# Patient Record
Sex: Female | Born: 1985 | Race: Black or African American | Hispanic: No | Marital: Married | Smoking: Never smoker
Health system: Southern US, Community
[De-identification: ages and names within clinical notes are randomized; demographics above are authoritative.]

## PROBLEM LIST (undated history)

## (undated) DIAGNOSIS — C8190 Hodgkin lymphoma, unspecified, unspecified site: Secondary | ICD-10-CM

## (undated) DIAGNOSIS — R590 Localized enlarged lymph nodes: Secondary | ICD-10-CM

## (undated) DIAGNOSIS — E611 Iron deficiency: Secondary | ICD-10-CM

## (undated) DIAGNOSIS — G4730 Sleep apnea, unspecified: Secondary | ICD-10-CM

## (undated) DIAGNOSIS — R51 Headache: Secondary | ICD-10-CM

## (undated) DIAGNOSIS — C8100 Nodular lymphocyte predominant Hodgkin lymphoma, unspecified site: Secondary | ICD-10-CM

## (undated) DIAGNOSIS — F419 Anxiety disorder, unspecified: Secondary | ICD-10-CM

## (undated) DIAGNOSIS — R0600 Dyspnea, unspecified: Secondary | ICD-10-CM

## (undated) DIAGNOSIS — L732 Hidradenitis suppurativa: Secondary | ICD-10-CM

## (undated) DIAGNOSIS — I10 Essential (primary) hypertension: Secondary | ICD-10-CM

## (undated) HISTORY — PX: OTHER SURGICAL HISTORY: SHX169

## (undated) HISTORY — DX: Iron deficiency: E61.1

## (undated) HISTORY — PX: ADENOIDECTOMY: SHX5191

## (undated) HISTORY — DX: Nodular lymphocyte predominant Hodgkin lymphoma, unspecified site: C81.00

---
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
## 2003-01-03 ENCOUNTER — Ambulatory Visit (HOSPITAL_COMMUNITY): Admission: AD | Admit: 2003-01-03 | Discharge: 2003-01-03 | Payer: Self-pay | Admitting: Obstetrics and Gynecology

## 2003-01-28 ENCOUNTER — Inpatient Hospital Stay (HOSPITAL_COMMUNITY): Admission: AD | Admit: 2003-01-28 | Discharge: 2003-01-31 | Payer: Self-pay | Admitting: Obstetrics and Gynecology

## 2005-03-18 ENCOUNTER — Emergency Department (HOSPITAL_COMMUNITY): Admission: EM | Admit: 2005-03-18 | Discharge: 2005-03-18 | Payer: Self-pay | Admitting: Emergency Medicine

## 2005-05-03 ENCOUNTER — Emergency Department (HOSPITAL_COMMUNITY): Admission: EM | Admit: 2005-05-03 | Discharge: 2005-05-03 | Payer: Self-pay | Admitting: Emergency Medicine

## 2008-04-17 ENCOUNTER — Other Ambulatory Visit: Admission: RE | Admit: 2008-04-17 | Discharge: 2008-04-17 | Payer: Self-pay | Admitting: Obstetrics and Gynecology

## 2009-12-10 ENCOUNTER — Emergency Department (HOSPITAL_COMMUNITY): Admission: EM | Admit: 2009-12-10 | Discharge: 2009-12-10 | Payer: Self-pay | Admitting: Emergency Medicine

## 2010-07-18 DIAGNOSIS — I10 Essential (primary) hypertension: Secondary | ICD-10-CM

## 2010-07-18 HISTORY — DX: Essential (primary) hypertension: I10

## 2010-07-31 ENCOUNTER — Inpatient Hospital Stay (HOSPITAL_COMMUNITY)
Admission: AD | Admit: 2010-07-31 | Discharge: 2010-07-31 | Payer: Self-pay | Source: Home / Self Care | Attending: Obstetrics & Gynecology | Admitting: Obstetrics & Gynecology

## 2010-10-29 LAB — CBC
Hemoglobin: 12.4 g/dL (ref 12.0–15.0)
MCH: 28.4 pg (ref 26.0–34.0)
MCHC: 33.2 g/dL (ref 30.0–36.0)
MCV: 85.5 fL (ref 78.0–100.0)

## 2010-11-20 ENCOUNTER — Inpatient Hospital Stay (INDEPENDENT_AMBULATORY_CARE_PROVIDER_SITE_OTHER)
Admission: RE | Admit: 2010-11-20 | Discharge: 2010-11-20 | Disposition: A | Discharge status: Home / Self Care | Payer: 59 | Source: Ambulatory Visit | Attending: Emergency Medicine | Admitting: Emergency Medicine

## 2010-11-20 DIAGNOSIS — J029 Acute pharyngitis, unspecified: Secondary | ICD-10-CM

## 2010-11-20 DIAGNOSIS — B9789 Other viral agents as the cause of diseases classified elsewhere: Secondary | ICD-10-CM

## 2010-11-20 LAB — POCT RAPID STREP A (OFFICE): Streptococcus, Group A Screen (Direct): NEGATIVE

## 2011-01-03 NOTE — Op Note (Signed)
   NAMELILJA, SOLAND                          ACCOUNT NO.:  [id number]   MEDICAL RECORD NO.:  [id number]                   PATIENT TYPE:  INP   LOCATION:  A427                                 FACILITY:  [facility]   PHYSICIAN:  Tilda Burrow, M.D.              DATE OF BIRTH:  1986/02/14   DATE OF PROCEDURE:  01/29/2003  DATE OF DISCHARGE:                                 OPERATIVE REPORT   DELIVERY TIME:  7:20 p.m. on January 29, 2003.   DELIVERY NOTE:  Ms. Freida Busman progressed slowly through the day after epidural  analgesia achieved excellent relief.  She was able to sleep through some of  the labor.  Pitocin augmentation allowed for good labor.  Difficulty with  assessing the uterine intensity necessitated placement of an intrauterine  pressure catheter.  She progressed nicely, reaching completely dilated  approximately 5:30 p.m., and pushed for over an hour, reaching outlet  position.  She could not quite push the baby out completely, and vacuum  assistance was offered, accepted, and used through one contraction to bring  the baby easily through extension.  There was a small second degree  laceration.  There was a nuchal cord x1.  The left arm was grasped and  rotated anteriorly from its left anterior position and the left arm released  completely, then the baby expelled the rest of the way without difficulty.  The legs were in McRoberts position.  The patient had numerous support  persons in the room.  The cord was clamped and cut by the baby's father,  placenta delivered intact, Tomasa Blase presentation, after a three-vessel cord  confirmed.  Amniotic fluid was without malodor.  Estimated blood loss was  1000 mL.  The uterus continued tendency to ooze, requiring uterine massage,  IV oxytocin, and intramuscular Hemabate.  A medium second-degree laceration  was repaired using figure-of-eight suture of 2-0 Dexon.  The patient  tolerated the procedure well.  The epidural catheter was removed  with the  tip visualized as intact.                                               Tilda Burrow, M.D.    JVF/MEDQ  D:  01/29/2003  T:  01/30/2003  Job:  [id number]   cc:   [facility]

## 2011-01-03 NOTE — Op Note (Signed)
   [name], [name]                          ACCOUNT NO.:  [id number]   MEDICAL RECORD NO.:  [id number]                   PATIENT TYPE:  INP   LOCATION:  LDR1                                 FACILITY:  [facility]   PHYSICIAN:  Tilda Burrow, M.D.              DATE OF BIRTH:  01-16-1986   DATE OF PROCEDURE:  DATE OF DISCHARGE:                                  PROCEDURE NOTE   PROCEDURE:  Epidural catheter placement.   SURGEON:  Tilda Burrow, M.D.   DESCRIPTION OF PROCEDURE:  The patient had epidural catheter placed at L3-4  interspace.  After consent was obtained, the patient was positioned in the  usual sitting position and flexed forward and local anesthesia applied.  We  first attempted at L2-3 interspace without success and could not identify  the access past the bony prominences.  The patient has a marked lumbar  lordosis and so, therefore, the procedure was technically challenging.  The  ankle was quite cephalad for the needle placement, but we were successful on  first attempt at L3-4 interspace, identifying the epidural space at a needle  depth of about 7 cm.   The catheter was threaded 2.5 cm into the epidural space, taped onto the  back and infusion performed as per usual; with the test dose at bolus.  The  patient tolerated the procedure and got excellent analgesic level of C10.                                               Tilda Burrow, M.D.    JVF/MEDQ  D:  01/29/2003  T:  01/29/2003  Job:  [id number]

## 2011-01-03 NOTE — H&P (Signed)
[name], [name]                          ACCOUNT NO.:  [id number]   MEDICAL RECORD NO.:  [id number]                   PATIENT TYPE:  INP   LOCATION:  LDR1                                 FACILITY:  [facility]   PHYSICIAN:  Tilda Burrow, M.D.              DATE OF BIRTH:  [birth year]   DATE OF ADMISSION:  01/28/2003  DATE OF DISCHARGE:                                HISTORY & PHYSICAL   ADMISSION DIAGNOSES:  1. Pregnancy 40-1/[redacted] weeks gestation.  2. Latent phase labor.   HISTORY OF PRESENT ILLNESS:  This 25 year old female, gravida 2, para 0, AB  1, LMP May 11, 2002 placing menstrual [name] February 16, 2003; with  ultrasound corrected [facility] based on 19-week ultrasound as January 26, 2003 with  corresponding later trimester scan.  She is admitted at 40-1/2 weeks by  ultrasound criteria after a pregnancy course followed through our office  since January with 33 pound weight gain and appropriate fundal height  growth.  Prenatal labs documented in the prenatal record and notable for  blood type A positive, UDS negative, hepatitis, HIV, GC, Chlamydia, and RPR  all negative.  MSAFP normal at 1:10,000.  Group B Strep negative and glucose  tolerance test barely abnormal on 1-hour test at 145 mg% with normal 3-hour  test.  She is admitted, in the middle of the night with cervix 3-4 cm, 100%,  minus 2, vertex presentation.  She has progressed slowly, required  analgesics x1 which knocked out her labor.  At this time at 10:30 a.m.  amniotomy is performed revealing clear amniotic fluid; and Pitocin will be  initiated.  Presenting part is at a minus 2 station with cervix well applied  at 2-3 cm, 100% effaced, minus 2 to minus 3 station.   PAST MEDICAL HISTORY:  Positive for asthma.  Not on any medicines at this  time.   SURGICAL HISTORY:  1. Breast reduction 2001.  2. D&C for miscarriage 2002.   ALLERGIES:  None known.   Cigarettes, alcohol, recreational drugs denied.   SOCIAL HISTORY:   Single, supportive father of the baby [name] age  69).  The patient plans to bottle feed.  Is considering IUD for  contraception and will take baby to [facility].   PHYSICAL EXAMINATION:  VITAL SIGNS:  Height 5 feet 1 inch.  Weight 222 which  is a 33 pound weight gain.  ABDOMEN:  Fundal height 41 cm.  PELVIC: Cervix 2-3, 100% minus 2 to minus 3 vertex with amniotomy performed  and scalp electrode in place.  Fetal heart rate tracing showed diminished  beat-to-beat after the analgesics, which has recovered nicely now.  There is  no suspicion of late decelerations and only occasional fetal monitoring  variable, not clinical significant at this time.    IMPRESSION:  1. Pregnancy, term.  2. Latent phase labor, inadequate labor to date.   PLAN:  Pitocin augmentation.  May require epidural for analgesia.                                               Tilda Burrow, M.D.    JVF/MEDQ  D:  01/29/2003  T:  01/29/2003  Job:  [id number]   cc:   [facility]   Francoise Schaumann. Halm, D.O.  [address]  [place]  [place] [address]  Fax: [phone]

## 2011-05-10 ENCOUNTER — Inpatient Hospital Stay (HOSPITAL_COMMUNITY): Admission: AD | Admit: 2011-05-10 | Payer: 59 | Source: Ambulatory Visit | Admitting: Obstetrics & Gynecology

## 2011-06-17 ENCOUNTER — Other Ambulatory Visit (HOSPITAL_COMMUNITY)
Admission: RE | Admit: 2011-06-17 | Discharge: 2011-06-17 | Disposition: A | Discharge status: Home / Self Care | Payer: 59 | Source: Ambulatory Visit | Attending: Obstetrics & Gynecology | Admitting: Obstetrics & Gynecology

## 2011-06-17 ENCOUNTER — Other Ambulatory Visit: Payer: Self-pay | Admitting: Obstetrics & Gynecology

## 2011-06-17 DIAGNOSIS — Z01419 Encounter for gynecological examination (general) (routine) without abnormal findings: Secondary | ICD-10-CM | POA: Insufficient documentation

## 2011-06-17 DIAGNOSIS — Z113 Encounter for screening for infections with a predominantly sexual mode of transmission: Secondary | ICD-10-CM | POA: Insufficient documentation

## 2011-08-19 NOTE — L&D Delivery Note (Signed)
Delivery Note At 6:36 PM a viable female was delivered SVD, with one nuchal cord, tight.  APGAR: 8, 9; weight - to be checked in one hour .   Placenta status: intact , .  Cord:  Three vessel. with the following complications: none.  Cord pH: not obtained.   Anesthesia:  Epidural Episiotomy: not indicated Lacerations: 2: 1st degree anterior lacerations Suture Repair: not needed Est. Blood Loss (mL): 400 cc's  Mom to postpartum.  Baby to nursery-stable.  Edd Arbour MD 12/12/2011, 6:55 PM  I attended the delivery of this patient and precepted Dr Rivka Safer.  I agree with the above note.  Candelaria Celeste JEHIEL 12/13/2011 12:21 AM

## 2011-12-11 ENCOUNTER — Encounter (HOSPITAL_COMMUNITY): Payer: Self-pay | Admitting: *Deleted

## 2011-12-11 ENCOUNTER — Inpatient Hospital Stay (HOSPITAL_COMMUNITY)
Admission: AD | Admit: 2011-12-11 | Discharge: 2011-12-14 | DRG: 774 | Disposition: A | Discharge status: Home / Self Care | Payer: Medicaid Other | Source: Ambulatory Visit | Attending: Obstetrics & Gynecology | Admitting: Obstetrics & Gynecology

## 2011-12-11 DIAGNOSIS — O1410 Severe pre-eclampsia, unspecified trimester: Secondary | ICD-10-CM | POA: Diagnosis present

## 2011-12-11 DIAGNOSIS — I10 Essential (primary) hypertension: Secondary | ICD-10-CM

## 2011-12-11 DIAGNOSIS — IMO0002 Reserved for concepts with insufficient information to code with codable children: Secondary | ICD-10-CM

## 2011-12-11 DIAGNOSIS — O10919 Unspecified pre-existing hypertension complicating pregnancy, unspecified trimester: Secondary | ICD-10-CM | POA: Diagnosis present

## 2011-12-11 HISTORY — DX: Essential (primary) hypertension: I10

## 2011-12-11 HISTORY — DX: Headache: R51

## 2011-12-11 LAB — OB RESULTS CONSOLE ABO/RH: RH Type: POSITIVE

## 2011-12-11 LAB — CBC
HCT: 33.8 % — ABNORMAL LOW (ref 36.0–46.0)
MCH: 25.8 pg — ABNORMAL LOW (ref 26.0–34.0)
MCV: 79.9 fL (ref 78.0–100.0)
RBC: 4.23 MIL/uL (ref 3.87–5.11)
WBC: 13.9 10*3/uL — ABNORMAL HIGH (ref 4.0–10.5)

## 2011-12-11 LAB — OB RESULTS CONSOLE HEPATITIS B SURFACE ANTIGEN: Hepatitis B Surface Ag: NEGATIVE

## 2011-12-11 LAB — OB RESULTS CONSOLE ANTIBODY SCREEN: Antibody Screen: NEGATIVE

## 2011-12-11 MED ORDER — ZOLPIDEM TARTRATE 10 MG PO TABS
10.0000 mg | ORAL_TABLET | Freq: Every evening | ORAL | Status: DC | PRN
  Filled 2011-12-11: qty 1

## 2011-12-11 MED ORDER — CITRIC ACID-SODIUM CITRATE 334-500 MG/5ML PO SOLN: 30.0000 mL | ORAL | Status: DC | PRN

## 2011-12-11 MED ORDER — PHENYLEPHRINE 40 MCG/ML (10ML) SYRINGE FOR IV PUSH (FOR BLOOD PRESSURE SUPPORT)
80.0000 ug | PREFILLED_SYRINGE | INTRAVENOUS | Status: DC | PRN
Start: 2011-12-11 — End: 2011-12-12

## 2011-12-11 MED ORDER — LABETALOL HCL 200 MG PO TABS
200.0000 mg | ORAL_TABLET | Freq: Two times a day (BID) | ORAL | Status: DC
  Administered 2011-12-11 – 2011-12-12 (×2): 200 mg via ORAL
  Filled 2011-12-11 (×4): qty 1

## 2011-12-11 MED ORDER — FENTANYL 2.5 MCG/ML BUPIVACAINE 1/10 % EPIDURAL INFUSION (WH - ANES)
14.0000 mL/h | INTRAMUSCULAR | Status: DC
  Administered 2011-12-12: 14 mL/h via EPIDURAL
  Filled 2011-12-11 (×2): qty 60

## 2011-12-11 MED ORDER — IBUPROFEN 600 MG PO TABS
600.0000 mg | ORAL_TABLET | Freq: Four times a day (QID) | ORAL | Status: DC | PRN
  Administered 2011-12-12: 600 mg via ORAL
  Filled 2011-12-11: qty 1

## 2011-12-11 MED ORDER — MISOPROSTOL 25 MCG QUARTER TABLET
25.0000 ug | ORAL_TABLET | ORAL | Status: DC | PRN
  Administered 2011-12-11 (×2): 25 ug via VAGINAL
  Filled 2011-12-11 (×2): qty 0.25

## 2011-12-11 MED ORDER — PENICILLIN G POTASSIUM 5000000 UNITS IJ SOLR
2.5000 10*6.[IU] | INTRAVENOUS | Status: DC
  Administered 2011-12-12 (×5): 2.5 10*6.[IU] via INTRAVENOUS
  Filled 2011-12-11 (×8): qty 2.5

## 2011-12-11 MED ORDER — TERBUTALINE SULFATE 1 MG/ML IJ SOLN: 0.2500 mg | Freq: Once | INTRAMUSCULAR | Status: AC | PRN

## 2011-12-11 MED ORDER — EPHEDRINE 5 MG/ML INJ: 10.0000 mg | INTRAVENOUS | Status: DC | PRN

## 2011-12-11 MED ORDER — ONDANSETRON HCL 4 MG/2ML IJ SOLN: 4.0000 mg | Freq: Four times a day (QID) | INTRAMUSCULAR | Status: DC | PRN

## 2011-12-11 MED ORDER — OXYTOCIN 20 UNITS IN LACTATED RINGERS INFUSION - SIMPLE
125.0000 mL/h | Freq: Once | INTRAVENOUS | Status: AC
  Administered 2011-12-12: 125 mL/h via INTRAVENOUS

## 2011-12-11 MED ORDER — ACETAMINOPHEN 325 MG PO TABS: 650.0000 mg | ORAL_TABLET | ORAL | Status: DC | PRN

## 2011-12-11 MED ORDER — LIDOCAINE HCL (PF) 1 % IJ SOLN
30.0000 mL | INTRAMUSCULAR | Status: DC | PRN
  Filled 2011-12-11: qty 30

## 2011-12-11 MED ORDER — OXYTOCIN BOLUS FROM INFUSION
500.0000 mL | Freq: Once | INTRAVENOUS | Status: DC
  Filled 2011-12-11: qty 500

## 2011-12-11 MED ORDER — MAGNESIUM SULFATE BOLUS VIA INFUSION
4.0000 g | Freq: Once | INTRAVENOUS | Status: AC
  Administered 2011-12-11: 4 g via INTRAVENOUS
  Filled 2011-12-11: qty 500

## 2011-12-11 MED ORDER — LACTATED RINGERS IV SOLN
INTRAVENOUS | Status: DC
  Administered 2011-12-11 – 2011-12-12 (×2): via INTRAVENOUS

## 2011-12-11 MED ORDER — LACTATED RINGERS IV SOLN
500.0000 mL | Freq: Once | INTRAVENOUS | Status: AC
  Administered 2011-12-12: 1000 mL via INTRAVENOUS

## 2011-12-11 MED ORDER — HYDROXYZINE HCL 50 MG PO TABS: 50.0000 mg | ORAL_TABLET | Freq: Four times a day (QID) | ORAL | Status: DC | PRN

## 2011-12-11 MED ORDER — PENICILLIN G POTASSIUM 5000000 UNITS IJ SOLR
5.0000 10*6.[IU] | Freq: Once | INTRAVENOUS | Status: AC
  Administered 2011-12-11: 5 10*6.[IU] via INTRAVENOUS
  Filled 2011-12-11: qty 5

## 2011-12-11 MED ORDER — FLEET ENEMA 7-19 GM/118ML RE ENEM: 1.0000 | ENEMA | RECTAL | Status: DC | PRN

## 2011-12-11 MED ORDER — DIPHENHYDRAMINE HCL 50 MG/ML IJ SOLN: 12.5000 mg | INTRAMUSCULAR | Status: DC | PRN

## 2011-12-11 MED ORDER — MAGNESIUM SULFATE 40 G IN LACTATED RINGERS - SIMPLE
2.0000 g/h | INTRAVENOUS | Status: DC
  Administered 2011-12-12: 2 g/h via INTRAVENOUS
  Filled 2011-12-11 (×2): qty 500

## 2011-12-11 MED ORDER — EPHEDRINE 5 MG/ML INJ
10.0000 mg | INTRAVENOUS | Status: DC | PRN
  Filled 2011-12-11: qty 4

## 2011-12-11 MED ORDER — OXYCODONE-ACETAMINOPHEN 5-325 MG PO TABS: 1.0000 | ORAL_TABLET | ORAL | Status: DC | PRN

## 2011-12-11 MED ORDER — HYDROXYZINE HCL 50 MG/ML IM SOLN: 50.0000 mg | Freq: Four times a day (QID) | INTRAMUSCULAR | Status: DC | PRN

## 2011-12-11 MED ORDER — LACTATED RINGERS IV SOLN: 500.0000 mL | INTRAVENOUS | Status: DC | PRN

## 2011-12-11 MED ORDER — PHENYLEPHRINE 40 MCG/ML (10ML) SYRINGE FOR IV PUSH (FOR BLOOD PRESSURE SUPPORT)
80.0000 ug | PREFILLED_SYRINGE | INTRAVENOUS | Status: DC | PRN
  Filled 2011-12-11: qty 5

## 2011-12-11 NOTE — H&P (Signed)
Obstetric History and Physical  Arlisa Leclere Buick is a 26 y.o. G3P1011 with IUP at [redacted]w[redacted]d presenting for induction of labor for chronic hypertension with superimposed preeclampsia. Patient states she has been having severe headaches and seeing spots in her vision for a few days, was evaluated in the office today and was noted to have a BP of 140s/90s, and a recent 24  Hour urine protein was 357 mg.  Given the concern for severe preeclampsia, the plan was to admit her for induction of labor.  She denies any contractions, LOF, VB, RUQ pain and endorses good fetal movement.  Prenatal Course Source of Care: [facility] with onset of care at 7 weeks Pregnancy complications or risks: Patient Active Problem List  Diagnoses  . Hypertension in pregnancy, preeclampsia, severe, antepartum  . Chronic hypertension complicating or reason for care during pregnancy   She plans to breastfeed She is undecided for postpartum contraception.   Prenatal labs and studies: ABO, Rh: A/Positive/-- (04/25 1841) Antibody: Negative (04/25 1841) Rubella: Immune (04/25 1841) RPR: Nonreactive (04/25 1841)  HBsAg: Negative (04/25 1841)  HIV: Non-reactive (04/25 1841)  GBS: unknown 2 hr Glucola   71-121-78 Genetic screening normal Anatomy US normal  Past Medical History:  Past Medical History  Diagnosis Date  . Hypertension December 2011    Been on Aldomet since Dec. 2011  . Headache     usually controlled c Tylenol when not pregnant, but more painful headaches  during preg. due to hypertension.  . Asthma     had since childhood; has not had to use inhaler or nebulizer for 8-9 years. Been doing well.   Past Surgical History:  Past Surgical History  Procedure Date  . Breast reconstruction December 2001    breast reduction at age 33  . Adenoidectomy     age 69   Obstetrical History:  OB History    Grav Para Term Preterm Abortions TAB SAB Ect Mult Living   3 1 1  0 1 0 1 0 0 1     Gynecological History:   Patient denies any cervical dysplasia or STIs.  Social History:  History   Social History  . Marital Status: Married    Spouse Name: N/A    Number of Children: N/A  . Years of Education: N/A   Social History Main Topics  . Smoking status: Never Smoker   . Smokeless tobacco: Never Used  . Alcohol Use: No  . Drug Use: No  . Sexually Active: Yes   Other Topics Concern  . None   Social History Narrative  . None   Family History: History reviewed. No pertinent family history.  Medications:  Prenatal vitamins  Allergies: No Known Allergies  Review of Systems: Negative except for what is mentioned in HPI.  Physical Exam: Blood pressure 142/80, pulse 117, temperature 98.1 F (36.7 C), temperature source Oral, resp. rate 20, height 5\' 3"  (1.6 m), weight 122.925 kg (271 lb), last menstrual period 03/27/2011. GENERAL: Well-developed, well-nourished female in no acute distress.  LUNGS: Clear to auscultation bilaterally.  HEART: Regular rate and rhythm. ABDOMEN: Soft, nontender, nondistended, gravid. EXTREMITIES: Nontender, no edema, 2+DTRs Cervical Exam: Dilatation 0cm   Effacement 0%   Station high   Presentation: cephalic FHT:  Baseline rate 150 bpm   Variability moderate  Accelerations present   Decelerations none Contractions: None   Pertinent Labs/Studies:   CBC     Status: Abnormal   12/11/11  7:15 PM      Component  Value Range   WBC 13.9 (*) 4.0 - 10.5 (K/uL)   RBC 4.23  3.87 - 5.11 (MIL/uL)   Hemoglobin 10.9 (*) 12.0 - 15.0 (g/dL)   HCT 01.0 (*) 27.2 - 46.0 (%)   MCV 79.9  78.0 - 100.0 (fL)   MCH 25.8 (*) 26.0 - 34.0 (pg)   MCHC 32.2  30.0 - 36.0 (g/dL)   RDW 53.6  64.4 - 03.4 (%)   Platelets 351  150 - 400 (K/uL)   Assessment : ILARIA MUCH is a 26 y.o. G3P1011 at [redacted]w[redacted]d being admitted for induction of labor for chronic hypertension with superimposed preeclampsia.  Plan: Labor:  Induction as per protocol with misoprostol, foley bulb, pitocin  etc Preeclampsia: Will start magnesium sulfate eclampsia prophylaxis and give further antihypertensives as needed. FWB: Reassuring fetal heart tracing.  GBS unknown; will treat with PCN for prematurity Delivery plan: Hopeful for vaginal delivery   Jaynie Collins, M.D. 12/11/2011 6:30 PM

## 2011-12-12 ENCOUNTER — Encounter (HOSPITAL_COMMUNITY): Payer: Self-pay | Admitting: Anesthesiology

## 2011-12-12 ENCOUNTER — Inpatient Hospital Stay (HOSPITAL_COMMUNITY): Payer: Medicaid Other | Admitting: Anesthesiology

## 2011-12-12 ENCOUNTER — Encounter (HOSPITAL_COMMUNITY): Payer: Self-pay | Admitting: *Deleted

## 2011-12-12 LAB — CBC
Hemoglobin: 11.2 g/dL — ABNORMAL LOW (ref 12.0–15.0)
Hemoglobin: 11.2 g/dL — ABNORMAL LOW (ref 12.0–15.0)
MCH: 25.6 pg — ABNORMAL LOW (ref 26.0–34.0)
MCHC: 33.1 g/dL (ref 30.0–36.0)
MCV: 80.1 fL (ref 78.0–100.0)
Platelets: 359 10*3/uL (ref 150–400)
Platelets: 385 10*3/uL (ref 150–400)
RBC: 4.38 MIL/uL (ref 3.87–5.11)
RDW: 15 % (ref 11.5–15.5)
WBC: 13.9 10*3/uL — ABNORMAL HIGH (ref 4.0–10.5)

## 2011-12-12 LAB — MRSA PCR SCREENING: MRSA by PCR: NEGATIVE

## 2011-12-12 MED ORDER — DIBUCAINE 1 % RE OINT: 1.0000 "application " | TOPICAL_OINTMENT | RECTAL | Status: DC | PRN

## 2011-12-12 MED ORDER — HYDRALAZINE HCL 20 MG/ML IJ SOLN: 10.0000 mg | Freq: Four times a day (QID) | INTRAMUSCULAR | Status: DC | PRN

## 2011-12-12 MED ORDER — PRENATAL MULTIVITAMIN CH
1.0000 | ORAL_TABLET | Freq: Every day | ORAL | Status: DC
  Administered 2011-12-13 – 2011-12-14 (×2): 1 via ORAL
  Filled 2011-12-12 (×2): qty 1

## 2011-12-12 MED ORDER — BENZOCAINE-MENTHOL 20-0.5 % EX AERO: 1.0000 "application " | INHALATION_SPRAY | CUTANEOUS | Status: DC | PRN

## 2011-12-12 MED ORDER — ZOLPIDEM TARTRATE 10 MG PO TABS
10.0000 mg | ORAL_TABLET | Freq: Every evening | ORAL | Status: DC | PRN
  Administered 2011-12-12: 10 mg via ORAL

## 2011-12-12 MED ORDER — ONDANSETRON HCL 4 MG PO TABS: 4.0000 mg | ORAL_TABLET | ORAL | Status: DC | PRN

## 2011-12-12 MED ORDER — OXYTOCIN 20 UNITS IN LACTATED RINGERS INFUSION - SIMPLE
1.0000 m[IU]/min | INTRAVENOUS | Status: DC
  Administered 2011-12-12: 2 m[IU]/min via INTRAVENOUS
  Filled 2011-12-12: qty 1000

## 2011-12-12 MED ORDER — WITCH HAZEL-GLYCERIN EX PADS: 1.0000 "application " | MEDICATED_PAD | CUTANEOUS | Status: DC | PRN

## 2011-12-12 MED ORDER — ONDANSETRON HCL 4 MG/2ML IJ SOLN: 4.0000 mg | INTRAMUSCULAR | Status: DC | PRN

## 2011-12-12 MED ORDER — DIPHENHYDRAMINE HCL 25 MG PO CAPS: 25.0000 mg | ORAL_CAPSULE | Freq: Four times a day (QID) | ORAL | Status: DC | PRN

## 2011-12-12 MED ORDER — IBUPROFEN 600 MG PO TABS
600.0000 mg | ORAL_TABLET | Freq: Four times a day (QID) | ORAL | Status: DC
  Administered 2011-12-12 – 2011-12-14 (×8): 600 mg via ORAL
  Filled 2011-12-12 (×8): qty 1

## 2011-12-12 MED ORDER — SENNOSIDES-DOCUSATE SODIUM 8.6-50 MG PO TABS
2.0000 | ORAL_TABLET | Freq: Every day | ORAL | Status: DC
  Administered 2011-12-13: 2 via ORAL

## 2011-12-12 MED ORDER — LABETALOL HCL 200 MG PO TABS
200.0000 mg | ORAL_TABLET | Freq: Two times a day (BID) | ORAL | Status: DC
  Administered 2011-12-12: 200 mg via ORAL
  Filled 2011-12-12 (×2): qty 1

## 2011-12-12 MED ORDER — FENTANYL 2.5 MCG/ML BUPIVACAINE 1/10 % EPIDURAL INFUSION (WH - ANES)
INTRAMUSCULAR | Status: DC | PRN
  Administered 2011-12-12: 14 mL/h via EPIDURAL

## 2011-12-12 MED ORDER — ZOLPIDEM TARTRATE 5 MG PO TABS: 5.0000 mg | ORAL_TABLET | Freq: Every evening | ORAL | Status: DC | PRN

## 2011-12-12 MED ORDER — OXYCODONE-ACETAMINOPHEN 5-325 MG PO TABS
1.0000 | ORAL_TABLET | ORAL | Status: DC | PRN
  Administered 2011-12-12 – 2011-12-14 (×3): 1 via ORAL
  Filled 2011-12-12 (×3): qty 1

## 2011-12-12 MED ORDER — SIMETHICONE 80 MG PO CHEW: 80.0000 mg | CHEWABLE_TABLET | ORAL | Status: DC | PRN

## 2011-12-12 MED ORDER — TERBUTALINE SULFATE 1 MG/ML IJ SOLN: 0.2500 mg | Freq: Once | INTRAMUSCULAR | Status: DC | PRN

## 2011-12-12 MED ORDER — TETANUS-DIPHTH-ACELL PERTUSSIS 5-2.5-18.5 LF-MCG/0.5 IM SUSP
0.5000 mL | Freq: Once | INTRAMUSCULAR | Status: AC
  Administered 2011-12-13: 0.5 mL via INTRAMUSCULAR
  Filled 2011-12-12: qty 0.5

## 2011-12-12 MED ORDER — PRENATAL MULTIVITAMIN CH: 1.0000 | ORAL_TABLET | Freq: Every day | ORAL | Status: DC

## 2011-12-12 MED ORDER — FENTANYL CITRATE 0.05 MG/ML IJ SOLN
50.0000 ug | INTRAMUSCULAR | Status: DC | PRN
  Administered 2011-12-12 (×3): 50 ug via INTRAVENOUS
  Filled 2011-12-12 (×3): qty 2

## 2011-12-12 MED ORDER — LANOLIN HYDROUS EX OINT: TOPICAL_OINTMENT | CUTANEOUS | Status: DC | PRN

## 2011-12-12 MED ORDER — LIDOCAINE HCL (PF) 1 % IJ SOLN
INTRAMUSCULAR | Status: DC | PRN
  Administered 2011-12-12 (×2): 4 mL

## 2011-12-12 NOTE — Progress Notes (Signed)
[name] is a 26 y.o. G3P1011 at [redacted]w[redacted]d admitted for induction of labor due to Pre-eclamptic toxemia of pregnancy..  Subjective: Doing well. S/p epidural/arom x2 No complaints.   Objective: BP 121/71  Pulse 97  Temp(Src) 97.5 F (36.4 C) (Oral)  Resp 18  Ht 5\' 3"  (1.6 m)  Wt 122.925 kg (271 lb)  BMI 48.01 kg/m2  SpO2 96%  LMP 03/27/2011 I/O last 3 completed shifts: In: 2801.3 [P.O.:970; I.V.:1581.3; IV Piggyback:250] Out: 1475 [Urine:1475] Total I/O In: 1724.4 [P.O.:510; I.V.:1214.4] Out: 1075 [Urine:1075]  FHT:  FHR: 140 bpm, variability: moderate,  accelerations:  Present,  decelerations:  Absent UC:   regular, every 3 minutes SVE:   Dilation: 8 Effacement (%): 90 Station: 0 Exam by:: Dr. Rivka Safer AROM 12:00 pm Clear fluid.  Palpated a second bulging bag, repeat AROM at 4:19 pm - a lot more fluid came out this time, palpated head well.  Clear fluid.  Reflexes: 1+ left radial.  Labs: Lab Results  Component Value Date   WBC 13.9* 12/12/2011   HGB 11.2* 12/12/2011   HCT 35.1* 12/12/2011   MCV 80.1 12/12/2011   PLT 359 12/12/2011    Assessment / Plan: Induction of labor due to preeclampsia,  progressing well on pitocin  Labor: Progressing on Pitocin, s/p arom 4 hours, with repeat arom for second bag.  Preeclampsia:  no signs or symptoms of toxicity Fetal Wellbeing:  Category I Pain Control:  Epidural I/D:  n/a Anticipated MOD:  NSVD  [name] MD 12/12/2011, 4:22 PM

## 2011-12-12 NOTE — Progress Notes (Signed)
[name] is a 26 y.o. G3P1011 at [redacted]w[redacted]d admitted for induction of labor due to Pre-eclamptic toxemia of pregnancy.  Subjective: Pressure  Objective: BP 127/85  Pulse 103  Temp(Src) 97.5 F (36.4 C) (Oral)  Resp 18  Ht 5\' 3"  (1.6 m)  Wt 122.925 kg (271 lb)  BMI 48.01 kg/m2  SpO2 96%  LMP 03/27/2011 I/O last 3 completed shifts: In: 2801.3 [P.O.:970; I.V.:1581.3; IV Piggyback:250] Out: 1475 [Urine:1475] Total I/O In: 2034.4 [P.O.:630; I.V.:1404.4] Out: 1400 [Urine:1400]  FHT:  FHR: 140 bpm, variability: moderate,  accelerations:  Present,  decelerations:  Absent UC:   regular, every 3 minutes SVE:    Dilation: complete Effacement (%): 100 Cervical Position: Anterior Station: +1 Presentation: Vertex Exam by:: Dr. Rivka Safer  AROM 12:00/4:00 pm Clear fluid.  Labs: Lab Results  Component Value Date   WBC 13.9* 12/12/2011   HGB 11.2* 12/12/2011   HCT 35.1* 12/12/2011   MCV 80.1 12/12/2011   PLT 359 12/12/2011    Assessment / Plan: Induction of labor due to preeclampsia,  progressing well on pitocin  Labor: Progressing on Pitocin, s/p arom 4 hours, with repeat arom for second bag.  Now starting to push.  Preeclampsia:  no signs or symptoms of toxicity Fetal Wellbeing:  Category I Pain Control:  Epidural I/D:  n/a Anticipated MOD:  NSVD  Cervix complete - Nurse Erskine Squibb pushing - when closer will head back to room for delivery.   Edd Arbour MD 12/12/2011, 6:17 PM

## 2011-12-12 NOTE — Progress Notes (Signed)
Delivery Note At 6:36 PM a viable female was delivered SVD, with one nuchal cord, tight.  APGAR: 8, 9; weight - to be checked in one hour .   Placenta status: intact , .  Cord:  Three vessel. with the following complications: none.  Cord pH: not obtained.   Anesthesia:  Epidural Episiotomy: not indicated Lacerations: 2: 1st degree anterior lacerations Suture Repair: not needed Est. Blood Loss (mL): 400 cc's  Mom to postpartum.  Baby to nursery-stable.  Edd Arbour MD 12/12/2011, 6:55 PM

## 2011-12-12 NOTE — Anesthesia Procedure Notes (Signed)
Epidural Patient location during procedure: OB Start time: 12/12/2011 11:09 AM  Staffing Anesthesiologist: Elly Haffey A. Performed by: anesthesiologist   Preanesthetic Checklist Completed: patient identified, site marked, surgical consent, pre-op evaluation, timeout performed, IV checked, risks and benefits discussed and monitors and equipment checked  Epidural Patient position: sitting Prep: site prepped and draped and DuraPrep Patient monitoring: continuous pulse ox and blood pressure Approach: midline Injection technique: LOR air  Needle:  Needle type: Tuohy  Needle gauge: 17 G Needle length: 9 cm Needle insertion depth: 8 cm Catheter type: closed end flexible Catheter size: 19 Gauge Catheter at skin depth: 13 cm Test dose: negative and Other  Assessment Events: blood not aspirated, injection not painful, no injection resistance, negative IV test and no paresthesia  Additional Notes Patient identified. Risks and benefits discussed including failed block, incomplete  Pain control, post dural puncture headache, nerve damage, paralysis, blood pressure Changes, nausea, vomiting, reactions to medications-both toxic and allergic and post Partum back pain. All questions were answered. Patient expressed understanding and wished to proceed. Sterile technique was used throughout procedure. Epidural site was Dressed with sterile barrier dressing. No paresthesias, signs of intravascular injection Or signs of intrathecal spread were encountered. Attempt x 2. Difficult due to poor positioning and MO. Patient was more comfortable after the epidural was dosed. Please see RN's note for documentation of vital signs and FHR which are stable.

## 2011-12-12 NOTE — Progress Notes (Signed)
   [name] is a 26 y.o. G3P1011 at [redacted]w[redacted]d  admitted for induction of labor due to Pre-eclamptic toxemia of pregnancy..  Subjective:  No c/o Objective: BP 131/68  Pulse 104  Temp(Src) 98.1 F (36.7 C) (Oral)  Resp 20  Ht 5\' 3"  (1.6 m)  Wt 122.925 kg (271 lb)  BMI 48.01 kg/m2  LMP 03/27/2011 Total I/O In: 1426.3 [P.O.:570; I.V.:606.3; IV Piggyback:250] Out: 475 [Urine:475]  FHT:  FHR: 140 bpm, variability: moderate,  accelerations:  Present,  decelerations:  Absent UC:   none SVE:   Dilation: 1.5 Effacement (%): 50 Station: -3;-2 Exam by:: LCarpenter,RN  Labs: Lab Results  Component Value Date   WBC 13.9* 12/11/2011   HGB 10.9* 12/11/2011   HCT 33.8* 12/11/2011   MCV 79.9 12/11/2011   PLT 351 12/11/2011    Assessment / Plan: ripening phase  Labor: ripening Fetal Wellbeing:  Category I Pain Control:  Labor support without medications Anticipated MOD:  NSVD  [name] 12/12/2011, 12:22 AM

## 2011-12-12 NOTE — Progress Notes (Signed)
[name] is a 26 y.o. G3P1011 at [redacted]w[redacted]d admitted for induction of labor due to Pre-eclamptic toxemia of pregnancy..  Subjective: Doing well. S/p epidural/arom No complaints.   Objective: BP 118/81  Pulse 95  Temp(Src) 97.4 F (36.3 C) (Oral)  Resp 20  Ht 5\' 3"  (1.6 m)  Wt 122.925 kg (271 lb)  BMI 48.01 kg/m2  SpO2 96%  LMP 03/27/2011 I/O last 3 completed shifts: In: 2801.3 [P.O.:970; I.V.:1581.3; IV Piggyback:250] Out: 1475 [Urine:1475] Total I/O In: 1151.7 [P.O.:330; I.V.:821.7] Out: 825 [Urine:825]  FHT:  FHR: 140 bpm, variability: moderate,  accelerations:  Present,  decelerations:  Absent UC:   regular, every 3 minutes SVE:   Dilation: 4 Effacement (%): 100 Station: -2 Exam by:: Enis Slipper, RN AROM 12:00 pm Clear fluid.  Reflexes: 1+ right radial.  Labs: Lab Results  Component Value Date   WBC 13.9* 12/12/2011   HGB 11.2* 12/12/2011   HCT 35.1* 12/12/2011   MCV 80.1 12/12/2011   PLT 359 12/12/2011    Assessment / Plan: Induction of labor due to preeclampsia,  progressing well on pitocin  Labor: Progressing on Pitocin, s/p arom 2 hours.  Preeclampsia:  no signs or symptoms of toxicity Fetal Wellbeing:  Category I Pain Control:  Epidural I/D:  n/a Anticipated MOD:  NSVD  Joeann Steppe MD 12/12/2011, 2:10 PM

## 2011-12-12 NOTE — Progress Notes (Signed)
CIARRAH RAE is a 26 y.o. G3P1011 at [redacted]w[redacted]d admitted for induction of labor due to Pre-eclamptic toxemia of pregnancy..  Subjective: Doing well. S/p epidural. No complaints.   Objective: BP 102/53  Pulse 98  Temp(Src) 97.4 F (36.3 C) (Oral)  Resp 22  Ht 5\' 3"  (1.6 m)  Wt 122.925 kg (271 lb)  BMI 48.01 kg/m2  SpO2 96%  LMP 03/27/2011 I/O last 3 completed shifts: In: 2801.3 [P.O.:970; I.V.:1581.3; IV Piggyback:250] Out: 1475 [Urine:1475] Total I/O In: 777.9 [P.O.:330; I.V.:447.9] Out: 675 [Urine:675]  FHT:  FHR: 140 bpm, variability: moderate,  accelerations:  Present,  decelerations:  Absent UC:   regular, every 3 minutes SVE:   Dilation: 3 Effacement (%): 90 Station: -3 Exam by:: Dr. Rivka Safer AROM 12:00 pm Clear fluid.  Labs: Lab Results  Component Value Date   WBC 13.9* 12/12/2011   HGB 11.2* 12/12/2011   HCT 35.1* 12/12/2011   MCV 80.1 12/12/2011   PLT 359 12/12/2011    Assessment / Plan: Induction of labor due to preeclampsia,  progressing well on pitocin  Labor: Progressing on Pitocin, s/p AROM Preeclampsia:  no signs or symptoms of toxicity Fetal Wellbeing:  Category I Pain Control:  Epidural I/D:  n/a Anticipated MOD:  NSVD  Xaviera Flaten MD 12/12/2011, 12:03 PM  Saw patient with Dr. Adrian Blackwater

## 2011-12-12 NOTE — Anesthesia Preprocedure Evaluation (Signed)
Anesthesia Evaluation  Patient identified by MRN, date of birth, ID band Patient awake    Reviewed: Allergy & Precautions, H&P , Patient's Chart, lab work & pertinent test results  Airway Mallampati: III TM Distance: >3 FB Neck ROM: full    Dental No notable dental hx. (+) Teeth Intact   Pulmonary asthma ,  breath sounds clear to auscultation  Pulmonary exam normal       Cardiovascular hypertension, Pt. on medications and Pt. on home beta blockers Rhythm:regular Rate:Normal     Neuro/Psych  Headaches, negative psych ROS   GI/Hepatic negative GI ROS, Neg liver ROS,   Endo/Other  Morbid obesity  Renal/GU negative Renal ROS  negative genitourinary   Musculoskeletal   Abdominal Normal abdominal exam  (+)   Peds  Hematology negative hematology ROS (+)   Anesthesia Other Findings   Reproductive/Obstetrics (+) Pregnancy                           Anesthesia Physical Anesthesia Plan  ASA: III  Anesthesia Plan: Epidural   Post-op Pain Management:    Induction:   Airway Management Planned:   Additional Equipment:   Intra-op Plan:   Post-operative Plan:   Informed Consent: I have reviewed the patients History and Physical, chart, labs and discussed the procedure including the risks, benefits and alternatives for the proposed anesthesia with the patient or authorized representative who has indicated his/her understanding and acceptance.     Plan Discussed with: Anesthesiologist  Anesthesia Plan Comments:         Anesthesia Quick Evaluation

## 2011-12-12 NOTE — Progress Notes (Signed)
[name] is a 26 y.o. G3P1011 at [redacted]w[redacted]d admitted for induction of labor due to Pre-eclamptic toxemia of pregnancy.  Subjective: Pressure  Objective: BP 127/84  Pulse 103  Temp(Src) 97.5 F (36.4 C) (Oral)  Resp 18  Ht 5\' 3"  (1.6 m)  Wt 122.925 kg (271 lb)  BMI 48.01 kg/m2  SpO2 96%  LMP 03/27/2011 I/O last 3 completed shifts: In: 2801.3 [P.O.:970; I.V.:1581.3; IV Piggyback:250] Out: 1475 [Urine:1475] Total I/O In: 1919.4 [P.O.:570; I.V.:1349.4] Out: 1075 [Urine:1075]  FHT:  FHR: 140 bpm, variability: moderate,  accelerations:  Present,  decelerations:  Absent UC:   regular, every 3 minutes SVE:    Dilation: Lip/rim Effacement (%): 100 Cervical Position: Anterior Station: +1 Presentation: Vertex Exam by:: Dr. Rivka Safer  AROM 12:00/4:00 pm Clear fluid.  Labs: Lab Results  Component Value Date   WBC 13.9* 12/12/2011   HGB 11.2* 12/12/2011   HCT 35.1* 12/12/2011   MCV 80.1 12/12/2011   PLT 359 12/12/2011    Assessment / Plan: Induction of labor due to preeclampsia,  progressing well on pitocin  Labor: Progressing on Pitocin, s/p arom 4 hours, with repeat arom for second bag.  Preeclampsia:  no signs or symptoms of toxicity Fetal Wellbeing:  Category I Pain Control:  Epidural I/D:  n/a Anticipated MOD:  NSVD Will recheck again in 30 minutes, still has anterior lip that would be better to reduce.  Edd Arbour MD 12/12/2011, 5:12 PM

## 2011-12-12 NOTE — Progress Notes (Signed)
   [name] is a 26 y.o. G3P1011 at [redacted]w[redacted]d  admitted for induction of labor due to Pre-eclamptic toxemia of pregnancy..  Subjective:  No complaints.  HA is mild now Objective: BP 131/68  Pulse 104  Temp(Src) 98.1 F (36.7 C) (Oral)  Resp 20  Ht 5\' 3"  (1.6 m)  Wt 122.925 kg (271 lb)  BMI 48.01 kg/m2  LMP 03/27/2011 Total I/O In: 1426.3 [P.O.:570; I.V.:606.3; IV Piggyback:250] Out: 475 [Urine:475]  FHT:  FHR: 130 bpm, variability: moderate,  accelerations:  Present,  decelerations:  Absent UC:   none SVE:  Closed, thick, soft Labs: Lab Results  Component Value Date   WBC 13.9* 12/11/2011   HGB 10.9* 12/11/2011   HCT 33.8* 12/11/2011   MCV 79.9 12/11/2011   PLT 351 12/11/2011    Assessment / Plan: IOL forCHTN, superimposed severe preeclampsia  cytotec ripening  Labor: ripeing Fetal Wellbeing:  Category I Pain Control:  Labor support without medications Anticipated MOD:  NSVD  CRESENZO-DISHMAN,Deval Mroczka 12/12/2011, 12:19 AM

## 2011-12-12 NOTE — Progress Notes (Signed)
   [name] is a 26 y.o. G3P1011 at [redacted]w[redacted]d  admitted for induction of labor due to Pre-eclamptic toxemia of pregnancy..  Subjective: Desires pain meds  Objective: BP 131/83  Pulse 105  Temp(Src) 98.3 F (36.8 C) (Oral)  Resp 20  Ht 5\' 3"  (1.6 m)  Wt 122.925 kg (271 lb)  BMI 48.01 kg/m2  SpO2 92%  LMP 03/27/2011 Total I/O In: 2246.3 [P.O.:790; I.V.:1206.3; IV Piggyback:250] Out: 1275 [Urine:1275]  FHT:  FHR: 140 bpm, variability: moderate,  accelerations:  Present,  decelerations:  Absent UC:   irregular, every 2-4 minutes SVE:   2-3/50/-3  Labs: Lab Results  Component Value Date   WBC 13.9* 12/11/2011   HGB 10.9* 12/11/2011   HCT 33.8* 12/11/2011   MCV 79.9 12/11/2011   PLT 351 12/11/2011    Assessment / Plan: IOL for CHTN with superimposed preeclampsia, ripening stage complete; will allow to shower, then start Pitocin  Labor: ripening phase complete Fetal Wellbeing:  Category I Pain Control:  Fentanyl Anticipated MOD:  NSVD  CRESENZO-DISHMAN,[place] Duerst 12/12/2011, 4:31 AM

## 2011-12-13 LAB — COMPREHENSIVE METABOLIC PANEL
AST: 14 U/L (ref 0–37)
Alkaline Phosphatase: 122 U/L — ABNORMAL HIGH (ref 39–117)
BUN: 9 mg/dL (ref 6–23)
CO2: 23 mEq/L (ref 19–32)
Chloride: 102 mEq/L (ref 96–112)
Creatinine, Ser: 0.55 mg/dL (ref 0.50–1.10)
GFR calc non Af Amer: >90 mL/min (ref >90)
Potassium: 4.2 mEq/L (ref 3.5–5.1)
Total Bilirubin: 0.1 mg/dL — ABNORMAL LOW (ref 0.3–1.2)

## 2011-12-13 LAB — CBC
HCT: 31.3 % — ABNORMAL LOW (ref 36.0–46.0)
MCV: 80.7 fL (ref 78.0–100.0)
RBC: 3.88 MIL/uL (ref 3.87–5.11)
WBC: 17.8 10*3/uL — ABNORMAL HIGH (ref 4.0–10.5)

## 2011-12-13 MED ORDER — SODIUM CHLORIDE 0.9 % IJ SOLN
3.0000 mL | Freq: Two times a day (BID) | INTRAMUSCULAR | Status: DC
  Administered 2011-12-13: 3 mL via INTRAVENOUS

## 2011-12-13 MED ORDER — MAGNESIUM SULFATE 40 G IN LACTATED RINGERS - SIMPLE
2.0000 g/h | INTRAVENOUS | Status: AC
  Administered 2011-12-13: 2 g/h via INTRAVENOUS
  Filled 2011-12-13: qty 500

## 2011-12-13 MED ORDER — LACTATED RINGERS IV SOLN
INTRAVENOUS | Status: DC
  Administered 2011-12-13: via INTRAVENOUS

## 2011-12-13 MED ORDER — LACTATED RINGERS IV SOLN
INTRAVENOUS | Status: AC
  Administered 2011-12-13: 11:00:00 via INTRAVENOUS

## 2011-12-13 MED ORDER — SODIUM CHLORIDE 0.9 % IJ SOLN
3.0000 mL | INTRAMUSCULAR | Status: DC | PRN
  Administered 2011-12-13: 3 mL via INTRAVENOUS

## 2011-12-13 NOTE — Progress Notes (Signed)
Post Partum Day 1 Subjective: no complaints, up ad lib, voiding and tolerating PO  Objective: Blood pressure 99/49, pulse 90, temperature 97.6 F (36.4 C), temperature source Oral, resp. rate 20, height 5\' 3"  (1.6 m), weight 118.888 kg (262 lb 1.6 oz), last menstrual period 03/27/2011, SpO2 98.00%, unknown if currently breastfeeding.  Physical Exam:  General: alert, cooperative and no distress Lochia: appropriate Uterine Fundus: firm DVT Evaluation: No evidence of DVT seen on physical exam. Negative Homan's sign. No cords or calf tenderness.   Basename 12/12/11 1945 12/12/11 1025  HGB 11.2* 11.2*  HCT 33.8* 35.1*    Assessment/Plan: Plan for discharge tomorrow, Breastfeeding and Lactation consult.  Will d/c labetalol as BP on low side.  Good diuresis.  Transfer orders written when Magnesium d/c'd after 7pm.   LOS: 2 days   [name] JEHIEL 12/13/2011, 7:50 AM

## 2011-12-13 NOTE — Anesthesia Postprocedure Evaluation (Signed)
  Anesthesia Post-op Note  Patient: [name]  Procedure(s) Performed: * No procedures listed *  Patient Location: PACU and A-ICU  Anesthesia Type: Epidural  Level of Consciousness: awake, alert  and oriented  Airway and Oxygen Therapy: Patient Spontanous Breathing  Post-op Pain: none  Post-op Assessment: Post-op Vital signs reviewed and Patient's Cardiovascular Status Stable  Post-op Vital Signs: Reviewed and stable  Complications: No apparent anesthesia complications

## 2011-12-13 NOTE — Progress Notes (Signed)
Pt transferred to [facility] Unit rm 309; pt ambulated to new room without difficulty, Infant with mother and FOB.

## 2011-12-14 LAB — CULTURE, BETA STREP (GROUP B ONLY)

## 2011-12-14 MED ORDER — IBUPROFEN 600 MG PO TABS: 600.0000 mg | ORAL_TABLET | Freq: Four times a day (QID) | ORAL | Status: AC

## 2011-12-14 NOTE — Discharge Summary (Signed)
Obstetric Discharge Summary Reason for Admission: induction of labor Prenatal Procedures: ultrasound Intrapartum Procedures: spontaneous vaginal delivery Postpartum Procedures: magnesium sulfate Complications-Operative and Postpartum: none Hemoglobin  Date Value Range Status  12/13/2011 10.0* 12.0-15.0 (g/dL) Final     HCT  Date Value Range Status  12/13/2011 31.3* 36.0-46.0 (%) Final    Physical Exam:  General: alert Lochia: appropriate Uterine Fundus: firm Incision: n/a DVT Evaluation: No evidence of DVT seen on physical exam.  Discharge Diagnoses: Preelampsia  Discharge Information: Date: 12/14/2011 Activity: pelvic rest Diet: routine Medications: Ibuprofen, labetolol, aldomet Condition: improved Instructions: refer to practice specific booklet Discharge to: home Follow-up Information    Call Tilda Burrow, MD. (She already has appt scheduled)    Contact information:   [facility] [address], [address] [place] [address] [phone]          Newborn Data: Live born female  Birth Weight: 5 lb 14 oz (2665 g) APGAR: 8, 9  Home with observation for 72 hours pp.  Espiridion Supinski C. 12/14/2011, 8:53 AM

## 2011-12-14 NOTE — Progress Notes (Signed)
Patient ID: [name], female   DOB: 1985/12/15, 26 y.o.   MRN: [id number]   S. Feels ready to go home. Normal lochia. No complaints. Already has PP visit scheduled at [facility] and plans to get another Mirena (She has had 2 in the past).  O. VSS, AF      Uterus firm at U-1  A/P. PPD #2- doing well. Discharge home later today. Follow up as above.

## 2011-12-14 NOTE — Discharge Instructions (Addendum)
Arterial Hypertension Arterial hypertension (high blood pressure) is a condition of elevated pressure in your blood vessels. Hypertension over a long period of time is a risk factor for strokes, heart attacks, and heart failure. It is also the leading cause of kidney (renal) failure.  CAUSES   In Adults -- Over 90% of all hypertension has no known cause. This is called essential or primary hypertension. In the other 10% of people with hypertension, the increase in blood pressure is caused by another disorder. This is called secondary hypertension. Important causes of secondary hypertension are:   Heavy alcohol use.   Obstructive sleep apnea.   Hyperaldosterosim (Conn's syndrome).   Steroid use.   Chronic kidney failure.   Hyperparathyroidism.   Medications.   Renal artery stenosis.   Pheochromocytoma.   Cushing's disease.   Coarctation of the aorta.   Scleroderma renal crisis.   Licorice (in excessive amounts).   Drugs (cocaine, methamphetamine).  Your caregiver can explain any items above that apply to you.  In Children -- Secondary hypertension is more common and should always be considered.   Pregnancy -- Few women of childbearing age have high blood pressure. However, up to 10% of them develop hypertension of pregnancy. Generally, this will not harm the woman. It Even be a sign of 3 complications of pregnancy: preeclampsia, HELLP syndrome, and eclampsia. Follow up and control with medication is necessary.  SYMPTOMS   This condition normally does not produce any noticeable symptoms. It is usually found during a routine exam.   Malignant hypertension is a late problem of high blood pressure. It Monger have the following symptoms:   Headaches.   Blurred vision.   End-organ damage (this means your kidneys, heart, lungs, and other organs are being damaged).   Stressful situations can increase the blood pressure. If a person with normal blood pressure has their blood  pressure go up while being seen by their caregiver, this is often termed "white coat hypertension." Its importance is not known. It Alkire be related with eventually developing hypertension or complications of hypertension.   Hypertension is often confused with mental tension, stress, and anxiety.  DIAGNOSIS  The diagnosis is made by 3 separate blood pressure measurements. They are taken at least 1 week apart from each other. If there is organ damage from hypertension, the diagnosis Lemire be made without repeat measurements. Hypertension is usually identified by having blood pressure readings:  Above 140/90 mmHg measured in both arms, at 3 separate times, over a couple weeks.   Over 130/80 mmHg should be considered a risk factor and Grisanti require treatment in patients with diabetes.  Blood pressure readings over 120/80 mmHg are called "pre-hypertension" even in non-diabetic patients. To get a true blood pressure measurement, use the following guidelines. Be aware of the factors that can alter blood pressure readings.  Take measurements at least 1 hour after caffeine.   Take measurements 30 minutes after smoking and without any stress. This is another reason to quit smoking - it raises your blood pressure.   Use a proper cuff size. Ask your caregiver if you are not sure about your cuff size.   Most home blood pressure cuffs are automatic. They will measure systolic and diastolic pressures. The systolic pressure is the pressure reading at the start of sounds. Diastolic pressure is the pressure at which the sounds disappear. If you are elderly, measure pressures in multiple postures. Try sitting, lying or standing.   Sit at rest for a minimum of   5 minutes before taking measurements.   You should not be on any medications like decongestants. These are found in many cold medications.   Record your blood pressure readings and review them with your caregiver.  If you have hypertension:  Your caregiver  may do tests to be sure you do not have secondary hypertension (see "causes" above).   Your caregiver may also look for signs of metabolic syndrome. This is also called Syndrome X or Insulin Resistance Syndrome. You may have this syndrome if you have type 2 diabetes, abdominal obesity, and abnormal blood lipids in addition to hypertension.   Your caregiver will take your medical and family history and perform a physical exam.   Diagnostic tests may include blood tests (for glucose, cholesterol, potassium, and kidney function), a urinalysis, or an EKG. Other tests may also be necessary depending on your condition.  PREVENTION  There are important lifestyle issues that you can adopt to reduce your chance of developing hypertension:  Maintain a normal weight.   Limit the amount of salt (sodium) in your diet.   Exercise often.   Limit alcohol intake.   Get enough potassium in your diet. Discuss specific advice with your caregiver.   Follow a DASH diet (dietary approaches to stop hypertension). This diet is rich in fruits, vegetables, and low-fat dairy products, and avoids certain fats.  PROGNOSIS  Essential hypertension cannot be cured. Lifestyle changes and medical treatment can lower blood pressure and reduce complications. The prognosis of secondary hypertension depends on the underlying cause. Many people whose hypertension is controlled with medicine or lifestyle changes can live a normal, healthy life.  RISKS AND COMPLICATIONS  While high blood pressure alone is not an illness, it often requires treatment due to its short- and long-term effects on many organs. Hypertension increases your risk for:  CVAs or strokes (cerebrovascular accident).   Heart failure due to chronically high blood pressure (hypertensive cardiomyopathy).   Heart attack (myocardial infarction).   Damage to the retina (hypertensive retinopathy).   Kidney failure (hypertensive nephropathy).  Your caregiver can  explain list items above that apply to you. Treatment of hypertension can significantly reduce the risk of complications. TREATMENT   For overweight patients, weight loss and regular exercise are recommended. Physical fitness lowers blood pressure.   Mild hypertension is usually treated with diet and exercise. A diet rich in fruits and vegetables, fat-free dairy products, and foods low in fat and salt (sodium) can help lower blood pressure. Decreasing salt intake decreases blood pressure in a 1/3 of people.   Stop smoking if you are a smoker.  The steps above are highly effective in reducing blood pressure. While these actions are easy to suggest, they are difficult to achieve. Most patients with moderate or severe hypertension end up requiring medications to bring their blood pressure down to a normal level. There are several classes of medications for treatment. Blood pressure pills (antihypertensives) will lower blood pressure by their different actions. Lowering the blood pressure by 10 mmHg may decrease the risk of complications by as much as 25%. The goal of treatment is effective blood pressure control. This will reduce your risk for complications. Your caregiver will help you determine the best treatment for you according to your lifestyle. What is excellent treatment for one person, may not be for you. HOME CARE INSTRUCTIONS   Do not smoke.   Follow the lifestyle changes outlined in the "Prevention" section.   If you are on medications, follow the directions   carefully. Blood pressure medications must be taken as prescribed. Skipping doses reduces their benefit. It also puts you at risk for problems.   Follow up with your caregiver, as directed.   If you are asked to monitor your blood pressure at home, follow the guidelines in the "Diagnosis" section above.  SEEK MEDICAL CARE IF:   You think you are having medication side effects.   You have recurrent headaches or lightheadedness.     You have swelling in your ankles.   You have trouble with your vision.  SEEK IMMEDIATE MEDICAL CARE IF:   You have sudden onset of chest pain or pressure, difficulty breathing, or other symptoms of a heart attack.   You have a severe headache.   You have symptoms of a stroke (such as sudden weakness, difficulty speaking, difficulty walking).  MAKE SURE YOU:   Understand these instructions.   Will watch your condition.   Will get help right away if you are not doing well or get worse.  Document Released: 08/04/2005 Document Revised: 07/24/2011 Document Reviewed: 03/04/2007 [facility] Patient Information 2012 [place], [place].Diabetes, Type 2, Am I At Risk? Diabetes is a lasting (chronic) disease. In type 2 diabetes, the pancreas does not make enough insulin, and the body does not respond normally to the insulin that is made. This type of diabetes was also previously called adult onset diabetes. About 90% of all those who have diabetes have type 2. It usually occurs after the age of 2, but can occur at any age.  People develop type 2 diabetes because they do not use insulin properly. Eventually, the pancreas cannot make enough insulin for the body's needs. Over time, the amount of glucose (sugar) in the blood increases. RISK FACTORS  Overweight - the more weight you have, the more resistant your cells become to insulin.   Family history - you are more likely to get diabetes if a parent or sibling has diabetes.   Race - certain races get diabetes more.   African Americans.   American Indians.   Asian Americans.   Hispanics.   Pacific Islander.   Inactive - exercise helps control weight and helps your cells be more sensitive to insulin.   Gestational diabetes - some women develop diabetes while they are pregnant. This goes away when they deliver. However, they are 50-60% more likely to develop type 2 diabetes at a later time.   Having a baby over 9 pounds - a sign that you  may have had gestational diabetes.   Age - the risk of diabetes goes up as you get older, especially after age 36.   High blood pressure (hypertension).  SYMPTOMS Many people have no signs or symptoms. Symptoms can be so mild that you might not even notice them. Some of these signs are:  Increased thirst.   Increased hunger.   Tiredness (fatigue).   Increased urination, especially at night.   Weight loss.   Blurred vision.   Sores that do not heal.  WHO SHOULD BE TESTED?  Anyone 45 years or older, especially if overweight, should consider getting tested.   If you are younger than 45, overweight, and have one or more of the risk factors, you should consider getting tested.  DIAGNOSIS  Fasting blood glucose (FBS). Usually, 2 are done.   FBS 101-125 mg/dl is considered pre-diabetes.   FBS 126 mg/dl or greater is considered diabetes.   2 hour Oral Glucose Tolerance Test (OGTT). This test is preformed by first having  you not eat or drink for several hours. You are then given something sweet to drink and your blood glucose is measured fasting, at one hour and 2 hours. This test tells how well you are able to handle sugars or carbohydrates.   Fasting: 60-100 mg/dl.   1 hour: less than 200 mg/dl.   2 hours: less than 140 mg/dl.   A1c -A1c is a blood glucose test that gives and average of your blood glucose over 3 months. It is the accepted method to use to diagnose diabetes.   A1c 5.7-6.4% is considered pre-diabetes.   A1c 6.5% or greater is considered diabetes.  WHAT DOES IT MEAN TO HAVE PRE-DIABETES? Pre-diabetes means you are at risk for getting type 2 diabetes. Your blood glucose is higher than normal, but not yet high enough to diagnose diabetes. The good news is, if you have pre-diabetes you can reduce the risk of getting diabetes and even return to normal blood glucose levels. With modest weight loss and moderate physical activity, you can delay or prevent type 2  diabetes.  PREVENTION You cannot do anything about race, age or family history, but you can lower your chances of getting diabetes. You can:   Exercise regularly and be active.   Reduce fat and calorie intake.   Make wise food choices as much as you can.   Reduce your intake of salt and alcohol.   Maintain a reasonable weight.   Keep blood pressure in an acceptable range. Take medication if needed.   Not smoke.   Maintain an acceptable cholesterol level (HDL, LDL, Triglycerides). Take medication if needed.  DOING MY PART: GETTING STARTED Making big changes in your life is hard, especially if you are faced with more than one change. You can make it easier by taking these steps:  Make a plan to change behavior.   Decide exactly what you will do and when you will do it.   Plan what you need to get ready.   Think about what might prevent you from reaching your goals.   Find family and friends who will support and encourage you.   Decide how you will reward yourself when you do what you have planned.   Your doctor, dietitian, or counselor can help you make a plan.  HERE ARE SOME OF THE AREAS YOU MAY WISH TO CHANGE TO REDUCE YOUR RISK OF DIABETES. If you are overweight or obese, choose sensible ways to get in shape. Even small amounts of weight loss, like 5-10 pounds, can help reduce the effects of insulin resistance and help blood glucose control. Diet  Avoid crash diets. Instead, eat less of the foods you usually have. Limit the amount of fat you eat.   Increase your physical activity. Aim for at least 30 minutes of exercise most days of the week.   Set a reasonable weight-loss goal, such as losing 1 pound a week. Aim for a long-term goal of losing 5-7% of your total body weight.   Make wise food choices most of the time.   What you eat has a big impact on your health. By making wise food choices, you can help control your body weight, blood pressure, and cholesterol.    Take a hard look at the serving sizes of the foods you eat. Reduce serving sizes of meat, desserts, and foods high in fat. Increase your intake of fruits and vegetables.   Limit your fat intake to about 25% of your total calories. For example,  if your food choices add up to about 2,000 calories a day, try to eat no more than 56 grams of fat. Your caregiver or a dietitian can help you figure out how much fat to have. You can check food labels for fat content too.   You may also want to reduce the number of calories you have each day.   Keep a food log. Write down what you eat, how much you eat, and anything else that helps keep you on track.   When you meet your goal, reward yourself with a nonfood item or activity.  Exercise  Be physically active every day.   Keep and exercise log. Write down what exercise you did, for how long, and anything else that keeps you on track.   Regular exercise (like brisk walking) tackles several risk factors at once. It helps you lose weight, it keeps your cholesterol and blood pressure under control, and it helps your body use insulin. People who are physically active for 30 minutes a day, 5 days a week, reduced their risk of type 2 diabetes. If you are not very active, you should start slowly at first. Talk with your caregiver first about what kinds of exercise would be safe for you. Make a plan to increase your activity level with the goal of being active for at least 30 minutes a day, most days of the week.   Choose activities you enjoy. Here are some ways to work extra activity into your daily routine:   Take the stairs rather than an elevator or escalator.   Park at the far end of the lot and walk.   Get off the bus a few stops early and walk the rest of the way.   Walk or bicycle instead of drive whenever you can.  Medications Some people need medication to help control their blood pressure or cholesterol levels. If you do, take your medicines as  directed. Ask your caregiver whether there are any medicines you can take to prevent type 2 diabetes. Document Released: 08/07/2003 Document Revised: 07/24/2011 Document Reviewed: 05/02/2009 [facility] Patient Information 2012 [place], [place].Home Care Instructions for Mom After discharge, you may discover that you still have questions about body changes, activity, and care during the next few weeks. The following information should be helpful in answering many of your questions. ACTIVITY Gradually resume your daily activities at home.  Allow time for rest periods during the day and nap while your newborn sleeps.  Avoid heavy lifting (more than 10 lb [4.5 kg]) and strenuous work or sports. Slow to moderate walking is usually safe.  If you had a cesarean delivery, do not vacuum, climb stairs, or drive a car for 4 to 6 weeks.  If you had a cesarean delivery, make arrangements for help at home until you feel you are okay to do your usual activities yourself.  Ask your caregiver for safe exercises to do after delivery, especially if you had a cesarean delivery.  VAGINAL FLOW AND RETURN OF YOUR MENSTRUAL PERIOD Vaginal flow may continue for 4 to 6 weeks after delivery.  Usually the amount decreases and the color of blood gets lighter.  Bright red blood and an increased flow may reoccur if you have been too active.  Lie down, raise (elevate) your feet, place a cold compress on your lower abdomen, rest, and call your caregiver if you are soaking more than 1 pad an hour or passing large clots.  Menstrual periods will usually return 6 to  8 weeks after delivery.  If you are breastfeeding, your period will return anywhere from 8 weeks to the time you stop breastfeeding.  PERINEAL CARE Use the peri bottle and change pads each time you go to the bathroom.  Use towelettes in place of toilet paper until stitches are healed.  Take warm tub baths for 15 to 20 minutes.  Continue to use medicated pads or pain  relieving spray.  Lidocaine cream for episiotomy pain can be used with your caregiver's approval.  Do not use tampons or douches until vaginal bleeding has stopped (about 4 weeks).  Sexual intercourse should be avoided for at least 3 to 4 weeks after delivery or until the brownish-red vaginal flow is completely gone.  Wipe from front to back.  INCISION CARE (AFTER A CESAREAN DELIVERY) Shower as desired. Try to keep your incision dry.  BOWELS AND HEMORRHOIDS Drink at least 6 to 8 glasses of non-caffeinated fluids per day.  Eat fiber-rich diet with whole grains, raw fruits, and vegetables.  Take frequent warm tub baths if hemorrhoids are a problem.  Avoid straining when having a bowel movement.  Over-the-counter medicines and stool softeners can be used as directed by your caregiver.  NUTRITION Eat a well-balanced diet that includes the basic food groups.  Do not try to lose weight quickly by cutting back on calories.  If you are breastfeeding, drink at least 8 to 10 glasses of non-caffeinated fluid per day and increase your intake by 600 calories a day.  Take your prenatal vitamins until your postpartum checkup or until your caregiver tells you to stop.  BREASTFEEDING If you are not breastfeeding: Wear a good tight-fitting bra.  Limit fluid intake after 1 or 2 days after delivery, or as directed, if your breasts are becoming engorged.  Avoid nipple stimulation and apply cool (not icy cold) compresses to the breasts for comfort as needed.  Avoid drinking alcohol and caffeinated drinks.  Mild over-the-counter pain medication recommended by your caregiver is helpful for breast discomfort.  Medications to dry up breast milk are not recommended.  If you are breastfeeding: Encourage your newborn to breastfeed if you think he or she is hungry.  Wash your hands before breastfeeding.  Clean your breasts with warm water before nursing.  Start to encourage feeding 8 to 12 times a day for 10 to 15  minutes on each breast in the beginning to help stimulate milk production and train your newborn.  Avoid water and formula supplements for your newborn unless otherwise directed.  Have your newborn seen by his or her caregiver 3 to 5 days after delivery and again at 2 to 3 weeks to evaluate his or her progress with breastfeeding.  Call your newborn's caregiver if you think he or she is not gaining weight or may be losing weight.  POSTPARTUM BLUES Following delivery, your body goes through a drastic change in hormone levels. You may find yourself crying for no apparent reason and unable to cope with all the changes a newborn brings. Get support from your partner and friends. Give yourself time to adjust. If these feelings persist after several weeks, contact your caregiver or other professionals that can help you. Call your local emergency department, go to the emergency room, or get help right away from a relative, friend, or neighbor if you feel you are about to harm yourself, your newborn, or anyone else. EXERCISES Start Kegel exercises right after delivery. You can do it while standing, sitting, or lying down.  Tighten your stomach muscles and the muscles surrounding your birth canal. Hold for a few seconds and then relax. Repeat 5 times each time. Make Kegel exercises a part of your daily routine to maintain the muscle tone that supports your vagina, bladder, and bowels. SELF BREAST EXAMINATION Do breast self-exams at the same time of the month, each month.  Any lump, bump, or discharge should be reported to your caregiver.  Check your breasts, if you are breastfeeding, just after a feeding when your breasts are less full. If your period has started and you are breastfeeding, check your breasts on the fifth to seventh day of your period.  Breasts are normally lumpy if you are breastfeeding due to the fullness of the milk cells. This is temporary and not a health risk.  INTIMACY AND SEXUALITY New  parents need time to adjust to each other intimately and sexually after giving birth. Try to spend time as a couple discussing ways to adjust to your infant, your new schedule, and how to meet both your desires and needs. Counseling can be helpful in deeply troubled cases. If you are breastfeeding or not yet having a menstrual period, you can get pregnant. Use some form of birth control to prevent getting pregnant. Talk to your caregiver about the birth control choices that are available to you for you situation. SEEK IMMEDIATE MEDICAL CARE IF:  Drainage increases from the Cesarean incision, episiotomy or tear site, or the drainage starts to smell bad.  You soak pads with blood in 1 hour or less.  You have severe lower abdominal pain or cramping.  You have a bad smelling vaginal discharge.  You have increased rather than decreased pain around stitches or swelling, redness, or hardness in area.  You have an oral temperature above 102 F (38.9 C), not controlled by medicine.  You have pain or redness in the calf of the leg.  You feel sick to your stomach (nausea) and throw up (vomit) for 12 hours.  You have sudden, severe chest pain.  You have shortness of breath.  You have painful or bloody urination.  You have visual problems.  You develop a severe headache.  An area of your breast is red and sore, and you have a fever. You may feel like you have flu symptoms.  Document Released: 08/01/2000 Document Revised: 07/24/2011 Document Reviewed: 12/31/2009 [facility] Patient Information 2012 [place], [place].Postpartum Care After Vaginal Delivery After you deliver your baby, you will stay in the hospital for 24 to 72 hours, unless there were problems with the labor or delivery, or you have medical problems. While you are in the hospital, you will receive help and instructions on how to care for yourself and your baby. Your doctor will order pain medicine, in case you need it. You will have a small amount  of bleeding from your vagina and should change your sanitary pad frequently. Wash your hands thoroughly with soap and water for at least 20 seconds after changing pads and using the toilet. Let the nurses know if you begin to pass blood clots or your bleeding increases. Do not flush blood clots down the toilet before having the nurse look at them, to make sure there is no placental tissue with them. If you had an intravenous (IV), it will be removed within 24 hours, if there are no problems. The first time you get out of bed or take a shower, call the nurse to help you because you may get weak, lightheaded, or  even faint. If you are breastfeeding, you may feel painful contractions of your uterus for a couple of weeks. This is normal. The contractions help your uterus get back to normal size. If you are not breastfeeding, wear a supportive bra and handle your breasts as little as possible until your milk has dried up. Hormones should not be given to dry up the breasts, because they can cause blood clots. You will be given your normal diet, unless you have diabetes or other medical problems.  The nurses may put an ice pack on your episiotomy (surgically enlarged opening), if you have one, to reduce the pain and swelling. On rare occasions, you may not be able to urinate and the nurse will need to empty your bladder with a catheter. If you had a postpartum tubal ligation ("tying tubes," female sterilization), it should not make your stay in the hospital longer. You may have your baby in your room with you as much as you like, unless you or the baby has a problem. Use the bassinet (basket) for the baby when going to and from the nursery. Do not carry the baby. Do not leave the postpartum area. If the mother is Rh negative (lacks a protein on the red blood cells) and the baby is Rh positive, the mother should get a Rho-gam shot to prevent Rh problems with future pregnancies. You may be given written instructions for  you and your baby, and necessary medicines, when you are discharged from the hospital. Be sure you understand and follow the instructions as advised. HOME CARE INSTRUCTIONS   Follow instructions and take the medicines given to you.   Only take over-the-counter or prescription medicines for pain, discomfort, or fever as directed by your caregiver.   Do not take aspirin, because it can cause bleeding.   Increase your activities a little bit every day to build up your strength and endurance.   Do not drink alcohol, especially if you are breastfeeding or taking pain medicine.   Take your temperature twice a day and record it.   You may have a small amount of bleeding or spotting for 2 to 4 weeks. This is normal.   Do not use tampons or douche. Use sanitary pads.   Try to have someone stay and help you for a few days when you go home.   Try to rest or take a nap when the baby is sleeping.   If you are breastfeeding, wear a good support bra. If you are not breastfeeding, wear a supportive bra and do not stimulate your nipples.   Eat a healthy, nutritious diet and continue to take your prenatal vitamins.   Do not drive, do any heavy activities, or travel until your caregiver tells you it is okay.   Do not have intercourse until your caregiver gives you permission to do so.   Ask your caregiver when you can begin to exercise and what type of exercises to do.   Call your caregiver if you think you are having a problem from your delivery.   Call your pediatrician if you are having a problem with the baby.   Schedule your postpartum visit and keep it.  SEEK MEDICAL CARE IF:   You have a temperature of 100.4 F (37.8 C) or higher.   You have increased vaginal bleeding or are passing clots. Save any clots to show your caregiver.   You have bloody urine or pain when you urinate.   You have a bad  smelling vaginal discharge.   You have increasing pain or swelling on your episiotomy.    You develop a severe headache.   You feel depressed.   The episiotomy is separating.   You become dizzy or lightheaded.   You develop a rash.   You have a reaction or problems with your medicine.   You have pain, redness, or swelling at the intravenous site.  SEEK IMMEDIATE MEDICAL CARE IF:   You have chest pain.   You develop shortness of breath.   You pass out.   You develop pain, with or without swelling or redness in your leg.   You develop heavy vaginal bleeding, with or without blood clots.   You develop stomach pain.   You develop a bad smelling vaginal discharge.  MAKE SURE YOU:   Understand these instructions.   Will watch your condition.   Will get help right away if you are not doing well or get worse.  Document Released: 06/01/2007 Document Revised: 07/24/2011 Document Reviewed: 06/13/2009 [facility] Patient Information 2012 [place], [place].

## 2011-12-14 NOTE — Progress Notes (Signed)
Discharge instructions reviewed with patient and family.  Patient states understanding of home care.  No home equipment needed.  Patient will stay with baby in hospital until baby discharged.  Patient states understanding of rooming in status.  Instructed patient to obtain prescriptions.

## 2011-12-16 NOTE — Progress Notes (Signed)
UR Chart review completed.  

## 2012-05-02 ENCOUNTER — Emergency Department (HOSPITAL_COMMUNITY)
Admission: EM | Admit: 2012-05-02 | Discharge: 2012-05-02 | Disposition: A | Discharge status: Home / Self Care | Payer: Medicaid Other | Attending: Emergency Medicine | Admitting: Emergency Medicine

## 2012-05-02 ENCOUNTER — Encounter (HOSPITAL_COMMUNITY): Payer: Self-pay | Admitting: Emergency Medicine

## 2012-05-02 DIAGNOSIS — N61 Mastitis without abscess: Secondary | ICD-10-CM | POA: Insufficient documentation

## 2012-05-02 DIAGNOSIS — M79609 Pain in unspecified limb: Secondary | ICD-10-CM | POA: Insufficient documentation

## 2012-05-02 DIAGNOSIS — E669 Obesity, unspecified: Secondary | ICD-10-CM | POA: Insufficient documentation

## 2012-05-02 DIAGNOSIS — I10 Essential (primary) hypertension: Secondary | ICD-10-CM | POA: Insufficient documentation

## 2012-05-02 MED ORDER — OXYCODONE-ACETAMINOPHEN 5-325 MG PO TABS
1.0000 | ORAL_TABLET | Freq: Once | ORAL | Status: AC
  Administered 2012-05-02: 1 via ORAL
  Filled 2012-05-02: qty 1

## 2012-05-02 MED ORDER — SULFAMETHOXAZOLE-TRIMETHOPRIM 800-160 MG PO TABS: 1.0000 | ORAL_TABLET | Freq: Two times a day (BID) | ORAL | Status: AC

## 2012-05-02 MED ORDER — OXYCODONE-ACETAMINOPHEN 5-325 MG PO TABS: 1.0000 | ORAL_TABLET | ORAL | Status: AC | PRN

## 2012-05-02 NOTE — ED Notes (Signed)
Pt with left breast swelling off and on since April after childbirth, states that she was not able to breast feed due to swelling, also had a cyst lanced several months ago, pt states that when her breast swells that it opens back up and starts to bleed some, states that she has been on antibiotics x 3 since April and swelling will go done and wound closes but comes back, states she has seen Dr. Emelda Fear for it several times

## 2012-05-02 NOTE — ED Provider Notes (Signed)
History     CSN: [id number]  Arrival date & time 05/02/12  1141   First MD Initiated Contact with Patient 05/02/12 1158      Chief Complaint  Patient presents with  . Breast Pain    HPI [name] is a 26 y.o. female who presents to the ED with breast pain. The pain is located in the left breast . The pain started after having a baby 5 months ago. She has had 2 occasions that she was treated with Keflex for abscess. The pain radiates to the left axilla. She rates her pain as 10/10. There is some bloody drainage from the left breast which occurs each time she has the infection. The history was provided by the patient.  Past Medical History  Diagnosis Date  . Hypertension December 2011    Been on Aldomet since Dec. 2011  . Headache     usually controlled c Tylenol when not pregnant, but more painful headaches  during preg. due to hypertension.  . Asthma     had since childhood; has not had to use inhaler or nebulizer for 8-9 years. Been doing well.    Past Surgical History  Procedure Date  . Breast reconstruction December 2001    breast reduction at age 82  . Adenoidectomy     age 53    History reviewed. No pertinent family history.  History  Substance Use Topics  . Smoking status: Never Smoker   . Smokeless tobacco: Never Used  . Alcohol Use: No    OB History    Grav Para Term Preterm Abortions TAB SAB Ect Mult Living   3 2 1 1 1  0 1 0 0 2      Review of Systems  Constitutional: Negative for fever.  HENT: Negative for facial swelling and neck pain.   Eyes: Negative.   Respiratory: Negative.   Cardiovascular: Negative for palpitations.       Left breast pain.  Gastrointestinal: Negative for nausea, vomiting and abdominal pain.  Genitourinary: Negative for urgency, frequency and difficulty urinating.  Musculoskeletal: Negative for back pain.       Pain left axilla.  Neurological: Negative for dizziness and headaches.  Psychiatric/Behavioral: Negative for  confusion and agitation.    Allergies  Review of patient's allergies indicates no known allergies.  Home Medications   Current Outpatient Rx  Name Route Sig Dispense Refill  . ACETAMINOPHEN 500 MG PO TABS Oral Take 500 mg by mouth every 6 (six) hours as needed. For pain/headaches    . CYCLOBENZAPRINE HCL 5 MG PO TABS Oral Take 5 mg by mouth daily as needed. For muscle spasms    . LABETALOL HCL 200 MG PO TABS Oral Take 200 mg by mouth 2 (two) times daily. Blood pressure      BP 140/100  Pulse 106  Temp 98.2 F (36.8 C) (Oral)  Ht 5\' 5"  (1.651 m)  Wt 249 lb (112.946 kg)  BMI 41.44 kg/m2  SpO2 100%  LMP 04/26/2012  Breastfeeding? No  Physical Exam  Constitutional: She is oriented to person, place, and time. No distress.       Obese AA female, elevated blood pressure   HENT:  Head: Normocephalic.  Cardiovascular:       tachycardia  Pulmonary/Chest: Effort normal.  Abdominal: Soft. There is no tenderness.  Musculoskeletal:       Pain left axilla with raising left arm  Neurological: She is alert and oriented to person, place, and time.  Skin:       Erythema left breast Tender with palpation @ 3 o'clock. Small area of bloody discharge at site of previous I&D at 9 0'clock  Psychiatric: She has a normal mood and affect. Her behavior is normal. Judgment and thought content normal.   Assessment: 26 y.o. female with left breast abscess    Pain left axilla   Hypertension  Plan:  Bactrim DS    Pain management ED Course: discussed with Dr. Despina Hidden and will have patient follow up in the office tomorrow.   Procedures  Re evaluation: @ 14:00 patient is feeling better after Percocet. Blood pressure rechecked and is normal. Patient will have Dr. Despina Hidden recheck it tomorrow and she will take her BP medication.   Discussed with the patient and all questioned fully answered. She will follow up in the office or return here if any problems arise.   Medication List     As of 05/02/2012  2:11  PM    START taking these medications         oxyCODONE-acetaminophen 5-325 MG per tablet   Commonly known as: PERCOCET/ROXICET   Take 1 tablet by mouth every 4 (four) hours as needed for pain.      sulfamethoxazole-trimethoprim 800-160 MG per tablet   Commonly known as: BACTRIM DS,SEPTRA DS   Take 1 tablet by mouth every 12 (twelve) hours.      ASK your doctor about these medications         acetaminophen 500 MG tablet   Commonly known as: TYLENOL      cyclobenzaprine 5 MG tablet   Commonly known as: FLEXERIL      labetalol 200 MG tablet   Commonly known as: NORMODYNE          Where to get your medications    These are the prescriptions that you need to pick up.   You may get these medications from any pharmacy.         oxyCODONE-acetaminophen 5-325 MG per tablet   sulfamethoxazole-trimethoprim 800-160 MG per tablet                Janne Napoleon, NP 05/02/12 1413

## 2012-05-02 NOTE — ED Notes (Signed)
Per patient: Had a baby in April, pain and swelling in left breast started after. Spot on breast that bleeds every time this occurs and is bleeding now. Swelling and pain goes from under left breast to under left arm.

## 2012-05-04 NOTE — ED Provider Notes (Signed)
Medical screening examination/treatment/procedure(s) were performed by non-physician practitioner and as supervising physician I was immediately available for consultation/collaboration.  Alexandre Lightsey, MD 05/04/12 1500 

## 2012-08-30 ENCOUNTER — Encounter (HOSPITAL_COMMUNITY): Payer: Self-pay | Admitting: *Deleted

## 2012-08-30 ENCOUNTER — Emergency Department (HOSPITAL_COMMUNITY)
Admission: EM | Admit: 2012-08-30 | Discharge: 2012-08-30 | Disposition: A | Discharge status: Home / Self Care | Payer: Medicaid Other | Attending: Emergency Medicine | Admitting: Emergency Medicine

## 2012-08-30 DIAGNOSIS — J45909 Unspecified asthma, uncomplicated: Secondary | ICD-10-CM | POA: Insufficient documentation

## 2012-08-30 DIAGNOSIS — N611 Abscess of the breast and nipple: Secondary | ICD-10-CM

## 2012-08-30 DIAGNOSIS — I10 Essential (primary) hypertension: Secondary | ICD-10-CM | POA: Insufficient documentation

## 2012-08-30 DIAGNOSIS — N61 Mastitis without abscess: Secondary | ICD-10-CM | POA: Insufficient documentation

## 2012-08-30 DIAGNOSIS — Z79899 Other long term (current) drug therapy: Secondary | ICD-10-CM | POA: Insufficient documentation

## 2012-08-30 DIAGNOSIS — N644 Mastodynia: Secondary | ICD-10-CM | POA: Insufficient documentation

## 2012-08-30 LAB — GLUCOSE, CAPILLARY: Glucose-Capillary: 67 mg/dL — ABNORMAL LOW (ref 70–99)

## 2012-08-30 MED ORDER — KETOROLAC TROMETHAMINE 30 MG/ML IJ SOLN
30.0000 mg | Freq: Once | INTRAMUSCULAR | Status: AC
  Administered 2012-08-30: 30 mg via INTRAVENOUS
  Filled 2012-08-30: qty 1

## 2012-08-30 MED ORDER — VANCOMYCIN HCL IN DEXTROSE 1-5 GM/200ML-% IV SOLN
1000.0000 mg | Freq: Once | INTRAVENOUS | Status: AC
  Administered 2012-08-30: 1000 mg via INTRAVENOUS
  Filled 2012-08-30: qty 200

## 2012-08-30 MED ORDER — ONDANSETRON HCL 4 MG/2ML IJ SOLN
4.0000 mg | Freq: Once | INTRAMUSCULAR | Status: AC
  Administered 2012-08-30: 4 mg via INTRAVENOUS
  Filled 2012-08-30: qty 2

## 2012-08-30 MED ORDER — HYDROMORPHONE HCL PF 1 MG/ML IJ SOLN
1.0000 mg | Freq: Once | INTRAMUSCULAR | Status: AC
  Administered 2012-08-30: 1 mg via INTRAVENOUS
  Filled 2012-08-30: qty 1

## 2012-08-30 MED ORDER — OXYCODONE-ACETAMINOPHEN 5-325 MG PO TABS: 2.0000 | ORAL_TABLET | ORAL | Status: DC | PRN

## 2012-08-30 MED ORDER — DOXYCYCLINE HYCLATE 100 MG PO CAPS: 100.0000 mg | ORAL_CAPSULE | Freq: Two times a day (BID) | ORAL | Status: DC

## 2012-08-30 NOTE — ED Notes (Signed)
Large area of redness and swelling to right breast. Pt states ongoing problems with abscesses since April and has been on four different antibiotics. Pt states currently 3 different abscesses to rigth breast with one area having green and yellow drainage.

## 2012-08-30 NOTE — ED Provider Notes (Signed)
History     CSN: [id number]  Arrival date & time 08/30/12  1736   First MD Initiated Contact with Patient 08/30/12 1837      Chief Complaint  Patient presents with  . Breast Pain  . Abscess    (Consider location/radiation/quality/duration/timing/severity/associated sxs/prior treatment) HPI....redness and swelling to right breast for several days. Patient has had multiple episodes of cellulitis and/or abscess on right breasr since April 2013. Status post breast reduction age 27 by Dr. Shon Hough.  No fever, sweats, chills. Is draining some pus. Doxycycline has worked well in the past.  Palpation makes pain worse. Severity is moderate.  Past Medical History  Diagnosis Date  . Hypertension December 2011    Been on Aldomet since Dec. 2011  . Headache     usually controlled c Tylenol when not pregnant, but more painful headaches  during preg. due to hypertension.  . Asthma     had since childhood; has not had to use inhaler or nebulizer for 8-9 years. Been doing well.    Past Surgical History  Procedure Date  . Breast reconstruction December 2001    breast reduction at age 27  . Adenoidectomy     age 27    No family history on file.  History  Substance Use Topics  . Smoking status: Never Smoker   . Smokeless tobacco: Never Used  . Alcohol Use: No    OB History    Grav Para Term Preterm Abortions TAB SAB Ect Mult Living   3 2 1 1 1  0 1 0 0 2      Review of Systems  All other systems reviewed and are negative.    Allergies  Review of patient's allergies indicates no known allergies.  Home Medications   Current Outpatient Rx  Name  Route  Sig  Dispense  Refill  . ACETAMINOPHEN 500 MG PO TABS   Oral   Take 500 mg by mouth every 6 (six) hours as needed. For pain/headaches         . IBUPROFEN 200 MG PO TABS   Oral   Take 400 mg by mouth daily as needed. For pain         . IUD'S IU   Intrauterine   by Intrauterine route.         Marland Kitchen LABETALOL HCL 200  MG PO TABS   Oral   Take 200 mg by mouth 2 (two) times daily. Blood pressure         . DOXYCYCLINE HYCLATE 100 MG PO CAPS   Oral   Take 1 capsule (100 mg total) by mouth 2 (two) times daily. One po bid x 7 days   20 capsule   0   . OXYCODONE-ACETAMINOPHEN 5-325 MG PO TABS   Oral   Take 2 tablets by mouth every 4 (four) hours as needed for pain.   15 tablet   0     BP 125/76  Pulse 93  Temp 98.3 F (36.8 C) (Oral)  Resp 20  Ht 5\' 4"  (1.626 m)  Wt 250 lb (113.399 kg)  BMI 42.91 kg/m2  SpO2 99%  LMP 08/01/2012  Physical Exam  Nursing note and vitals reviewed. Constitutional: She is oriented to person, place, and time. She appears well-developed and well-nourished.  HENT:  Head: Normocephalic and atraumatic.  Eyes: Conjunctivae normal and EOM are normal. Pupils are equal, round, and reactive to light.  Neck: Normal range of motion. Neck supple.  Cardiovascular: Normal rate, regular  rhythm and normal heart sounds.   Pulmonary/Chest: Effort normal and breath sounds normal.  Abdominal: Soft. Bowel sounds are normal.  Musculoskeletal: Normal range of motion.  Neurological: She is alert and oriented to person, place, and time.  Skin:       Right breast: Area of erythema approximately 8 cm in diameter on the inferior breast. Central induration approximately 2-3 cm in diameter. No obvious pus pocket. Slight drainage from wound  Psychiatric: She has a normal mood and affect.    ED Course  Procedures (including critical care time)  Labs Reviewed  GLUCOSE, CAPILLARY - Abnormal; Notable for the following:    Glucose-Capillary 67 (*)     All other components within normal limits   No results found.   1. Abscess of right breast       MDM  Intravenous vancomycin. Discharge home with doxycycline for 10 days. Recommended followup with either plastic surgery or general surgeon to debride breast tissue.  Patient and husband are agreeable with plan        Donnetta Hutching,  MD 08/30/12 2030

## 2012-09-01 ENCOUNTER — Encounter (INDEPENDENT_AMBULATORY_CARE_PROVIDER_SITE_OTHER): Payer: Self-pay | Admitting: General Surgery

## 2012-09-01 ENCOUNTER — Other Ambulatory Visit (INDEPENDENT_AMBULATORY_CARE_PROVIDER_SITE_OTHER): Payer: Self-pay | Admitting: General Surgery

## 2012-09-01 ENCOUNTER — Ambulatory Visit (INDEPENDENT_AMBULATORY_CARE_PROVIDER_SITE_OTHER): Payer: Self-pay | Admitting: General Surgery

## 2012-09-01 VITALS — BP 138/84 | HR 88 | Temp 98.2°F | Resp 22 | Ht 64.0 in | Wt 268.0 lb

## 2012-09-01 DIAGNOSIS — L03319 Cellulitis of trunk, unspecified: Secondary | ICD-10-CM

## 2012-09-01 DIAGNOSIS — N61 Mastitis without abscess: Secondary | ICD-10-CM

## 2012-09-01 DIAGNOSIS — L02213 Cutaneous abscess of chest wall: Secondary | ICD-10-CM

## 2012-09-01 DIAGNOSIS — N611 Abscess of the breast and nipple: Secondary | ICD-10-CM

## 2012-09-01 DIAGNOSIS — L02219 Cutaneous abscess of trunk, unspecified: Secondary | ICD-10-CM

## 2012-09-01 NOTE — Patient Instructions (Signed)
Dr. Derrell Lolling drained 2 separate abscesses at the lower edge of your right breast today. Cultures were taken. Both wounds were packed with gauze.  I gave you a jar of the gauze to use to change the dressing.  Take a shower twice a day and have your husband repack the wound after each shower.  Be sure to fill the prescriptions for Percocet and for doxycycline that you have  Return to see [place] in 2 weeks.

## 2012-09-01 NOTE — Progress Notes (Signed)
Patient ID: [name], female   DOB: [birth year], 27 y.o.   MRN: [id number] History: This is a 27 year old [place] American female with a history of bilateral breast reduction surgery at age 4 by Dr. Shon [name]. . She presented to the [facility] emergency department on January 13 with an abscess at the inframammary crease of her right breast. She says it was not draining at that time. They gave her a prescription for Percocet and doxycycline which she has not filled as she does not have any insurance. She was referred to [place] or management. The wound under the right breast started draining today.  Exam: Patient is morbidly obese. Alert. Anxious but in no physical distress. Her husband is with her throughout encounter. BMI 45.6 Breast showed healed reduction mammoplasty scars. There is a draining wound all at the inframammary crease at 6:00 position. There is also an undrained abscess at the most medial aspect of the inframammary incision.  Procedure note: Betadine prep. 1% Xylocaine with epinephrine. The primary draining wound at the  inframammary crease was opened with a 10 blade both medially and laterally until it was approximately 6 or 7 cm in length. The abscess was localized to that area. The more medially placed abscess was incised with the knife apparently a superficial abscess containing a watery cloudy fluid. It was not a very deep abscess. Both of these were packed. Hemostasis was excellent. She tolerated this well other than for anxiety.  Assessment: 2 separate abscesses of the right breast at the inframammary crease a satiety with incision lines from her reduction mammoplasty  Plan: Both abscesses were incised and drained and cultured today Instructions given to patient and husband to shower and re- pack the wound twice a day She has prescription for doxycycline and Percocet Return to see me in 2 weeks    Khaleah Duer M. Derrell Lolling, M.D., [facility] General and Minimally  invasive Surgery Breast and Colorectal Surgery Office:   [phone] Pager:   [phone]

## 2012-09-02 ENCOUNTER — Telehealth (INDEPENDENT_AMBULATORY_CARE_PROVIDER_SITE_OTHER): Payer: Self-pay | Admitting: General Surgery

## 2012-09-02 NOTE — Telephone Encounter (Signed)
Called patient and advised of appointment to see Dr. Derrell Lolling on 09/14/12 at 1:00 (with Dr. Jacinto Halim permission). Patient stated she is a little sore, but otherwise not in the same amount of pain she was in yesterday. Patient was able to pick up the prescriptions that were issued. Advised patient that if the area gets worse and does not improve to please call. Patient agreed.

## 2012-09-03 ENCOUNTER — Telehealth (INDEPENDENT_AMBULATORY_CARE_PROVIDER_SITE_OTHER): Payer: Self-pay | Admitting: General Surgery

## 2012-09-03 ENCOUNTER — Encounter (INDEPENDENT_AMBULATORY_CARE_PROVIDER_SITE_OTHER): Payer: Self-pay | Admitting: General Surgery

## 2012-09-03 NOTE — Telephone Encounter (Signed)
Additional comment to call previously noted this morning:  Patient requested a letter to return to work on Monday 09/06/12 instead of today due to the drainage she still has from site (I&D by Derrell Lolling 09/01/12 in urgent office). Patient already scheduled to come back to see Dr. Derrell Lolling on 09/14/12.

## 2012-09-03 NOTE — Progress Notes (Signed)
Return to work letter faxed to the patient at [phone]. Confirmation received. Patient requested to return to work on 09/06/12.

## 2012-09-03 NOTE — Telephone Encounter (Signed)
Called patient back based on her message left and confirmed the fax number given will go directly to the patient, not HR department of her employer. Patient also stated that when the dressing was changed yesterday there was still bleeding. The patient said she has not changed the dressing yet but was about to and repack it. I advised that if it is still bleeding to call me back by no later than 11:00. Patient agreed.  If still bleeding she needs to be seen in urgent office today.

## 2012-09-05 LAB — WOUND CULTURE: Gram Stain: NONE SEEN

## 2012-09-14 ENCOUNTER — Encounter (INDEPENDENT_AMBULATORY_CARE_PROVIDER_SITE_OTHER): Payer: Self-pay | Admitting: General Surgery

## 2012-09-14 ENCOUNTER — Ambulatory Visit (INDEPENDENT_AMBULATORY_CARE_PROVIDER_SITE_OTHER): Payer: Self-pay | Admitting: General Surgery

## 2012-09-14 VITALS — BP 138/76 | HR 76 | Temp 97.8°F | Resp 18 | Ht 64.0 in | Wt 265.1 lb

## 2012-09-14 DIAGNOSIS — N611 Abscess of the breast and nipple: Secondary | ICD-10-CM

## 2012-09-14 DIAGNOSIS — N61 Mastitis without abscess: Secondary | ICD-10-CM

## 2012-09-14 NOTE — Patient Instructions (Signed)
The infection under your right breast has now resolved. The small open wound should heal in in 10 days or less. You do not need any further antibiotics or surgery.  Return to see Dr. Derrell Lolling in further problems arise.

## 2012-09-14 NOTE — Progress Notes (Signed)
Patient ID: [name], female   DOB: Jan 13, 1986, 27 y.o.   MRN: [id number] History: This is a 27 year old [place] American female with a history of bilateral breast reduction surgery at age 27 by Dr. Shon Hough. . She presented to the [facility] emergency department on January 13 with an abscess at the inframammary crease of her right breast. She says it was not draining at that time. They gave her a prescription for Percocet and doxycycline which she initially did not fill as she does not have any insurance. She was referred to [place] for management. The wound under the right breast started draining on 09/01/2012, the day we initially saw her.In the office on that date we drained 2 right breast abscess he is at the inframammary crease. One of these was about at the 6:00 position and required a fairly lengthy incision. Another smaller abscess was medial and was also opened up. Cultures grew Proteus, sensitive to just about all antibiotics..  She is currently doing well. Doesn't really have any pain. There is a small open area but no drainage.  Exam: Patient looks well. No distress. The abscess at the medial inframammary crease has completely healed. The abscess at 6 W position showed just a tiny area of granulation tissue. No purulence. No drainage the undrained fluid. This was redressed  Assessment:    2 right breast abscess he is, but that inframammary crease, infection resolved following incision and drainage History reduction mammoplasty History breast-feeding  Plan:  wound care discussed. Return to see me if further problems arise.   Angelia Mould. Derrell Lolling, M.D., [facility] General and Minimally invasive Surgery Breast and Colorectal Surgery Office:   [phone] Pager:   [phone]

## 2012-10-02 ENCOUNTER — Other Ambulatory Visit: Payer: Self-pay

## 2012-11-15 ENCOUNTER — Telehealth: Payer: Self-pay | Admitting: Nurse Practitioner

## 2012-11-15 ENCOUNTER — Encounter: Payer: Self-pay | Admitting: Nurse Practitioner

## 2012-11-15 ENCOUNTER — Other Ambulatory Visit: Payer: Self-pay | Admitting: Nurse Practitioner

## 2012-11-15 MED ORDER — PROMETHAZINE HCL 12.5 MG PO TABS: 12.5000 mg | ORAL_TABLET | Freq: Three times a day (TID) | ORAL | Status: DC | PRN

## 2012-11-15 NOTE — Telephone Encounter (Signed)
Please advise 

## 2012-11-15 NOTE — Telephone Encounter (Signed)
Pt  Notified that rx was sent to pharmacy. Wants to know if she can get a work note for today faxed to [phone]

## 2012-11-15 NOTE — Telephone Encounter (Signed)
Letter sent to you to be faxed

## 2012-11-15 NOTE — Telephone Encounter (Signed)
Letter faxed to number patient provided.

## 2013-01-13 ENCOUNTER — Encounter: Payer: Self-pay | Admitting: Nurse Practitioner

## 2013-01-13 ENCOUNTER — Encounter: Payer: Self-pay | Admitting: *Deleted

## 2013-01-13 ENCOUNTER — Telehealth: Payer: Self-pay | Admitting: Nurse Practitioner

## 2013-01-13 MED ORDER — SULFAMETHOXAZOLE-TRIMETHOPRIM 800-160 MG PO TABS: 1.0000 | ORAL_TABLET | Freq: Two times a day (BID) | ORAL | Status: DC

## 2013-01-13 NOTE — Telephone Encounter (Signed)
Under right arm pit swollen and has gone down to breast she said that last time we gave her antibiotic and she has no insurance

## 2013-01-13 NOTE — Telephone Encounter (Signed)
Please fax letter.

## 2013-01-13 NOTE — Telephone Encounter (Signed)
Does patient have infection there

## 2013-01-13 NOTE — Telephone Encounter (Signed)
Letter done and faxed

## 2013-01-13 NOTE — Telephone Encounter (Signed)
Pt would like a work note for today only- Please fax to her at # [phone]

## 2013-01-13 NOTE — Telephone Encounter (Signed)
rx for bactrim sent to pharmacy

## 2013-02-02 ENCOUNTER — Encounter: Payer: Self-pay | Admitting: Nurse Practitioner

## 2013-02-02 ENCOUNTER — Ambulatory Visit (INDEPENDENT_AMBULATORY_CARE_PROVIDER_SITE_OTHER): Payer: Medicaid Other | Admitting: Nurse Practitioner

## 2013-02-02 ENCOUNTER — Telehealth: Payer: Self-pay | Admitting: Family Medicine

## 2013-02-02 ENCOUNTER — Telehealth: Payer: Self-pay | Admitting: Nurse Practitioner

## 2013-02-02 VITALS — BP 153/94 | HR 116 | Temp 97.6°F | Ht 63.0 in | Wt 254.0 lb

## 2013-02-02 DIAGNOSIS — L500 Allergic urticaria: Secondary | ICD-10-CM

## 2013-02-02 DIAGNOSIS — I10 Essential (primary) hypertension: Secondary | ICD-10-CM | POA: Insufficient documentation

## 2013-02-02 DIAGNOSIS — T783XXA Angioneurotic edema, initial encounter: Secondary | ICD-10-CM

## 2013-02-02 MED ORDER — LISINOPRIL 20 MG PO TABS: 20.0000 mg | ORAL_TABLET | Freq: Every day | ORAL | Status: DC

## 2013-02-02 MED ORDER — PREDNISONE 10 MG PO TABS: ORAL_TABLET | ORAL | Status: DC

## 2013-02-02 MED ORDER — METHYLPREDNISOLONE ACETATE 80 MG/ML IJ SUSP
80.0000 mg | Freq: Once | INTRAMUSCULAR | Status: AC
  Administered 2013-02-02: 80 mg via INTRAMUSCULAR

## 2013-02-02 NOTE — Progress Notes (Signed)
  Subjective:    Patient ID: [name], female    DOB: Mar 31, 1986, 27 y.o.   MRN: [id number]  HPI Patient had a slight cold Monday- has been taking mucinex- Last night she started itching all over and swell- This morning she woke up with her lip swelling.    Review of Systems  Skin: Positive for rash (fine).       Objective:   Physical Exam  Constitutional: She appears well-developed and well-nourished.  Cardiovascular: Normal rate and normal heart sounds.   Pulmonary/Chest: Effort normal and breath sounds normal.  Skin: Skin is warm.  Fine papular rash all over. Upper and lower lip edema  Psychiatric: She has a normal mood and affect. Her behavior is normal. Judgment and thought content normal.          Assessment & Plan:  1. Hypertension Low NA+ diet RTO in 2 weeks recheck with ;labs - lisinopril (PRINIVIL,ZESTRIL) 20 MG tablet; Take 1 tablet (20 mg total) by mouth daily.  Dispense: 90 tablet; Refill: 3  2. Allergic urticaria Avoid scratching Try to figure out what have eaten to cuase reaction - predniSONE (DELTASONE) 10 MG tablet; 2 PO qd X 5 days- Start tomorrow  Dispense: 10 tablet; Refill: 0  3. Angioedema of lips, initial encounter RTO if reoccurs - methylPREDNISolone acetate (DEPO-MEDROL) injection 80 mg; Inject 1 mL (80 mg total) into the muscle once.  Mary-Margaret Daphine Deutscher, FNP

## 2013-02-02 NOTE — Telephone Encounter (Signed)
APPT MADE

## 2013-02-02 NOTE — Patient Instructions (Signed)
Angioedema Angioedema (AE) is a sudden swelling of the eyelids, lips, lobes of ears, external genitalia, skin, and other parts of the body. AE can happen by itself. It usually begins during the night and is found on awakening. It can happen with hives and other allergic reactions. Attacks can be mild and annoying, or life-threatening if the air passages swell. AE generally occurs in a short time period (over minutes to hours) and gets better in 24 to 48 hours. It usually does not cause any serious problems.  There are 2 different kinds of AE:   Allergic AE.  Nonallergic AE.  There may be an overreaction or direct stimulation of cells that are a part of the immune system (mast cells).  There may be problems with the release of chemicals made by the body that cause swelling and inflammation (kinins). AE due to kinins can be inherited from parents (hereditary), or it can develop on its own (acquired). Acquired AE either shows up before, or along with, certain diseases or is due to the body's immune system attacking parts of the body's own cells (autoimmune). CAUSES  Allergic  AE due to allergic reactions are caused by something that causes the body to react (trigger). Common triggers include:  Foods.  Medicines.  Latex.  Direct contact with certain fruits, vegetables, or animal saliva.  Insect stings. Nonallergic  Mast cell stimulation may be caused by:  Medicines.  Dyes used in X-rays.  The body's own immune system reactions to parts of the body (autoimmune disease).  Possibly, some virus infections.  AE due to problems with kinins can be hereditary or acquired. Attacks are triggered by:  Mild injury.  Dental work or any surgery.  Stress.  Sudden changes in temperature.  Exercise.  Medicines.  AE due to problems with kinins can also be due to certain medicines, especially blood pressure medicines like angiotensin-converting enzyme (ACE) inhibitors. African Americans  are at nearly 5 times greater risk of developing AE than Caucasians from ACE inhibitors. SYMPTOMS  Allergic symptoms:  Non-itchy swelling of the skin. Often the swelling is on the face and lips, but any area of the skin can swell. Sometimes, the swelling can be painful. If hives are present, there is intense itching.  Breathing problems if the air passages swell. Nonallergic symptoms:  If internal organs are involved, there may be:  Nausea.  Abdominal pain.  Vomiting.  Difficulty swallowing.  Difficulty passing urine.  Breathing problems if the air passages swell. Depending on the cause of AE, episodes may:  Only happen once (if triggers are removed or avoided).  Come back in unpredictable patterns.  Repeat for several years and then gradually fade away. DIAGNOSIS  AE is diagnosed by:   Asking questions to find out how fast the symptoms began.  Taking a family history.  Physical exam.  Diagnostic tests. Tests could include:  Allergy skin tests to see if the problem is allergic.  Blood tests to diagnose hereditary and some acquired types of AE.  Other tests to see if there is a hidden disease leading to the AE. TREATMENT  Treatment depends on the type and cause (if any) of the AE. Allergic  Allergic types of AE are treated with:  Immediate removal of the trigger or medicine (if any).  Epinephrine injection.  Steroids.  Antihistamines.  Hospitalization for severe attacks. Nonallergic  Mast cell stimulation types of AE are treated with:  Immediate removal of the trigger or medicine (if any).  Epinephrine injection.    Steroids.  Antihistamines.  Hospitalization for severe attacks.  Hereditary AE is treated with:  Medicines to prevent and treat attacks. There is little response to antihistamines, epinephrine, or steroids.  Preventive medicines before dental work or surgery.  Removing or avoiding medicines that trigger  attacks.  Hospitalization for severe attacks.  Acquired AE is treated with:  Treating underlying disease (if any).  Medicines to prevent and treat attacks. HOME CARE INSTRUCTIONS   Always carry your emergency allergy treatment medicines with you.  Wear a medical bracelet.  Avoid known triggers. SEEK MEDICAL CARE IF:   You get repeat attacks.  Your attacks are more frequent or more severe despite preventive measures.  You have hereditary AE and are considering having children. It is important to discuss the risks of passing this on to your children. SEEK IMMEDIATE MEDICAL CARE IF:   You have difficulty breathing.  You have difficulty swallowing.  You experience fainting. This condition should be treated immediately. It can be life-threatening if it involves throat swelling. Document Released: 10/13/2001 Document Revised: 10/27/2011 Document Reviewed: 08/03/2008 [employer] Patient Information 2014 [employer].  

## 2013-02-02 NOTE — Telephone Encounter (Signed)
appt for 2 week made

## 2013-02-03 ENCOUNTER — Telehealth: Payer: Self-pay | Admitting: Nurse Practitioner

## 2013-02-03 NOTE — Telephone Encounter (Signed)
Continue benadryl OTC- Take prednisone- If no better come back tomorrow

## 2013-02-03 NOTE — Telephone Encounter (Signed)
Mmm to address 

## 2013-02-03 NOTE — Telephone Encounter (Signed)
Patient aware.

## 2013-02-04 NOTE — Telephone Encounter (Signed)
LM for pt,if no better come for shot.

## 2013-02-04 NOTE — Telephone Encounter (Signed)
If not a lot better by in th emorning come in office ealier Saturday and I will give another shot

## 2013-02-04 NOTE — Telephone Encounter (Signed)
Pt out of work again today. Please fax work note to cover today to fax number [phone].

## 2013-02-05 ENCOUNTER — Telehealth: Payer: Self-pay | Admitting: Nurse Practitioner

## 2013-02-05 NOTE — Telephone Encounter (Signed)
Patient aware and letter faxed to patient

## 2013-02-05 NOTE — Telephone Encounter (Signed)
Lip has gone completely down and now she is just getting red bumps that come and go does she need to continue benadryl or does she need another shot.

## 2013-02-05 NOTE — Telephone Encounter (Signed)
Continue benadryl

## 2013-02-16 ENCOUNTER — Encounter: Payer: Self-pay | Admitting: Nurse Practitioner

## 2013-02-16 ENCOUNTER — Ambulatory Visit (INDEPENDENT_AMBULATORY_CARE_PROVIDER_SITE_OTHER): Payer: Medicaid Other | Admitting: Nurse Practitioner

## 2013-02-16 VITALS — BP 120/75 | HR 107 | Temp 99.3°F | Ht 63.0 in | Wt 254.0 lb

## 2013-02-16 DIAGNOSIS — I10 Essential (primary) hypertension: Secondary | ICD-10-CM

## 2013-02-16 DIAGNOSIS — R350 Frequency of micturition: Secondary | ICD-10-CM

## 2013-02-16 LAB — POCT URINALYSIS DIPSTICK
Bilirubin, UA: NEGATIVE
Ketones, UA: NEGATIVE
Leukocytes, UA: NEGATIVE
Spec Grav, UA: 1.02
pH, UA: 5

## 2013-02-16 LAB — POCT UA - MICROSCOPIC ONLY
Crystals, Ur, HPF, POC: NEGATIVE
WBC, Ur, HPF, POC: NEGATIVE

## 2013-02-16 LAB — POCT URINE PREGNANCY: Preg Test, Ur: NEGATIVE

## 2013-02-16 NOTE — Progress Notes (Signed)
  Subjective:    Patient ID: NUMA HEATWOLE, female    DOB: April 29, 1986, 29 y.o.   MRN: [id number]  HPI Patient was seen several weeks ago and was dx with hypertension- was started on  Lisinopril- doing much better- has more energy. Only complaint she has is inability to hold her urine.  * Also  about 1 week ago sh ecame in with lip swelling- finally resolved after several doses of steriods- do not know what she reacted to.    Review of Systems  All other systems reviewed and are negative.       Objective:   Physical Exam  Constitutional: She appears well-developed and well-nourished.  Cardiovascular: Normal rate, normal heart sounds and intact distal pulses.   Pulmonary/Chest: Breath sounds normal.   BP 120/75  Pulse 107  Temp(Src) 99.3 F (37.4 C) (Oral)  Ht 5\' 3"  (1.6 m)  Wt 254 lb (115.214 kg)  BMI 45.01 kg/m2  LMP 02/02/2013 UA- WNL       Assessment & Plan:   1. Frequency of urination   2. Hypertension    Orders Placed This Encounter  Procedures  . COMPLETE METABOLIC PANEL WITH GFR  . NMR Lipoprofile with Lipids  . POCT UA - Microscopic Only  . POCT urine pregnancy   Continue curent meds Labs pending Low fat diet and exercise Follow up in 3 months  Mary-Margaret Daphine Deutscher, FNP

## 2013-02-17 LAB — COMPLETE METABOLIC PANEL WITH GFR
ALT: 10 U/L (ref 0–35)
Albumin: 4.2 g/dL (ref 3.5–5.2)
CO2: 26 mEq/L (ref 19–32)
Calcium: 9.8 mg/dL (ref 8.4–10.5)
Chloride: 103 mEq/L (ref 96–112)
GFR, Est African American: >89 mL/min
GFR, Est Non African American: >89 mL/min
Glucose, Bld: 66 mg/dL — ABNORMAL LOW (ref 70–99)
Sodium: 137 mEq/L (ref 135–145)
Total Bilirubin: 0.3 mg/dL (ref 0.3–1.2)
Total Protein: 7.5 g/dL (ref 6.0–8.3)

## 2013-02-17 LAB — NMR LIPOPROFILE WITH LIPIDS
HDL Particle Number: 27.2 umol/L — ABNORMAL LOW (ref >=30.5)
HDL Size: 8.9 nm — ABNORMAL LOW (ref >=9.2)
LDL Size: 20.2 nm — ABNORMAL LOW (ref >20.5)
Large HDL-P: 2.8 umol/L — ABNORMAL LOW (ref >=4.8)
Large VLDL-P: 1.9 nmol/L (ref <=2.7)
Small LDL Particle Number: 841 nmol/L — ABNORMAL HIGH (ref <=527)

## 2013-06-23 ENCOUNTER — Other Ambulatory Visit: Payer: Self-pay

## 2014-06-01 ENCOUNTER — Encounter: Payer: Self-pay | Admitting: Obstetrics & Gynecology

## 2014-06-01 ENCOUNTER — Other Ambulatory Visit (HOSPITAL_COMMUNITY)
Admission: RE | Admit: 2014-06-01 | Discharge: 2014-06-01 | Disposition: A | Discharge status: Home / Self Care | Payer: 59 | Source: Ambulatory Visit | Attending: Obstetrics & Gynecology | Admitting: Obstetrics & Gynecology

## 2014-06-01 ENCOUNTER — Ambulatory Visit (INDEPENDENT_AMBULATORY_CARE_PROVIDER_SITE_OTHER): Payer: 59 | Admitting: Obstetrics & Gynecology

## 2014-06-01 VITALS — BP 130/90 | Ht 63.0 in | Wt 267.0 lb

## 2014-06-01 DIAGNOSIS — R739 Hyperglycemia, unspecified: Secondary | ICD-10-CM

## 2014-06-01 DIAGNOSIS — Z1151 Encounter for screening for human papillomavirus (HPV): Secondary | ICD-10-CM | POA: Insufficient documentation

## 2014-06-01 DIAGNOSIS — Z01419 Encounter for gynecological examination (general) (routine) without abnormal findings: Secondary | ICD-10-CM

## 2014-06-01 DIAGNOSIS — E038 Other specified hypothyroidism: Secondary | ICD-10-CM

## 2014-06-01 NOTE — Progress Notes (Signed)
Patient ID: [name], female   DOB: 1985-11-12, 28 y.o.   MRN: [id number] Subjective:     [name] is a 28 y.o. female here for a routine exam.  No LMP recorded. Patient is not currently having periods (Reason: IUD). [id number] Birth Control Method:  mirena Menstrual Calendar(currently): irregular  Current complaints: carpal tunnell symptoms, bloating and abdominal cramps.   Current acute medical issues:  none   Recent Gynecologic History No LMP recorded. Patient is not currently having periods (Reason: IUD). Last Pap: 2013,  normal Last mammogram: ,    Past Medical History  Diagnosis Date  . Hypertension December 2011    Been on Aldomet since Dec. 2011  . Headache(784.0)     usually controlled c Tylenol when not pregnant, but more painful headaches  during preg. due to hypertension.  . Asthma     had since childhood; has not had to use inhaler or nebulizer for 8-9 years. Been doing well.    Past Surgical History  Procedure Laterality Date  . Breast reconstruction  December 2001    breast reduction at age 22  . Adenoidectomy      age 43    OB History   Grav Para Term Preterm Abortions TAB SAB Ect Mult Living   3 2 1 1 1  0 1 0 0 2      History   Social History  . Marital Status: Married    Spouse Name: N/A    Number of Children: N/A  . Years of Education: N/A   Social History Main Topics  . Smoking status: Never Smoker   . Smokeless tobacco: Never Used  . Alcohol Use: No  . Drug Use: No  . Sexual Activity: Yes   Other Topics Concern  . None   Social History Narrative  . None    Family History  Problem Relation Age of Onset  . Hypertension Mother   . Cancer Mother   . Hypertension Father   . Heart disease Maternal Grandmother     Current outpatient prescriptions:IUD'S IU, by Intrauterine route., Disp: , Rfl: ;  lisinopril (PRINIVIL,ZESTRIL) 20 MG tablet, Take 1 tablet (20 mg total) by mouth daily., Disp: 90 tablet, Rfl: 3;  predniSONE  (DELTASONE) 10 MG tablet, 2 PO qd X 5 days- Start tomorrow, Disp: 10 tablet, Rfl: 0  Review of Systems  Review of Systems  Constitutional: Negative for fever, chills, weight loss, malaise/fatigue and diaphoresis.  HENT: Negative for hearing loss, ear pain, nosebleeds, congestion, sore throat, neck pain, tinnitus and ear discharge.   Eyes: Negative for blurred vision, double vision, photophobia, pain, discharge and redness.  Respiratory: Negative for cough, hemoptysis, sputum production, shortness of breath, wheezing and stridor.   Cardiovascular: Negative for chest pain, palpitations, orthopnea, claudication, leg swelling and PND.  Gastrointestinal: negative for abdominal pain. Negative for heartburn, nausea, vomiting, diarrhea, constipation, blood in stool and melena.  Genitourinary: Negative for dysuria, urgency, frequency, hematuria and flank pain.  Musculoskeletal: Negative for myalgias, back pain, joint pain and falls.  Skin: Negative for itching and rash.  Neurological: Negative for dizziness, tingling, tremors, sensory change, speech change, focal weakness, seizures, loss of consciousness, weakness and headaches.  Endo/Heme/Allergies: Negative for environmental allergies and polydipsia. Does not bruise/bleed easily.  Psychiatric/Behavioral: Negative for depression, suicidal ideas, hallucinations, memory loss and substance abuse. The patient is not nervous/anxious and does not have insomnia.        Objective:  Blood pressure 130/90, height 5\' 3"  (1.6  m), weight 267 lb (121.11 kg).   Physical Exam  Vitals reviewed. Constitutional: She is oriented to person, place, and time. She appears well-developed and well-nourished.  HENT:  Head: Normocephalic and atraumatic.        Right Ear: External ear normal.  Left Ear: External ear normal.  Nose: Nose normal.  Mouth/Throat: Oropharynx is clear and moist.  Eyes: Conjunctivae and EOM are normal. Pupils are equal, round, and reactive to  light. Right eye exhibits no discharge. Left eye exhibits no discharge. No scleral icterus.  Neck: Normal range of motion. Neck supple. No tracheal deviation present. No thyromegaly present.  Cardiovascular: Normal rate, regular rhythm, normal heart sounds and intact distal pulses.  Exam reveals no gallop and no friction rub.   No murmur heard. Respiratory: Effort normal and breath sounds normal. No respiratory distress. She has no wheezes. She has no rales. She exhibits no tenderness.  GI: Soft. Bowel sounds are normal. She exhibits no distension and no mass. There is no tenderness. There is no rebound and no guarding.  Genitourinary:  Breasts no masses skin changes or nipple changes bilaterally      Vulva is normal without lesions Vagina is pink moist without discharge Cervix normal in appearance and pap is done Uterus is normal size shape and contour Adnexa is negative with normal sized ovaries    Musculoskeletal: Normal range of motion. She exhibits no edema and no tenderness.  Neurological: She is alert and oriented to person, place, and time. She has normal reflexes. She displays normal reflexes. No cranial nerve deficit. She exhibits normal muscle tone. Coordination normal.  Skin: Skin is warm and dry. No rash noted. No erythema. No pallor.  Psychiatric: She has a normal mood and affect. Her behavior is normal. Judgment and thought content normal.       Assessment:    Healthy female exam.    Plan:    Contraception: IUD. Follow up in: 1 year. TSH HgbA1C

## 2014-06-02 LAB — HEMOGLOBIN A1C
Hgb A1c MFr Bld: 6.2 % — ABNORMAL HIGH (ref <5.7)
Mean Plasma Glucose: 131 mg/dL — ABNORMAL HIGH (ref <117)

## 2014-06-02 LAB — TSH: TSH: 1.349 u[IU]/mL (ref 0.350–4.500)

## 2014-06-05 LAB — CYTOLOGY - PAP

## 2014-06-12 ENCOUNTER — Telehealth: Payer: Self-pay | Admitting: Obstetrics & Gynecology

## 2014-06-13 NOTE — Telephone Encounter (Signed)
Pt informed of WNL pap results from 06/01/2014, Mean Plasma Glucose 131 and A1C 6.2, TSH 1.349.

## 2014-06-14 NOTE — Telephone Encounter (Signed)
Pt informed of Dr. Elonda Husky recommendation of [place] or [place] diet  (low carb). Pt to f/u in 6 months.

## 2014-06-14 NOTE — Telephone Encounter (Signed)
Agree, low carb diet like [facility] or [employer]

## 2014-06-19 ENCOUNTER — Encounter: Payer: Self-pay | Admitting: Obstetrics & Gynecology

## 2014-09-20 ENCOUNTER — Telehealth: Payer: Self-pay | Admitting: Neurology

## 2014-09-20 NOTE — Telephone Encounter (Signed)
Per report, pt indicated during automated reminder calls that she wanted to cancel her NP appt w/ Dr. Tomi Likens scheduled for 09/22/14. Before the appt is taken out of EPIC, please call patient to confirm this. If pt does want to cancel please cancel appt in EPIC and notify referring provider's office. / Sherri S.

## 2014-09-20 NOTE — Telephone Encounter (Signed)
Called Pt, VM is full. Pt's home and mobil is the same number.  No alternative # are available.

## 2014-09-20 NOTE — Telephone Encounter (Signed)
Please try one more time tomorrow. If you get the same results we will go forward with cancelling the appt. / Sherri S.

## 2014-09-21 ENCOUNTER — Telehealth: Payer: Self-pay | Admitting: Neurology

## 2014-09-21 NOTE — Telephone Encounter (Signed)
Pt resch new patient appt to 10-15-14 adn notified referring dr

## 2014-09-22 ENCOUNTER — Ambulatory Visit: Payer: 59 | Admitting: Neurology

## 2014-09-26 NOTE — Telephone Encounter (Signed)
See 09/21/14 documentation, pt has call back and r/s appt / Sherri S.

## 2014-10-09 ENCOUNTER — Encounter: Payer: Self-pay | Admitting: Neurology

## 2014-10-13 ENCOUNTER — Ambulatory Visit: Payer: Medicaid Other | Admitting: Neurology

## 2014-10-17 ENCOUNTER — Telehealth: Payer: Self-pay | Admitting: Neurology

## 2014-10-17 NOTE — Telephone Encounter (Signed)
Pt no showed NP appt w/ Dr. Tomi Likens 10/13/14. No show letter + Medicaid/no show policy mailed to pt.  Hinton Dyer - please notifiy Dr. Saul Fordyce office of no show / Sherri

## 2014-10-17 NOTE — Telephone Encounter (Signed)
Pt no showed her appt on 10-13-14 and notified Dr Owens Shark office at [phone]

## 2014-10-20 ENCOUNTER — Encounter: Payer: Self-pay | Admitting: *Deleted

## 2014-11-01 ENCOUNTER — Emergency Department (HOSPITAL_COMMUNITY)
Admission: EM | Admit: 2014-11-01 | Discharge: 2014-11-01 | Disposition: A | Discharge status: Home / Self Care | Payer: 59 | Attending: Emergency Medicine | Admitting: Emergency Medicine

## 2014-11-01 ENCOUNTER — Emergency Department (HOSPITAL_COMMUNITY): Payer: 59

## 2014-11-01 ENCOUNTER — Encounter (HOSPITAL_COMMUNITY): Payer: Self-pay | Admitting: *Deleted

## 2014-11-01 DIAGNOSIS — I10 Essential (primary) hypertension: Secondary | ICD-10-CM | POA: Diagnosis not present

## 2014-11-01 DIAGNOSIS — Y9389 Activity, other specified: Secondary | ICD-10-CM | POA: Insufficient documentation

## 2014-11-01 DIAGNOSIS — S59912A Unspecified injury of left forearm, initial encounter: Secondary | ICD-10-CM | POA: Diagnosis present

## 2014-11-01 DIAGNOSIS — Z79899 Other long term (current) drug therapy: Secondary | ICD-10-CM | POA: Insufficient documentation

## 2014-11-01 DIAGNOSIS — J45909 Unspecified asthma, uncomplicated: Secondary | ICD-10-CM | POA: Insufficient documentation

## 2014-11-01 DIAGNOSIS — Y998 Other external cause status: Secondary | ICD-10-CM | POA: Insufficient documentation

## 2014-11-01 DIAGNOSIS — S5012XA Contusion of left forearm, initial encounter: Secondary | ICD-10-CM | POA: Insufficient documentation

## 2014-11-01 DIAGNOSIS — Y92410 Unspecified street and highway as the place of occurrence of the external cause: Secondary | ICD-10-CM | POA: Diagnosis not present

## 2014-11-01 LAB — URINALYSIS, ROUTINE W REFLEX MICROSCOPIC
Bilirubin Urine: NEGATIVE
Glucose, UA: NEGATIVE mg/dL
Hgb urine dipstick: NEGATIVE
Ketones, ur: NEGATIVE mg/dL
Leukocytes, UA: NEGATIVE
NITRITE: NEGATIVE
PH: 5.5 (ref 5.0–8.0)
Protein, ur: NEGATIVE mg/dL
Urobilinogen, UA: 0.2 mg/dL (ref 0.0–1.0)

## 2014-11-01 LAB — PREGNANCY, URINE: Preg Test, Ur: NEGATIVE

## 2014-11-01 MED ORDER — IBUPROFEN 800 MG PO TABS: 800.0000 mg | ORAL_TABLET | Freq: Three times a day (TID) | ORAL | Status: DC

## 2014-11-01 MED ORDER — IBUPROFEN 800 MG PO TABS
800.0000 mg | ORAL_TABLET | Freq: Once | ORAL | Status: AC
  Administered 2014-11-01: 800 mg via ORAL
  Filled 2014-11-01: qty 1

## 2014-11-01 MED ORDER — METHOCARBAMOL 500 MG PO TABS
1000.0000 mg | ORAL_TABLET | Freq: Once | ORAL | Status: AC
  Administered 2014-11-01: 1000 mg via ORAL
  Filled 2014-11-01: qty 2

## 2014-11-01 MED ORDER — METHOCARBAMOL 500 MG PO TABS: 500.0000 mg | ORAL_TABLET | Freq: Three times a day (TID) | ORAL | Status: DC
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
## 2014-11-01 NOTE — ED Notes (Signed)
Discharge instructions given, pt demonstrated teach back and verbal understanding. No concerns voiced.  
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
## 2015-06-12 ENCOUNTER — Other Ambulatory Visit (HOSPITAL_COMMUNITY)
Admission: RE | Admit: 2015-06-12 | Discharge: 2015-06-12 | Disposition: A | Discharge status: Home / Self Care | Payer: 59 | Source: Ambulatory Visit | Attending: Obstetrics & Gynecology | Admitting: Obstetrics & Gynecology

## 2015-06-12 ENCOUNTER — Ambulatory Visit (INDEPENDENT_AMBULATORY_CARE_PROVIDER_SITE_OTHER): Payer: 59 | Admitting: Obstetrics & Gynecology

## 2015-06-12 ENCOUNTER — Encounter: Payer: Self-pay | Admitting: Obstetrics & Gynecology

## 2015-06-12 ENCOUNTER — Other Ambulatory Visit: Payer: Self-pay | Admitting: Obstetrics & Gynecology

## 2015-06-12 VITALS — BP 120/80 | HR 96 | Ht 64.0 in | Wt 271.0 lb

## 2015-06-12 DIAGNOSIS — Z01419 Encounter for gynecological examination (general) (routine) without abnormal findings: Secondary | ICD-10-CM

## 2015-06-12 DIAGNOSIS — R739 Hyperglycemia, unspecified: Secondary | ICD-10-CM

## 2015-06-12 DIAGNOSIS — G4730 Sleep apnea, unspecified: Secondary | ICD-10-CM

## 2015-06-12 DIAGNOSIS — Z3202 Encounter for pregnancy test, result negative: Secondary | ICD-10-CM | POA: Diagnosis not present

## 2015-06-12 DIAGNOSIS — E038 Other specified hypothyroidism: Secondary | ICD-10-CM

## 2015-06-12 LAB — POCT URINE PREGNANCY: PREG TEST UR: NEGATIVE

## 2015-06-12 MED ORDER — SILVER SULFADIAZINE 1 % EX CREA: TOPICAL_CREAM | CUTANEOUS | Status: DC

## 2015-06-12 NOTE — Progress Notes (Signed)
Patient ID: [name], female   DOB: 1986/07/04, 29 y.o.   MRN: [id number] Subjective:     [name] is a 29 y.o. female here for a routine exam.  No LMP recorded. Patient is not currently having periods (Reason: IUD). [id number] Birth Control Method:  IUD Menstrual Calendar(currently): irregular  Current complaints: spotting, tiredness, low energy.   Current acute medical issues:  HS   Recent Gynecologic History No LMP recorded. Patient is not currently having periods (Reason: IUD). Last Pap: 2015,  normal Last mammogram: ,    Past Medical History  Diagnosis Date  . Hypertension December 2011    Been on Aldomet since Dec. 2011  . Headache(784.0)     usually controlled c Tylenol when not pregnant, but more painful headaches  during preg. due to hypertension.  . Asthma     had since childhood; has not had to use inhaler or nebulizer for 8-9 years. Been doing well.    Past Surgical History  Procedure Laterality Date  . Breast reconstruction  December 2001    breast reduction at age 77  . Adenoidectomy      age 60    OB History    Gravida Para Term Preterm AB TAB SAB Ectopic Multiple Living   3 2 1 1 1  0 1 0 0 2      Social History   Social History  . Marital Status: Married    Spouse Name: N/A  . Number of Children: N/A  . Years of Education: N/A   Social History Main Topics  . Smoking status: Never Smoker   . Smokeless tobacco: Never Used  . Alcohol Use: No  . Drug Use: No  . Sexual Activity: Yes    Birth Control/ Protection: IUD   Other Topics Concern  . None   Social History Narrative    Family History  Problem Relation Age of Onset  . Hypertension Mother   . Cancer Mother   . Hypertension Father   . Heart disease Maternal Grandmother      Current outpatient prescriptions:  .  SUMAtriptan (IMITREX) 25 MG tablet, Take 25 mg by mouth every 2 (two) hours as needed for migraine. May repeat in 2 hours if headache persists or recurs., Disp:  , Rfl:  .  ibuprofen (ADVIL,MOTRIN) 800 MG tablet, Take 1 tablet (800 mg total) by mouth 3 (three) times daily. (Patient not taking: Reported on 06/12/2015), Disp: 21 tablet, Rfl: 0 .  IUD'S IU, by Intrauterine route., Disp: , Rfl:  .  lisinopril (PRINIVIL,ZESTRIL) 20 MG tablet, Take 1 tablet (20 mg total) by mouth daily. (Patient not taking: Reported on 11/01/2014), Disp: 90 tablet, Rfl: 3 .  methocarbamol (ROBAXIN) 500 MG tablet, Take 1 tablet (500 mg total) by mouth 3 (three) times daily. (Patient not taking: Reported on 06/12/2015), Disp: 21 tablet, Rfl: 0 .  OVER THE COUNTER MEDICATION, Take 2 tablets by mouth daily. Omegaplex, Disp: , Rfl:  .  OVER THE COUNTER MEDICATION, Take 1 tablet by mouth 3 (three) times daily. catalyst, Disp: , Rfl:  .  predniSONE (DELTASONE) 10 MG tablet, 2 PO qd X 5 days- Start tomorrow (Patient not taking: Reported on 11/01/2014), Disp: 10 tablet, Rfl: 0  Review of Systems  Review of Systems  Constitutional: Negative for fever, chills, weight loss, malaise/fatigue and diaphoresis.  HENT: Negative for hearing loss, ear pain, nosebleeds, congestion, sore throat, neck pain, tinnitus and ear discharge.   Eyes: Negative for blurred vision, double  vision, photophobia, pain, discharge and redness.  Respiratory: Negative for cough, hemoptysis, sputum production, shortness of breath, wheezing and stridor.   Cardiovascular: Negative for chest pain, palpitations, orthopnea, claudication, leg swelling and PND.  Gastrointestinal: negative for abdominal pain. Negative for heartburn, nausea, vomiting, diarrhea, constipation, blood in stool and melena.  Genitourinary: Negative for dysuria, urgency, frequency, hematuria and flank pain.  Musculoskeletal: Negative for myalgias, back pain, joint pain and falls.  Skin: Negative for itching and rash.  Neurological: Negative for dizziness, tingling, tremors, sensory change, speech change, focal weakness, seizures, loss of consciousness,  weakness and headaches.  Endo/Heme/Allergies: Negative for environmental allergies and polydipsia. Does not bruise/bleed easily.  Psychiatric/Behavioral: Negative for depression, suicidal ideas, hallucinations, memory loss and substance abuse. The patient is not nervous/anxious and does not have insomnia.        Objective:  Blood pressure 120/80, pulse 96, height 5\' 4"  (1.626 m), weight 271 lb (122.925 kg).   Physical Exam  Vitals reviewed. Constitutional: She is oriented to person, place, and time. She appears well-developed and well-nourished.  HENT:  Head: Normocephalic and atraumatic.        Right Ear: External ear normal.  Left Ear: External ear normal.  Nose: Nose normal.  Mouth/Throat: Oropharynx is clear and moist.  Eyes: Conjunctivae and EOM are normal. Pupils are equal, round, and reactive to light. Right eye exhibits no discharge. Left eye exhibits no discharge. No scleral icterus.  Neck: Normal range of motion. Neck supple. No tracheal deviation present. No thyromegaly present.  Cardiovascular: Normal rate, regular rhythm, normal heart sounds and intact distal pulses.  Exam reveals no gallop and no friction rub.   No murmur heard. Respiratory: Effort normal and breath sounds normal. No respiratory distress. She has no wheezes. She has no rales. She exhibits no tenderness.  GI: Soft. Bowel sounds are normal. She exhibits no distension and no mass. There is no tenderness. There is no rebound and no guarding.  Genitourinary:  Breasts no masses skin changes or nipple changes bilaterally      Vulva is normal without lesions Vagina is pink moist without discharge Cervix normal in appearance and pap is done Uterus is normal size shape and contour Adnexa is negative with normal sized ovaries   Musculoskeletal: Normal range of motion. She exhibits no edema and no tenderness.  Neurological: She is alert and oriented to person, place, and time. She has normal reflexes. She displays  normal reflexes. No cranial nerve deficit. She exhibits normal muscle tone. Coordination normal.  Skin: Skin is warm and dry. No rash noted. No erythema. No pallor.  Psychiatric: She has a normal mood and affect. Her behavior is normal. Judgment and thought content normal.       Assessment:    Healthy female exam.    Plan:   Check A1C and TSH IUD in place Follow up 1 year

## 2015-06-13 LAB — TSH: TSH: 1.48 u[IU]/mL (ref 0.450–4.500)

## 2015-06-14 LAB — CYTOLOGY - PAP

## 2015-07-29 ENCOUNTER — Emergency Department (HOSPITAL_COMMUNITY)
Admission: EM | Admit: 2015-07-29 | Discharge: 2015-07-30 | Disposition: A | Discharge status: Home / Self Care | Payer: 59 | Attending: Emergency Medicine | Admitting: Emergency Medicine

## 2015-07-29 ENCOUNTER — Encounter (HOSPITAL_COMMUNITY): Payer: Self-pay | Admitting: *Deleted

## 2015-07-29 DIAGNOSIS — R51 Headache: Secondary | ICD-10-CM | POA: Diagnosis not present

## 2015-07-29 DIAGNOSIS — L83 Acanthosis nigricans: Secondary | ICD-10-CM | POA: Insufficient documentation

## 2015-07-29 DIAGNOSIS — Z3202 Encounter for pregnancy test, result negative: Secondary | ICD-10-CM | POA: Diagnosis not present

## 2015-07-29 DIAGNOSIS — R000 Tachycardia, unspecified: Secondary | ICD-10-CM | POA: Diagnosis not present

## 2015-07-29 DIAGNOSIS — N939 Abnormal uterine and vaginal bleeding, unspecified: Secondary | ICD-10-CM | POA: Diagnosis present

## 2015-07-29 DIAGNOSIS — R079 Chest pain, unspecified: Secondary | ICD-10-CM | POA: Diagnosis not present

## 2015-07-29 DIAGNOSIS — E876 Hypokalemia: Secondary | ICD-10-CM | POA: Diagnosis not present

## 2015-07-29 DIAGNOSIS — R631 Polydipsia: Secondary | ICD-10-CM | POA: Insufficient documentation

## 2015-07-29 DIAGNOSIS — I10 Essential (primary) hypertension: Secondary | ICD-10-CM | POA: Insufficient documentation

## 2015-07-29 DIAGNOSIS — H538 Other visual disturbances: Secondary | ICD-10-CM | POA: Diagnosis not present

## 2015-07-29 DIAGNOSIS — R202 Paresthesia of skin: Secondary | ICD-10-CM | POA: Diagnosis not present

## 2015-07-29 LAB — WET PREP, GENITAL
CLUE CELLS WET PREP: NONE SEEN
SPERM: NONE SEEN
Trich, Wet Prep: NONE SEEN
WBC WET PREP: NONE SEEN
Yeast Wet Prep HPF POC: NONE SEEN

## 2015-07-29 LAB — COMPREHENSIVE METABOLIC PANEL
ALT: 14 U/L (ref 14–54)
AST: 16 U/L (ref 15–41)
Albumin: 3.8 g/dL (ref 3.5–5.0)
Alkaline Phosphatase: 44 U/L (ref 38–126)
Anion gap: 6 (ref 5–15)
BILIRUBIN TOTAL: 0.5 mg/dL (ref 0.3–1.2)
BUN: 10 mg/dL (ref 6–20)
CALCIUM: 9.2 mg/dL (ref 8.9–10.3)
CO2: 27 mmol/L (ref 22–32)
Chloride: 104 mmol/L (ref 101–111)
Creatinine, Ser: 0.75 mg/dL (ref 0.44–1.00)
GFR calc Af Amer: >60 mL/min (ref >60)
GLUCOSE: 91 mg/dL (ref 65–99)
POTASSIUM: 3.3 mmol/L — AB (ref 3.5–5.1)
Sodium: 137 mmol/L (ref 135–145)
Total Protein: 7.7 g/dL (ref 6.5–8.1)

## 2015-07-29 LAB — CBC WITH DIFFERENTIAL/PLATELET
BASOS ABS: 0.1 10*3/uL (ref 0.0–0.1)
Basophils Relative: 0 %
Eosinophils Absolute: 0.6 10*3/uL (ref 0.0–0.7)
Eosinophils Relative: 5 %
HCT: 41.8 % (ref 36.0–46.0)
Hemoglobin: 13.6 g/dL (ref 12.0–15.0)
LYMPHS PCT: 30 %
Lymphs Abs: 3.3 10*3/uL (ref 0.7–4.0)
MCH: 26.9 pg (ref 26.0–34.0)
MCHC: 32.5 g/dL (ref 30.0–36.0)
MCV: 82.6 fL (ref 78.0–100.0)
MONO ABS: 0.7 10*3/uL (ref 0.1–1.0)
Monocytes Relative: 7 %
Neutro Abs: 6.6 10*3/uL (ref 1.7–7.7)
Neutrophils Relative %: 58 %
Platelets: 485 10*3/uL — ABNORMAL HIGH (ref 150–400)
RBC: 5.06 MIL/uL (ref 3.87–5.11)
RDW: 13.6 % (ref 11.5–15.5)
WBC: 11.2 10*3/uL — AB (ref 4.0–10.5)

## 2015-07-29 LAB — URINALYSIS, ROUTINE W REFLEX MICROSCOPIC
Bilirubin Urine: NEGATIVE
GLUCOSE, UA: NEGATIVE mg/dL
HGB URINE DIPSTICK: NEGATIVE
Ketones, ur: NEGATIVE mg/dL
LEUKOCYTES UA: NEGATIVE
Nitrite: NEGATIVE
PH: 6 (ref 5.0–8.0)
PROTEIN: NEGATIVE mg/dL
Specific Gravity, Urine: >1.03 — ABNORMAL HIGH (ref 1.005–1.030)

## 2015-07-29 LAB — POC URINE PREG, ED: PREG TEST UR: NEGATIVE

## 2015-07-29 MED ORDER — SODIUM CHLORIDE 0.9 % IV BOLUS (SEPSIS)
1000.0000 mL | Freq: Once | INTRAVENOUS | Status: AC
  Administered 2015-07-29: 1000 mL via INTRAVENOUS

## 2015-07-29 MED ORDER — DEXAMETHASONE SODIUM PHOSPHATE 10 MG/ML IJ SOLN
10.0000 mg | Freq: Once | INTRAMUSCULAR | Status: AC
  Administered 2015-07-29: 10 mg via INTRAVENOUS
  Filled 2015-07-29: qty 1

## 2015-07-29 MED ORDER — METOCLOPRAMIDE HCL 5 MG/ML IJ SOLN
10.0000 mg | Freq: Once | INTRAMUSCULAR | Status: AC
  Administered 2015-07-29: 10 mg via INTRAVENOUS
  Filled 2015-07-29: qty 2

## 2015-07-29 MED ORDER — POTASSIUM CHLORIDE CRYS ER 20 MEQ PO TBCR
40.0000 meq | EXTENDED_RELEASE_TABLET | Freq: Once | ORAL | Status: AC
  Administered 2015-07-29: 40 meq via ORAL
  Filled 2015-07-29: qty 2

## 2015-07-29 MED ORDER — FENTANYL CITRATE (PF) 100 MCG/2ML IJ SOLN
50.0000 ug | Freq: Once | INTRAMUSCULAR | Status: AC
  Administered 2015-07-29: 50 ug via INTRAVENOUS
  Filled 2015-07-29: qty 2

## 2015-07-29 MED ORDER — KETOROLAC TROMETHAMINE 30 MG/ML IJ SOLN
30.0000 mg | Freq: Once | INTRAMUSCULAR | Status: AC
  Administered 2015-07-29: 30 mg via INTRAVENOUS
  Filled 2015-07-29: qty 1

## 2015-07-29 MED ORDER — DIPHENHYDRAMINE HCL 50 MG/ML IJ SOLN
25.0000 mg | Freq: Once | INTRAMUSCULAR | Status: AC
  Administered 2015-07-29: 25 mg via INTRAVENOUS
  Filled 2015-07-29: qty 1

## 2015-07-29 NOTE — ED Provider Notes (Signed)
CSN: [id number]     Arrival date & time 07/29/15  2040 History  By signing my name below, I, [name], attest that this documentation has been prepared under the direction and in the presence of [name] Pew, MD. Electronically Signed: Eustaquio [name], ED Scribe. 07/29/2015. 3:16 PM.   Chief Complaint  Patient presents with  . Generalized Body Aches   The history is provided by the patient. No language interpreter was used.     HPI Comments: [name] is a 29 y.o. female who presents to the Emergency Department complaining of sudden onset, constant, vaginal bleeding that began this morning. Pt has an IUD in place and states she is not supposed to have any menstrual cycles. She also complains of feeling "heavy" with a pre syncopal feeling, throbbing left frontal headache, and left blurry vision that all began today. Pt reports hx of recurrent headaches that she attributes to working on a computer all day long. Pt states that she has also had blurry vision at times with her headaches. She also complains of joint pain for the past 2 years, worse today. She mentions that the joint pains are worse in the morning when she wakes up. She also complains of paresthesias to her hands and feet and polydipsia. Pt spoke to her OBGYN last month about her joint pain while having a pap smear and states the provider chalked it up to being overweight. She did have blood work at that time which was normal. Pt had sudden onset chest pain across her chest while sitting down today that has since resolved on its own. Pt states that it only lasted a few seconds before subsiding.  Denies fever, chills, rash, shortness of breath, diaphoresis, nausea, vomiting, or any other associated symptoms. Fhx diabetes. No fhx Lupus or Sjogrens. Pt's mother does have fibromyalgia. Pt's BP in the ED is 169/121. She reports hx of HTN prior to her being pregnant 4 years ago. Pt was taken off of her blood pressure medication during  her pregnancy but was put back on in her last trimester. Pt states she was then taken off a month after delivering and has not had any issues until today.   Past Medical History  Diagnosis Date  . Hypertension December 2011    Been on Aldomet since Dec. 2011  . Headache(784.0)     usually controlled c Tylenol when not pregnant, but more painful headaches  during preg. due to hypertension.  . Asthma     had since childhood; has not had to use inhaler or nebulizer for 8-9 years. Been doing well.   Past Surgical History  Procedure Laterality Date  . Breast reconstruction  December 2001    breast reduction at age 57  . Adenoidectomy      age 81   Family History  Problem Relation Age of Onset  . Hypertension Mother   . Cancer Mother   . Hypertension Father   . Heart disease Maternal Grandmother    Social History  Substance Use Topics  . Smoking status: Never Smoker   . Smokeless tobacco: Never Used  . Alcohol Use: No   OB History    Gravida Para Term Preterm AB TAB SAB Ectopic Multiple Living   3 2 1 1 1  0 1 0 0 2     Review of Systems  Constitutional: Negative for fever.  Eyes: Positive for visual disturbance.  Respiratory: Negative for shortness of breath.   Cardiovascular: Positive for chest pain. Negative  for leg swelling.  Gastrointestinal: Negative for nausea, vomiting and abdominal pain.  Endocrine: Positive for polydipsia.  Genitourinary: Positive for vaginal bleeding. Negative for dysuria and frequency.  Musculoskeletal: Positive for arthralgias.  Skin: Negative for rash.  Neurological: Positive for headaches.       + paresthesias   All other systems reviewed and are negative.     Allergies  Review of patient's allergies indicates no known allergies.  Home Medications   Prior to Admission medications   Medication Sig Start Date End Date Taking? Authorizing Provider  levonorgestrel (MIRENA) 20 MCG/24HR IUD 1 each by Intrauterine route once.   Yes  Historical Provider, MD  ibuprofen (ADVIL,MOTRIN) 400 MG tablet Take 1 tablet (400 mg total) by mouth every 6 (six) hours as needed. 07/29/15   [name] Pew, MD  potassium chloride SA (K-DUR,KLOR-CON) 20 MEQ tablet Take 2 tablets (40 mEq total) by mouth 2 (two) times daily. 07/30/15 08/04/15  [name] Pew, MD  SUMAtriptan (IMITREX) 25 MG tablet Take 25 mg by mouth every 2 (two) hours as needed for migraine. May repeat in 2 hours if headache persists or recurs.    Historical Provider, MD   Triage Vitals: BP 157/112 mmHg  Pulse 110  Temp(Src) 98.2 F (36.8 C)  Resp 20  Ht 5\' 4"  (1.626 m)  Wt 268 lb (121.564 kg)  BMI 45.98 kg/m2  SpO2 99%   Physical Exam  Constitutional: She is oriented to person, place, and time. She appears well-developed and well-nourished. No distress.  HENT:  Head: Normocephalic and atraumatic.  Eyes: Conjunctivae and EOM are normal.  Neck: Neck supple. No tracheal deviation present.  Aacanthosis around neck  Cardiovascular: Regular rhythm.  Tachycardia present.  Exam reveals no gallop and no friction rub.   No murmur heard. Pulmonary/Chest: Effort normal and breath sounds normal. No respiratory distress. She has no wheezes. She has no rales. She exhibits no tenderness.  Abdominal: Soft. There is no tenderness.  Musculoskeletal: Normal range of motion.  Neurological: She is alert and oriented to person, place, and time.  Skin: Skin is warm and dry. No rash noted.  Psychiatric: She has a normal mood and affect. Her behavior is normal.  Nursing note and vitals reviewed.   ED Course  Procedures (including critical care time)  DIAGNOSTIC STUDIES: Oxygen Saturation is 100% on RA, normal by my interpretation.    COORDINATION OF CARE: 10:05 PM-Discussed treatment plan which includes pelvic exam with pt at bedside and pt agreed to plan.   Labs Review Labs Reviewed  CBC WITH DIFFERENTIAL/PLATELET - Abnormal; Notable for the following:    WBC 11.2 (*)     Platelets 485 (*)    All other components within normal limits  COMPREHENSIVE METABOLIC PANEL - Abnormal; Notable for the following:    Potassium 3.3 (*)    All other components within normal limits  URINALYSIS, ROUTINE W REFLEX MICROSCOPIC (NOT AT [facility]) - Abnormal; Notable for the following:    Specific Gravity, Urine >1.030 (*)    All other components within normal limits  WET PREP, GENITAL  POC URINE PREG, ED  GC/CHLAMYDIA PROBE AMP ([place]) NOT AT [facility]    Imaging Review No results found.   EKG Interpretation None      MDM   Final diagnoses:  Hypokalemia  Abnormal uterine bleeding   Here with mltiple complaints. Likely flu like illness. Hypokalemia likely playing a role as well. Headache benign, improved with ehadache cocktail and fluids, doubt significant intracranial abnormalities. Unsure  of cause of vaginal bleeding as she states she has an IUD in place, will FU with gynecologist.    I personally performed the services described in this documentation, which was scribed in my presence. The recorded information has been reviewed and is accurate.       [name] Pew, MD 08/01/15 [phone]

## 2015-07-29 NOTE — ED Notes (Signed)
Pt also c/o blurry vision to left eye and c/o headache; pt states she has vaginal bleeding and she has an IUD and is not suppose to bleed that started today

## 2015-07-29 NOTE — ED Notes (Signed)
Dr Dayna Barker in room with pt - aware of BP

## 2015-07-29 NOTE — ED Notes (Signed)
Pt c/o generalized body aches that started today

## 2015-07-30 MED ORDER — POTASSIUM CHLORIDE CRYS ER 20 MEQ PO TBCR: 40.0000 meq | EXTENDED_RELEASE_TABLET | Freq: Two times a day (BID) | ORAL | Status: DC

## 2015-07-30 MED ORDER — IBUPROFEN 400 MG PO TABS: 400.0000 mg | ORAL_TABLET | Freq: Four times a day (QID) | ORAL | Status: DC | PRN

## 2015-07-31 LAB — GC/CHLAMYDIA PROBE AMP (~~LOC~~) NOT AT ARMC
CHLAMYDIA, DNA PROBE: NEGATIVE
Neisseria Gonorrhea: NEGATIVE

## 2015-08-17 ENCOUNTER — Encounter (HOSPITAL_COMMUNITY): Payer: Self-pay | Admitting: *Deleted

## 2015-08-17 ENCOUNTER — Emergency Department (HOSPITAL_COMMUNITY)
Admission: EM | Admit: 2015-08-17 | Discharge: 2015-08-17 | Disposition: A | Discharge status: Home / Self Care | Payer: 59 | Attending: Emergency Medicine | Admitting: Emergency Medicine

## 2015-08-17 DIAGNOSIS — I10 Essential (primary) hypertension: Secondary | ICD-10-CM | POA: Diagnosis not present

## 2015-08-17 DIAGNOSIS — L0889 Other specified local infections of the skin and subcutaneous tissue: Secondary | ICD-10-CM | POA: Diagnosis present

## 2015-08-17 DIAGNOSIS — L732 Hidradenitis suppurativa: Secondary | ICD-10-CM

## 2015-08-17 DIAGNOSIS — J029 Acute pharyngitis, unspecified: Secondary | ICD-10-CM | POA: Diagnosis not present

## 2015-08-17 DIAGNOSIS — J45909 Unspecified asthma, uncomplicated: Secondary | ICD-10-CM | POA: Diagnosis not present

## 2015-08-17 DIAGNOSIS — R000 Tachycardia, unspecified: Secondary | ICD-10-CM | POA: Insufficient documentation

## 2015-08-17 MED ORDER — HYDROCODONE-ACETAMINOPHEN 5-325 MG PO TABS
2.0000 | ORAL_TABLET | Freq: Once | ORAL | Status: AC
  Administered 2015-08-17: 2 via ORAL
  Filled 2015-08-17: qty 2

## 2015-08-17 MED ORDER — ONDANSETRON HCL 4 MG PO TABS
4.0000 mg | ORAL_TABLET | Freq: Once | ORAL | Status: AC
  Administered 2015-08-17: 4 mg via ORAL
  Filled 2015-08-17: qty 1

## 2015-08-17 MED ORDER — DOXYCYCLINE HYCLATE 100 MG PO TABS
100.0000 mg | ORAL_TABLET | Freq: Once | ORAL | Status: AC
  Administered 2015-08-17: 100 mg via ORAL
  Filled 2015-08-17: qty 1

## 2015-08-17 MED ORDER — HYDROCODONE-ACETAMINOPHEN 7.5-325 MG PO TABS: 1.0000 | ORAL_TABLET | ORAL | Status: DC | PRN

## 2015-08-17 MED ORDER — DOXYCYCLINE HYCLATE 100 MG PO CAPS: 100.0000 mg | ORAL_CAPSULE | Freq: Two times a day (BID) | ORAL | Status: DC

## 2015-08-17 NOTE — ED Provider Notes (Signed)
CSN: [id number]     Arrival date & time 08/17/15  1854 History   First MD Initiated Contact with Patient 08/17/15 2102     Chief Complaint  Patient presents with  . Recurrent Skin Infections     (Consider location/radiation/quality/duration/timing/severity/associated sxs/prior Treatment) HPI Comments: Patient is a 29 year old female who presents to the emergency department with recurrent skin lesions and infection.  The patient states that she has been having problems with hidradenitis for several years. She states usually her breakouts can be controlled with topical medications. This problem started 2 or 3 days ago, and has been getting progressively worse. It does not seem to be responding to topical medications. The patient states she became concerned because she has a lesion under both arms, under the right breast, and on the inner aspect of the right thigh, as well as on the lower abdomen area she has not had any high fever. She states however the areas are quite sore and painful.  The history is provided by the patient.    Past Medical History  Diagnosis Date  . Hypertension December 2011    Been on Aldomet since Dec. 2011  . Headache(784.0)     usually controlled c Tylenol when not pregnant, but more painful headaches  during preg. due to hypertension.  . Asthma     had since childhood; has not had to use inhaler or nebulizer for 8-9 years. Been doing well.   Past Surgical History  Procedure Laterality Date  . Breast reconstruction  December 2001    breast reduction at age 54  . Adenoidectomy      age 50   Family History  Problem Relation Age of Onset  . Hypertension Mother   . Cancer Mother   . Hypertension Father   . Heart disease Maternal Grandmother    Social History  Substance Use Topics  . Smoking status: Never Smoker   . Smokeless tobacco: Never Used  . Alcohol Use: No   OB History    Gravida Para Term Preterm AB TAB SAB Ectopic Multiple Living   3 2 1 1  1  0 1 0 0 2     Review of Systems  Skin: Positive for wound.  All other systems reviewed and are negative.     Allergies  Review of patient's allergies indicates no known allergies.  Home Medications   Prior to Admission medications   Medication Sig Start Date End Date Taking? Authorizing Provider  ibuprofen (ADVIL,MOTRIN) 400 MG tablet Take 1 tablet (400 mg total) by mouth every 6 (six) hours as needed. 07/29/15  Yes [name], MD  levonorgestrel (MIRENA) 20 MCG/24HR IUD 1 each by Intrauterine route once.   Yes Historical Provider, MD  doxycycline (VIBRAMYCIN) 100 MG capsule Take 1 capsule (100 mg total) by mouth 2 (two) times daily. 08/17/15   [name], PA-C  HYDROcodone-acetaminophen (NORCO) 7.5-325 MG tablet Take 1 tablet by mouth every 4 (four) hours as needed. 08/17/15   [name], PA-C  potassium chloride SA (K-DUR,KLOR-CON) 20 MEQ tablet Take 2 tablets (40 mEq total) by mouth 2 (two) times daily. Patient not taking: Reported on 08/17/2015 07/30/15 08/04/15  [name], MD  SUMAtriptan (IMITREX) 25 MG tablet Take 25 mg by mouth every 2 (two) hours as needed for migraine. May repeat in 2 hours if headache persists or recurs.    Historical Provider, MD   BP 156/109 mmHg  Pulse 104  Temp(Src) 98.9 F (37.2 C) (Oral)  Resp 18  Ht 5\' 4"  (1.626 m)  Wt 120.203 kg  BMI 45.46 kg/m2  SpO2 100% Physical Exam  Constitutional: She is oriented to person, place, and time. She appears well-developed and well-nourished.  Non-toxic appearance.  HENT:  Head: Normocephalic.  Right Ear: Tympanic membrane and external ear normal.  Left Ear: Tympanic membrane and external ear normal.  There is mild increased redness of the posterior pharynx. No exudate noted. No evidence of abscess. The airway is patent. And the speech is clear.  Eyes: EOM and lids are normal. Pupils are equal, round, and reactive to light.  Neck: Normal range of motion. Neck supple. Carotid bruit is not  present.  Cardiovascular: Regular rhythm, normal heart sounds, intact distal pulses and normal pulses.  Tachycardia present.   Pulmonary/Chest: Breath sounds normal. No respiratory distress.  Abdominal: Soft. Bowel sounds are normal. There is no tenderness. There is no guarding.  Musculoskeletal: Normal range of motion.  Lymphadenopathy:       Head (right side): No submandibular adenopathy present.       Head (left side): No submandibular adenopathy present.    She has no cervical adenopathy.  Neurological: She is alert and oriented to person, place, and time. She has normal strength. No cranial nerve deficit or sensory deficit.  Skin: Skin is warm and dry.  There is an open wound, as well as a raised area at the right axilla. There is a raised area without drainage or red streaking of the left axilla. There is a broken skin area with open wound under the right breast. There is noted 3 areas 2 of them open at the pannus of the lower abdomen. There are 2 raised areas one of them open at the inner aspect of the right thigh. There is no drainage at this time, and no red streaks appreciated.  Psychiatric: She has a normal mood and affect. Her speech is normal.  Nursing note and vitals reviewed.   ED Course  Procedures (including critical care time) Labs Review Labs Reviewed - No data to display  Imaging Review No results found. I have personally reviewed and evaluated these images and lab results as part of my medical decision-making.   EKG Interpretation None      MDM  The patient is having an acute exacerbation of her hydradenitis. She has failed topical therapy. Due to multiple lesions, oral antibiotics will be used. The patient has pain at nearly all the raised sites, we'll use Norco for pain at this time. The patient will attempt to reach her dermatologist after the [place] holiday. The patient will return to the emergency department if any high fever, or signs of advancing  infection.  There is mild increased redness of the posterior pharynx, suspect a viral sore throat. Discussed with the patient in need of use of a mask, and saltwater gargles. The patient is in agreement with this discharge plan.    Final diagnoses:  Hidradenitis  Pharyngitis    *I have reviewed nursing notes, vital signs, and all appropriate lab and imaging results for this patient.[address] PA-C 08/17/15 2137  Jola Schmidt, MD 08/18/15 [phone]

## 2015-08-17 NOTE — ED Notes (Addendum)
Pt reporting skin condition that is flaring up.  Reporting swollen and open areas areas on arms and legs as well as increased pain.  Pt also reporting sore throat.

## 2015-08-17 NOTE — Discharge Instructions (Signed)
Please use warm tub soaks daily until seen by her dermatologist. Please see your dermatologist as sone as possible. Use doxycycline 2 times daily with food. Use Tylenol or ibuprofen for mild pain, use Norco for more severe pain. Norco may cause drowsiness, please use this medication with caution. Hidradenitis Suppurativa Hidradenitis suppurativa is a long-term (chronic) skin disease that starts with blocked sweat glands or hair follicles. Bacteria may grow in these blocked openings of your skin. Hidradenitis suppurativa is like a severe form of acne that develops in areas of your body where acne would be unusual. It is most likely to affect the areas of your body where skin rubs against skin and becomes moist. This includes your:  Underarms.  Groin.  Genital areas.  Buttocks.  Upper thighs.  Breasts. Hidradenitis suppurativa may start out with small pimples. The pimples can develop into deep sores that break open (rupture) and drain pus. Over time your skin may thicken and become scarred. Hidradenitis suppurativa cannot be passed from person to person.  CAUSES  The exact cause of hidradenitis suppurativa is not known. This condition may be due to:  Female and female hormones. The condition is rare before and after puberty.  An overactive body defense system (immune system). Your immune system may overreact to the blocked hair follicles or sweat glands and cause swelling and pus-filled sores. RISK FACTORS You may have a higher risk of hidradenitis suppurativa if you:  Are a woman.  Are between ages 9 and 68.  Have a family history of hidradenitis suppurativa.  Have a personal history of acne.  Are overweight.  Smoke.  Take the drug lithium. SIGNS AND SYMPTOMS  The first signs of an outbreak are usually painful skin bumps that look like pimples. As the condition progresses:  Skin bumps may get bigger and grow deeper into the skin.  Bumps under the skin may rupture and drain  smelly pus.  Skin may become itchy and infected.  Skin may thicken and scar.  Drainage may continue through tunnels under the skin (fistulas).  Walking and moving your arms can become painful. DIAGNOSIS  Your health care provider may diagnose hidradenitis suppurativa based on your medical history and your signs and symptoms. A physical exam will also be done. You may need to see a health care provider who specializes in skin diseases (dermatologist). You may also have tests done to confirm the diagnosis. These can include:  Swabbing a sample of pus or drainage from your skin so it can be sent to the lab and tested for infection.  Blood tests to check for infection. TREATMENT  The same treatment will not work for everybody with hidradenitis suppurativa. Your treatment will depend on how severe your symptoms are. You may need to try several treatments to find what works best for you. Part of your treatment may include cleaning and bandaging (dressing) your wounds. You may also have to take medicines, such as the following:  Antibiotics.  Acne medicines.  Medicines to block or suppress the immune system.  A diabetes medicine (metformin) is sometimes used to treat this condition.  For women, birth control pills can sometimes help relieve symptoms. You may need surgery if you have a severe case of hidradenitis suppurativa that does not respond to medicine. Surgery may involve:   Using a laser to clear the skin and remove hair follicles.  Opening and draining deep sores.  Removing the areas of skin that are diseased and scarred. HOME CARE INSTRUCTIONS  Learn  as much as you can about your disease, and work closely with your health care providers.  Take medicines only as directed by your health care provider.  If you were prescribed an antibiotic medicine, finish it all even if you start to feel better.  If you are overweight, losing weight may be very helpful. Try to reach and  maintain a healthy weight.  Do not use any tobacco products, including cigarettes, chewing tobacco, or electronic cigarettes. If you need help quitting, ask your health care provider.  Do not shave the areas where you get hidradenitis suppurativa.  Do not wear deodorant.  Wear loose-fitting clothes.  Try not to overheat and get sweaty.  Take a daily bleach bath as directed by your health care provider.  Fill your bathtub halfway with water.  Pour in  cup of unscented household bleach.  Soak for 5-10 minutes.  Cover sore areas with a warm, clean washcloth (compress) for 5-10 minutes. SEEK MEDICAL CARE IF:   You have a flare-up of hidradenitis suppurativa.  You have chills or a fever.  You are having trouble controlling your symptoms at home.   This information is not intended to replace advice given to you by your health care provider. Make sure you discuss any questions you have with your health care provider.   Document Released: 03/18/2004 Document Revised: 08/25/2014 Document Reviewed: 11/04/2013 Elsevier Interactive Patient Education [employer].

## 2016-03-19 ENCOUNTER — Other Ambulatory Visit: Payer: Self-pay | Admitting: Advanced Practice Midwife

## 2016-03-19 ENCOUNTER — Ambulatory Visit: Payer: 59 | Admitting: Advanced Practice Midwife

## 2016-03-19 MED ORDER — MEGESTROL ACETATE 40 MG PO TABS: ORAL_TABLET | ORAL | 3 refills | Status: DC

## 2016-03-19 NOTE — Progress Notes (Signed)
BTB w/ IUD  Rx megace

## 2016-03-20 ENCOUNTER — Ambulatory Visit: Payer: 59 | Admitting: Obstetrics & Gynecology

## 2016-03-26 ENCOUNTER — Emergency Department (HOSPITAL_COMMUNITY): Payer: 59

## 2016-03-26 ENCOUNTER — Encounter (HOSPITAL_COMMUNITY): Payer: Self-pay | Admitting: *Deleted

## 2016-03-26 ENCOUNTER — Emergency Department (HOSPITAL_COMMUNITY)
Admission: EM | Admit: 2016-03-26 | Discharge: 2016-03-27 | Disposition: A | Discharge status: Home / Self Care | Payer: 59 | Attending: Emergency Medicine | Admitting: Emergency Medicine

## 2016-03-26 DIAGNOSIS — J45909 Unspecified asthma, uncomplicated: Secondary | ICD-10-CM | POA: Diagnosis not present

## 2016-03-26 DIAGNOSIS — R1012 Left upper quadrant pain: Secondary | ICD-10-CM | POA: Diagnosis not present

## 2016-03-26 DIAGNOSIS — R109 Unspecified abdominal pain: Secondary | ICD-10-CM

## 2016-03-26 DIAGNOSIS — R599 Enlarged lymph nodes, unspecified: Secondary | ICD-10-CM | POA: Insufficient documentation

## 2016-03-26 DIAGNOSIS — I10 Essential (primary) hypertension: Secondary | ICD-10-CM | POA: Insufficient documentation

## 2016-03-26 DIAGNOSIS — Z79899 Other long term (current) drug therapy: Secondary | ICD-10-CM | POA: Diagnosis not present

## 2016-03-26 DIAGNOSIS — N939 Abnormal uterine and vaginal bleeding, unspecified: Secondary | ICD-10-CM | POA: Diagnosis present

## 2016-03-26 LAB — URINALYSIS, ROUTINE W REFLEX MICROSCOPIC
Bilirubin Urine: NEGATIVE
GLUCOSE, UA: NEGATIVE mg/dL
HGB URINE DIPSTICK: NEGATIVE
KETONES UR: NEGATIVE mg/dL
LEUKOCYTES UA: NEGATIVE
Nitrite: NEGATIVE
PROTEIN: NEGATIVE mg/dL
Specific Gravity, Urine: 1.015 (ref 1.005–1.030)
pH: 6 (ref 5.0–8.0)

## 2016-03-26 LAB — CBC WITH DIFFERENTIAL/PLATELET
BASOS PCT: 0 %
Basophils Absolute: 0 10*3/uL (ref 0.0–0.1)
EOS ABS: 0.3 10*3/uL (ref 0.0–0.7)
EOS PCT: 3 %
HCT: 40.9 % (ref 36.0–46.0)
Hemoglobin: 13.2 g/dL (ref 12.0–15.0)
Lymphocytes Relative: 25 %
Lymphs Abs: 3.3 10*3/uL (ref 0.7–4.0)
MCH: 26.3 pg (ref 26.0–34.0)
MCHC: 32.3 g/dL (ref 30.0–36.0)
MCV: 81.6 fL (ref 78.0–100.0)
MONO ABS: 0.9 10*3/uL (ref 0.1–1.0)
MONOS PCT: 7 %
NEUTROS PCT: 65 %
Neutro Abs: 8.5 10*3/uL — ABNORMAL HIGH (ref 1.7–7.7)
Platelets: 466 10*3/uL — ABNORMAL HIGH (ref 150–400)
RBC: 5.01 MIL/uL (ref 3.87–5.11)
RDW: 13.8 % (ref 11.5–15.5)
WBC: 13 10*3/uL — ABNORMAL HIGH (ref 4.0–10.5)

## 2016-03-26 LAB — WET PREP, GENITAL
Clue Cells Wet Prep HPF POC: NONE SEEN
SPERM: NONE SEEN
TRICH WET PREP: NONE SEEN
YEAST WET PREP: NONE SEEN

## 2016-03-26 LAB — COMPREHENSIVE METABOLIC PANEL
ALBUMIN: 3.6 g/dL (ref 3.5–5.0)
ALT: 12 U/L — ABNORMAL LOW (ref 14–54)
ANION GAP: 3 — AB (ref 5–15)
AST: 14 U/L — ABNORMAL LOW (ref 15–41)
Alkaline Phosphatase: 41 U/L (ref 38–126)
BUN: 8 mg/dL (ref 6–20)
CO2: 26 mmol/L (ref 22–32)
Calcium: 8.6 mg/dL — ABNORMAL LOW (ref 8.9–10.3)
Chloride: 106 mmol/L (ref 101–111)
Creatinine, Ser: 0.66 mg/dL (ref 0.44–1.00)
GFR calc Af Amer: >60 mL/min (ref >60)
GFR calc non Af Amer: >60 mL/min (ref >60)
GLUCOSE: 112 mg/dL — AB (ref 65–99)
POTASSIUM: 3.3 mmol/L — AB (ref 3.5–5.1)
SODIUM: 135 mmol/L (ref 135–145)
Total Bilirubin: 0.3 mg/dL (ref 0.3–1.2)
Total Protein: 7.3 g/dL (ref 6.5–8.1)

## 2016-03-26 LAB — LIPASE, BLOOD: Lipase: 22 U/L (ref 11–51)

## 2016-03-26 LAB — PREGNANCY, URINE: Preg Test, Ur: NEGATIVE

## 2016-03-26 NOTE — ED Triage Notes (Signed)
Pt c/o left side abd pain, vaginal bleeding, headache, dizziness, headache, nausea that has been intermittent for the past two months but has gotten worse over the past few days,

## 2016-03-26 NOTE — ED Provider Notes (Signed)
[facility] DEPT Provider Note   CSN: [id number] Arrival date & time: 03/26/16  1907  First Provider Contact:  First MD Initiated Contact with Patient 03/26/16 2140    By signing my name below, I, [name], attest that this documentation has been prepared under the direction and in the presence of [name], MD. Electronically Signed: Reola [name], ED Scribe. 03/26/16. 9:47 PM.  History   Chief Complaint Chief Complaint  Patient presents with  . Vaginal Bleeding   HPI HPI Comments: [name] is a 30 y.o. female with a PMHx of asthma, and HTN (on Aldomet), who presents to the Emergency Department complaining of gradual onset, gradually worsening, constant left-sided abdominal pain x ~3 days. States that her pain is radiating downward. She notes that her abdominal pain began after a period of two month vaginal bleeding, which resolved as she passed a clot during the onset of her abdominal pain.  She states that she was using 3 tampons a day. Pt has a Mirena IUD placed, and has had an IUD for ~3 years. She additionally notes that she has been nauseous and had a decreased appetite since the onset of her abdominal pain. No hx of blood transfusion, and her LNMP was prior to receiving her first IUD. Denies emesis, hematuria, dysuria, constipation, diarrhea, CP, dizziness, SOB, light-headedness, or any other associated symptoms.   Past Medical History:  Diagnosis Date  . Asthma    had since childhood; has not had to use inhaler or nebulizer for 8-9 years. Been doing well.  Marland Kitchen Headache(784.0)    usually controlled c Tylenol when not pregnant, but more painful headaches  during preg. due to hypertension.  . Hypertension December 2011   Been on Aldomet since Dec. 2011   Patient Active Problem List   Diagnosis Date Noted  . Encounter for gynecological examination 06/01/2014  . Hypertension 02/02/2013  . Abscess of breast, right 09/01/2012  . Hypertension in  pregnancy, preeclampsia, severe, antepartum 12/11/2011  . Chronic hypertension complicating or reason for care during pregnancy 12/11/2011   Past Surgical History:  Procedure Laterality Date  . ADENOIDECTOMY     age 31  . BREAST RECONSTRUCTION  December 2001   breast reduction at age 78   OB History    Gravida Para Term Preterm AB Living   3 2 1 1 1 2    SAB TAB Ectopic Multiple Live Births   1 0 0 0 1     Home Medications    Prior to Admission medications   Medication Sig Start Date End Date Taking? Authorizing Provider  doxycycline (VIBRAMYCIN) 100 MG capsule Take 1 capsule (100 mg total) by mouth 2 (two) times daily. 08/17/15   [name], PA-C  HYDROcodone-acetaminophen (NORCO) 7.5-325 MG tablet Take 1 tablet by mouth every 4 (four) hours as needed. 08/17/15   [name], PA-C  ibuprofen (ADVIL,MOTRIN) 400 MG tablet Take 1 tablet (400 mg total) by mouth every 6 (six) hours as needed. 07/29/15   [name], MD  levonorgestrel (MIRENA) 20 MCG/24HR IUD 1 each by Intrauterine route once.    Historical Provider, MD  megestrol (MEGACE) 40 MG tablet Take 3/day (at the same time) for 5 days; 2/day for 5 days, then 1/day PO prn bleeding 03/19/16   [name], CNM  potassium chloride SA (K-DUR,KLOR-CON) 20 MEQ tablet Take 2 tablets (40 mEq total) by mouth 2 (two) times daily. Patient not taking: Reported on 08/17/2015 07/30/15 08/04/15  [name], MD  SUMAtriptan (IMITREX) 25 MG tablet Take 25 mg by mouth every 2 (two) hours as needed for migraine. May repeat in 2 hours if headache persists or recurs.    Historical Provider, MD   Family History Family History  Problem Relation Age of Onset  . Hypertension Mother   . Cancer Mother   . Hypertension Father   . Heart disease Maternal Grandmother    Social History Social History  Substance Use Topics  . Smoking status: Never Smoker  . Smokeless tobacco: Never Used  . Alcohol use No   Allergies   Review of  patient's allergies indicates no known allergies.  Review of Systems Review of Systems A complete 10 system review of systems was obtained and all systems are negative except as noted in the HPI and PMH.   Physical Exam Updated Vital Signs BP (!) 165/105 (BP Location: Left Arm)   Pulse 108   Temp 98.8 F (37.1 C) (Oral)   Resp 20   Ht 5\' 4"  (1.626 m)   Wt 250 lb (113.4 kg)   SpO2 100%   BMI 42.91 kg/m   Physical Exam  Constitutional: She is oriented to person, place, and time. She appears well-developed and well-nourished. No distress.  HENT:  Head: Normocephalic and atraumatic.  Mouth/Throat: Oropharynx is clear and moist. No oropharyngeal exudate.  Eyes: Conjunctivae and EOM are normal. Pupils are equal, round, and reactive to light.  Neck: Normal range of motion. Neck supple.  No meningismus.  Cardiovascular: Normal rate, regular rhythm, normal heart sounds and intact distal pulses.   No murmur heard. Pulmonary/Chest: Effort normal and breath sounds normal. No respiratory distress.  Abdominal: Soft. There is tenderness (LUQ and LLQ). There is guarding (voluntary). There is no rebound and no CVA tenderness.  Genitourinary: Vagina normal.  Genitourinary Comments: Chaperone present on exam. Normal external genitalia. White discharge from cervix. IUD string not visualized. Left-sided adnexal pain.   Musculoskeletal: Normal range of motion. She exhibits no edema or tenderness.  Firm 5 cm nodule to R proximal thigh. No erythema, no fluctuance.  Neurological: She is alert and oriented to person, place, and time. No cranial nerve deficit. She exhibits normal muscle tone. Coordination normal.  No ataxia on finger to nose bilaterally. No pronator drift. 5/5 strength throughout. CN 2-12 intact.Equal grip strength. Sensation intact.   Skin: Skin is warm.  Psychiatric: She has a normal mood and affect. Her behavior is normal.  Nursing note and vitals reviewed.  ED Treatments / Results    DIAGNOSTIC STUDIES: Oxygen Saturation is 100% on RA, normal by my interpretation.   COORDINATION OF CARE: 9:42 PM-Discussed next steps with pt. Pt verbalized understanding and is agreeable with the plan.   Labs (all labs ordered are listed, but only abnormal results are displayed) Labs Reviewed  WET PREP, GENITAL - Abnormal; Notable for the following:       Result Value   WBC, Wet Prep HPF POC MANY (*)    All other components within normal limits  CBC WITH DIFFERENTIAL/PLATELET - Abnormal; Notable for the following:    WBC 13.0 (*)    Platelets 466 (*)    Neutro Abs 8.5 (*)    All other components within normal limits  COMPREHENSIVE METABOLIC PANEL - Abnormal; Notable for the following:    Potassium 3.3 (*)    Glucose, Bld 112 (*)    Calcium 8.6 (*)    AST 14 (*)    ALT 12 (*)    Anion gap  3 (*)    All other components within normal limits  URINALYSIS, ROUTINE W REFLEX MICROSCOPIC (NOT AT [facility])  PREGNANCY, URINE  LIPASE, BLOOD  GC/CHLAMYDIA PROBE AMP ([name]) NOT AT [facility]    EKG  EKG Interpretation None       Radiology No results found.  Procedures Procedures (including critical care time)  Medications Ordered in ED Medications - No data to display   Initial Impression / Assessment and Plan / ED Course  I have reviewed the triage vital signs and the nursing notes.  Pertinent labs & imaging results that were available during my care of the patient were reviewed by me and considered in my medical decision making (see chart for details).  Clinical Course  Patient with several months of vaginal bleeding which is now resolved but now L sided abdominal pain.  HCG negative. Hemoglobin stable. IUD strings not seen on exam. Will obtain CT. UA negative. IUD in place on CT.  CT results discussed with Dr. Jeannine Boga in radiology. Also discussed with patient. She states the large mass in her right thigh has been at least since April but is getting larger. She  was told it could be related to her hidradenitis. Dr. Jeannine Boga thinks this is unlikely. The mass appears to be solid. There is no fluctuance or erythema. This is concerning for possible lymphoproliferative disorder.  D/w The patient. We'll refer her to surgery for mass biopsy/resection and oncology. IUD appears to be in place on CT scan. Notably patient appears to have a chronic leukocytosis since 2013. Final Clinical Impressions(s) / ED Diagnoses   Final diagnoses:  Flank pain  Abdominal pain, unspecified abdominal location  Enlarged lymph nodes, unspecified    New Prescriptions New Prescriptions   No medications on file   I personally performed the services described in this documentation, which was scribed in my presence. The recorded information has been reviewed and is accurate.     [name], MD 03/27/16 [id number]

## 2016-03-27 NOTE — Discharge Instructions (Signed)
Your IUD is in the right place. The enlarged lymph nodes in her groin or concerning for possible lymphoma. Follow-up with the surgeon for biopsy and the oncologist. Follow up with the gynceologist it's weird manifestation of rheumatoid arthritis for your IUD concerns. Return to the ED if you develop new or worsening symptoms.

## 2016-03-28 ENCOUNTER — Encounter (HOSPITAL_COMMUNITY): Payer: Self-pay | Admitting: Emergency Medicine

## 2016-03-28 LAB — GC/CHLAMYDIA PROBE AMP (~~LOC~~) NOT AT ARMC
Chlamydia: NEGATIVE
Neisseria Gonorrhea: NEGATIVE

## 2016-03-28 NOTE — Progress Notes (Unsigned)
Introduced myself.  Told her that we watned to see her about 2 weeks after she has seen Dr Arnoldo Morale.  Made follow up appt with [place].  Pt verbalized understanding.

## 2016-04-03 NOTE — Patient Instructions (Signed)
[name]  04/03/2016     @PREFPERIOPPHARMACY @   Your procedure is scheduled on  04/07/2016   Report to [facility] at  740  A.M.  Call this number if you have problems the morning of surgery:  [phone]   Remember:  Do not eat food or drink liquids after midnight.  Take these medicines the morning of surgery with A SIP OF WATER  none   Do not wear jewelry, make-up or nail polish.  Do not wear lotions, powders, or perfumes.  You may wear deoderant.  Do not shave 48 hours prior to surgery.  Men may shave face and neck.  Do not bring valuables to the hospital.  [facility] is not responsible for any belongings or valuables.  Contacts, dentures or bridgework may not be worn into surgery.  Leave your suitcase in the car.  After surgery it may be brought to your room.  For patients admitted to the hospital, discharge time will be determined by your treatment team.  Patients discharged the day of surgery will not be allowed to drive home.   Name and phone number of your driver:   family Special instructions:  none  Please read over the following fact sheets that you were given. Anesthesia Post-op Instructions and Care and Recovery After Surgery      Skin Biopsy WHY AM I HAVING THIS TEST? This test examines skin tissue under a microscope. Your health care provider may perform this test to identify the source of a skin rash or lesion if an allergy is suspected. In this test, a tissue sample (biopsy) is taken to look at your skin's anatomy and to see if certain proteins and antibodies are present. WHAT KIND OF SAMPLE IS TAKEN? A small sample of skin is required for this test. It is usually collected by numbing an area of skin and removing a small circle of skin. HOW DO I PREPARE FOR THE TEST? There is no preparation required for this test. HOW ARE THE TEST RESULTS REPORTED? Your results may be reported in different ways and are dependent upon the kind of test  that is performed. It is your responsibility to obtain your test results. Ask the lab or department performing the test when and how you will get your results. It is most common for your results to be reported as:  Normal skin anatomy (histology).  No evidence of IgG, IgA, or IgM antibody, complement C3, or fibrinogen. WHAT DO THE RESULTS MEAN? Abnormal findings may indicate certain autoimmune diseases. This test can produce many different results. It is important to talk with your health care provider to discuss your results, treatment options, and if necessary, the need for more tests. Talk with your health care provider if you have any questions about your results.   This information is not intended to replace advice given to you by your health care provider. Make sure you discuss any questions you have with your health care provider.   Document Released: 09/05/2004 Document Revised: 08/25/2014 Document Reviewed: 11/01/2014 Elsevier Interactive Patient Education 2016 [place]  An incision (cut) is when a surgeon cuts into your body. After surgery, the cut needs to be well cared for to keep it from getting infected.  HOW TO CARE FOR YOUR CUT  Take medicines only as told by your doctor.  There are many different ways to close and cover a cut, including stitches, skin glue, and  adhesive strips. Follow your doctor's instructions on:  Care of the cut.  Bandage (dressing) changes and removal.  Cut closure removal.  Do not take baths, swim, or use a hot tub until your doctor says it is okay. You may shower as told by your doctor.  Return to your normal diet and activities as allowed by your doctor.  Use medicine that helps lessen itching on your cut as told by your doctor. Do not pick or scratch at your cut.  Drink enough fluids to keep your pee (urine) clear or pale yellow. GET HELP IF:  You have redness, puffiness (swelling), or pain at the site of your  cut.  You have fluid, blood, or pus coming from your cut.  Your muscles ache.  You have chills or you feel sick.  You have a bad smell coming from the cut or bandage.  Your cut opens up after stitches, staples, or adhesive strips have been removed.  You keep feeling sick to your stomach (nauseous) or keep throwing up (vomiting).  You have a fever.  You are dizzy. GET HELP RIGHT AWAY IF:  You have a rash.  You pass out (faint).  You have trouble breathing. MAKE SURE YOU:   Understand these instructions.  Will watch your condition.  Will get help right away if you are not doing well or get worse.   This information is not intended to replace advice given to you by your health care provider. Make sure you discuss any questions you have with your health care provider.   Document Released: 10/27/2011 Document Revised: 08/25/2014 Document Reviewed: 09/28/2013 Elsevier Interactive Patient Education 2016 [employer] PATIENT INSTRUCTIONS POST-ANESTHESIA  IMMEDIATELY FOLLOWING SURGERY:  Do not drive or operate machinery for the first twenty four hours after surgery.  Do not make any important decisions for twenty four hours after surgery or while taking narcotic pain medications or sedatives.  If you develop intractable nausea and vomiting or a severe headache please notify your doctor immediately.  FOLLOW-UP:  Please make an appointment with your surgeon as instructed. You do not need to follow up with anesthesia unless specifically instructed to do so.  WOUND CARE INSTRUCTIONS (if applicable):  Keep a dry clean dressing on the anesthesia/puncture wound site if there is drainage.  Once the wound has quit draining you may leave it open to air.  Generally you should leave the bandage intact for twenty four hours unless there is drainage.  If the epidural site drains for more than 36-48 hours please call the anesthesia department.  QUESTIONS?:  Please feel free to call your  physician or the hospital operator if you have any questions, and they will be happy to assist you.

## 2016-04-04 ENCOUNTER — Encounter (HOSPITAL_COMMUNITY): Payer: Self-pay

## 2016-04-04 ENCOUNTER — Encounter (HOSPITAL_COMMUNITY)
Admission: RE | Admit: 2016-04-04 | Discharge: 2016-04-04 | Disposition: A | Discharge status: Home / Self Care | Payer: 59 | Source: Ambulatory Visit | Attending: General Surgery | Admitting: General Surgery

## 2016-04-04 ENCOUNTER — Ambulatory Visit (HOSPITAL_COMMUNITY)
Admission: RE | Admit: 2016-04-04 | Discharge: 2016-04-04 | Disposition: A | Discharge status: Home / Self Care | Payer: 59 | Source: Ambulatory Visit | Attending: General Surgery | Admitting: General Surgery

## 2016-04-04 DIAGNOSIS — D481 Neoplasm of uncertain behavior of connective and other soft tissue: Secondary | ICD-10-CM | POA: Insufficient documentation

## 2016-04-04 DIAGNOSIS — D492 Neoplasm of unspecified behavior of bone, soft tissue, and skin: Secondary | ICD-10-CM

## 2016-04-04 HISTORY — DX: Sleep apnea, unspecified: G47.30

## 2016-04-04 HISTORY — DX: Hidradenitis suppurativa: L73.2

## 2016-04-04 LAB — PREGNANCY, URINE: Preg Test, Ur: NEGATIVE

## 2016-04-04 NOTE — H&P (Signed)
  NTS SOAP Note  Vital Signs:  Vitals as of: 0000000: Systolic Q000111Q: Diastolic [id number]: Heart Rate 95: Temp 96.46F (Temporal): Height 59ft 4in: Weight 265Lbs 0 Ounces: Pain Level 5: BMI 45.49   BMI : 45.49 kg/m2  Subjective: This 30 year old female presents for of a right thigh mass.  Has been present for six months, initially thought to be related to hidradenitis.  Has been enlarging recently.  No fever, chills, tenderness. Seen in ER, CT scan raised the possibillity of a neoplasm.  Patient referred for biopsy.  No weight loss, trauma to the right leg.  Review of Symptoms:  Constitutional:fatigue headache Eyes:negative Nose/Mouth/Throat:negative Cardiovascular:negative Respiratory:negative Gastrointestinnegative Musculoskeletal:negativejoint, neck, and back pain abscess in groin and axillas swollen lymph nodes Allergic/Immunologic:negative   Past Medical History:Reviewed  Past Medical History  Surgical History: breast reconstruction, iud placement, left axillary surgery Medical Problems: hidradenitis axilas Allergies: nkda Medications: ibuprofen prn   Social History:Reviewed  Social History  Preferred Language: English Race:  Black or African American Ethnicity: Not Hispanic / Latino Age: 77 year Marital Status:  M Alcohol: no   Smoking Status: Never smoker reviewed on 04/03/2016 Functional Status reviewed on 04/03/2016 ------------------------------------------------ Bathing: Normal Cooking: Normal Dressing: Normal Driving: Normal Eating: Normal Managing Meds: Normal Oral Care: Normal Shopping: Normal Toileting: Normal Transferring: Normal Walking: Normal Cognitive Status reviewed on 04/03/2016 ------------------------------------------------ Attention: Normal Decision Making: Normal Language: Normal Memory: Normal Motor: Normal Perception: Normal Problem Solving: Normal Visual and Spatial: Normal   Family History:Reviewed  Family  Health History Mother, Living; Hypertension (high blood pressure); Heart disease;  Father, Living; Diabetes mellitus, unspecified type; Stroke (CVA);     Objective Information: Morbidly obese bf in NAD Head:Atraumatic; no masses; no abnormalities Neck:Supple without lymphadenopathy.  Heart:RRR, no murmur or gallop.  Normal S1, S2.  No S3, S4.  Lungs:CTA bilaterally, no wheezes, rhonchi, rales.  Breathing unlabored. Abdomen:Soft, NT/ND, no HSM, no masses. Palpable dp/pt pulses bilaterally.  Right upper inner thigh with ovoid 6x3cm mass present in subcutaneous tissue.  Somewhat mobile. Shoddy inguinal lymphadenopathy.  No appreciable axillary lymphadenopathy.  Assessment:Right thigh mass  Diagnoses: 782.2  R22.41 Localized swelling, mass and lump, lower limb (Localized swelling, mass and lump, right lower limb)  Procedures: [id number] - OFFICE OUTPATIENT NEW 30 MINUTES    Plan:  Scheduled for biopsy of mass, right thigh on 04/07/16.   Patient Education:Alternative treatments to surgery were discussed with patient (and family).Risks and benefits  of procedure were fully explained to the patient (and family) who gave informed consent. Patient/family questions were addressed.  Follow-up:Pending Surgery

## 2016-04-07 ENCOUNTER — Ambulatory Visit (HOSPITAL_COMMUNITY): Payer: 59 | Admitting: Anesthesiology

## 2016-04-07 ENCOUNTER — Ambulatory Visit (HOSPITAL_COMMUNITY)
Admission: RE | Admit: 2016-04-07 | Discharge: 2016-04-07 | Disposition: A | Discharge status: Home / Self Care | Payer: 59 | Source: Ambulatory Visit | Attending: General Surgery | Admitting: General Surgery

## 2016-04-07 ENCOUNTER — Encounter (HOSPITAL_COMMUNITY): Payer: Self-pay | Admitting: *Deleted

## 2016-04-07 ENCOUNTER — Encounter (HOSPITAL_COMMUNITY)
Admission: RE | Disposition: A | Discharge status: Home / Self Care | Payer: Self-pay | Source: Ambulatory Visit | Attending: General Surgery

## 2016-04-07 DIAGNOSIS — Z823 Family history of stroke: Secondary | ICD-10-CM | POA: Diagnosis not present

## 2016-04-07 DIAGNOSIS — Z8249 Family history of ischemic heart disease and other diseases of the circulatory system: Secondary | ICD-10-CM | POA: Diagnosis not present

## 2016-04-07 DIAGNOSIS — Z833 Family history of diabetes mellitus: Secondary | ICD-10-CM | POA: Insufficient documentation

## 2016-04-07 DIAGNOSIS — I10 Essential (primary) hypertension: Secondary | ICD-10-CM | POA: Insufficient documentation

## 2016-04-07 DIAGNOSIS — J45909 Unspecified asthma, uncomplicated: Secondary | ICD-10-CM | POA: Diagnosis not present

## 2016-04-07 DIAGNOSIS — C8100 Nodular lymphocyte predominant Hodgkin lymphoma, unspecified site: Secondary | ICD-10-CM | POA: Diagnosis present

## 2016-04-07 DIAGNOSIS — G4730 Sleep apnea, unspecified: Secondary | ICD-10-CM | POA: Diagnosis not present

## 2016-04-07 DIAGNOSIS — Z6841 Body Mass Index (BMI) 40.0 and over, adult: Secondary | ICD-10-CM | POA: Insufficient documentation

## 2016-04-07 HISTORY — PX: MASS EXCISION: SHX2000

## 2016-04-07 SURGERY — EXCISION MASS
Anesthesia: General | Laterality: Right

## 2016-04-07 MED ORDER — FENTANYL CITRATE (PF) 100 MCG/2ML IJ SOLN
INTRAMUSCULAR | Status: DC | PRN
  Administered 2016-04-07: 100 ug via INTRAVENOUS
  Administered 2016-04-07 (×2): 50 ug via INTRAVENOUS

## 2016-04-07 MED ORDER — CHLORHEXIDINE GLUCONATE CLOTH 2 % EX PADS: 6.0000 | MEDICATED_PAD | Freq: Once | CUTANEOUS | Status: DC

## 2016-04-07 MED ORDER — FENTANYL CITRATE (PF) 100 MCG/2ML IJ SOLN
25.0000 ug | INTRAMUSCULAR | Status: DC | PRN
  Administered 2016-04-07 (×2): 50 ug via INTRAVENOUS

## 2016-04-07 MED ORDER — FENTANYL CITRATE (PF) 100 MCG/2ML IJ SOLN
INTRAMUSCULAR | Status: AC
  Filled 2016-04-07: qty 4

## 2016-04-07 MED ORDER — LACTATED RINGERS IV SOLN
INTRAVENOUS | Status: DC
  Administered 2016-04-07: 10:00:00 via INTRAVENOUS

## 2016-04-07 MED ORDER — ONDANSETRON HCL 4 MG/2ML IJ SOLN: 4.0000 mg | Freq: Once | INTRAMUSCULAR | Status: DC | PRN

## 2016-04-07 MED ORDER — CHLORHEXIDINE GLUCONATE CLOTH 2 % EX PADS
6.0000 | MEDICATED_PAD | Freq: Once | CUTANEOUS | Status: DC
Start: 2016-04-07 — End: 2016-04-08

## 2016-04-07 MED ORDER — MIDAZOLAM HCL 5 MG/5ML IJ SOLN
INTRAMUSCULAR | Status: DC | PRN
  Administered 2016-04-07: 2 mg via INTRAVENOUS

## 2016-04-07 MED ORDER — CEFAZOLIN SODIUM-DEXTROSE 2-4 GM/100ML-% IV SOLN
2.0000 g | INTRAVENOUS | Status: AC
  Administered 2016-04-07: 2 g via INTRAVENOUS

## 2016-04-07 MED ORDER — HYDROCODONE-ACETAMINOPHEN 7.5-325 MG PO TABS: 1.0000 | ORAL_TABLET | Freq: Once | ORAL | Status: DC | PRN

## 2016-04-07 MED ORDER — PROPOFOL 10 MG/ML IV BOLUS
INTRAVENOUS | Status: AC
  Filled 2016-04-07: qty 20

## 2016-04-07 MED ORDER — CEFAZOLIN SODIUM-DEXTROSE 2-4 GM/100ML-% IV SOLN
INTRAVENOUS | Status: AC
  Filled 2016-04-07: qty 100

## 2016-04-07 MED ORDER — MEPERIDINE HCL 50 MG/ML IJ SOLN: 6.2500 mg | INTRAMUSCULAR | Status: DC | PRN

## 2016-04-07 MED ORDER — KETOROLAC TROMETHAMINE 30 MG/ML IJ SOLN
30.0000 mg | Freq: Once | INTRAMUSCULAR | Status: AC
  Administered 2016-04-07: 30 mg via INTRAVENOUS
  Filled 2016-04-07: qty 1

## 2016-04-07 MED ORDER — PROPOFOL 10 MG/ML IV BOLUS
INTRAVENOUS | Status: DC | PRN
  Administered 2016-04-07: 20 mg via INTRAVENOUS
  Administered 2016-04-07: 150 mg via INTRAVENOUS

## 2016-04-07 MED ORDER — BUPIVACAINE HCL (PF) 0.5 % IJ SOLN
INTRAMUSCULAR | Status: DC | PRN
  Administered 2016-04-07: 10 mL

## 2016-04-07 MED ORDER — FENTANYL CITRATE (PF) 100 MCG/2ML IJ SOLN
INTRAMUSCULAR | Status: AC
  Filled 2016-04-07: qty 2

## 2016-04-07 MED ORDER — HYDROCODONE-ACETAMINOPHEN 5-325 MG PO TABS: 1.0000 | ORAL_TABLET | ORAL | 0 refills | Status: DC | PRN

## 2016-04-07 MED ORDER — BUPIVACAINE HCL (PF) 0.5 % IJ SOLN
INTRAMUSCULAR | Status: AC
  Filled 2016-04-07: qty 30

## 2016-04-07 MED ORDER — ONDANSETRON HCL 4 MG/2ML IJ SOLN
INTRAMUSCULAR | Status: AC
  Filled 2016-04-07: qty 2

## 2016-04-07 MED ORDER — MIDAZOLAM HCL 2 MG/2ML IJ SOLN
INTRAMUSCULAR | Status: AC
  Filled 2016-04-07: qty 2

## 2016-04-07 MED ORDER — 0.9 % SODIUM CHLORIDE (POUR BTL) OPTIME
TOPICAL | Status: DC | PRN
  Administered 2016-04-07: 1000 mL

## 2016-04-07 SURGICAL SUPPLY — 33 items
ADH SKN CLS APL DERMABOND .7 (GAUZE/BANDAGES/DRESSINGS) ×1
BAG HAMPER (MISCELLANEOUS) ×2 IMPLANT
BLADE SURG SZ11 CARB STEEL (BLADE) ×1 IMPLANT
CHLORAPREP W/TINT 10.5 ML (MISCELLANEOUS) ×2 IMPLANT
CLOTH BEACON ORANGE TIMEOUT ST (SAFETY) ×2 IMPLANT
COVER LIGHT HANDLE STERIS (MISCELLANEOUS) ×4 IMPLANT
DECANTER SPIKE VIAL GLASS SM (MISCELLANEOUS) ×2 IMPLANT
DERMABOND ADVANCED (GAUZE/BANDAGES/DRESSINGS) ×1
DERMABOND ADVANCED .7 DNX12 (GAUZE/BANDAGES/DRESSINGS) IMPLANT
ELECT REM PT RETURN 9FT ADLT (ELECTROSURGICAL) ×2
ELECTRODE REM PT RTRN 9FT ADLT (ELECTROSURGICAL) ×1 IMPLANT
FORMALIN 10 PREFIL 120ML (MISCELLANEOUS) ×2 IMPLANT
GLOVE BIOGEL PI IND STRL 7.0 (GLOVE) ×1 IMPLANT
GLOVE BIOGEL PI INDICATOR 7.0 (GLOVE) ×3
GLOVE ECLIPSE 6.5 STRL STRAW (GLOVE) ×1 IMPLANT
GLOVE SURG SS PI 7.5 STRL IVOR (GLOVE) ×2 IMPLANT
GOWN STRL REUS W/ TWL XL LVL3 (GOWN DISPOSABLE) ×1 IMPLANT
GOWN STRL REUS W/TWL LRG LVL3 (GOWN DISPOSABLE) ×3 IMPLANT
GOWN STRL REUS W/TWL XL LVL3 (GOWN DISPOSABLE) ×2
KIT ROOM TURNOVER APOR (KITS) ×2 IMPLANT
MANIFOLD NEPTUNE II (INSTRUMENTS) ×2 IMPLANT
NDL HYPO 25X1 1.5 SAFETY (NEEDLE) ×1 IMPLANT
NEEDLE HYPO 25X1 1.5 SAFETY (NEEDLE) ×2 IMPLANT
NS IRRIG 1000ML POUR BTL (IV SOLUTION) ×2 IMPLANT
PACK MINOR (CUSTOM PROCEDURE TRAY) ×1 IMPLANT
PAD ARMBOARD 7.5X6 YLW CONV (MISCELLANEOUS) ×2 IMPLANT
SET BASIN LINEN APH (SET/KITS/TRAYS/PACK) ×2 IMPLANT
SUT SILK 2 0 (SUTURE) ×2
SUT SILK 2-0 18XBRD TIE 12 (SUTURE) IMPLANT
SUT VIC AB 3-0 SH 27 (SUTURE) ×2
SUT VIC AB 3-0 SH 27X BRD (SUTURE) IMPLANT
SUT VIC AB 4-0 PS2 27 (SUTURE) ×1 IMPLANT
SYR CONTROL 10ML LL (SYRINGE) ×2 IMPLANT

## 2016-04-07 NOTE — Addendum Note (Signed)
Addendum  created 04/07/16 1226 by Josephine Igo, MD   Sign clinical note

## 2016-04-07 NOTE — Anesthesia Postprocedure Evaluation (Signed)
Anesthesia Post Note  Patient: [name]  Procedure(s) Performed: Procedure(s) (LRB): EXCISION/BIOPSY SOFT TISSUE MASS RIGHT THIGH (Right)  Patient location during evaluation: PACU Anesthesia Type: General Level of consciousness: awake and alert and oriented Pain management: pain level controlled Vital Signs Assessment: post-procedure vital signs reviewed and stable Respiratory status: spontaneous breathing, nonlabored ventilation, respiratory function stable and patient connected to face mask oxygen Cardiovascular status: blood pressure returned to baseline and stable Postop Assessment: no signs of nausea or vomiting Anesthetic complications: no    Last Vitals:  Vitals:   04/07/16 1200 04/07/16 1215  BP: (!) 147/104 (!) 139/104  Pulse: 89 90  Resp: (!) 22 (!) 21  Temp:      Last Pain:  Vitals:   04/07/16 1205  TempSrc:   PainSc: 5                  Olga Bourbeau A.

## 2016-04-07 NOTE — Anesthesia Preprocedure Evaluation (Addendum)
Anesthesia Evaluation  Patient identified by MRN, date of birth, ID band Patient awake    Reviewed: Allergy & Precautions, NPO status , Patient's Chart, lab work & pertinent test results  Airway Mallampati: III  TM Distance: >3 FB Neck ROM: Full    Dental no notable dental hx. (+) Teeth Intact   Pulmonary asthma , sleep apnea ,  Patient snores, had sleep study but has yet to have CPAP titrated   Pulmonary exam normal breath sounds clear to auscultation       Cardiovascular hypertension, Normal cardiovascular exam Rhythm:Regular Rate:Normal  PIH   Neuro/Psych  Headaches, negative psych ROS   GI/Hepatic negative GI ROS, Neg liver ROS,   Endo/Other  Morbid obesity  Renal/GU negative Renal ROS  negative genitourinary   Musculoskeletal Soft tissue mass right thigh   Abdominal (+) + obese,   Peds  Hematology negative hematology ROS (+)   Anesthesia Other Findings   Reproductive/Obstetrics negative OB ROS                            Anesthesia Physical Anesthesia Plan  ASA: III  Anesthesia Plan: General   Post-op Pain Management:    Induction: Intravenous  Airway Management Planned: LMA  Additional Equipment:   Intra-op Plan:   Post-operative Plan: Extubation in OR  Informed Consent: I have reviewed the patients History and Physical, chart, labs and discussed the procedure including the risks, benefits and alternatives for the proposed anesthesia with the patient or authorized representative who has indicated his/her understanding and acceptance.   Dental advisory given  Plan Discussed with: Anesthesiologist, CRNA and Surgeon  Anesthesia Plan Comments:         Anesthesia Quick Evaluation

## 2016-04-07 NOTE — Discharge Instructions (Signed)

## 2016-04-07 NOTE — Anesthesia Procedure Notes (Signed)
Procedure Name: LMA Insertion Date/Time: 04/07/2016 10:54 AM Performed by: Charmaine Downs Pre-anesthesia Checklist: Patient identified, Patient being monitored, Emergency Drugs available, Timeout performed and Suction available Patient Re-evaluated:Patient Re-evaluated prior to inductionOxygen Delivery Method: Circle System Utilized Preoxygenation: Pre-oxygenation with 100% oxygen Intubation Type: IV induction Ventilation: Mask ventilation without difficulty LMA: LMA inserted LMA Size: 4.0 Number of attempts: 1 Placement Confirmation: positive ETCO2 and breath sounds checked- equal and bilateral Tube secured with: Tape Dental Injury: Teeth and Oropharynx as per pre-operative assessment

## 2016-04-07 NOTE — Transfer of Care (Signed)
Immediate Anesthesia Transfer of Care Note  Patient: [name]  Procedure(s) Performed: Procedure(s): EXCISION/BIOPSY SOFT TISSUE MASS RIGHT THIGH (Right)  Patient Location: PACU  Anesthesia Type:General  Level of Consciousness: awake and patient cooperative  Airway & Oxygen Therapy: Patient Spontanous Breathing and Patient connected to face mask oxygen  Post-op Assessment: Report given to RN, Post -op Vital signs reviewed and stable and Patient moving all extremities  Post vital signs: Reviewed and stable  Last Vitals:  Vitals:   04/07/16 0937  BP: 139/90  Pulse: 98  Resp: (!) 25  Temp: 36.7 C    Last Pain:  Vitals:   04/07/16 0937  TempSrc: Oral      Patients Stated Pain Goal: 5 ([id number])  Complications: No apparent anesthesia complications

## 2016-04-07 NOTE — Anesthesia Postprocedure Evaluation (Signed)
Anesthesia Post Note  Patient: [name]  Procedure(s) Performed: Procedure(s) (LRB): EXCISION/BIOPSY SOFT TISSUE MASS RIGHT THIGH (Right)  Patient location during evaluation: PACU Anesthesia Type: General Level of consciousness: awake, oriented and patient cooperative Pain management: pain level controlled Vital Signs Assessment: post-procedure vital signs reviewed and stable Respiratory status: spontaneous breathing, nonlabored ventilation and respiratory function stable Cardiovascular status: blood pressure returned to baseline and stable Postop Assessment: no signs of nausea or vomiting Anesthetic complications: no    Last Vitals:  Vitals:   04/07/16 0937 04/07/16 1145  BP: 139/90 (!) 128/98  Pulse: 98   Resp: (!) 25 14  Temp: 36.7 C (P) 36.7 C    Last Pain:  Vitals:   04/07/16 0937  TempSrc: Oral                 Rinda Rollyson J

## 2016-04-07 NOTE — Interval H&P Note (Signed)
History and Physical Interval Note:  04/07/2016 9:58 AM  [name]  has presented today for surgery, with the diagnosis of mass right thigh  The various methods of treatment have been discussed with the patient and family. After consideration of risks, benefits and other options for treatment, the patient has consented to  Procedure(s): EXCISION BIOPSY MASS RIGHT THIGH (Right) as a surgical intervention .  The patient's history has been reviewed, patient examined, no change in status, stable for surgery.  I have reviewed the patient's chart and labs.  Questions were answered to the patient's satisfaction.     Aviva Signs A

## 2016-04-07 NOTE — Op Note (Signed)
Patient:  MALEIYA MCCLENTON  DOB:  [birth year]  MRN:  [id number]   Preop Diagnosis:  Soft tissue mass, right thigh  Postop Diagnosis:  Same  Procedure:  Biopsy of soft tissue mass, right thigh  Surgeon:  Aviva Signs, M.D.  Anes:  Gen.  Indications:  Patient is a 30 year old black female who presents with an enlarging soft tissue mass in the right upper thigh. The risks and benefits of the procedure including bleeding, infection, and the possibility of malignancy were fully explained to the patient, who gave informed consent.  Procedure note:  The patient is placed the supine position. After general anesthesia was administered, the right upper anterior thigh was prepped and draped using the usual sterile technique with DuraPrep. Surgical site confirmation was performed.  An incision was made over the mass which was just inferior to the inguinal crease. The dissection was taken down to the mass which was in the subcutaneous tissue but not adherent to the underlying fascia. It was ovoid in nature and was greater than 6 cm in greatest diameter. Any small feeding blood vessels were ligated using 3-0 silk ties. The mass appeared to be lymphoid tissue. There was removed and sent to pathology further examination. The subcutaneous layer was reapproximated using 3-0 Vicryl interrupted suture. The skin was closed using a 4 Vicryl subcuticular suture. 0.5% Sensorcaine was instilled into the surrounding wound. Dermabond was then applied.  All tape and needle counts were correct at the end of the procedure. Patient was awakened and transferred to PACU in stable condition.  Complications:  None  EBL:  Minimal  Specimen:  Soft tissue mass, right thigh

## 2016-04-10 ENCOUNTER — Encounter (HOSPITAL_COMMUNITY): Payer: Self-pay | Admitting: General Surgery

## 2016-04-15 ENCOUNTER — Other Ambulatory Visit (HOSPITAL_COMMUNITY): Payer: Self-pay | Admitting: Oncology

## 2016-04-15 ENCOUNTER — Encounter (HOSPITAL_COMMUNITY): Payer: Self-pay | Admitting: Oncology

## 2016-04-15 DIAGNOSIS — C8105 Nodular lymphocyte predominant Hodgkin lymphoma, lymph nodes of inguinal region and lower limb: Secondary | ICD-10-CM

## 2016-04-15 DIAGNOSIS — C8100 Nodular lymphocyte predominant Hodgkin lymphoma, unspecified site: Secondary | ICD-10-CM | POA: Insufficient documentation

## 2016-04-15 HISTORY — DX: Nodular lymphocyte predominant Hodgkin lymphoma, unspecified site: C81.00

## 2016-04-16 NOTE — H&P (Signed)
  NTS SOAP Note  Vital Signs:  Vitals as of: [id number]: Systolic Q000111Q: Diastolic [id number]: Heart Rate 95: Temp 96.74F (Temporal): Height 55ft 4in: Weight 265Lbs 0 Ounces: Pain Level 5: BMI 45.49   BMI : 45.49 kg/m2  Subjective: This 30 year old female presents for of a right thigh mass.  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  Now presents for portacath insertion for chemotherapy.   Review of Symptoms:  Constitutional:fatigue headache Eyes:negative Nose/Mouth/Throat:negative Cardiovascular:negative Respiratory:negative Gastrointestinnegative Musculoskeletal:negativejoint, neck, and back pain abscess in groin and axillas swollen lymph nodes Allergic/Immunologic:negative   Past Medical History:Reviewed  Past Medical History  Surgical History: breast reconstruction, iud placement, left axillary surgery Medical Problems: hidradenitis axilas Allergies: nkda Medications: ibuprofen prn   Social History:Reviewed  Social History  Preferred Language: English Race:  Black or African American Ethnicity: Not Hispanic / Latino Age: 21 year Marital Status:  M Alcohol: no   Smoking Status: Never smoker reviewed on 04/03/2016 Functional Status reviewed on 04/03/2016 ------------------------------------------------ Bathing: Normal Cooking: Normal Dressing: Normal Driving: Normal Eating: Normal Managing Meds: Normal Oral Care: Normal Shopping: Normal Toileting: Normal Transferring: Normal Walking: Normal Cognitive Status reviewed on 04/03/2016 ------------------------------------------------ Attention: Normal Decision Making: Normal Language: Normal Memory: Normal Motor: Normal Perception: Normal Problem Solving: Normal Visual and Spatial: Normal   Family History:Reviewed  Family Health History Mother, Living; Hypertension (high blood pressure); Heart disease;  Father, Living; Diabetes mellitus, unspecified type; Stroke (CVA);     Objective  Information: Morbidly obese bf in NAD Head:Atraumatic; no masses; no abnormalities Neck:Supple without lymphadenopathy.  Heart:RRR, no murmur or gallop.  Normal S1, S2.  No S3, S4.  Lungs:CTA bilaterally, no wheezes, rhonchi, rales.  Breathing unlabored. Abdomen:Soft, NT/ND, no HSM, no masses. Palpable dp/pt pulses bilaterally.  Right upper inner thigh with ovoid 6x3cm mass present in subcutaneous tissue.  Somewhat mobile. Shoddy inguinal lymphadenopathy.  No appreciable axillary lymphadenopathy.  Assessment:Right thigh mass  Diagnoses: Hodgkin's lymphoma  Procedures: [id number] - OFFICE OUTPATIENT NEW 30 MINUTES    Plan:  Scheduled for portacath insertion on 04/28/16.   Patient Education:Alternative treatments to surgery were discussed with patient (and family).Risks and benefits  of procedure were fully explained to the patient (and family) who gave informed consent. Patient/family questions were addressed.  Follow-up:Pending Surgery

## 2016-04-23 ENCOUNTER — Encounter (HOSPITAL_COMMUNITY): Payer: 59 | Attending: Hematology & Oncology

## 2016-04-23 ENCOUNTER — Encounter (HOSPITAL_COMMUNITY)
Admission: RE | Admit: 2016-04-23 | Discharge: 2016-04-23 | Disposition: A | Discharge status: Home / Self Care | Payer: 59 | Source: Ambulatory Visit | Attending: Oncology | Admitting: Oncology

## 2016-04-23 ENCOUNTER — Encounter (HOSPITAL_COMMUNITY): Payer: Self-pay

## 2016-04-23 DIAGNOSIS — C8105 Nodular lymphocyte predominant Hodgkin lymphoma, lymph nodes of inguinal region and lower limb: Secondary | ICD-10-CM

## 2016-04-23 DIAGNOSIS — Z79899 Other long term (current) drug therapy: Secondary | ICD-10-CM | POA: Insufficient documentation

## 2016-04-23 DIAGNOSIS — R309 Painful micturition, unspecified: Secondary | ICD-10-CM | POA: Diagnosis not present

## 2016-04-23 LAB — CBC WITH DIFFERENTIAL/PLATELET
BASOS ABS: 0 10*3/uL (ref 0.0–0.1)
Basophils Relative: 0 %
EOS ABS: 0.5 10*3/uL (ref 0.0–0.7)
EOS PCT: 4 %
HCT: 41.6 % (ref 36.0–46.0)
Hemoglobin: 13.5 g/dL (ref 12.0–15.0)
LYMPHS ABS: 2.3 10*3/uL (ref 0.7–4.0)
LYMPHS PCT: 21 %
MCH: 26.8 pg (ref 26.0–34.0)
MCHC: 32.5 g/dL (ref 30.0–36.0)
MCV: 82.5 fL (ref 78.0–100.0)
MONO ABS: 0.8 10*3/uL (ref 0.1–1.0)
Monocytes Relative: 8 %
Neutro Abs: 7.1 10*3/uL (ref 1.7–7.7)
Neutrophils Relative %: 67 %
PLATELETS: 488 10*3/uL — AB (ref 150–400)
RBC: 5.04 MIL/uL (ref 3.87–5.11)
RDW: 13.8 % (ref 11.5–15.5)
WBC: 10.7 10*3/uL — AB (ref 4.0–10.5)

## 2016-04-23 LAB — LACTATE DEHYDROGENASE: LDH: 112 U/L (ref 98–192)

## 2016-04-23 LAB — COMPREHENSIVE METABOLIC PANEL
ALT: 14 U/L (ref 14–54)
AST: 13 U/L — ABNORMAL LOW (ref 15–41)
Albumin: 4 g/dL (ref 3.5–5.0)
Alkaline Phosphatase: 44 U/L (ref 38–126)
Anion gap: 8 (ref 5–15)
BUN: 10 mg/dL (ref 6–20)
CHLORIDE: 102 mmol/L (ref 101–111)
CO2: 28 mmol/L (ref 22–32)
Calcium: 9.6 mg/dL (ref 8.9–10.3)
Creatinine, Ser: 0.78 mg/dL (ref 0.44–1.00)
Glucose, Bld: 82 mg/dL (ref 65–99)
POTASSIUM: 3.7 mmol/L (ref 3.5–5.1)
SODIUM: 138 mmol/L (ref 135–145)
Total Bilirubin: 0.3 mg/dL (ref 0.3–1.2)
Total Protein: 7.8 g/dL (ref 6.5–8.1)

## 2016-04-23 LAB — HCG, SERUM, QUALITATIVE: Preg, Serum: NEGATIVE

## 2016-04-23 LAB — PREGNANCY, URINE: Preg Test, Ur: NEGATIVE

## 2016-04-23 LAB — SEDIMENTATION RATE: SED RATE: 11 mm/h (ref 0–22)

## 2016-04-23 LAB — C-REACTIVE PROTEIN: CRP: 3 mg/dL — AB (ref <1.0)

## 2016-04-23 MED ORDER — HEPARIN SOD (PORK) LOCK FLUSH 100 UNIT/ML IV SOLN
INTRAVENOUS | Status: AC
  Filled 2016-04-23: qty 5

## 2016-04-23 MED ORDER — SODIUM CHLORIDE 0.9% FLUSH
INTRAVENOUS | Status: AC
  Filled 2016-04-23: qty 50

## 2016-04-23 MED ORDER — TECHNETIUM TC 99M-LABELED RED BLOOD CELLS IV KIT
20.0000 | PACK | Freq: Once | INTRAVENOUS | Status: AC | PRN
  Administered 2016-04-23: 20 via INTRAVENOUS

## 2016-04-23 NOTE — Patient Instructions (Signed)
[name]  04/23/2016     @PREFPERIOPPHARMACY @   Your procedure is scheduled on 04/28/2016.  Report to [facility] at 7:00 A.M.  Call this number if you have problems the morning of surgery:  [phone]   Remember:  Do not eat food or drink liquids after midnight.  Take these medicines the morning of surgery with A SIP OF WATER : Norco   Do not wear jewelry, make-up or nail polish.  Do not wear lotions, powders, or perfumes, or deoderant.  Do not shave 48 hours prior to surgery.  Men may shave face and neck.  Do not bring valuables to the hospital.  [facility] is not responsible for any belongings or valuables.  Contacts, dentures or bridgework may not be worn into surgery.  Leave your suitcase in the car.  After surgery it may be brought to your room.  For patients admitted to the hospital, discharge time will be determined by your treatment team.  Patients discharged the day of surgery will not be allowed to drive home.   Name and phone number of your driver:   family Special instructions:  n/a  Please read over the following fact sheets that you were given. Care and Recovery After Surgery  Implanted Port Insertion An implanted port is a central line that has a round shape and is placed under the skin. It is used as a long-term IV access for:   Medicines, such as chemotherapy.   Fluids.   Liquid nutrition, such as total parenteral nutrition (TPN).   Blood samples.  LET [facility] CARE PROVIDER KNOW ABOUT:  Allergies to food or medicine.   Medicines taken, including vitamins, herbs, eye drops, creams, and over-the-counter medicines.   Any allergies to heparin.  Use of steroids (by mouth or creams).   Previous problems with anesthetics or numbing medicines.   History of bleeding problems or blood clots.   Previous surgery.   Other health problems, including diabetes and kidney problems.   Possibility of pregnancy, if this  applies. RISKS AND COMPLICATIONS Generally, this is a safe procedure. However, as with any procedure, problems can occur. Possible problems include:  Damage to the blood vessel, bruising, or bleeding at the puncture site.   Infection.  Blood clot in the vessel that the port is in.  Breakdown of the skin over your port.  Very rarely a person may develop a condition called a pneumothorax, a collection of air in the chest that may cause one of the lungs to collapse. The placement of these catheters with the appropriate imaging guidance significantly decreases the risk of a pneumothorax.  BEFORE THE PROCEDURE   Your health care provider may want you to have blood tests. These tests can help tell how well your kidneys and liver are working. They can also show how well your blood clots.   If you take blood thinners (anticoagulant medicines), ask your health care provider when you should stop taking them.   Make arrangements for someone to drive you home. This is necessary if you have been sedated for your procedure.  PROCEDURE  Port insertion usually takes about 30-45 minutes.   An IV needle will be inserted in your arm. Medicine for pain and medicine to help relax you (sedative) will flow directly into your body through this needle.   You will lie on an exam table, and you will be connected to monitors to keep track of your heart rate, blood pressure, and breathing throughout  the procedure.  An oxygen monitoring device may be attached to your finger. Oxygen will be given.   Everything will be kept as germ free (sterile) as possible during the procedure. The skin near the point of the incision will be cleansed with antiseptic, and the area will be draped with sterile towels. The skin and deeper tissues over the port area will be made numb with a local anesthetic.  Two small cuts (incisions) will be made in the skin to insert the port. One will be made in the neck to obtain access to  the vein where the catheter will lie.   Because the port reservoir will be placed under the skin, a small skin incision will be made in the upper chest, and a small pocket for the port will be made under the skin. The catheter that will be connected to the port tunnels to a large central vein in the chest. A small, raised area will remain on your body at the site of the reservoir when the procedure is complete.  The port placement will be done under imaging guidance to ensure the proper placement.  The reservoir has a silicone covering that can be punctured with a special needle.   The port will be flushed with normal saline, and blood will be drawn to make sure it is working properly.  There will be nothing remaining outside the skin when the procedure is finished.   Incisions will be held together by stitches, surgical glue, or a special tape. AFTER THE PROCEDURE  You will stay in a recovery area until the anesthesia has worn off. Your blood pressure and pulse will be checked.  A final chest X-ray will be taken to check the placement of the port and to ensure that there is no injury to your lung.   This information is not intended to replace advice given to you by your health care provider. Make sure you discuss any questions you have with your health care provider.   Document Released: 05/25/2013 Document Revised: 08/25/2014 Document Reviewed: 05/25/2013 Elsevier Interactive Patient Education [employer].

## 2016-04-24 ENCOUNTER — Encounter (HOSPITAL_COMMUNITY): Payer: Self-pay | Admitting: Hematology & Oncology

## 2016-04-24 ENCOUNTER — Encounter (HOSPITAL_BASED_OUTPATIENT_CLINIC_OR_DEPARTMENT_OTHER): Payer: 59 | Admitting: Hematology & Oncology

## 2016-04-24 ENCOUNTER — Encounter (HOSPITAL_COMMUNITY)
Admission: RE | Admit: 2016-04-24 | Discharge: 2016-04-24 | Disposition: A | Discharge status: Home / Self Care | Payer: 59 | Source: Ambulatory Visit | Attending: General Surgery | Admitting: General Surgery

## 2016-04-24 VITALS — BP 165/104 | HR 112 | Temp 98.7°F | Resp 20 | Ht 62.75 in | Wt 270.8 lb

## 2016-04-24 DIAGNOSIS — L732 Hidradenitis suppurativa: Secondary | ICD-10-CM | POA: Diagnosis not present

## 2016-04-24 DIAGNOSIS — Z5111 Encounter for antineoplastic chemotherapy: Secondary | ICD-10-CM

## 2016-04-24 DIAGNOSIS — C8105 Nodular lymphocyte predominant Hodgkin lymphoma, lymph nodes of inguinal region and lower limb: Secondary | ICD-10-CM | POA: Diagnosis not present

## 2016-04-24 DIAGNOSIS — R530 Neoplastic (malignant) related fatigue: Secondary | ICD-10-CM

## 2016-04-24 DIAGNOSIS — E669 Obesity, unspecified: Secondary | ICD-10-CM

## 2016-04-24 LAB — HEPATITIS PANEL, ACUTE
HEP A IGM: NEGATIVE
HEP B S AG: NEGATIVE
Hep B C IgM: NEGATIVE

## 2016-04-24 LAB — HIV ANTIBODY (ROUTINE TESTING W REFLEX): HIV Screen 4th Generation wRfx: NONREACTIVE

## 2016-04-24 MED ORDER — ALPRAZOLAM 0.5 MG PO TABS: 0.5000 mg | ORAL_TABLET | Freq: Three times a day (TID) | ORAL | 0 refills | Status: DC | PRN

## 2016-04-24 NOTE — Progress Notes (Signed)
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   Patient is accompanied by mother, mother-in-law, and husband.   [name] states that she has been extremely fatigued and achy for about two years. In May, she began experiencing excessive vaginal bleeding. She went on a [place] trip and says she bled the whole time. Her L sided abdominal pain continued to worsen and she presented to the ED her at [facility] on 03/26/2016. CT of the abdomen and pelvis was performed which showed:5.2 x 5.0 cm well-circumscribed soft tissue lesion within the right inguinal region, partially visualized, but suspected to reflect an enlarged lymph node. 2. Additional enlarged and prominent bilateral inguinal and iliac nodes as above, largest of which is positioned at the right external iliac position and measures 5.1 x 2.5 cm. While these findings could conceivably be reactive in nature, possible lymphoproliferative disorder should be considered.   She underwent an Excisional biopsy with Dr. Arnoldo Morale on 04/07/2016.  She is currently taking birth control to manage her irregular bleeding. She was told that the bleeding could be the result of a thinning uterine wall.   She has done a lot of reading about hodgkin lymphoma. She is aware of her path results.   She says she believes she is done having children. She has some knowledge about chemotherapy and  infertility.   She will be getting a port in Monday.  [name] has Hidradenitis suppurativa and Dr. Modena Nunnery is treating her. She states that sometimes symptoms are bad and sometimes they are not. She mentions that there are some areas that concern her and she is not sure if it is Hidradenitis suppurativa or something else. She says the bumps are superficial.  Patient had MUGA yesterday. She denies weight loss or fever. No change in appetite. She works full time for [employer] but from home.   MEDICAL HISTORY:  Past Medical History:  Diagnosis Date  . Asthma    had since childhood; has not had to use inhaler or nebulizer for 8-9 years. Been doing well.  Marland Kitchen Headache(784.0)    usually controlled c Tylenol when not pregnant, but more painful headaches  during preg. due to hypertension.  . Hidradenitis suppurativa   . 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 Hypertension December 2011   Been on [place] since Dec. 2011  . Sleep apnea     SURGICAL HISTORY: Past Surgical History:  Procedure Laterality Date  . ADENOIDECTOMY     age 54  . BREAST RECONSTRUCTION  December 2001   breast reduction at age 52  . MASS EXCISION Right 04/07/2016   Procedure: EXCISION/BIOPSY SOFT TISSUE MASS RIGHT THIGH;  Surgeon: Aviva Signs, MD;  Location: [facility];  Service: General;  Laterality: Right;    SOCIAL HISTORY: Social History   Social History  . Marital status: Married    Spouse name: N/A  . Number of children:  N/A  . Years of education: N/A   Occupational History  . Not on file.   Social History Main Topics  . Smoking status: Never Smoker  . Smokeless tobacco: Never Used  . Alcohol use No  . Drug use: No  . Sexual activity: Yes    Birth control/ protection: IUD   Other Topics Concern  . Not on file   Social History Narrative  . No narrative on file  She has two children. One is 4 and one is 13. She also has a dog. She like to dance.  She has been  married 8 years, but has been with her husband a total of 18 years.  Patient works from home for [employer].  FAMILY HISTORY: Family History  Problem Relation Age of Onset  . Hypertension Mother   . Cancer Mother   . Hypertension Father   . Heart disease Maternal Grandmother     ALLERGIES:  has No Known Allergies.  MEDICATIONS:  Current Outpatient Prescriptions  Medication Sig Dispense Refill  . HYDROcodone-acetaminophen (NORCO) 5-325 MG tablet Take 1-2 tablets by mouth every 4 (four) hours as needed for moderate pain. 50 tablet 0  . ibuprofen (ADVIL,MOTRIN) 200 MG tablet Take 400-600 mg by mouth every 6 (six) hours as needed for moderate pain.    Marland Kitchen levonorgestrel (MIRENA) 20 MCG/24HR IUD 1 each by Intrauterine route once.    Marland Kitchen UNABLE TO FIND Med Name: Birth control pill    . Vitamin D, Ergocalciferol, (DRISDOL) 50000 units CAPS capsule Take 50,000 Units by mouth every 7 (seven) days.    . ALPRAZolam (XANAX) 0.5 MG tablet Take 1 tablet (0.5 mg total) by mouth 3 (three) times daily as needed for anxiety. 15 tablet 0   No current facility-administered medications for this visit.     Review of Systems  Constitutional: Positive for malaise/fatigue.       Fatigued for about two years  HENT: Negative.   Eyes: Negative.   Respiratory: Negative.   Cardiovascular: Negative.   Gastrointestinal: Negative.   Genitourinary: Negative.   Musculoskeletal: Negative.   Skin: Negative.        Bump on abdomen   Neurological: Negative.   Endo/Heme/Allergies: Negative.   Psychiatric/Behavioral: Negative.   All other systems reviewed and are negative.  14 point ROS was done and is otherwise as detailed above or in HPI    PHYSICAL EXAMINATION: ECOG PERFORMANCE STATUS: 1 - Symptomatic but completely ambulatory  Vitals:   04/24/16 1445  BP: (!) 165/104  Pulse: (!) 112  Resp: 20  Temp: 98.7 F (37.1 C)   Filed Weights   04/24/16 1445  Weight: 270 lb 12.8 oz (122.8 kg)      Physical Exam  Constitutional: She is oriented to person, place, and time and well-developed, well-nourished, and in no distress.  Obese  HENT:  Head: Normocephalic and atraumatic.  Nose: Nose normal.  Mouth/Throat: Oropharynx is clear and moist. No oropharyngeal exudate.  Eyes: Conjunctivae and EOM are normal. Pupils are equal, round, and reactive to light. Right eye exhibits no discharge. Left eye exhibits no discharge. No scleral icterus.  Neck: Normal range of motion. Neck supple. No tracheal deviation present. No thyromegaly present.  Cardiovascular: Normal rate, regular rhythm and normal heart sounds.  Exam reveals no gallop and no friction rub.   No murmur heard. Pulmonary/Chest: Effort normal and breath sounds normal. She has no wheezes. She has no rales.  Abdominal: Soft. Bowel sounds are normal. She exhibits  no distension and no mass. There is no tenderness. There is no rebound and no guarding.  Musculoskeletal: Normal range of motion. She exhibits no edema.  R thigh with well healing incision site  Lymphadenopathy:    She has no cervical adenopathy.  Neurological: She is alert and oriented to person, place, and time. She has normal reflexes. No cranial nerve deficit. Gait normal. Coordination normal.  Skin: Skin is warm and dry. No rash noted.  Psychiatric: Mood, memory, affect and judgment normal.  Nursing note and vitals reviewed.   LABORATORY DATA:  I have reviewed the data as listed Lab Results  Component Value Date   WBC 10.7 (H) 04/23/2016   HGB 13.5 04/23/2016   HCT 41.6 04/23/2016   MCV 82.5 04/23/2016   PLT 488 (H) 04/23/2016   CMP     Component Value Date/Time   NA 138 04/23/2016 1437   K 3.7 04/23/2016 1437   CL 102 04/23/2016 1437   CO2 28 04/23/2016 1437   GLUCOSE 82 04/23/2016 1437   BUN 10 04/23/2016 1437   CREATININE 0.78 04/23/2016 1437   CREATININE 0.78 02/16/2013 1721   CALCIUM 9.6 04/23/2016 1437   PROT 7.8 04/23/2016 1437   ALBUMIN  4.0 04/23/2016 1437   AST 13 (L) 04/23/2016 1437   ALT 14 04/23/2016 1437   ALKPHOS 44 04/23/2016 1437   BILITOT 0.3 04/23/2016 1437   GFRNONAA >60 04/23/2016 1437   GFRNONAA >89 02/16/2013 1721   GFRAA >60 04/23/2016 1437   GFRAA >89 02/16/2013 1721    PATHOLOGY:    RADIOGRAPHIC STUDIES: I have personally reviewed the radiological images as listed and agreed with the findings in the report. Nm Pet Image Initial (pi) Skull Base To Thigh  Result Date: 04/25/2016 CLINICAL DATA:  Initial treatment strategy for Hodgkin's lymphoma of the right inguinal region. EXAM: NUCLEAR MEDICINE PET SKULL BASE TO THIGH TECHNIQUE: 13.5 mCi F-18 FDG was injected intravenously. Full-ring PET imaging was performed from the skull base to thigh after the radiotracer. CT data was obtained and used for attenuation correction and anatomic localization. FASTING BLOOD GLUCOSE:  Value: 89 mg/dl COMPARISON:  03/26/2016 CT abdomen/ pelvis. FINDINGS: NECK No hypermetabolic lymph nodes in the neck. There is symmetric intense hypermetabolism in Waldeyer's ring with associated soft tissue thickening on the CT images (max SUV 18.5). CHEST There are relatively symmetric enlarged hypermetabolic axillary lymph nodes bilaterally, with representative axillary nodes as follows: - right axillary 1.5 cm node with max SUV 6.7 (series 4/ image 50) - left axillary 1.3 cm node with max SUV 7.2 (series 4/ image 55) There are scattered cutaneous foci of hypermetabolism in the bilateral axilla and bilateral lower chest wall with associated skin thickening as follows: - right axillary skin thickening with max SUV 9.1 (series 4/ image 44) - left axillary skin thickening with max SUV 5.8 (series 4/ image 55) - right lower breast skin thickening with max SUV 15.6 (series 4/image 80) - medial ventral lower left chest wall skin thickening with max SUV 10.5 (series 4/ image 81) No hypermetabolic mediastinal or hilar lymph nodes. No pleural effusions. No  acute consolidative airspace disease, lung masses or significant pulmonary nodules. ABDOMEN/PELVIS There are enlarged hypermetabolic bilateral external iliac and bilateral inguinal lymph nodes, with representative nodes as follows: - right external iliac 2.3 cm node with max SUV 13.9 (series 4/image 169) - left external iliac 1.4 cm node with max SUV 6.3 (series 4/image 163) - right inguinal 1.5 cm node with max SUV  13.4 (series 4/image 185) - left inguinal 1.3 cm node with max SUV 7.4 (series 4/image 190) There is a 6.4 x 4.2 cm cystic focus in the right inguinal region with overlying fat stranding and peripheral hypermetabolism (series 4/ image 187), which is nonspecific, favor a seroma with post biopsy change, less likely a necrotic node. No abnormal hypermetabolic activity within the liver, pancreas, adrenal glands, or spleen. No hypermetabolic lymph nodes in the abdomen. Intrauterine device appears obliquely oriented within the uterus. SKELETON There is diffuse hypermetabolism throughout the axial skeleton, for example max SUV 7.6 in the L3 vertebral body. No discrete bone lesions on the CT images. IMPRESSION: 1. Hypermetabolic enlarged bilateral axillary, bilateral external iliac and bilateral inguinal lymph nodes consistent with lymphoma. 2. Hypermetabolic foci of skin thickening in the bilateral axilla and bilateral lower ventral chest wall, nonspecific, cannot exclude cutaneous lymphoma. 3. Hypermetabolism throughout Waldeyer's ring with associated mucosal thickening on the CT images, which could be inflammatory or due to lymphoma. 4. No splenic enlargement or hypermetabolism. 5. Diffuse hypermetabolism throughout the axial skeleton without discrete bone lesions on the CT images, worrisome for diffuse osseous involvement by lymphoma. 6. Intrauterine device appears obliquely oriented within the uterus, which is nonspecific. Proper position of the IUD cannot be definitively assessed by CT. Consider  correlation with transabdominal and transvaginal pelvic ultrasound. Electronically Signed   By: Ilona Sorrel M.D.   On: 04/25/2016 12:56    Study Result   CLINICAL DATA:  Initial evaluation for gradually worsening left-sided abdominal pain for 3 days.  EXAM: CT ABDOMEN AND PELVIS WITHOUT CONTRAST  TECHNIQUE: Multidetector CT imaging of the abdomen and pelvis was performed following the standard protocol without IV contrast.  COMPARISON:  None.  FINDINGS: Partially visualized lung bases are clear. Liver demonstrates a normal unenhanced appearance. Gallbladder contracted without acute abnormality. No biliary dilatation. Spleen, adrenal glands, and pancreas within normal limits. Focal fat interposed at the pancreatic head noted.  Kidneys are equal in size without evidence of nephrolithiasis or hydronephrosis. No radiopaque calculi seen along the course of either renal collecting system. There is no hydroureter.  Stomach within normal limits. No evidence for bowel obstruction. Appendix within normal limits. No abnormal wall thickening or inflammatory fat stranding seen about the bowels. Moderate fecal impaction noted within the rectal vault.  Bladder decompressed without acute abnormality. No layering stones within the bladder lumen.  IUD in place within the uterus. Uterus otherwise unremarkable. Ovaries within normal limits.  No free air or fluid. No pathologically enlarged lymph nodes identified within the abdomen. There is a 5.2 x 5.0 cm soft tissue lesion within the right inguinal region (series 2, image 100), likely an enlarged lymph node. There are a few adjacent mildly prominent right inguinal nodes measuring up to 1.4 cm in diameter. Enlarged right external iliac node measures 5.1 x 2.5 cm (series 2, image 81). Additional right external iliac nodes measure up to 12 mm in short axis. Left inguinal lymph nodes measure up to 12 mm. Left external iliac node  measures up to 11 mm (series 2, image 76).  No acute osseous abnormality. No worrisome lytic or blastic osseous lesions.  IMPRESSION: 1. 5.2 x 5.0 cm well-circumscribed soft tissue lesion within the right inguinal region, partially visualized, but suspected to reflect an enlarged lymph node. Correlation with physical exam recommended. 2. Additional enlarged and prominent bilateral inguinal and iliac nodes as above, largest of which is positioned at the right external iliac position and measures 5.1 x 2.5  cm. While these findings could conceivably be reactive in nature, possible lymphoproliferative disorder should be considered. 3. No CT evidence for nephrolithiasis or obstructive uropathy. 4. No other acute intra-abdominal or pelvic process.   Electronically Signed   By: Jeannine Boga M.D.   On: 03/26/2016 23:52    ASSESSMENT & PLAN:  Hodgkin Lymphoma Nodular Lymphocyte Predominant Disease No B symptoms Fatigue Normal LDH Normal ESR  The patient has been reading about Hodgkin Lymphoma and is quite educated today. She presents with her husband, mother and mother-in-law.   We discussed issues pertaining to fertility and chemotherapy. She and her husband are not interested in having more children. I did address that chemotherapy does not always guarantee prevention of pregnancy in the future.  Staging is not complete. PET is on Friday. MUGA has been performed but is not read. She will have her port placed on Monday.   Depending on her stage we will have to discuss optimal chemotherapy. There is unfortunately limited data regarding optimal chemotherapy for NLPHL, some evidence suggesting alkylating agents are better options in more advanced disease. We can address this moving forward. Rituxan will be part of her therapy, hepatitis panel has been performed and is negative.   She already knows Anderson Malta our navigator, but will have her touch base with her today.   FROM  UP TO DATE: Combination chemotherapy is the main treatment for patients with stage III/IV NLPHL. When treated with alkylator-based regimens, adults with NLPHL respond to treatment in a similar fashion to patients with cHL and identical clinical stage. There are limited data regarding the best chemotherapy regimen for this population, and clinical practice varies. For most patients with advanced disease, we suggest the use of R-CHOP (rituximab, cyclophosphamide, doxorubicin, vincristine, and prednisone) (table 3) rather than ABVD (Grade 2C). This preference is primarily based upon our clinical experience, the desire to use rituximab in a CD20 positive tumor, and limited data that suggest that ABVD (doxorubicin, bleomycin, vinblastine, and dacarbazine) is not as effective as other alkylator-based regimens. (See 'Stage III/IV disease' above.) ?Approximately 15 to 30 percent of patients with NLPHL relapse. Relapsed disease tends to be responsive to further chemotherapy and/or involved-node or involved-site RT. Patients with relapsed disease should be restaged and treated accordingly.  Combination chemotherapy is the main treatment for patients with stage III/IV NLPHL. When treated with alkylator-based regimens, adults with NLPHL respond to treatment in a similar fashion to patients with cHL and identical clinical stage. There are limited data regarding the best chemotherapy regimen for this population, and clinical practice varies. For most patients with advanced disease, we suggest the use of R-CHOP (rituximab, cyclophosphamide, doxorubicin, vincristine, and prednisone) (table 3) rather than ABVD (Grade 2C). This preference is primarily based upon our clinical experience, the desire to use rituximab in a CD20 positive tumor, and limited data that suggest that ABVD (doxorubicin, bleomycin, vinblastine, and dacarbazine) is not as effective as other alkylator-based regimens. (See 'Stage III/IV disease'  above.) ?Approximately 15 to 30 percent of patients with NLPHL relapse. Relapsed disease tends to be responsive to further chemotherapy and/or involved-node or involved-site RT. Patients with relapsed disease should be restaged and treated accordingly.  Combination chemotherapy is the main treatment for patients with stage III/IV NLPHL. When treated with alkylator-based regimens, adults with NLPHL respond to treatment in a similar fashion to patients with cHL and identical clinical stage. There are limited data regarding the best chemotherapy regimen for this population, and clinical practice varies. For most patients with advanced  disease, we suggest the use of R-CHOP (rituximab, cyclophosphamide, doxorubicin, vincristine, and prednisone) (table 3) rather than ABVD (Grade 2C). This preference is primarily based upon our clinical experience, the desire to use rituximab in a CD20 positive tumor, and limited data that suggest that ABVD (doxorubicin, bleomycin, vinblastine, and dacarbazine) is not as effective as other alkylator-based regimens. (See 'Stage III/IV disease' above.) ?Approximately 15 to 30 percent of patients with NLPHL relapse. Relapsed disease tends to be responsive to further chemotherapy and/or involved-node or involved-site RT. Patients with relapsed disease should be restaged and treated accordingly.   How I treat nodular lymphocyte predominant Hodgkin lymphoma Ranjana H. Advani and Richard T. Saltillo  Blood 2013 122:[id number]; doi: ThisMLS.nl   At [facility], patients with advanced disease are treated with curative intent. Given the universal expression of CD20, our usual first-line approach incorporates rituximab with regimens used for cHL. For patients who present with B symptoms or abdominal involvement, scenarios in which there may be occult transformation, we favor using RCHOP for 6 cycles NLPHL is rare, with few prospective trials to guide therapy. RT  continues to play an integral role in stage I to II disease, as does chemotherapy in advanced stages. The optimal chemotherapy is debatable, and some data suggest that including an alkylating agent may improve efficacy. Although the results with single-agent rituximab in the front-line setting are inferior to conventional therapy, rituximab is a reasonable choice for patients with relapsed disease. The inherent risk of developing an aggressive B-cell lymphoma underscores the importance of long-term follow-up, as well as rebiopsy at relapse. Randomized trials are likely not feasible because of the rarity of NLPHL; however, strategies to consider testing include "watch and wait" for selected patients, such as young children with limited asymptomatic disease or individuals with totally excised solitary nodal involvement, and combinations of targeted therapy, such as rituximab or other anti-CD20 monoclonal antibodies with conventional therapy. The ultimate goal must be maintenance of excellent PFS and minimization of risk for late effects  ORDERS PLACED FOR THIS ENCOUNTER: Orders Placed This Encounter  Procedures  . Pulmonary Function Test    MEDICATIONS PRESCRIBED THIS ENCOUNTER: Meds ordered this encounter  Medications  . Vitamin D, Ergocalciferol, (DRISDOL) 50000 units CAPS capsule    Sig: Take 50,000 Units by mouth every 7 (seven) days.  Marland Kitchen UNABLE TO FIND    Sig: Med Name: Birth control pill  . ALPRAZolam (XANAX) 0.5 MG tablet    Sig: Take 1 tablet (0.5 mg total) by mouth 3 (three) times daily as needed for anxiety.    Dispense:  15 tablet    Refill:  0    I wrote her a prescription for hair prosthesis.   All questions were answered. The patient knows to call the clinic with any problems, questions or concerns.  This document serves as a record of services personally performed by Ancil Linsey, MD. It was created on her behalf by Elmyra Ricks, a trained medical scribe. The creation of this  record is based on the scribe's personal observations and the provider's statements to them. This document has been checked and approved by the attending provider.  I have reviewed the above documentation for accuracy and completeness, and I agree with the above.  This note was electronically signed.    Molli Hazard, MD  04/27/2016 5:12 PM

## 2016-04-24 NOTE — Patient Instructions (Signed)
[facility] at [facility] Discharge Instructions  RECOMMENDATIONS MADE BY THE CONSULTANT AND ANY TEST RESULTS WILL BE SENT TO YOUR REFERRING PHYSICIAN.  You saw Dr. Penland today.  Thank you for choosing  Cancer Center at [facility] to provide your oncology and hematology care.  To afford each patient quality time with our provider, please arrive at least 15 minutes before your scheduled appointment time.   Beginning January 23rd 2017 lab work for the Cancer Center will be done in the  Main lab at [place] on 1st floor. If you have a lab appointment with the Cancer Center please come in thru the  Main Entrance and check in at the main information desk  You need to re-schedule your appointment should you arrive 10 or more minutes late.  We strive to give you quality time with our providers, and arriving late affects you and other patients whose appointments are after yours.  Also, if you no show three or more times for appointments you may be dismissed from the clinic at the providers discretion.     Again, thank you for choosing [facility].  Our hope is that these requests will decrease the amount of time that you wait before being seen by our physicians.       _____________________________________________________________  Should you have questions after your visit to  Cancer Center, please contact our office at [phone] between the hours of 8:30 a.m. and 4:30 p.m.  Voicemails left after 4:30 p.m. will not be returned until the following business day.  For prescription refill requests, have your pharmacy contact our office.         Resources For Cancer Patients and their Caregivers ? American Cancer Society: Can assist with transportation, wigs, general needs, runs Look Good Feel Better.        1-888-227-6333 ? Cancer Care: Provides financial assistance, online support groups, medication/co-pay assistance.   1-800-813-HOPE (4673) ? [facility] Assists [place] cancer patients and their families through emotional , educational and financial support.  336-427-4357 ? [place] DSS Where to apply for food stamps, Medicaid and utility assistance. 336-342-1394 ? [facility]: Transportation to medical appointments. 336-347-2287 ? Social Security Administration: May apply for disability if have a Stage IV cancer. 336-342-7796 1-800-772-1213 ? [place] Aging, Disability and Transit Services: Assists with nutrition, care and transit needs. 336-349-2343  Cancer Center Support Programs: @10RELATIVEDAYS@ > Cancer Support Group  2nd Tuesday of the month 1pm-2pm, Journey Room  > Creative Journey  3rd Tuesday of the month 1130am-1pm, Journey Room  > Look Good Feel Better  1st Wednesday of the month 10am-12 noon, Journey Room (Call American Cancer Society to register 1-800-395-5775)    

## 2016-04-25 ENCOUNTER — Telehealth (HOSPITAL_COMMUNITY): Payer: Self-pay | Admitting: Emergency Medicine

## 2016-04-25 ENCOUNTER — Encounter (HOSPITAL_COMMUNITY)
Admission: RE | Admit: 2016-04-25 | Discharge: 2016-04-25 | Disposition: A | Discharge status: Home / Self Care | Payer: 59 | Source: Ambulatory Visit | Attending: Oncology | Admitting: Oncology

## 2016-04-25 DIAGNOSIS — C8105 Nodular lymphocyte predominant Hodgkin lymphoma, lymph nodes of inguinal region and lower limb: Secondary | ICD-10-CM | POA: Insufficient documentation

## 2016-04-25 LAB — GLUCOSE, CAPILLARY: GLUCOSE-CAPILLARY: 83 mg/dL (ref 65–99)

## 2016-04-25 MED ORDER — FLUDEOXYGLUCOSE F - 18 (FDG) INJECTION
13.4900 | Freq: Once | INTRAVENOUS | Status: AC | PRN
  Administered 2016-04-25: 13.49 via INTRAVENOUS

## 2016-04-25 NOTE — Telephone Encounter (Signed)
Called pt to tell her about her PET scan that more than likely Stage 3 but Dr Whitney Muse was going to talk with the radiologist.  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  We may have to change what treatment that she is going to be receiving to [facility] but I would let her know next week.  She verbalized understanding.

## 2016-04-25 NOTE — Telephone Encounter (Signed)
Tried to call pt could not leave message mailbox was full

## 2016-04-28 ENCOUNTER — Other Ambulatory Visit (HOSPITAL_COMMUNITY): Payer: Self-pay | Admitting: Emergency Medicine

## 2016-04-28 ENCOUNTER — Encounter (HOSPITAL_COMMUNITY): Payer: Self-pay | Admitting: *Deleted

## 2016-04-28 ENCOUNTER — Encounter (HOSPITAL_COMMUNITY)
Admission: RE | Disposition: A | Discharge status: Home / Self Care | Payer: Self-pay | Source: Ambulatory Visit | Attending: General Surgery

## 2016-04-28 ENCOUNTER — Ambulatory Visit (HOSPITAL_COMMUNITY): Payer: 59

## 2016-04-28 ENCOUNTER — Telehealth (HOSPITAL_COMMUNITY): Payer: Self-pay | Admitting: Emergency Medicine

## 2016-04-28 ENCOUNTER — Ambulatory Visit (HOSPITAL_COMMUNITY): Payer: 59 | Admitting: Anesthesiology

## 2016-04-28 ENCOUNTER — Other Ambulatory Visit (HOSPITAL_COMMUNITY): Payer: Self-pay | Admitting: Hematology & Oncology

## 2016-04-28 ENCOUNTER — Ambulatory Visit (HOSPITAL_COMMUNITY)
Admission: RE | Admit: 2016-04-28 | Discharge: 2016-04-28 | Disposition: A | Discharge status: Home / Self Care | Payer: 59 | Source: Ambulatory Visit | Attending: General Surgery | Admitting: General Surgery

## 2016-04-28 DIAGNOSIS — I10 Essential (primary) hypertension: Secondary | ICD-10-CM | POA: Insufficient documentation

## 2016-04-28 DIAGNOSIS — Z95828 Presence of other vascular implants and grafts: Secondary | ICD-10-CM

## 2016-04-28 DIAGNOSIS — Z6841 Body Mass Index (BMI) 40.0 and over, adult: Secondary | ICD-10-CM | POA: Diagnosis not present

## 2016-04-28 DIAGNOSIS — C8195 Hodgkin lymphoma, unspecified, lymph nodes of inguinal region and lower limb: Secondary | ICD-10-CM | POA: Insufficient documentation

## 2016-04-28 DIAGNOSIS — G4730 Sleep apnea, unspecified: Secondary | ICD-10-CM | POA: Diagnosis not present

## 2016-04-28 HISTORY — PX: PORTACATH PLACEMENT: SHX2246

## 2016-04-28 SURGERY — INSERTION, TUNNELED CENTRAL VENOUS DEVICE, WITH PORT
Anesthesia: General | Site: Chest | Laterality: Left

## 2016-04-28 MED ORDER — OXYCODONE HCL 5 MG PO TABS: 5.0000 mg | ORAL_TABLET | Freq: Once | ORAL | Status: DC | PRN

## 2016-04-28 MED ORDER — CEFAZOLIN SODIUM-DEXTROSE 2-4 GM/100ML-% IV SOLN
INTRAVENOUS | Status: AC
  Filled 2016-04-28: qty 100

## 2016-04-28 MED ORDER — HEPARIN SOD (PORK) LOCK FLUSH 100 UNIT/ML IV SOLN
INTRAVENOUS | Status: DC | PRN
  Administered 2016-04-28: 500 [IU]

## 2016-04-28 MED ORDER — KETOROLAC TROMETHAMINE 30 MG/ML IJ SOLN
30.0000 mg | Freq: Once | INTRAMUSCULAR | Status: AC
  Administered 2016-04-28: 30 mg via INTRAVENOUS
  Filled 2016-04-28: qty 1

## 2016-04-28 MED ORDER — PROPOFOL 10 MG/ML IV BOLUS
INTRAVENOUS | Status: DC | PRN
  Administered 2016-04-28: 200 mg via INTRAVENOUS

## 2016-04-28 MED ORDER — LIDOCAINE HCL (CARDIAC) 10 MG/ML IV SOLN
INTRAVENOUS | Status: DC | PRN
  Administered 2016-04-28: 30 mg via INTRAVENOUS

## 2016-04-28 MED ORDER — LIDOCAINE HCL (PF) 1 % IJ SOLN
INTRAMUSCULAR | Status: DC | PRN
  Administered 2016-04-28: 5 mL

## 2016-04-28 MED ORDER — CHLORHEXIDINE GLUCONATE CLOTH 2 % EX PADS: 6.0000 | MEDICATED_PAD | Freq: Once | CUTANEOUS | Status: DC

## 2016-04-28 MED ORDER — FENTANYL CITRATE (PF) 100 MCG/2ML IJ SOLN
INTRAMUSCULAR | Status: DC | PRN
  Administered 2016-04-28: 50 ug via INTRAVENOUS
  Administered 2016-04-28 (×4): 25 ug via INTRAVENOUS

## 2016-04-28 MED ORDER — LACTATED RINGERS IV SOLN
INTRAVENOUS | Status: DC
  Administered 2016-04-28: 09:00:00 via INTRAVENOUS

## 2016-04-28 MED ORDER — HEPARIN SOD (PORK) LOCK FLUSH 100 UNIT/ML IV SOLN
INTRAVENOUS | Status: AC
  Filled 2016-04-28: qty 5

## 2016-04-28 MED ORDER — OXYCODONE HCL 5 MG/5ML PO SOLN: 5.0000 mg | Freq: Once | ORAL | Status: DC | PRN

## 2016-04-28 MED ORDER — LIDOCAINE HCL (PF) 1 % IJ SOLN
INTRAMUSCULAR | Status: AC
  Filled 2016-04-28: qty 30

## 2016-04-28 MED ORDER — FENTANYL CITRATE (PF) 100 MCG/2ML IJ SOLN
INTRAMUSCULAR | Status: AC
  Filled 2016-04-28: qty 2

## 2016-04-28 MED ORDER — HYDROCODONE-ACETAMINOPHEN 5-325 MG PO TABS: 1.0000 | ORAL_TABLET | ORAL | 0 refills | Status: DC | PRN

## 2016-04-28 MED ORDER — ONDANSETRON HCL 4 MG/2ML IJ SOLN: 4.0000 mg | Freq: Four times a day (QID) | INTRAMUSCULAR | Status: DC | PRN

## 2016-04-28 MED ORDER — CEFAZOLIN SODIUM-DEXTROSE 2-4 GM/100ML-% IV SOLN
2.0000 g | INTRAVENOUS | Status: AC
  Administered 2016-04-28: 2 g via INTRAVENOUS

## 2016-04-28 MED ORDER — PROPOFOL 10 MG/ML IV BOLUS
INTRAVENOUS | Status: AC
  Filled 2016-04-28: qty 20

## 2016-04-28 MED ORDER — LIDOCAINE HCL (PF) 1 % IJ SOLN
INTRAMUSCULAR | Status: AC
  Filled 2016-04-28: qty 5

## 2016-04-28 MED ORDER — FENTANYL CITRATE (PF) 100 MCG/2ML IJ SOLN: 25.0000 ug | INTRAMUSCULAR | Status: DC | PRN

## 2016-04-28 SURGICAL SUPPLY — 32 items
ADH SKN CLS APL DERMABOND .7 (GAUZE/BANDAGES/DRESSINGS) ×1
BAG DECANTER FOR FLEXI CONT (MISCELLANEOUS) ×2 IMPLANT
BAG HAMPER (MISCELLANEOUS) ×2 IMPLANT
CHLORAPREP W/TINT 10.5 ML (MISCELLANEOUS) ×2 IMPLANT
CLOTH BEACON ORANGE TIMEOUT ST (SAFETY) ×2 IMPLANT
COVER LIGHT HANDLE STERIS (MISCELLANEOUS) ×4 IMPLANT
DECANTER SPIKE VIAL GLASS SM (MISCELLANEOUS) ×2 IMPLANT
DERMABOND ADVANCED (GAUZE/BANDAGES/DRESSINGS) ×1
DERMABOND ADVANCED .7 DNX12 (GAUZE/BANDAGES/DRESSINGS) ×1 IMPLANT
DRAPE C-ARM FOLDED MOBILE STRL (DRAPES) ×2 IMPLANT
ELECT REM PT RETURN 9FT ADLT (ELECTROSURGICAL) ×2
ELECTRODE REM PT RTRN 9FT ADLT (ELECTROSURGICAL) ×1 IMPLANT
GLOVE BIOGEL PI IND STRL 7.0 (GLOVE) ×1 IMPLANT
GLOVE BIOGEL PI INDICATOR 7.0 (GLOVE) ×2
GLOVE ECLIPSE 6.5 STRL STRAW (GLOVE) ×1 IMPLANT
GLOVE SURG SS PI 7.5 STRL IVOR (GLOVE) ×2 IMPLANT
GOWN STRL REUS W/TWL LRG LVL3 (GOWN DISPOSABLE) ×4 IMPLANT
IV NS 500ML (IV SOLUTION) ×2
IV NS 500ML BAXH (IV SOLUTION) ×1 IMPLANT
KIT PORT POWER 8FR ISP MRI (Port) ×2 IMPLANT
KIT ROOM TURNOVER APOR (KITS) ×2 IMPLANT
MANIFOLD NEPTUNE II (INSTRUMENTS) ×2 IMPLANT
NDL HYPO 25X1 1.5 SAFETY (NEEDLE) ×1 IMPLANT
NEEDLE HYPO 25X1 1.5 SAFETY (NEEDLE) ×2 IMPLANT
PACK MINOR (CUSTOM PROCEDURE TRAY) ×2 IMPLANT
PAD ARMBOARD 7.5X6 YLW CONV (MISCELLANEOUS) ×2 IMPLANT
SET BASIN LINEN APH (SET/KITS/TRAYS/PACK) ×2 IMPLANT
SUT VIC AB 3-0 SH 27 (SUTURE) ×2
SUT VIC AB 3-0 SH 27X BRD (SUTURE) ×1 IMPLANT
SUT VIC AB 4-0 PS2 27 (SUTURE) ×2 IMPLANT
SYR 20CC LL (SYRINGE) ×2 IMPLANT
SYR CONTROL 10ML LL (SYRINGE) ×2 IMPLANT

## 2016-04-28 NOTE — Telephone Encounter (Signed)
Called to let pt know that Dr Whitney Muse wanted her to come in some she could talk to her about what I had called her about on Friday and her treatment options.  Her appt is 04/29/2016 at 1:20pm told pt to arrive at 1:00pm.  Pt verbalized understanding.

## 2016-04-28 NOTE — Interval H&P Note (Signed)
History and Physical Interval Note:  04/28/2016 8:04 AM  [name]  has presented today for surgery, with the diagnosis of hodgkin's lymphoma  The various methods of treatment have been discussed with the patient and family. After consideration of risks, benefits and other options for treatment, the patient has consented to  Procedure(s): INSERTION PORT-A-CATH (N/A) as a surgical intervention .  The patient's history has been reviewed, patient examined, no change in status, stable for surgery.  I have reviewed the patient's chart and labs.  Questions were answered to the patient's satisfaction.     Aviva Signs A

## 2016-04-28 NOTE — Transfer of Care (Signed)
Immediate Anesthesia Transfer of Care Note  Patient: [name]  Procedure(s) Performed: Procedure(s): INSERTION PORT-A-CATH (Left)  Patient Location: PACU  Anesthesia Type:General  Level of Consciousness: awake, oriented, patient cooperative and responds to stimulation  Airway & Oxygen Therapy: Patient Spontanous Breathing and Patient connected to face mask oxygen  Post-op Assessment: Report given to RN, Post -op Vital signs reviewed and stable and Patient moving all extremities  Post vital signs: Reviewed and stable  Last Vitals:  Vitals:   04/28/16 0815 04/28/16 0830  BP: 120/80 138/80  Resp: 13 (!) 0  Temp:      Last Pain:  Vitals:   04/28/16 0752  TempSrc: Oral      Patients Stated Pain Goal: 7 ([id number])  Complications: No apparent anesthesia complications

## 2016-04-28 NOTE — Anesthesia Preprocedure Evaluation (Signed)
Anesthesia Evaluation  Patient identified by MRN, date of birth, ID band Patient awake    Reviewed: Allergy & Precautions, H&P , NPO status , Patient's Chart, lab work & pertinent test results  Airway Mallampati: II   Neck ROM: full    Dental   Pulmonary asthma , sleep apnea ,    breath sounds clear to auscultation       Cardiovascular hypertension,  Rhythm:regular Rate:Normal     Neuro/Psych  Headaches,    GI/Hepatic   Endo/Other  Morbid obesity  Renal/GU      Musculoskeletal   Abdominal   Peds  Hematology   Anesthesia Other Findings   Reproductive/Obstetrics                             Anesthesia Physical Anesthesia Plan  ASA: II  Anesthesia Plan: General   Post-op Pain Management:    Induction: Intravenous  Airway Management Planned: LMA  Additional Equipment:   Intra-op Plan:   Post-operative Plan:   Informed Consent: I have reviewed the patients History and Physical, chart, labs and discussed the procedure including the risks, benefits and alternatives for the proposed anesthesia with the patient or authorized representative who has indicated his/her understanding and acceptance.     Plan Discussed with: CRNA, Anesthesiologist and Surgeon  Anesthesia Plan Comments:         Anesthesia Quick Evaluation

## 2016-04-28 NOTE — Op Note (Signed)
Patient:  [name]  DOB:  [birth year]  MRN:  [id number]   Preop Diagnosis:  Lymphoma, need for central venous access  Postop Diagnosis:  Same  Procedure:  Port-A-Cath insertion  Surgeon:  Aviva Signs, M.D.  Anes:  Gen.  Indications:  Patient is a 30 year old black female who was recently diagnosed with lymphoma and now presents for central venous access for chemotherapy. The risks and benefits of the procedure including bleeding, infection, and pneumothorax were fully explained to the patient, who gave informed consent.  Procedure note:  After general anesthesia was administered, the left upper chest was prepped and draped using the usual sterile technique with DuraPrep. Surgical site confirmation was performed. 1% Xylocaine was instilled into the surrounding region.  The patient was placed in Trendelenburg position. An incision was made below the left clavicle. A subcutaneous pocket was then formed. A needle is advanced into the left subclavian vein using the Seldinger technique without difficulty. A guidewire was then advanced into the right atrium under fluoroscopic guidance. An introducer and peel-away sheath were then placed over the guidewire. The catheter was then inserted through the peel-away sheath the peel-away sheath was removed. The catheter was then attached the port and the port placed in subcutaneous pocket. Adequate positioning was confirmed by fluoroscopy. Good backflow blood was noted in the port. A power port was inserted. The port was flushed with heparin flush. The subcutaneous layer was reapproximated using a 3-0 Vicryl interrupted suture. The skin was closed using a 4-0 Vicryl subcuticular suture. Dermabond was then applied.  All tape and needle counts were correct the end of the procedure. Patient was awakened and transferred to PACU in stable condition. Complications:  None  EBL:  Minimal  Specimen:  None

## 2016-04-28 NOTE — Anesthesia Postprocedure Evaluation (Signed)
Anesthesia Post Note  Patient: REBIE LAVOY  Procedure(s) Performed: Procedure(s) (LRB): INSERTION PORT-A-CATH (Left)  Patient location during evaluation: PACU Anesthesia Type: General Level of consciousness: awake and alert, patient cooperative and responds to stimulation Pain management: pain level controlled Vital Signs Assessment: post-procedure vital signs reviewed and stable Respiratory status: spontaneous breathing, respiratory function stable and non-rebreather facemask Cardiovascular status: blood pressure returned to baseline and stable Anesthetic complications: no    Last Vitals:  Vitals:   04/28/16 0815 04/28/16 0830  BP: 120/80 138/80  Resp: 13 (!) 0  Temp:      Last Pain:  Vitals:   04/28/16 0752  TempSrc: Oral                 Caniyah Murley

## 2016-04-28 NOTE — Discharge Instructions (Signed)
Implanted Port Insertion, Care After Refer to this sheet in the next few weeks. These instructions provide you with information on caring for yourself after your procedure. Your health care provider may also give you more specific instructions. Your treatment has been planned according to current medical practices, but problems sometimes occur. Call your health care provider if you have any problems or questions after your procedure. WHAT TO EXPECT AFTER THE PROCEDURE After your procedure, it is typical to have the following:   Discomfort at the port insertion site. Ice packs to the area will help.  Bruising on the skin over the port. This will subside in 3-4 days. HOME CARE INSTRUCTIONS  After your port is placed, you will get a manufacturer's information card. The card has information about your port. Keep this card with you at all times.   Know what kind of port you have. There are many types of ports available.   Wear a medical alert bracelet in case of an emergency. This can help alert health care workers that you have a port.   The port can stay in for as long as your health care provider believes it is necessary.   A home health care nurse may give medicines and take care of the port.   You or a family member can get special training and directions for giving medicine and taking care of the port at home.  SEEK MEDICAL CARE IF:   Your port does not flush or you are unable to get a blood return.   You have a fever or chills. SEEK IMMEDIATE MEDICAL CARE IF:  You have new fluid or pus coming from your incision.   You notice a bad smell coming from your incision site.   You have swelling, pain, or more redness at the incision or port site.   You have chest pain or shortness of breath.   This information is not intended to replace advice given to you by your health care provider. Make sure you discuss any questions you have with your health care provider.   Document  Released: 05/25/2013 Document Revised: 08/09/2013 Document Reviewed: 05/25/2013 Elsevier Interactive Patient Education 2016 Portal Insertion An implanted port is a central line that has a round shape and is placed under the skin. It is used as a long-term IV access for:   Medicines, such as chemotherapy.   Fluids.   Liquid nutrition, such as total parenteral nutrition (TPN).   Blood samples.  LET [facility] CARE PROVIDER KNOW ABOUT:  Allergies to food or medicine.   Medicines taken, including vitamins, herbs, eye drops, creams, and over-the-counter medicines.   Any allergies to heparin.  Use of steroids (by mouth or creams).   Previous problems with anesthetics or numbing medicines.   History of bleeding problems or blood clots.   Previous surgery.   Other health problems, including diabetes and kidney problems.   Possibility of pregnancy, if this applies. RISKS AND COMPLICATIONS Generally, this is a safe procedure. However, as with any procedure, problems can occur. Possible problems include:  Damage to the blood vessel, bruising, or bleeding at the puncture site.   Infection.  Blood clot in the vessel that the port is in.  Breakdown of the skin over your port.  Very rarely a person may develop a condition called a pneumothorax, a collection of air in the chest that may cause one of the lungs to collapse. The placement of these catheters with the appropriate imaging  guidance significantly decreases the risk of a pneumothorax.  BEFORE THE PROCEDURE   Your health care provider may want you to have blood tests. These tests can help tell how well your kidneys and liver are working. They can also show how well your blood clots.   If you take blood thinners (anticoagulant medicines), ask your health care provider when you should stop taking them.   Make arrangements for someone to drive you home. This is necessary if you have been  sedated for your procedure.  PROCEDURE  Port insertion usually takes about 30-45 minutes.   An IV needle will be inserted in your arm. Medicine for pain and medicine to help relax you (sedative) will flow directly into your body through this needle.   You will lie on an exam table, and you will be connected to monitors to keep track of your heart rate, blood pressure, and breathing throughout the procedure.  An oxygen monitoring device may be attached to your finger. Oxygen will be given.   Everything will be kept as germ free (sterile) as possible during the procedure. The skin near the point of the incision will be cleansed with antiseptic, and the area will be draped with sterile towels. The skin and deeper tissues over the port area will be made numb with a local anesthetic.  Two small cuts (incisions) will be made in the skin to insert the port. One will be made in the neck to obtain access to the vein where the catheter will lie.   Because the port reservoir will be placed under the skin, a small skin incision will be made in the upper chest, and a small pocket for the port will be made under the skin. The catheter that will be connected to the port tunnels to a large central vein in the chest. A small, raised area will remain on your body at the site of the reservoir when the procedure is complete.  The port placement will be done under imaging guidance to ensure the proper placement.  The reservoir has a silicone covering that can be punctured with a special needle.   The port will be flushed with normal saline, and blood will be drawn to make sure it is working properly.  There will be nothing remaining outside the skin when the procedure is finished.   Incisions will be held together by stitches, surgical glue, or a special tape. AFTER THE PROCEDURE  You will stay in a recovery area until the anesthesia has worn off. Your blood pressure and pulse will be checked.  A  final chest X-ray will be taken to check the placement of the port and to ensure that there is no injury to your lung.   This information is not intended to replace advice given to you by your health care provider. Make sure you discuss any questions you have with your health care provider.   Document Released: 05/25/2013 Document Revised: 08/25/2014 Document Reviewed: 05/25/2013 Elsevier Interactive Patient Education 2016 Elsevier Inc[employer]   PATIENT INSTRUCTIONS POST-ANESTHESIA  IMMEDIATELY FOLLOWING SURGERY:  Do not drive or operate machinery for the first twenty four hours after surgery.  Do not make any important decisions for twenty four hours after surgery or while taking narcotic pain medications or sedatives.  If you develop intractable nausea and vomiting or a severe headache please notify your doctor immediately.  FOLLOW-UP:  Please make an appointment with your surgeon as instructed. You do not need to follow up with anesthesia  unless specifically instructed to do so.  WOUND CARE INSTRUCTIONS (if applicable):  Keep a dry clean dressing on the anesthesia/puncture wound site if there is drainage.  Once the wound has quit draining you may leave it open to air.  Generally you should leave the bandage intact for twenty four hours unless there is drainage.  If the epidural site drains for more than 36-48 hours please call the anesthesia department.  QUESTIONS?:  Please feel free to call your physician or the hospital operator if you have any questions, and they will be happy to assist you.

## 2016-04-28 NOTE — Anesthesia Procedure Notes (Addendum)
Procedure Name: Intubation Date/Time: 04/28/2016 8:43 AM Performed by: Gershon Mussel, Gareld Obrecht Pre-anesthesia Checklist: Patient identified, Patient being monitored, Timeout performed, Emergency Drugs available and Suction available Patient Re-evaluated:Patient Re-evaluated prior to inductionOxygen Delivery Method: Circle System Utilized Preoxygenation: Pre-oxygenation with 100% oxygen Intubation Type: IV induction Ventilation: Mask ventilation without difficulty LMA Size: 4.0 Number of attempts: 1 Placement Confirmation: breath sounds checked- equal and bilateral and positive ETCO2 Tube secured with: Tape Dental Injury: Teeth and Oropharynx as per pre-operative assessment

## 2016-04-29 ENCOUNTER — Encounter (HOSPITAL_BASED_OUTPATIENT_CLINIC_OR_DEPARTMENT_OTHER): Payer: 59 | Admitting: Hematology & Oncology

## 2016-04-29 ENCOUNTER — Other Ambulatory Visit (HOSPITAL_COMMUNITY): Payer: Self-pay | Admitting: Oncology

## 2016-04-29 ENCOUNTER — Encounter (HOSPITAL_COMMUNITY): Payer: Self-pay | Admitting: Hematology & Oncology

## 2016-04-29 VITALS — BP 154/109 | HR 95 | Temp 98.4°F | Resp 16 | Wt 269.4 lb

## 2016-04-29 DIAGNOSIS — R530 Neoplastic (malignant) related fatigue: Secondary | ICD-10-CM | POA: Diagnosis not present

## 2016-04-29 DIAGNOSIS — C8105 Nodular lymphocyte predominant Hodgkin lymphoma, lymph nodes of inguinal region and lower limb: Secondary | ICD-10-CM | POA: Diagnosis not present

## 2016-04-29 MED ORDER — ONDANSETRON HCL 8 MG PO TABS: 8.0000 mg | ORAL_TABLET | Freq: Two times a day (BID) | ORAL | 1 refills | Status: DC | PRN

## 2016-04-29 MED ORDER — PREDNISONE 20 MG PO TABS: 40.0000 mg/m2 | ORAL_TABLET | Freq: Every day | ORAL | 6 refills | Status: DC

## 2016-04-29 MED ORDER — LIDOCAINE-PRILOCAINE 2.5-2.5 % EX CREA: TOPICAL_CREAM | CUTANEOUS | 3 refills | Status: DC

## 2016-04-29 MED ORDER — PROCHLORPERAZINE MALEATE 10 MG PO TABS: 10.0000 mg | ORAL_TABLET | Freq: Four times a day (QID) | ORAL | 6 refills | Status: DC | PRN

## 2016-04-29 MED ORDER — ALLOPURINOL 300 MG PO TABS: 300.0000 mg | ORAL_TABLET | Freq: Every day | ORAL | 3 refills | Status: DC

## 2016-04-29 NOTE — Progress Notes (Signed)
[place] NOTE  Patient Care Team: Eloise Levels, NP as PCP - General (Nurse Practitioner)  CHIEF COMPLAINTS/PURPOSE OF CONSULTATION:     Hodgkin lymphoma, nodular lymphocyte predominance ([place])   04/07/2016 Procedure    Excisional lymph node biopsy of right thigh      04/11/2016 Pathology Results    Nodular lymphocyte predominant hodgkin Lymphoma      04/23/2016 Miscellaneous    Normal ESR, LDH      04/25/2016 PET scan    1. Hypermetabolic enlarged bilateral axillary, bilateral external iliac and bilateral inguinal lymph nodes consistent with lymphoma. 2. Hypermetabolic foci of skin thickening in the bilateral axilla and bilateral lower ventral chest wall, nonspecific, cannot exclude cutaneous lymphoma. 3. Hypermetabolism throughout Waldeyer's ring with associated mucosal thickening on the CT images, which could be inflammatory or due to lymphoma. 4. No splenic enlargement or hypermetabolism. 5. Diffuse hypermetabolism throughout the axial skeleton without discrete bone lesions on the CT images, worrisome for diffuse osseous involvement by lymphoma.       HISTORY OF PRESENTING ILLNESS:  [name] 30 y.o. 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 I have discussed her case with [facility] and based upon guidelines, reading and [facility] consultation RCHOP is being recommended. The patient notes she has been reading and read about RCHOP as well.   Patient says sometimes she feels a little angry about her diagnosis. She has joined [place] support group to help her cope with her disease.  She does not need refills on any medications. She notes she is ready to start therapy. Here today with her husband and mother in law.   MEDICAL HISTORY:  Past Medical History:  Diagnosis Date  . Asthma    had since childhood; has not had to use inhaler or nebulizer for 8-9 years. Been doing well.  Marland Kitchen  Headache(784.0)    usually controlled c Tylenol when not pregnant, but more painful headaches  during preg. due to hypertension.  . Hidradenitis suppurativa   . Hodgkin lymphoma, nodular lymphocyte predominance ([place]) 04/15/2016  . Hypertension December 2011   Been on [place] since Dec. 2011  . Sleep apnea     SURGICAL HISTORY: Past Surgical History:  Procedure Laterality Date  . ADENOIDECTOMY     age 18  . BREAST RECONSTRUCTION  December 2001   breast reduction at age 53  . MASS EXCISION Right 04/07/2016   Procedure: EXCISION/BIOPSY SOFT TISSUE MASS RIGHT THIGH;  Surgeon: Aviva Signs, MD;  Location: [facility];  Service: General;  Laterality: Right;  . port a cath placed      SOCIAL HISTORY: Social History   Social History  . Marital status: Married    Spouse name: N/A  . Number of children: N/A  . Years of education: N/A   Occupational History  . Not on file.   Social History Main Topics  . Smoking status: Never Smoker  . Smokeless tobacco: Never Used  . Alcohol use No  . Drug use: No  . Sexual activity: Yes    Birth control/ protection: IUD   Other Topics Concern  . Not on file   Social History Narrative  . No narrative on file  She has two children. One is 4 and one is 13. She also has a dog. She like to dance.  She has been married 8 years, but has been with her husband a total of 18 years.  Patient works  from home for [employer].  FAMILY HISTORY: Family History  Problem Relation Age of Onset  . Hypertension Mother   . Cancer Mother   . Hypertension Father   . Heart disease Maternal Grandmother     ALLERGIES:  has No Known Allergies.  MEDICATIONS:  Current Outpatient Prescriptions  Medication Sig Dispense Refill  . ALPRAZolam (XANAX) 0.5 MG tablet Take 1 tablet (0.5 mg total) by mouth 3 (three) times daily as needed for anxiety. 15 tablet 0  . HYDROcodone-acetaminophen (NORCO) 5-325 MG tablet Take 1-2 tablets by mouth every 4 (four) hours as  needed for moderate pain. 30 tablet 0  . ibuprofen (ADVIL,MOTRIN) 200 MG tablet Take 400-600 mg by mouth every 6 (six) hours as needed for moderate pain.    Marland Kitchen levonorgestrel (MIRENA) 20 MCG/24HR IUD 1 each by Intrauterine route once.    Marland Kitchen UNABLE TO FIND Med Name: Birth control pill    . Vitamin D, Ergocalciferol, (DRISDOL) 50000 units CAPS capsule Take 50,000 Units by mouth every 7 (seven) days.     No current facility-administered medications for this visit.     Review of Systems  Constitutional: Positive for malaise/fatigue.       Fatigued for about two years  HENT: Negative.   Eyes: Negative.   Respiratory: Negative.   Cardiovascular: Negative.   Gastrointestinal: Negative.   Genitourinary: Negative.   Musculoskeletal: Negative.   Skin: Negative.        Bump on abdomen   Neurological: Negative.   Endo/Heme/Allergies: Negative.   Psychiatric/Behavioral: Negative.   All other systems reviewed and are negative.  14 point ROS was done and is otherwise as detailed above or in HPI   PHYSICAL EXAMINATION: ECOG PERFORMANCE STATUS: 1 - Symptomatic but completely ambulatory  Vitals:   04/29/16 1319  BP: (!) 154/109  Pulse: 95  Resp: 16  Temp: 98.4 F (36.9 C)   Filed Weights   04/29/16 1319  Weight: 269 lb 6.4 oz (122.2 kg)     Physical Exam  Constitutional: She is oriented to person, place, and time and well-developed, well-nourished, and in no distress.  Obese  HENT:  Head: Normocephalic and atraumatic.  Nose: Nose normal.  Mouth/Throat: Oropharynx is clear and moist. No oropharyngeal exudate.  Eyes: Conjunctivae and EOM are normal. Pupils are equal, round, and reactive to light. Right eye exhibits no discharge. Left eye exhibits no discharge. No scleral icterus.  Neck: Normal range of motion. Neck supple. No tracheal deviation present. No thyromegaly present.  Cardiovascular: Normal rate, regular rhythm and normal heart sounds.  Exam reveals no gallop and no  friction rub.   No murmur heard. Pulmonary/Chest: Effort normal and breath sounds normal. She has no wheezes. She has no rales.  Abdominal: Soft. Bowel sounds are normal. She exhibits no distension and no mass. There is no tenderness. There is no rebound and no guarding.  Musculoskeletal: Normal range of motion. She exhibits no edema.  R thigh with well healing incision site  Lymphadenopathy:    She has no cervical adenopathy.  Neurological: She is alert and oriented to person, place, and time. She has normal reflexes. No cranial nerve deficit. Gait normal. Coordination normal.  Skin: Skin is warm and dry. No rash noted.  Psychiatric: Mood, memory, affect and judgment normal.  Nursing note and vitals reviewed.   LABORATORY DATA:  I have reviewed the data as listed Lab Results  Component Value Date   WBC 10.7 (H) 04/23/2016   HGB 13.5 04/23/2016  HCT 41.6 04/23/2016   MCV 82.5 04/23/2016   PLT 488 (H) 04/23/2016   CMP     Component Value Date/Time   NA 138 04/23/2016 1437   K 3.7 04/23/2016 1437   CL 102 04/23/2016 1437   CO2 28 04/23/2016 1437   GLUCOSE 82 04/23/2016 1437   BUN 10 04/23/2016 1437   CREATININE 0.78 04/23/2016 1437   CREATININE 0.78 02/16/2013 1721   CALCIUM 9.6 04/23/2016 1437   PROT 7.8 04/23/2016 1437   ALBUMIN 4.0 04/23/2016 1437   AST 13 (L) 04/23/2016 1437   ALT 14 04/23/2016 1437   ALKPHOS 44 04/23/2016 1437   BILITOT 0.3 04/23/2016 1437   GFRNONAA >60 04/23/2016 1437   GFRNONAA >89 02/16/2013 1721   GFRAA >60 04/23/2016 1437   GFRAA >89 02/16/2013 1721    PATHOLOGY:    RADIOGRAPHIC STUDIES: I have personally reviewed the radiological images as listed and agreed with the findings in the report. Dg Chest Port 1 View  Result Date: 04/28/2016 CLINICAL DATA:  Post left for placement EXAM: PORTABLE CHEST 1 VIEW COMPARISON:  04/04/2016 FINDINGS: Left subclavian Port-A-Cath has been placed with the tip at the cavoatrial junction. No  pneumothorax. Low lung volumes. Mild cardiomegaly and vascular congestion. No confluent opacities or effusions. IMPRESSION: Left Port-A-Cath tip at the cavoatrial junction.  No pneumothorax. Electronically Signed   By: Rolm Baptise M.D.   On: 04/28/2016 10:07   Dg C-arm 1-60 Min-no Report  Result Date: 04/28/2016 CLINICAL DATA: port a cath insertion C-ARM 1-60 MINUTES Fluoroscopy was utilized by the requesting physician.  No radiographic interpretation.    ASSESSMENT & PLAN:  Hodgkin Lymphoma Nodular Lymphocyte Predominant Disease, Stage IV No B symptoms Cancer related Fatigue Normal LDH Normal ESR  The patient is very well educated and has done a lot of research on her own. I have recommended RCHOP based upon the presence of abdominal adenopathy and stage of disease, I have discussed her case with Dr. Lonia Blood at [facility] who concurs. The patient notes that she is prepared for RCHOP therapy.   We discussed the individual drugs today and side effects including neuropathy, low blood counts, cardiac toxicity, risk of infection, etc. She understands she will have chemotherapy teaching. We have agreed that benefits outweigh risks.   Will plan on starting treatment next week.  Follow-up with [place] one week post for nadir check and to assess tolerance.   FROM UP TO DATE: Combination chemotherapy is the main treatment for patients with stage III/IV NLPHL. When treated with alkylator-based regimens, adults with NLPHL respond to treatment in a similar fashion to patients with cHL and identical clinical stage. There are limited data regarding the best chemotherapy regimen for this population, and clinical practice varies. For most patients with advanced disease, we suggest the use of R-CHOP (rituximab, cyclophosphamide, doxorubicin, vincristine, and prednisone) (table 3) rather than ABVD (Grade 2C). This preference is primarily based upon our clinical experience, the desire to use rituximab in a CD20 positive  tumor, and limited data that suggest that ABVD (doxorubicin, bleomycin, vinblastine, and dacarbazine) is not as effective as other alkylator-based regimens. (See 'Stage III/IV disease' above.) ?Approximately 15 to 30 percent of patients with NLPHL relapse. Relapsed disease tends to be responsive to further chemotherapy and/or involved-node or involved-site RT. Patients with relapsed disease should be restaged and treated accordingly.   How I treat nodular lymphocyte predominant Hodgkin lymphoma Ranjana H. Advani and Richard T. [place]  Blood 2013 122:[id number]; doi: ThisMLS.nl   At  [facility], patients with advanced disease are treated with curative intent. Given the universal expression of CD20, our usual first-line approach incorporates rituximab with regimens used for cHL. For patients who present with B symptoms or abdominal involvement, scenarios in which there may be occult transformation, we favor using RCHOP for 6 cycles NLPHL is rare, with few prospective trials to guide therapy. RT continues to play an integral role in stage I to II disease, as does chemotherapy in advanced stages. The optimal chemotherapy is debatable, and some data suggest that including an alkylating agent may improve efficacy. Although the results with single-agent rituximab in the front-line setting are inferior to conventional therapy, rituximab is a reasonable choice for patients with relapsed disease. The inherent risk of developing an aggressive B-cell lymphoma underscores the importance of long-term follow-up, as well as rebiopsy at relapse. Randomized trials are likely not feasible because of the rarity of NLPHL; however, strategies to consider testing include "watch and wait" for selected patients, such as young children with limited asymptomatic disease or individuals with totally excised solitary nodal involvement, and combinations of targeted therapy, such as rituximab or other  anti-CD20 monoclonal antibodies with conventional therapy. The ultimate goal must be maintenance of excellent PFS and minimization of risk for late effects  All questions were answered. The patient knows to call the clinic with any problems, questions or concerns.  This document serves as a record of services personally performed by Ancil Linsey, MD. It was created on her behalf by Elmyra Ricks, a trained medical scribe. The creation of this record is based on the scribe's personal observations and the provider's statements to them. This document has been checked and approved by the attending provider.  I have reviewed the above documentation for accuracy and completeness, and I agree with the above.  This note was electronically signed.    Molli Hazard, MD  04/29/2016 1:57 PM

## 2016-04-29 NOTE — Patient Instructions (Addendum)
[place] at [facility] Discharge Instructions  RECOMMENDATIONS MADE BY THE CONSULTANT AND ANY TEST RESULTS WILL BE SENT TO YOUR REFERRING PHYSICIAN.  You saw Dr. Whitney [name] today. [name] is the nurse navigator. Chemo teaching by [name]. Plan to start chemo on Monday. Follow up with Dr. Whitney [name] on 9/25 with lab work.  Thank you for choosing [place] at [facility] to provide your oncology and hematology care.  To afford each patient quality time with our provider, please arrive at least 15 minutes before your scheduled appointment time.   Beginning January 23rd 2017 lab work for the [facility] will be done in the  Main lab at [facility] on 1st floor. If you have a lab appointment with the [facility] please come in thru the  Main Entrance and check in at the main information desk  You need to re-schedule your appointment should you arrive 10 or more minutes late.  We strive to give you quality time with our providers, and arriving late affects you and other patients whose appointments are after yours.  Also, if you no show three or more times for appointments you may be dismissed from the clinic at the providers discretion.     Again, thank you for choosing [facility].  Our hope is that these requests will decrease the amount of time that you wait before being seen by our physicians.       _____________________________________________________________  Should you have questions after your visit to [facility], please contact our office at [phone] between the hours of 8:30 a.m. and 4:30 p.m.  Voicemails left after 4:30 p.m. will not be returned until the following business day.  For prescription refill requests, have your pharmacy contact our office.         Resources For Cancer Patients and their Caregivers ? American Cancer Society: Can assist with transportation, wigs, general needs, runs Look  Good Feel Better.        (279)464-2688 ? Cancer Care: Provides financial assistance, online support groups, medication/co-pay assistance.  1-800-813-HOPE 820-355-7053) ? [facility] Assists [place] cancer patients and their families through emotional , educational and financial support.  (718)778-2355 ? [place] DSS Where to apply for food stamps, Medicaid and utility assistance. 251-695-1239 ? [facility]: Transportation to medical appointments. (873)328-3428 ? Social Security Administration: May apply for disability if have a Stage IV cancer. (916) 119-7209 (508)111-7895 ? [employer], Disability and Transit Services: Assists with nutrition, care and transit needs. [facility] Support Programs: @10RELATIVEDAYS @ > Cancer Support Group  2nd Tuesday of the month 1pm-2pm, Journey Room  > Creative Journey  3rd Tuesday of the month 1130am-1pm, Journey Room  > Look Good Feel Better  1st Wednesday of the month 10am-12 noon, Journey Room (Call [facility] to register [phone])

## 2016-04-29 NOTE — Patient Instructions (Signed)
[place]   CHEMOTHERAPY INSTRUCTIONS   Premeds: Benadryl- to help prevent any reaction to chemotherapy. Tylenol- to help prevent reaction/fever to chemotherapy.  Aloxi - high powered nausea/vomiting prevention medication used for chemotherapy patients. . Dexamethasone - steroid - given to reduce the risk of you having an allergic type reaction to the chemotherapy. Dex can cause you to feel energized, nervous/anxious/jittery, make you have trouble sleeping, and/or make you feel hot/flushed in the face/neck and/or look pink/red in the face/neck. These side effects will pass as the Dex wears off. (takes 20 minutes to infuse)    EDUCATIONAL MATERIALS GIVEN AND REVIEWED: Chemotherapy and you book, education on RCHOP    SELF CARE ACTIVITIES WHILE ON CHEMOTHERAPY:  Increase your fluid intake 48 hours prior to treatment and drink at least 2 quarts (64 oz of water/decaff beverages) per day after treatment. No alcohol intake. No aspirin or other medications unless approved by your oncologist. Eat foods that are light and easy to digest. Eat foods at cold or room temperature (as long as you aren't on the drug Oxaliplatin). No fried, fatty, or spicy foods immediately before or after treatment. Have teeth cleaned professionally before starting treatment. Keep dentures and partial plates clean. Use soft toothbrush and do not use mouthwashes that contain alcohol. Biotene is a good mouthwash that is available at most pharmacies or may be ordered by calling [phone]. Use warm salt water gargles (1 teaspoon salt per 1 quart warm water) before and after meals and at bedtime. Or you may rinse with 2 tablespoons of three-percent hydrogen peroxide mixed in eight ounces of water. Always use sunscreen that has not expired and with SPF (Sun Protection Factor) of 50 or higher. Wear hats to protect your head from the sun. Remember to use sunscreen on your hands, ears, face, & feet. Use your nausea  medication as directed to prevent nausea. Use your stool softener or laxative as directed to prevent constipation. Use your anti-diarrheal medication as directed to stop diarrhea.   Please wash your hands for at least 30 seconds using warm soapy water. Handwashing is the #1 way to prevent the spread of germs. Stay away from sick people or people who are getting over a cold. If you develop respiratory systems such as green/yellow mucus production or productive cough or persistent cough let us know and we will see if you need an antibiotic. It is a good idea to keep a pair of gloves on when going into grocery stores/[employer] to decrease your risk of coming into contact with germs on the carts, etc. Carry alcohol hand gel with you at all times and use it frequently if out in public. All foods need to be cooked thoroughly. No raw foods. No medium or undercooked meats, eggs. If your food is cooked medium well, it does not need to be hot pink or saturated with bloody liquid at all. Vegetables and fruits need to be washed/rinsed under the faucet with a dish detergent before being consumed. You can eat raw fruits and vegetables unless we tell you otherwise but it would be best if you cooked them or bought frozen. Do not eat off of salad bars or hot bars unless you really trust the cleanliness of the restaurant. If you need dental work, please let Dr. Whitney Muse know before you go for your appointment so that we can coordinate the best possible time for you in regards to your chemo regimen. You need to also let your dentist know that  you are actively taking chemo. We may need to do labs prior to your dental appointment. We also want your bowels moving at least every other day. If this is not happening, we need to know so that we can get you on a bowel regimen to help you go. If you are going to have sex, a condom must be used to protect the person that isn't taking chemotherapy. Chemo can decrease your libido (sex  drive).     MEDICATIONS:   Allopurinol (ZYLOPRIM) 300 MG tablet. Take 1 tablet (300 mg total) by mouth daily.    Prednisone 20 mg. Take 4.5 tablets (90 mg total) by mouth daily. Take on days 1-5 of chemotherapy.                                                                                                                                                              Zofran/Ondansetron 8mg  tablet. Take 1 tablet every 8 hours as needed for nausea/vomiting. (#1 nausea med to take, this can constipate)  Compazine/Prochlorperazine 10mg  tablet. Take 1 tablet every 6 hours as needed for nausea/vomiting. (#2 nausea med to take, this can make you sleepy)   EMLA cream. Apply a quarter size amount to port site 1 hour prior to chemo. Do not rub in. Cover with plastic wrap.   Over-the-Counter Meds:  Miralax 1 capful in 8 oz of fluid daily. May increase to two times a day if needed. This is a stool softener. If this doesn't work proceed you can add:  Senokot S  - start with 1 tablet two times a day and increase to 4 tablets two times a day if needed. (total of 8 tablets in a 24 hour period). This is a stimulant laxative.   Call us if this does not help your bowels move.   Imodium 2mg  capsule. Take 2 capsules after the 1st loose stool and then 1 capsule every 2 hours until you go a total of 12 hours without having a loose stool. Call the [facility] if loose stools continue. If diarrhea occurs @ bedtime, take 2 capsules @ bedtime. Then take 2 capsules every 4 hours until morning. Call [facility].      (Please refer to/review other teaching materials that have been provided to you in this blue folder - What to Know During Chemo, What to know After Chemo, Dr. Donald Pore Advice, Constipation Sheet, Diarrhea Sheet, Nausea Sheet, Self Care Activities While on Chemo)     SYMPTOMS TO REPORT AS SOON AS POSSIBLE AFTER TREATMENT:  FEVER GREATER THAN 100.5 F  CHILLS WITH OR WITHOUT  FEVER  NAUSEA AND VOMITING THAT IS NOT CONTROLLED WITH YOUR NAUSEA MEDICATION  UNUSUAL SHORTNESS OF BREATH  UNUSUAL BRUISING OR BLEEDING  TENDERNESS IN MOUTH AND THROAT WITH OR WITHOUT  PRESENCE OF ULCERS  URINARY PROBLEMS  BOWEL PROBLEMS  UNUSUAL RASH    Wear comfortable clothing and clothing appropriate for easy access to any Portacath or PICC line. Let us know if there is anything that we can do to make your therapy better!      I have been informed and understand all of the instructions given to me and have received a copy. I have been instructed to call the clinic [phone])  or my family physician as soon as possible for continued medical care, if indicated. I do not have any more questions at this time but understand that I may call the [facility] at ([phone] during office hours should I have questions or need assistance in obtaining follow-up care.

## 2016-04-29 NOTE — Progress Notes (Signed)
START OFF PATHWAY REGIMEN - Lymphoma and CLL  Off Pathway: R-CHOP q21 Days + Pegfilgrastim  [id number]:R-CHOP q21 Days + Pegfilgrastim:   A cycle is every 21 days:     Rituximab (Rituxan(R)) 375 mg/m2 in _____ mL NS IV day 1 only.  Initiate first dose at a rate of 50 mg/hr.  In the absence of infusion toxicity, increase infusion rate by 50 mg/hr increments every 30 minutes, to a maximum of 400 mg/hr.  For  follicular and DLBC lymphoma patients see "Rapid infusion protocol" link for more information about accelerating the infusion time of rituximab Dose Mod: None     Cyclophosphamide (Cytoxan(R)) 750 mg/m2 in 250 mL NS IV over 30 minutes day 1 only Dose Mod: None     Doxorubicin (Adriamycin(R)) 50 mg/m2 IV push day 1 only Dose Mod: None     Vincristine (Oncovin(R)) 1.4 mg/m2; MAX 2 mg in 50 mL normal saline IV over 15 minutes (MAX DOSE 2 mg) Dose Mod: None     Prednisone 100 mg orally daily days 1,2,3,4,5; RX x 5 days start D1 Dose Mod: None     Pegfilgrastim (Neulasta(R)) 6 mg flat dose SQ recommended for patients > 49 yrs old due to data showing a >20% risk of febrile neutropenia Dose Mod: None Additional Orders: Hepatitis B&C testing recommended prior to rituximab use on all patients. Final concentration of rituximab must be between 1 and 4 mg/mL.  **Always confirm dose/schedule in your pharmacy ordering system**    Patient Characteristics: Lymphocyte Predominant Hodgkin Lymphoma, First Line, Stage III / IV Disease Type: Lymphocyte Predominant Hodgkin Lymphoma Disease Type: Not Applicable Line of therapy: First Line [place] Stage: IVAE  Intent of Therapy: Curative Intent, Discussed with Patient

## 2016-04-30 ENCOUNTER — Encounter (HOSPITAL_COMMUNITY): Payer: 59

## 2016-04-30 ENCOUNTER — Encounter (HOSPITAL_COMMUNITY): Payer: Self-pay | Admitting: Emergency Medicine

## 2016-04-30 ENCOUNTER — Telehealth (HOSPITAL_COMMUNITY): Payer: Self-pay | Admitting: *Deleted

## 2016-04-30 DIAGNOSIS — C8100 Nodular lymphocyte predominant Hodgkin lymphoma, unspecified site: Secondary | ICD-10-CM

## 2016-04-30 DIAGNOSIS — C8105 Nodular lymphocyte predominant Hodgkin lymphoma, lymph nodes of inguinal region and lower limb: Secondary | ICD-10-CM

## 2016-04-30 NOTE — Progress Notes (Unsigned)
chemotherapy consent signed today Notified pt that her MUGA was normal.

## 2016-04-30 NOTE — Telephone Encounter (Signed)
-----   Message from [facility], PA-C sent at 04/29/2016  5:24 PM EDT ----- Good/normal.  Added to oncology history.

## 2016-04-30 NOTE — Telephone Encounter (Signed)
Pt aware of results of MUGA

## 2016-05-01 ENCOUNTER — Other Ambulatory Visit (HOSPITAL_COMMUNITY): Payer: Self-pay | Admitting: Pharmacist

## 2016-05-01 ENCOUNTER — Encounter (HOSPITAL_COMMUNITY): Payer: Self-pay | Admitting: General Surgery

## 2016-05-02 ENCOUNTER — Encounter (HOSPITAL_COMMUNITY): Payer: 59

## 2016-05-02 ENCOUNTER — Ambulatory Visit (HOSPITAL_COMMUNITY)
Admission: RE | Admit: 2016-05-02 | Discharge: 2016-05-02 | Disposition: A | Discharge status: Home / Self Care | Payer: 59 | Source: Ambulatory Visit | Attending: Hematology & Oncology | Admitting: Hematology & Oncology

## 2016-05-02 DIAGNOSIS — C8105 Nodular lymphocyte predominant Hodgkin lymphoma, lymph nodes of inguinal region and lower limb: Secondary | ICD-10-CM

## 2016-05-02 DIAGNOSIS — C8100 Nodular lymphocyte predominant Hodgkin lymphoma, unspecified site: Secondary | ICD-10-CM

## 2016-05-02 DIAGNOSIS — Z5111 Encounter for antineoplastic chemotherapy: Secondary | ICD-10-CM | POA: Insufficient documentation

## 2016-05-02 LAB — CBC WITH DIFFERENTIAL/PLATELET
BASOS PCT: 0 %
Basophils Absolute: 0 10*3/uL (ref 0.0–0.1)
EOS PCT: 8 %
Eosinophils Absolute: 0.8 10*3/uL — ABNORMAL HIGH (ref 0.0–0.7)
HCT: 40.7 % (ref 36.0–46.0)
HEMOGLOBIN: 12.9 g/dL (ref 12.0–15.0)
LYMPHS PCT: 33 %
Lymphs Abs: 3.1 10*3/uL (ref 0.7–4.0)
MCH: 26.5 pg (ref 26.0–34.0)
MCHC: 31.7 g/dL (ref 30.0–36.0)
MCV: 83.7 fL (ref 78.0–100.0)
Monocytes Absolute: 1.2 10*3/uL — ABNORMAL HIGH (ref 0.1–1.0)
Monocytes Relative: 13 %
NEUTROS ABS: 4.4 10*3/uL (ref 1.7–7.7)
NEUTROS PCT: 46 %
Platelets: 351 10*3/uL (ref 150–400)
RBC: 4.86 MIL/uL (ref 3.87–5.11)
RDW: 13.6 % (ref 11.5–15.5)
WBC: 9.5 10*3/uL (ref 4.0–10.5)

## 2016-05-02 LAB — COMPREHENSIVE METABOLIC PANEL
ALK PHOS: 44 U/L (ref 38–126)
ALT: 14 U/L (ref 14–54)
ANION GAP: 7 (ref 5–15)
AST: 16 U/L (ref 15–41)
Albumin: 3.9 g/dL (ref 3.5–5.0)
BILIRUBIN TOTAL: 0.2 mg/dL — AB (ref 0.3–1.2)
BUN: 10 mg/dL (ref 6–20)
CO2: 28 mmol/L (ref 22–32)
Calcium: 9.2 mg/dL (ref 8.9–10.3)
Chloride: 101 mmol/L (ref 101–111)
Creatinine, Ser: 0.7 mg/dL (ref 0.44–1.00)
Glucose, Bld: 72 mg/dL (ref 65–99)
Potassium: 3.5 mmol/L (ref 3.5–5.1)
Sodium: 136 mmol/L (ref 135–145)
Total Protein: 7.5 g/dL (ref 6.5–8.1)

## 2016-05-02 LAB — PULMONARY FUNCTION TEST
DL/VA % pred: 134 %
DL/VA: 6.21 ml/min/mmHg/L
DLCO UNC: 17.17 ml/min/mmHg
DLCO cor % pred: 75 %
DLCO cor: 17.12 ml/min/mmHg
DLCO unc % pred: 76 %
FEF 25-75 Post: 2.94 L/sec
FEF 25-75 Pre: 2.54 L/sec
FEF2575-%Change-Post: 15 %
FEF2575-%Pred-Post: 96 %
FEF2575-%Pred-Pre: 83 %
FEV1-%CHANGE-POST: 0 %
FEV1-%PRED-POST: 87 %
FEV1-%PRED-PRE: 88 %
FEV1-PRE: 2.28 L
FEV1-Post: 2.27 L
FEV1FVC-%Change-Post: 2 %
FEV1FVC-%Pred-Pre: 108 %
FEV6-%Change-Post: -3 %
FEV6-%PRED-POST: 79 %
FEV6-%PRED-PRE: 82 %
FEV6-POST: 2.39 L
FEV6-Pre: 2.48 L
FEV6FVC-%PRED-POST: 101 %
FEV6FVC-%PRED-PRE: 101 %
FVC-%CHANGE-POST: -3 %
FVC-%PRED-PRE: 81 %
FVC-%Pred-Post: 78 %
FVC-POST: 2.39 L
FVC-PRE: 2.48 L
POST FEV6/FVC RATIO: 100 %
PRE FEV6/FVC RATIO: 100 %
Post FEV1/FVC ratio: 95 %
Pre FEV1/FVC ratio: 92 %
RV % PRED: 99 %
RV: 1.35 L
TLC % PRED: 78 %
TLC: 3.8 L

## 2016-05-02 MED ORDER — ALBUTEROL SULFATE (2.5 MG/3ML) 0.083% IN NEBU
2.5000 mg | INHALATION_SOLUTION | Freq: Once | RESPIRATORY_TRACT | Status: AC
  Administered 2016-05-02: 2.5 mg via RESPIRATORY_TRACT

## 2016-05-03 LAB — HEPATITIS B CORE ANTIBODY, IGM: HEP B C IGM: NEGATIVE

## 2016-05-05 ENCOUNTER — Encounter (HOSPITAL_BASED_OUTPATIENT_CLINIC_OR_DEPARTMENT_OTHER): Payer: 59

## 2016-05-05 ENCOUNTER — Encounter (HOSPITAL_COMMUNITY): Payer: Self-pay | Admitting: Emergency Medicine

## 2016-05-05 ENCOUNTER — Ambulatory Visit (HOSPITAL_COMMUNITY): Payer: 59

## 2016-05-05 VITALS — BP 142/109 | HR 82 | Temp 98.0°F | Resp 28 | Wt 267.0 lb

## 2016-05-05 DIAGNOSIS — C8105 Nodular lymphocyte predominant Hodgkin lymphoma, lymph nodes of inguinal region and lower limb: Secondary | ICD-10-CM

## 2016-05-05 DIAGNOSIS — Z5112 Encounter for antineoplastic immunotherapy: Secondary | ICD-10-CM | POA: Diagnosis not present

## 2016-05-05 MED ORDER — DIPHENHYDRAMINE HCL 25 MG PO CAPS
ORAL_CAPSULE | ORAL | Status: AC
  Filled 2016-05-05: qty 2

## 2016-05-05 MED ORDER — SODIUM CHLORIDE 0.9 % IV SOLN
375.0000 mg/m2 | Freq: Once | INTRAVENOUS | Status: AC
  Administered 2016-05-05: 900 mg via INTRAVENOUS
  Filled 2016-05-05: qty 50

## 2016-05-05 MED ORDER — DIPHENHYDRAMINE HCL 50 MG/ML IJ SOLN
INTRAMUSCULAR | Status: AC
  Filled 2016-05-05: qty 1

## 2016-05-05 MED ORDER — HEPARIN SOD (PORK) LOCK FLUSH 100 UNIT/ML IV SOLN
500.0000 [IU] | Freq: Once | INTRAVENOUS | Status: AC | PRN
  Administered 2016-05-05: 500 [IU]
  Filled 2016-05-05: qty 5

## 2016-05-05 MED ORDER — DIPHENHYDRAMINE HCL 25 MG PO CAPS
50.0000 mg | ORAL_CAPSULE | Freq: Once | ORAL | Status: AC
  Administered 2016-05-05: 50 mg via ORAL

## 2016-05-05 MED ORDER — METHYLPREDNISOLONE SODIUM SUCC 125 MG IJ SOLR
INTRAMUSCULAR | Status: AC
  Filled 2016-05-05: qty 2

## 2016-05-05 MED ORDER — ACETAMINOPHEN 325 MG PO TABS
650.0000 mg | ORAL_TABLET | Freq: Once | ORAL | Status: AC
  Administered 2016-05-05: 650 mg via ORAL

## 2016-05-05 MED ORDER — METHYLPREDNISOLONE SODIUM SUCC 125 MG IJ SOLR
125.0000 mg | Freq: Once | INTRAMUSCULAR | Status: AC
  Administered 2016-05-05: 125 mg via INTRAVENOUS

## 2016-05-05 MED ORDER — SODIUM CHLORIDE 0.9 % IV SOLN
Freq: Once | INTRAVENOUS | Status: AC
  Administered 2016-05-05: 10:00:00 via INTRAVENOUS

## 2016-05-05 MED ORDER — DIPHENHYDRAMINE HCL 50 MG/ML IJ SOLN
25.0000 mg | Freq: Once | INTRAMUSCULAR | Status: AC
  Administered 2016-05-05: 25 mg via INTRAVENOUS

## 2016-05-05 MED ORDER — ACETAMINOPHEN 325 MG PO TABS
ORAL_TABLET | ORAL | Status: AC
Start: 2016-05-05 — End: 2016-05-05
  Filled 2016-05-05: qty 2

## 2016-05-05 MED ORDER — SODIUM CHLORIDE 0.9% FLUSH: 10.0000 mL | INTRAVENOUS | Status: DC | PRN

## 2016-05-05 NOTE — Patient Instructions (Signed)
[facility] Discharge Instructions for Patients Receiving Chemotherapy   Beginning January 23rd 2017 lab work for the [facility] will be done in the  Main lab at [facility] on 1st floor. If you have a lab appointment with the [place] please come in thru the  Main Entrance and check in at the main information desk   Today you received the following chemotherapy agent: Rituxan today.    Rituximab injection What is this medicine? RITUXIMAB (ri TUX i mab) is a monoclonal antibody. It is used commonly to treat non-Hodgkin lymphoma and other conditions. It is also used to treat rheumatoid arthritis (RA). In RA, this medicine slows the inflammatory process and help reduce joint pain and swelling. This medicine is often used with other cancer or arthritis medications. This medicine may be used for other purposes; ask your health care provider or pharmacist if you have questions. What should I tell my health care provider before I take this medicine? They need to know if you have any of these conditions: -blood disorders -heart disease -history of hepatitis B -infection (especially a virus infection such as chickenpox, cold sores, or herpes) -irregular heartbeat -kidney disease -lung or breathing disease, like asthma -lupus -an unusual or allergic reaction to rituximab, mouse proteins, other medicines, foods, dyes, or preservatives -pregnant or trying to get pregnant -breast-feeding How should I use this medicine? This medicine is for infusion into a vein. It is administered in a hospital or clinic by a specially trained health care professional. A special MedGuide will be given to you by the pharmacist with each prescription and refill. Be sure to read this information carefully each time. Talk to your pediatrician regarding the use of this medicine in children. This medicine is not approved for use in children. Overdosage: If you think you have taken too much of this  medicine contact a poison control center or emergency room at once. NOTE: This medicine is only for you. Do not share this medicine with others. What if I miss a dose? It is important not to miss a dose. Call your doctor or health care professional if you are unable to keep an appointment. What may interact with this medicine? -cisplatin -medicines for blood pressure -some other medicines for arthritis -vaccines This list may not describe all possible interactions. Give your health care provider a list of all the medicines, herbs, non-prescription drugs, or dietary supplements you use. Also tell them if you smoke, drink alcohol, or use illegal drugs. Some items may interact with your medicine. What should I watch for while using this medicine? Report any side effects that you notice during your treatment right away, such as changes in your breathing, fever, chills, dizziness or lightheadedness. These effects are more common with the first dose. Visit your prescriber or health care professional for checks on your progress. You will need to have regular blood work. Report any other side effects. The side effects of this medicine can continue after you finish your treatment. Continue your course of treatment even though you feel ill unless your doctor tells you to stop. Call your doctor or health care professional for advice if you get a fever, chills or sore throat, or other symptoms of a cold or flu. Do not treat yourself. This drug decreases your body's ability to fight infections. Try to avoid being around people who are sick. This medicine may increase your risk to bruise or bleed. Call your doctor or health care professional if you  notice any unusual bleeding. Be careful brushing and flossing your teeth or using a toothpick because you may get an infection or bleed more easily. If you have any dental work done, tell your dentist you are receiving this medicine. Avoid taking products that contain  aspirin, acetaminophen, ibuprofen, naproxen, or ketoprofen unless instructed by your doctor. These medicines may hide a fever. Do not become pregnant while taking this medicine. Women should inform their doctor if they wish to become pregnant or think they might be pregnant. There is a potential for serious side effects to an unborn child. Talk to your health care professional or pharmacist for more information. Do not breast-feed an infant while taking this medicine. What side effects may I notice from receiving this medicine? Side effects that you should report to your doctor or health care professional as soon as possible: -allergic reactions like skin rash, itching or hives, swelling of the face, lips, or tongue -low blood counts - this medicine may decrease the number of white blood cells, red blood cells and platelets. You may be at increased risk for infections and bleeding. -signs of infection - fever or chills, cough, sore throat, pain or difficulty passing urine -signs of decreased platelets or bleeding - bruising, pinpoint red spots on the skin, black, tarry stools, blood in the urine -signs of decreased red blood cells - unusually weak or tired, fainting spells, lightheadedness -breathing problems -confused, not responsive -chest pain -fast, irregular heartbeat -feeling faint or lightheaded, falls -mouth sores -redness, blistering, peeling or loosening of the skin, including inside the mouth -stomach pain -swelling of the ankles, feet, or hands -trouble passing urine or change in the amount of urine Side effects that usually do not require medical attention (report to your doctor or other health care professional if they continue or are bothersome): -anxiety -headache -loss of appetite -muscle aches -nausea -night sweats This list may not describe all possible side effects. Call your doctor for medical advice about side effects. You may report side effects to FDA at  1-800-FDA-1088. Where should I keep my medicine? This drug is given in a hospital or clinic and will not be stored at home. NOTE: This sheet is a summary. It may not cover all possible information. If you have questions about this medicine, talk to your doctor, pharmacist, or health care provider.    2016, Elsevier/Gold Standard. (2014-10-11 22:30:56)    If you develop nausea and vomiting, or diarrhea that is not controlled by your medication, call the clinic.  The clinic phone number is [phone]. Office hours are Monday-Friday 8:30am-5:00pm.  BELOW ARE SYMPTOMS THAT SHOULD BE REPORTED IMMEDIATELY:  *FEVER GREATER THAN 101.0 F  *CHILLS WITH OR WITHOUT FEVER  NAUSEA AND VOMITING THAT IS NOT CONTROLLED WITH YOUR NAUSEA MEDICATION  *UNUSUAL SHORTNESS OF BREATH  *UNUSUAL BRUISING OR BLEEDING  TENDERNESS IN MOUTH AND THROAT WITH OR WITHOUT PRESENCE OF ULCERS  *URINARY PROBLEMS  *BOWEL PROBLEMS  UNUSUAL RASH Items with * indicate a potential emergency and should be followed up as soon as possible. If you have an emergency after office hours please contact your primary care physician or go to the nearest emergency department.  Please call the clinic during office hours if you have any questions or concerns.   You may also contact the Patient Navigator at [phone] should you have any questions or need assistance in obtaining follow up care.      Resources For Cancer Patients and their Caregivers ? American Cancer  Society: Can assist with transportation, wigs, general needs, runs Look Good Feel Better.        (613)242-8658 ? Cancer Care: Provides financial assistance, online support groups, medication/co-pay assistance.  1-800-813-HOPE 507-575-2560) ? [place] Assists [place] cancer patients and their families through emotional , educational and financial support.  289-653-3105 ? [place] DSS Where to apply for food stamps,  Medicaid and utility assistance. 720 372 8535 ? RCATS: Transportation to medical appointments. 519-661-0400 ? Social Security Administration: May apply for disability if have a Stage IV cancer. 417-170-5443 319-519-1325 ? [employer], Disability and Transit Services: Assists with nutrition, care and transit needs. [phone]

## 2016-05-05 NOTE — Progress Notes (Signed)
Went to check on pt during first treatment of chemotherapy.  No questions or concerns at this time.

## 2016-05-05 NOTE — Progress Notes (Signed)
1200: in to do rate change for patient and she stated that her mouth was dry and that she felt congested in her nose.  I could audibly hear her nasal congestion.  Rituxan infusion paused.  VSS.  Dr. Wiley Magan Muse made aware and order for Benadryl IV obtained and given as ordered.  While in the room to administer the Benadryl, the patient reported hives to her back and under her breast, as well as they have started on her face.  Dr. Dajion Bickford Muse made aware and order for IV Solumedrol obtained and given as ordered.  Patient monitored for additional 15 minutes and was stable.  Left will call bell and instruction to call nurse with any changes.  She and her mother verbalized understanding.    1245: Patient reassessed and found to be asymptomatic as well as VSS.  Rituxan restarted at first rate for initial infusion.  Patient moved to bedroom for comfort and family is in the room.  Call bell at patient's side and they understand to call with any changes.  MD was down to see patient at this time.     Patient tolerated remainder of infusion well.  VSS.  Patient ambulatory and stable upon discharge.

## 2016-05-06 ENCOUNTER — Encounter: Payer: Self-pay | Admitting: *Deleted

## 2016-05-06 ENCOUNTER — Encounter (HOSPITAL_BASED_OUTPATIENT_CLINIC_OR_DEPARTMENT_OTHER): Payer: 59

## 2016-05-06 ENCOUNTER — Encounter (HOSPITAL_COMMUNITY): Payer: Self-pay

## 2016-05-06 VITALS — BP 160/99 | HR 110 | Temp 98.6°F | Resp 20 | Wt 267.6 lb

## 2016-05-06 DIAGNOSIS — R062 Wheezing: Secondary | ICD-10-CM | POA: Diagnosis not present

## 2016-05-06 DIAGNOSIS — Z5111 Encounter for antineoplastic chemotherapy: Secondary | ICD-10-CM | POA: Diagnosis not present

## 2016-05-06 DIAGNOSIS — C8105 Nodular lymphocyte predominant Hodgkin lymphoma, lymph nodes of inguinal region and lower limb: Secondary | ICD-10-CM

## 2016-05-06 DIAGNOSIS — G4700 Insomnia, unspecified: Secondary | ICD-10-CM

## 2016-05-06 MED ORDER — SODIUM CHLORIDE 0.9 % IV SOLN
750.0000 mg/m2 | Freq: Once | INTRAVENOUS | Status: AC
  Administered 2016-05-06: 1740 mg via INTRAVENOUS
  Filled 2016-05-06: qty 87

## 2016-05-06 MED ORDER — TEMAZEPAM 30 MG PO CAPS: 30.0000 mg | ORAL_CAPSULE | Freq: Every day | ORAL | 1 refills | Status: DC

## 2016-05-06 MED ORDER — PALONOSETRON HCL INJECTION 0.25 MG/5ML
0.2500 mg | Freq: Once | INTRAVENOUS | Status: AC
  Administered 2016-05-06: 0.25 mg via INTRAVENOUS

## 2016-05-06 MED ORDER — IPRATROPIUM-ALBUTEROL 0.5-2.5 (3) MG/3ML IN SOLN
3.0000 mL | RESPIRATORY_TRACT | Status: AC
  Administered 2016-05-06: 3 mL via RESPIRATORY_TRACT

## 2016-05-06 MED ORDER — HEPARIN SOD (PORK) LOCK FLUSH 100 UNIT/ML IV SOLN
500.0000 [IU] | Freq: Once | INTRAVENOUS | Status: AC | PRN
  Administered 2016-05-06: 500 [IU]
  Filled 2016-05-06: qty 5

## 2016-05-06 MED ORDER — ALBUTEROL SULFATE (2.5 MG/3ML) 0.083% IN NEBU
INHALATION_SOLUTION | RESPIRATORY_TRACT | Status: AC
  Filled 2016-05-06: qty 3

## 2016-05-06 MED ORDER — SODIUM CHLORIDE 0.9% FLUSH
10.0000 mL | INTRAVENOUS | Status: DC | PRN
  Administered 2016-05-06: 10 mL
  Filled 2016-05-06: qty 10

## 2016-05-06 MED ORDER — VINCRISTINE SULFATE CHEMO INJECTION 1 MG/ML
2.0000 mg | Freq: Once | INTRAVENOUS | Status: AC
  Administered 2016-05-06: 2 mg via INTRAVENOUS
  Filled 2016-05-06: qty 2

## 2016-05-06 MED ORDER — SODIUM CHLORIDE 0.9 % IV SOLN
Freq: Once | INTRAVENOUS | Status: AC
  Administered 2016-05-06: 10:00:00 via INTRAVENOUS

## 2016-05-06 MED ORDER — DOXORUBICIN HCL CHEMO IV INJECTION 2 MG/ML
50.0000 mg/m2 | Freq: Once | INTRAVENOUS | Status: AC
  Administered 2016-05-06: 116 mg via INTRAVENOUS
  Filled 2016-05-06: qty 58

## 2016-05-06 MED ORDER — IPRATROPIUM-ALBUTEROL 0.5-2.5 (3) MG/3ML IN SOLN
RESPIRATORY_TRACT | Status: AC
  Filled 2016-05-06: qty 3

## 2016-05-06 MED ORDER — TEMAZEPAM 30 MG PO CAPS
30.0000 mg | ORAL_CAPSULE | Freq: Every day | ORAL | 1 refills | Status: DC
Start: 2016-05-06 — End: 2016-05-06

## 2016-05-06 MED ORDER — PEGFILGRASTIM 6 MG/0.6ML ~~LOC~~ PSKT
6.0000 mg | PREFILLED_SYRINGE | Freq: Once | SUBCUTANEOUS | Status: AC
  Administered 2016-05-06: 6 mg via SUBCUTANEOUS
  Filled 2016-05-06: qty 0.6

## 2016-05-06 MED ORDER — PALONOSETRON HCL INJECTION 0.25 MG/5ML
INTRAVENOUS | Status: AC
  Filled 2016-05-06: qty 5

## 2016-05-06 MED ORDER — DEXAMETHASONE SODIUM PHOSPHATE 100 MG/10ML IJ SOLN
10.0000 mg | Freq: Once | INTRAMUSCULAR | Status: AC
  Administered 2016-05-06: 10 mg via INTRAVENOUS
  Filled 2016-05-06: qty 1

## 2016-05-06 NOTE — Progress Notes (Signed)
24 hour follow up. Patient presents today for day 2 of chemo. Patient states that her legs are hurting, aching . No problems yesterday after discharge. Vitals stable and will proceed with treatment today.  1237-Patient stated "her nostrils are burning". Vitals done, DR.Penland made aware. MD assessed patient ordered a duo-neb. No other complaints noted. Will proceed as ordered.  Marland KitchenRaford Pitcher received  ONPRO neulasta on body injector. See MAR for administration details. Injector in place and engaged with green light indicator on flashing. Tolerated application with out problems.   Dr.Penland reassessed patient at end of chemo today. Patient is stable, no other complaints noted. Vitals stable, Discharged ambulatory form clinic with family.

## 2016-05-06 NOTE — Patient Instructions (Signed)
[facility] Discharge Instructions for Patients Receiving Chemotherapy   Beginning January 23rd 2017 lab work for the [facility] will be done in the  Main lab at [facility] on 1st floor. If you have a lab appointment with the [place] please come in thru the  Main Entrance and check in at the main information desk   Today you received the following chemotherapy agents Adriamycin, vincristine and cytoxan  To help prevent nausea and vomiting after your treatment, we encourage you to take your nausea medication      If you develop nausea and vomiting, or diarrhea that is not controlled by your medication, call the clinic.  The clinic phone number is [phone]. Office hours are Monday-Friday 8:30am-5:00pm.  BELOW ARE SYMPTOMS THAT SHOULD BE REPORTED IMMEDIATELY:  *FEVER GREATER THAN 101.0 F  *CHILLS WITH OR WITHOUT FEVER  NAUSEA AND VOMITING THAT IS NOT CONTROLLED WITH YOUR NAUSEA MEDICATION  *UNUSUAL SHORTNESS OF BREATH  *UNUSUAL BRUISING OR BLEEDING  TENDERNESS IN MOUTH AND THROAT WITH OR WITHOUT PRESENCE OF ULCERS  *URINARY PROBLEMS  *BOWEL PROBLEMS  UNUSUAL RASH Items with * indicate a potential emergency and should be followed up as soon as possible. If you have an emergency after office hours please contact your primary care physician or go to the nearest emergency department.  Please call the clinic during office hours if you have any questions or concerns.   You may also contact the Patient Navigator at [phone] should you have any questions or need assistance in obtaining follow up care.      Resources For Cancer Patients and their Caregivers ? American Cancer Society: Can assist with transportation, wigs, general needs, runs Look Good Feel Better.        540-370-5738 ? Cancer Care: Provides financial assistance, online support groups, medication/co-pay assistance.  1-800-813-HOPE 2541128035) ? [place] Assists [place] cancer patients and their families through emotional , educational and financial support.  (907)030-7898 ? [place] DSS Where to apply for food stamps, Medicaid and utility assistance. 225-702-8692 ? RCATS: Transportation to medical appointments. 321-560-1961 ? Social Security Administration: May apply for disability if have a Stage IV cancer. 239-102-2371 (931)431-2206 ? [employer], Disability and Transit Services: Assists with nutrition, care and transit needs. 7087978507

## 2016-05-06 NOTE — Progress Notes (Signed)
[place] Clinical Social Work  Clinical Social Work was referred by nurse for assessment of psychosocial needs due to pt 30 recently starting treatment. Clinical Social Worker met with patient at [facility] to offer support and assess for needs. CSW introduced self, reviewed role of CSW/Support Programs available for support and resources available. Pt reports she has an excellent support system of extended family and friends. She reports friends have started a meal train and are very supportive. Pt reports to have a 11 yo daughter and 70 yo son. Both are currently attending school; pre-k and 7 th grade. Pt reports both appear to be coping well with her condition. Pt reports she has informed the schools of her illness. Pt reports to work for [employer] from home and plans to continue to do so through treatment. She denies other needs right now. CSW provided her with handouts about role of CSW, schedule of groups and other resources. Pt and husband agree to reach out as needed.      Clinical Social Work interventions: Resource education Supportive listening   [place], [place] Tuesdays   [phone]

## 2016-05-07 ENCOUNTER — Telehealth (HOSPITAL_COMMUNITY): Payer: Self-pay

## 2016-05-07 NOTE — Telephone Encounter (Signed)
24 hour follow up: Patient was sleeping, nauseated but taking nausea medication. She stated it is helping. No vomiting, drinking fluids. Just feeling fatigued today, has that metal taste in her mouth per patient. Pt encouraged to call if she has any other questions or complaints.

## 2016-05-09 ENCOUNTER — Other Ambulatory Visit (HOSPITAL_COMMUNITY): Payer: Self-pay | Admitting: Emergency Medicine

## 2016-05-09 MED ORDER — CIPROFLOXACIN HCL 500 MG PO TABS: 500.0000 mg | ORAL_TABLET | Freq: Two times a day (BID) | ORAL | 0 refills | Status: DC

## 2016-05-09 NOTE — Progress Notes (Signed)
cipro called into her pharmacy for 7 days. Pt calleld and complained that she was having burning when she urinated. Spoke with Dr Whitney Muse, orders received, pt verbalized understanding.

## 2016-05-11 ENCOUNTER — Encounter (HOSPITAL_COMMUNITY): Payer: Self-pay | Admitting: Emergency Medicine

## 2016-05-11 ENCOUNTER — Emergency Department (HOSPITAL_COMMUNITY): Payer: 59

## 2016-05-11 ENCOUNTER — Emergency Department (HOSPITAL_COMMUNITY)
Admission: EM | Admit: 2016-05-11 | Discharge: 2016-05-11 | Disposition: A | Discharge status: Home / Self Care | Payer: 59 | Attending: Emergency Medicine | Admitting: Emergency Medicine

## 2016-05-11 DIAGNOSIS — R300 Dysuria: Secondary | ICD-10-CM | POA: Insufficient documentation

## 2016-05-11 DIAGNOSIS — K5900 Constipation, unspecified: Secondary | ICD-10-CM | POA: Insufficient documentation

## 2016-05-11 DIAGNOSIS — Z791 Long term (current) use of non-steroidal anti-inflammatories (NSAID): Secondary | ICD-10-CM | POA: Insufficient documentation

## 2016-05-11 DIAGNOSIS — R1032 Left lower quadrant pain: Secondary | ICD-10-CM | POA: Diagnosis not present

## 2016-05-11 DIAGNOSIS — J45909 Unspecified asthma, uncomplicated: Secondary | ICD-10-CM | POA: Insufficient documentation

## 2016-05-11 DIAGNOSIS — Z79899 Other long term (current) drug therapy: Secondary | ICD-10-CM | POA: Diagnosis not present

## 2016-05-11 DIAGNOSIS — R319 Hematuria, unspecified: Secondary | ICD-10-CM

## 2016-05-11 DIAGNOSIS — R112 Nausea with vomiting, unspecified: Secondary | ICD-10-CM | POA: Diagnosis not present

## 2016-05-11 DIAGNOSIS — I10 Essential (primary) hypertension: Secondary | ICD-10-CM | POA: Diagnosis not present

## 2016-05-11 LAB — CBC WITH DIFFERENTIAL/PLATELET
BASOS PCT: 0 %
Basophils Absolute: 0 10*3/uL (ref 0.0–0.1)
Eosinophils Absolute: 0.3 10*3/uL (ref 0.0–0.7)
Eosinophils Relative: 3 %
HEMATOCRIT: 40.3 % (ref 36.0–46.0)
HEMOGLOBIN: 13 g/dL (ref 12.0–15.0)
LYMPHS ABS: 1.5 10*3/uL (ref 0.7–4.0)
Lymphocytes Relative: 17 %
MCH: 26.5 pg (ref 26.0–34.0)
MCHC: 32.3 g/dL (ref 30.0–36.0)
MCV: 82.1 fL (ref 78.0–100.0)
MONOS PCT: 2 %
Monocytes Absolute: 0.2 10*3/uL (ref 0.1–1.0)
NEUTROS ABS: 7 10*3/uL (ref 1.7–7.7)
NEUTROS PCT: 78 %
Platelets: 259 10*3/uL (ref 150–400)
RBC: 4.91 MIL/uL (ref 3.87–5.11)
RDW: 13.5 % (ref 11.5–15.5)
WBC: 9 10*3/uL (ref 4.0–10.5)

## 2016-05-11 LAB — BASIC METABOLIC PANEL
Anion gap: 1 — ABNORMAL LOW (ref 5–15)
BUN: 12 mg/dL (ref 6–20)
CHLORIDE: 101 mmol/L (ref 101–111)
CO2: 32 mmol/L (ref 22–32)
CREATININE: 0.65 mg/dL (ref 0.44–1.00)
Calcium: 8.5 mg/dL — ABNORMAL LOW (ref 8.9–10.3)
GFR calc non Af Amer: >60 mL/min (ref >60)
Glucose, Bld: 86 mg/dL (ref 65–99)
POTASSIUM: 3.3 mmol/L — AB (ref 3.5–5.1)
Sodium: 134 mmol/L — ABNORMAL LOW (ref 135–145)

## 2016-05-11 LAB — URINALYSIS, ROUTINE W REFLEX MICROSCOPIC
Bilirubin Urine: NEGATIVE
GLUCOSE, UA: NEGATIVE mg/dL
KETONES UR: NEGATIVE mg/dL
Nitrite: NEGATIVE
PROTEIN: 100 mg/dL — AB
Specific Gravity, Urine: 1.02 (ref 1.005–1.030)
pH: 7 (ref 5.0–8.0)

## 2016-05-11 LAB — URINE MICROSCOPIC-ADD ON

## 2016-05-11 LAB — PREGNANCY, URINE: Preg Test, Ur: NEGATIVE

## 2016-05-11 MED ORDER — IOPAMIDOL (ISOVUE-300) INJECTION 61%
INTRAVENOUS | Status: AC
  Administered 2016-05-11: 100 mL
  Filled 2016-05-11: qty 30

## 2016-05-11 MED ORDER — CEPHALEXIN 500 MG PO CAPS: 500.0000 mg | ORAL_CAPSULE | Freq: Four times a day (QID) | ORAL | 0 refills | Status: DC

## 2016-05-11 MED ORDER — PHENAZOPYRIDINE HCL 200 MG PO TABS: 200.0000 mg | ORAL_TABLET | Freq: Three times a day (TID) | ORAL | 0 refills | Status: DC

## 2016-05-11 MED ORDER — IOPAMIDOL (ISOVUE-300) INJECTION 61%: 100.0000 mL | Freq: Once | INTRAVENOUS | Status: DC | PRN

## 2016-05-11 NOTE — ED Provider Notes (Signed)
[facility] DEPT Provider Note   CSN: [id number] Arrival date & time: 05/11/16  0909   By signing my name below, I, [name], attest that this documentation has been prepared under the direction and in the presence of Nat Christen, MD . Electronically Signed: Dolores [name], Scribe. 05/11/2016. 9:22 AM.  History   Chief Complaint Chief Complaint  Patient presents with  . Hematuria   The history is provided by the patient. No language interpreter was used.   HPI Comments:  [name] is a 30 y.o. female with PMHx of Ca who presents to the Emergency Department complaining of constant worsening hematuria onset 15 hours ago. Pt describes her symptoms as starting with a burning sensation 2 days ago and worsening to hematuria in the past 15 hours. She endorses associated nausea, constipation, decreased urine, dysuria, abdominal pain, bilateral flank pain, bloody stool, and vomiting x1. 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 She denies any fevers, chills or use of blood thinners. Pt is compliant with allopurinol medication and is having no problem drinking or eating.  Past Medical History:  Diagnosis Date  . Asthma    had since childhood; has not had to use inhaler or nebulizer for 8-9 years. Been doing well.  Marland Kitchen Headache(784.0)    usually controlled c Tylenol when not pregnant, but more painful headaches  during preg. due to hypertension.  . Hidradenitis suppurativa   . Hodgkin lymphoma, nodular lymphocyte predominance ([place]) 04/15/2016  . Hypertension December 2011   Been on Aldomet since Dec. 2011  . Sleep apnea     Patient Active Problem List   Diagnosis Date Noted  . Hodgkin lymphoma, nodular lymphocyte predominance ([place]) 04/15/2016  . Encounter for gynecological examination 06/01/2014  . Hypertension 02/02/2013  . Abscess of breast, right 09/01/2012    . Hypertension in pregnancy, preeclampsia, severe, antepartum 12/11/2011  . Chronic hypertension complicating or reason for care during pregnancy 12/11/2011    Past Surgical History:  Procedure Laterality Date  . ADENOIDECTOMY     age 45  . BREAST RECONSTRUCTION  December 2001   breast reduction at age 56  . MASS EXCISION Right 04/07/2016   Procedure: EXCISION/BIOPSY SOFT TISSUE MASS RIGHT THIGH;  Surgeon: Aviva Signs, MD;  Location: [facility];  Service: General;  Laterality: Right;  . port a cath placed    . PORTACATH PLACEMENT Left 04/28/2016   Procedure: INSERTION PORT-A-CATH;  Surgeon: Aviva Signs, MD;  Location: [facility];  Service: General;  Laterality: Left;    OB History    Gravida Para Term Preterm AB Living   3 2 1 1 1 2    SAB TAB Ectopic Multiple Live Births   1 0 0 0 1       Home Medications    Prior to Admission medications   Medication Sig Start Date End Date Taking? Authorizing Provider  allopurinol (ZYLOPRIM) 300 MG tablet Take 1 tablet (300 mg total) by mouth daily. 04/29/16  Yes Patrici Ranks, MD  ALPRAZolam Duanne Moron) 0.5 MG tablet Take 1 tablet (0.5 mg total) by mouth 3 (three) times daily as needed for anxiety. 04/24/16  Yes Patrici Ranks, MD  ciprofloxacin (CIPRO) 500 MG tablet Take 1 tablet (500 mg total) by mouth 2 (two) times daily. 05/09/16  Yes Patrici Ranks, MD  CYCLOPHOSPHAMIDE IV Inject into the vein. Every 21 days   Yes Historical  Provider, MD  DOXORUBICIN HCL IV Inject into the vein. Every 21 days   Yes Historical Provider, MD  HYDROcodone-acetaminophen (NORCO) 5-325 MG tablet Take 1-2 tablets by mouth every 4 (four) hours as needed for moderate pain. 04/28/16  Yes Aviva Signs, MD  ibuprofen (ADVIL,MOTRIN) 200 MG tablet Take 400-600 mg by mouth every 6 (six) hours as needed for moderate pain.   Yes Historical Provider, MD  levonorgestrel (MIRENA) 20 MCG/24HR IUD 1 each by Intrauterine route once.   Yes Historical Provider, MD  lidocaine-prilocaine  (EMLA) cream Apply to affected area once 04/29/16  Yes Patrici Ranks, MD  ondansetron (ZOFRAN) 8 MG tablet Take 1 tablet (8 mg total) by mouth 2 (two) times daily as needed for refractory nausea / vomiting. Start on day 3 after cyclophosphamide. 04/29/16  Yes Patrici Ranks, MD  Pegfilgrastim (NEULASTA ONPRO [place]) Inject into the skin. Every 21 days   Yes Historical Provider, MD  predniSONE (DELTASONE) 20 MG tablet Take 4.5 tablets (90 mg total) by mouth daily. Take on days 1-5 of chemotherapy. 04/29/16  Yes Baird Cancer, PA-C  prochlorperazine (COMPAZINE) 10 MG tablet Take 1 tablet (10 mg total) by mouth every 6 (six) hours as needed (Nausea or vomiting). 04/29/16  Yes Patrici Ranks, MD  RiTUXimab (RITUXAN IV) Inject into the vein. Every 21 days   Yes Historical Provider, MD  temazepam (RESTORIL) 30 MG capsule Take 1 capsule (30 mg total) by mouth at bedtime. 05/06/16  Yes Patrici Ranks, MD  VINCRISTINE SULFATE IV Inject into the vein. Every 21 days   Yes Historical Provider, MD  Vitamin D, Ergocalciferol, (DRISDOL) 50000 units CAPS capsule Take 50,000 Units by mouth every 7 (seven) days. Takes on Mondays.   Yes Historical Provider, MD  cephALEXin (KEFLEX) 500 MG capsule Take 1 capsule (500 mg total) by mouth 4 (four) times daily. 05/11/16   Nat Christen, MD  phenazopyridine (PYRIDIUM) 200 MG tablet Take 1 tablet (200 mg total) by mouth 3 (three) times daily. 05/11/16   Nat Christen, MD    Family History Family History  Problem Relation Age of Onset  . Hypertension Mother   . Cancer Mother   . Hypertension Father   . Heart disease Maternal Grandmother     Social History Social History  Substance Use Topics  . Smoking status: Never Smoker  . Smokeless tobacco: Never Used  . Alcohol use No     Allergies   Review of patient's allergies indicates no known allergies.   Review of Systems Review of Systems  Constitutional: Negative for chills and fever.  Gastrointestinal:  Positive for abdominal pain, blood in stool, constipation, nausea and vomiting.  Genitourinary: Positive for decreased urine volume, dysuria, flank pain and hematuria.  All other systems reviewed and are negative.    Physical Exam Updated Vital Signs BP 134/96 (BP Location: Left Arm)   Pulse 97   Temp 98 F (36.7 C) (Oral)   Resp 18   Ht 5\' 3"  (1.6 m)   Wt 267 lb (121.1 kg)   SpO2 97%   BMI 47.30 kg/m   Physical Exam  Constitutional: She is oriented to person, place, and time. She appears well-developed and well-nourished.  Obese, but no acute distress.  HENT:  Head: Normocephalic and atraumatic.  Eyes: Conjunctivae are normal.  Neck: Neck supple.  Cardiovascular: Normal rate and regular rhythm.   Pulmonary/Chest: Effort normal and breath sounds normal.  Abdominal: Soft. Bowel sounds are normal.  Minimal lower abdominal  tenderness greater on the left side  Musculoskeletal: Normal range of motion.  Minimal bilateral flank tenderness.  Neurological: She is alert and oriented to person, place, and time.  Skin: Skin is warm and dry.  Psychiatric: She has a normal mood and affect. Her behavior is normal.  Nursing note and vitals reviewed.    ED Treatments / Results  DIAGNOSTIC STUDIES:  Oxygen Saturation is 95% on RA, adequate by my interpretation.    Labs (all labs ordered are listed, but only abnormal results are displayed) Labs Reviewed  URINALYSIS, ROUTINE W REFLEX MICROSCOPIC (NOT AT [facility]) - Abnormal; Notable for the following:       Result Value   APPearance CLOUDY (*)    Hgb urine dipstick LARGE (*)    Protein, ur 100 (*)    Leukocytes, UA TRACE (*)    All other components within normal limits  URINE MICROSCOPIC-ADD ON - Abnormal; Notable for the following:    Squamous Epithelial / LPF 0-5 (*)    Bacteria, UA FEW (*)    All other components within normal limits  BASIC METABOLIC PANEL - Abnormal; Notable for the following:    Sodium 134 (*)    Potassium  3.3 (*)    Calcium 8.5 (*)    Anion gap 1 (*)    All other components within normal limits  URINE CULTURE  PREGNANCY, URINE  CBC WITH DIFFERENTIAL/PLATELET    EKG  EKG Interpretation None       Radiology Ct Abdomen Pelvis W Contrast  Result Date: 05/11/2016 CLINICAL DATA:  Hematuria. Stage IV Hodgkin's lymphoma, nodular lymphocyte-predominant EXAM: CT ABDOMEN AND PELVIS WITH CONTRAST TECHNIQUE: Multidetector CT imaging of the abdomen and pelvis was performed using the standard protocol following bolus administration of intravenous contrast. Oral contrast was also administered. CONTRAST:  171mL ISOVUE-300 IOPAMIDOL (ISOVUE-300) INJECTION 61% COMPARISON:  PET-CT April 25, 2016 FINDINGS: Lower chest: Lung bases are clear. Hepatobiliary: No focal liver lesions are evident. There is questionable sludge in the gallbladder. The gallbladder wall does not appear appreciably thickened. There is no biliary duct dilatation. Pancreas: No pancreatic mass or inflammatory focus. Spleen: No splenomegaly.  No splenic lesions are evident. Adrenals/Urinary Tract: Adrenals appear normal bilaterally. Kidneys bilaterally show no evidence of mass or hydronephrosis on either side. There is no renal or ureteral calculus on either side. Urinary bladder is midline. The urinary bladder wall appears thickened along its posterior inferior aspects. No urinary bladder mass evident. Stomach/Bowel: There is no appreciable bowel wall or mesenteric thickening. No bowel obstruction. No free air or portal venous air. Vascular/Lymphatic: There are prominent lymph nodes bilaterally in each external iliac node chain. There is a lymph node in the right external iliac node chain measuring 2.6 x 1.7 cm, slightly smaller than on recent PET study. There is a lymph node in the left external iliac node chain measuring 1.8 x 0.9 cm, marginally smaller. A second lymph node in this area measures 1.9 x 1.3 cm, similar to recent study. There is  mild adenopathy in the inguinal regions bilaterally. There is incomplete visualization of a known significantly enlarged lymph node in the right inguinal region anteriorly. Only a small portion of this lymph node is seen. Other lymph nodes in the inguinal regions appear essentially stable. These areas of adenopathy showed abnormal uptake on recent PET study. Elsewhere, there are scattered subcentimeter mesenteric lymph nodes which appear stable. No new areas of adenopathy are appreciable. There is no apparent vascular lesion.  No abdominal aortic  aneurysm. Reproductive: Uterus is anteverted. Intrauterine device is positioned within the uterus. There is no pelvic mass or pelvic fluid collection beyond the adenopathy noted in the pelvis. Other: Appendix appears normal. No ascites or abscess evident in the abdomen or pelvis. There is a small ventral hernia containing only fat. Musculoskeletal: No blastic or lytic bone lesions are evident by CT. No intramuscular or abdominal wall lesion is evident. IMPRESSION: Urinary bladder wall thickening along the posterior and inferior aspects of the bladder. Question a degree of cystitis. No renal or ureteral calculus. No hydronephrosis. Pelvic and inguinal adenopathy, also noted on recent PET study with abnormal radiotracer uptake in these areas. There is only partial visualization of a known significantly enlarged right inguinal lymph node currently. There are subcentimeter mesenteric lymph nodes, stable and of uncertain significance. Intrauterine device positioned within the uterus. No bowel obstruction. No abscess. Appendix appears normal. No focal liver lesions are evident. There is questionable sludge in the gallbladder without gallbladder wall thickening. Electronically Signed   By: Lowella Grip III M.D.   On: 05/11/2016 14:21    Procedures Procedures (including critical care time)  Medications Ordered in ED Medications  iopamidol (ISOVUE-300) 61 % injection  100 mL (not administered)  iopamidol (ISOVUE-300) 61 % injection (100 mLs  Contrast Given 05/11/16 1321)     Initial Impression / Assessment and Plan / ED Course  I have reviewed the triage vital signs and the nursing notes.  Pertinent labs & imaging results that were available during my care of the patient were reviewed by me and considered in my medical decision making (see chart for details).  Clinical Course   COORDINATION OF CARE:  9:27 AM Discussed treatment plan with pt at bedside which included Urinalysis and pt agreed to plan.  No acute abdomen. Urinalysis shows minimal evidence of infection. Will discontinue Cipro and start Keflex 500 mg and Pyridium 20 mg. Urine culture pending. Bladder may be inflamed secondary to her cancer treatment.  She has oncology follow-up.  Final Clinical Impressions(s) / ED Diagnoses   Final diagnoses:  Hematuria    New Prescriptions New Prescriptions   CEPHALEXIN (KEFLEX) 500 MG CAPSULE    Take 1 capsule (500 mg total) by mouth 4 (four) times daily.   PHENAZOPYRIDINE (PYRIDIUM) 200 MG TABLET    Take 1 tablet (200 mg total) by mouth 3 (three) times daily.  I personally performed the services described in this documentation, which was scribed in my presence. The recorded information has been reviewed and is accurate.      Nat Christen, MD 05/11/16 [phone]

## 2016-05-11 NOTE — ED Triage Notes (Signed)
Pt c/o UTI sx since Friday. Started on Cipro PO Friday. C/o worsening pain and hematuria today.

## 2016-05-11 NOTE — ED Notes (Signed)
Patient transported to CT 

## 2016-05-11 NOTE — Discharge Instructions (Signed)
Recommend changing antibiotics. Urine culture is pending. Additionally, I have prescribed medicine for urinary pain. As a thought, your bladder may be irritated from your cancer therapy. Follow-up with your oncologist.

## 2016-05-11 NOTE — ED Notes (Signed)
Pt ambulating to BR °

## 2016-05-12 ENCOUNTER — Other Ambulatory Visit (HOSPITAL_COMMUNITY): Payer: 59

## 2016-05-13 ENCOUNTER — Encounter (HOSPITAL_COMMUNITY): Payer: 59

## 2016-05-13 ENCOUNTER — Encounter (HOSPITAL_COMMUNITY): Payer: Self-pay | Admitting: Oncology

## 2016-05-13 ENCOUNTER — Encounter (HOSPITAL_BASED_OUTPATIENT_CLINIC_OR_DEPARTMENT_OTHER): Payer: 59 | Admitting: Oncology

## 2016-05-13 VITALS — BP 138/107 | HR 102 | Temp 98.0°F | Resp 18 | Wt 267.4 lb

## 2016-05-13 DIAGNOSIS — R102 Pelvic and perineal pain: Secondary | ICD-10-CM | POA: Diagnosis not present

## 2016-05-13 DIAGNOSIS — R309 Painful micturition, unspecified: Secondary | ICD-10-CM

## 2016-05-13 DIAGNOSIS — R110 Nausea: Secondary | ICD-10-CM

## 2016-05-13 DIAGNOSIS — N23 Unspecified renal colic: Secondary | ICD-10-CM | POA: Diagnosis not present

## 2016-05-13 DIAGNOSIS — C8105 Nodular lymphocyte predominant Hodgkin lymphoma, lymph nodes of inguinal region and lower limb: Secondary | ICD-10-CM

## 2016-05-13 DIAGNOSIS — R319 Hematuria, unspecified: Secondary | ICD-10-CM | POA: Diagnosis not present

## 2016-05-13 DIAGNOSIS — C8100 Nodular lymphocyte predominant Hodgkin lymphoma, unspecified site: Secondary | ICD-10-CM

## 2016-05-13 DIAGNOSIS — K5900 Constipation, unspecified: Secondary | ICD-10-CM

## 2016-05-13 LAB — URINALYSIS, ROUTINE W REFLEX MICROSCOPIC
BILIRUBIN URINE: NEGATIVE
Glucose, UA: NEGATIVE mg/dL
KETONES UR: NEGATIVE mg/dL
NITRITE: NEGATIVE
PH: 8.5 — AB (ref 5.0–8.0)
Protein, ur: >300 mg/dL — AB
SPECIFIC GRAVITY, URINE: 1.02 (ref 1.005–1.030)

## 2016-05-13 LAB — CBC WITH DIFFERENTIAL/PLATELET
BASOS ABS: 0 10*3/uL (ref 0.0–0.1)
BASOS PCT: 0 %
EOS ABS: 0.4 10*3/uL (ref 0.0–0.7)
Eosinophils Relative: 7 %
HCT: 36.9 % (ref 36.0–46.0)
Hemoglobin: 11.9 g/dL — ABNORMAL LOW (ref 12.0–15.0)
LYMPHS ABS: 1.5 10*3/uL (ref 0.7–4.0)
LYMPHS PCT: 30 %
MCH: 26.7 pg (ref 26.0–34.0)
MCHC: 32.2 g/dL (ref 30.0–36.0)
MCV: 82.7 fL (ref 78.0–100.0)
MONO ABS: 0.3 10*3/uL (ref 0.1–1.0)
Monocytes Relative: 6 %
NEUTROS ABS: 2.9 10*3/uL (ref 1.7–7.7)
Neutrophils Relative %: 57 %
PLATELETS: 253 10*3/uL (ref 150–400)
RBC: 4.46 MIL/uL (ref 3.87–5.11)
RDW: 13.5 % (ref 11.5–15.5)
WBC Morphology: INCREASED
WBC: 5.1 10*3/uL (ref 4.0–10.5)

## 2016-05-13 LAB — COMPREHENSIVE METABOLIC PANEL
ALBUMIN: 3.4 g/dL — AB (ref 3.5–5.0)
ALT: 16 U/L (ref 14–54)
ANION GAP: 3 — AB (ref 5–15)
AST: 12 U/L — ABNORMAL LOW (ref 15–41)
Alkaline Phosphatase: 48 U/L (ref 38–126)
BUN: 9 mg/dL (ref 6–20)
CALCIUM: 8.7 mg/dL — AB (ref 8.9–10.3)
CO2: 30 mmol/L (ref 22–32)
Chloride: 102 mmol/L (ref 101–111)
Creatinine, Ser: 0.58 mg/dL (ref 0.44–1.00)
GFR calc non Af Amer: >60 mL/min (ref >60)
GLUCOSE: 87 mg/dL (ref 65–99)
POTASSIUM: 3.6 mmol/L (ref 3.5–5.1)
SODIUM: 135 mmol/L (ref 135–145)
Total Bilirubin: 0.4 mg/dL (ref 0.3–1.2)
Total Protein: 6.3 g/dL — ABNORMAL LOW (ref 6.5–8.1)

## 2016-05-13 LAB — URINE CULTURE

## 2016-05-13 LAB — URINE MICROSCOPIC-ADD ON

## 2016-05-13 LAB — URIC ACID: Uric Acid, Serum: 2.7 mg/dL (ref 2.3–6.6)

## 2016-05-13 NOTE — Assessment & Plan Note (Addendum)
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 Her case has been discussed with [facility] and based upon guidelines, reading, and [facility] consultation [facility] is being recommended. She started R-CHOP with neulasta support on 05/05/2016.  Oncology history is updated.  Labs today: CBC diff, CMET, uric acid.  I personally reviewed and went over laboratory results with the patient.  The results are noted within this dictation.  Labs are very good for her NADIR check.  She tolerated her first treatment reasonably well with constipation and one episode of nausea with vomiting.  This could be related to constipation as well based upon history provided by the patient.  Constipation sheet is provided.  BMs back to normal now.  ED visit is noted with a change in antibiotic regimen for suspected UTI.  She continues with urinary pain, pelvic pain, and hematuria.  I will order another UA and C+S today.  If positive for blood, will call urology.  Risk of Cytoxan-induced hemorrhagic cystitis is low given dose and length of treatment at this time.  CT imaging from ED not only demonstrated a response to therapy, but there is also some thickened bladder wall suspicious for cystitis.  I have reviewed the NCCN guidelines.  Restaging images are performed following completion of systemic chemotherapy +/- ISRT.  "Reevaluation with PET should be done for all patients after completion of initial therapy.  Observation is recommended for all asymptomatic patients with a clinical response. ISRT is recommended if not received previously. Biopsy is recommended for patients with a stable or progressive disease. Asymptomatic patients with a negative biopsy can be observed and those with a positive biopsy should be managed as described for relapsed or refractory disease."  Return in 2 weeks for follow-up and cycle #2 of treatment.  She will call in the interim with any questions or concerns.  Addendum: Urine sample ascertained.  Not  enough for culture and sensitivity testing.  UA with reflex ordered.  Gross hematuria noted on exam of urine specimen.  UA confirms.  Discussed with Estill Bamberg, nurse at [facility], who was able to get the patient an appointment on 05/21/2016 at 10 AM with patient expected arrival time at 9:30 AM.

## 2016-05-13 NOTE — Progress Notes (Signed)
Pcp Not In System No address on file  Nodular lymphocyte predominant Hodgkin lymphoma, unspecified body region [facility])  Urinary pain - Plan: Urinalysis, Routine w reflex microscopic, Urinalysis, Routine w reflex microscopic  CURRENT THERAPY: R-CHOP with Neulasta support beginning on 05/05/2016.  INTERVAL HISTORY: [name] 30 y.o. 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 Her case has been discussed with [facility] and based upon guidelines, reading, and [facility] consultation RCHOP is being recommended.     Hodgkin lymphoma, nodular lymphocyte predominance ([place])   04/07/2016 Procedure    Excisional lymph node biopsy of right thigh      04/11/2016 Pathology Results    Nodular lymphocyte predominant hodgkin Lymphoma      04/23/2016 Miscellaneous    Normal ESR, LDH      04/25/2016 PET scan    1. Hypermetabolic enlarged bilateral axillary, bilateral external iliac and bilateral inguinal lymph nodes consistent with lymphoma. 2. Hypermetabolic foci of skin thickening in the bilateral axilla and bilateral lower ventral chest wall, nonspecific, cannot exclude cutaneous lymphoma. 3. Hypermetabolism throughout Waldeyer's ring with associated mucosal thickening on the CT images, which could be inflammatory or due to lymphoma. 4. No splenic enlargement or hypermetabolism. 5. Diffuse hypermetabolism throughout the axial skeleton without discrete bone lesions on the CT images, worrisome for diffuse osseous involvement by lymphoma.      04/29/2016 Imaging    MUGA- Left ventricular ejection fraction equals 66 %.      05/05/2016 -  Chemotherapy    The patient had DOXOrubicin (ADRIAMYCIN) chemo injection 116 mg, 50 mg/m2 = 116 mg, Intravenous,  Once, 1 of 6 cycles  palonosetron (ALOXI) injection 0.25 mg, 0.25 mg, Intravenous,  Once, 1 of 6 cycles  pegfilgrastim (NEULASTA ONPRO KIT) injection 6 mg, 6 mg, Subcutaneous, Once, 1 of 6  cycles  vinCRIStine (ONCOVIN) 2 mg in sodium chloride 0.9 % 50 mL chemo infusion, 2 mg, Intravenous,  Once, 1 of 6 cycles  riTUXimab (RITUXAN) 900 mg in sodium chloride 0.9 % 250 mL (2.6471 mg/mL) chemo infusion, 375 mg/m2 = 900 mg, Intravenous,  Once, 1 of 6 cycles  cyclophosphamide (CYTOXAN) 1,740 mg in sodium chloride 0.9 % 250 mL chemo infusion, 750 mg/m2 = 1,740 mg, Intravenous,  Once, 1 of 6 cycles  for chemotherapy treatment.        She reports constipation x 5 days.  She used a laxative and her bowels moved.  They are back to normal now.  She notes 1 episode with sudden onset of vomiting while on the toilet moving her bowels.  She notes emesis containing food products.  She notes immediate resolution post-emesis.  She notes some minimal-mild nausea that is well controlled with Zofran antiemetic.  She is educated on the constipating side effect of Zofran as well.  She reports urinary pain, pelvis pain, gross hematuria, and feeling of incomplete emptying.  She is tolerating antibiotic prescribed, but no improvement in symptoms.  Review of Systems  Constitutional: Negative.  Negative for chills, fever and weight loss.  HENT: Negative.   Eyes: Negative.   Respiratory: Negative.  Negative for cough.   Cardiovascular: Negative.  Negative for chest pain.  Gastrointestinal: Positive for constipation, nausea and vomiting. Negative for diarrhea.  Genitourinary: Positive for dysuria, frequency, hematuria and urgency.  Musculoskeletal: Negative.   Skin: Negative.   Neurological: Negative.  Negative for weakness.  Endo/Heme/Allergies: Negative.   Psychiatric/Behavioral: Negative.     Past Medical History:  Diagnosis Date  . Asthma    had since childhood; has not had to use inhaler or nebulizer for 8-9 years. Been doing well.  Marland Kitchen Headache(784.0)    usually controlled c Tylenol when not pregnant, but more painful headaches  during preg. due to hypertension.  . Hidradenitis suppurativa    . Hodgkin lymphoma, nodular lymphocyte predominance ([place]) 04/15/2016  . Hypertension December 2011   Been on [place] since Dec. 2011  . Sleep apnea     Past Surgical History:  Procedure Laterality Date  . ADENOIDECTOMY     age 47  . BREAST RECONSTRUCTION  December 2001   breast reduction at age 32  . MASS EXCISION Right 04/07/2016   Procedure: EXCISION/BIOPSY SOFT TISSUE MASS RIGHT THIGH;  Surgeon: Aviva Signs, MD;  Location: [facility];  Service: General;  Laterality: Right;  . port a cath placed    . PORTACATH PLACEMENT Left 04/28/2016   Procedure: INSERTION PORT-A-CATH;  Surgeon: Aviva Signs, MD;  Location: [facility];  Service: General;  Laterality: Left;    Family History  Problem Relation Age of Onset  . Hypertension Mother   . Cancer Mother   . Hypertension Father   . Heart disease Maternal Grandmother     Social History   Social History  . Marital status: Married    Spouse name: N/A  . Number of children: N/A  . Years of education: N/A   Social History Main Topics  . Smoking status: Never Smoker  . Smokeless tobacco: Never Used  . Alcohol use No  . Drug use: No  . Sexual activity: Yes    Birth control/ protection: IUD   Other Topics Concern  . None   Social History Narrative  . None     PHYSICAL EXAMINATION  ECOG PERFORMANCE STATUS: 1 - Symptomatic but completely ambulatory  Vitals:   05/13/16 0902  BP: (!) 138/107  Pulse: (!) 102  Resp: 18  Temp: 98 F (36.7 C)    GENERAL:alert, no distress, comfortable, cooperative, obese, smiling and accompanied by husband, Lonnie, and mother. SKIN: skin color, texture, turgor are normal, no rashes or significant lesions HEAD: Normocephalic, No masses, lesions, tenderness or abnormalities EYES: normal, EOMI, Conjunctiva are pink and non-injected EARS: External ears normal OROPHARYNX:lips, buccal mucosa, and tongue normal and mucous membranes are moist  NECK: supple, trachea midline LYMPH:  no palpable  lymphadenopathy, not examined BREAST:not examined LUNGS: clear to auscultation and percussion HEART: regular rate & rhythm, no murmurs, no gallops, S1 normal and S2 normal ABDOMEN:abdomen soft, non-tender and normal bowel sounds BACK: Back symmetric, no curvature. EXTREMITIES:less then 2 second capillary refill, no joint deformities, effusion, or inflammation, no skin discoloration, no cyanosis  NEURO: alert & oriented x 3 with fluent speech, no focal motor/sensory deficits, gait normal PELVIC: Tenderness to palpation of suprapubic area.   LABORATORY DATA: CBC    Component Value Date/Time   WBC 5.1 05/13/2016 0835   RBC 4.46 05/13/2016 0835   HGB 11.9 (L) 05/13/2016 0835   HCT 36.9 05/13/2016 0835   PLT 253 05/13/2016 0835   MCV 82.7 05/13/2016 0835   MCH 26.7 05/13/2016 0835   MCHC 32.2 05/13/2016 0835   RDW 13.5 05/13/2016 0835   LYMPHSABS 1.5 05/13/2016 0835   MONOABS 0.3 05/13/2016 0835   EOSABS 0.4 05/13/2016 0835   BASOSABS 0.0 05/13/2016 0835      Chemistry      Component Value Date/Time   NA 135 05/13/2016 0835   K 3.6 05/13/2016  0835   CL 102 05/13/2016 0835   CO2 30 05/13/2016 0835   BUN 9 05/13/2016 0835   CREATININE 0.58 05/13/2016 0835   CREATININE 0.78 02/16/2013 1721      Component Value Date/Time   CALCIUM 8.7 (L) 05/13/2016 0835   ALKPHOS 48 05/13/2016 0835   AST 12 (L) 05/13/2016 0835   ALT 16 05/13/2016 0835   BILITOT 0.4 05/13/2016 0835        PENDING LABS:   RADIOGRAPHIC STUDIES:  Nm Cardiac Muga Rest  Addendum Date: 04/29/2016   ADDENDUM REPORT: 04/29/2016 16:04 ADDENDUM: Correction in clinical data: Hodgkin's lymphoma Electronically Signed   By: Suzy Bouchard M.D.   On: 04/29/2016 16:04   Result Date: 04/29/2016 CLINICAL DATA:  Breast cancer. Evaluate cardiac function in relation to chemotherapy. EXAM: NUCLEAR MEDICINE CARDIAC BLOOD POOL IMAGING (MUGA) TECHNIQUE: Cardiac multi-gated acquisition was performed at rest following  intravenous injection of Tc-1mlabeled red blood cells. RADIOPHARMACEUTICALS:  Twenty mCi Tc-968mDP in-vitro labeled red blood cells IV COMPARISON:  None. FINDINGS: No  focal wall motion abnormality of the left ventricle. Calculated left ventricular ejection fraction equals 66 % IMPRESSION: Left ventricular ejection fraction equals 66 %. Electronically Signed: By: StSuzy Bouchard.D. On: 04/29/2016 15:51   Ct Abdomen Pelvis W Contrast  Result Date: 05/11/2016 CLINICAL DATA:  Hematuria. Stage IV Hodgkin's lymphoma, nodular lymphocyte-predominant EXAM: CT ABDOMEN AND PELVIS WITH CONTRAST TECHNIQUE: Multidetector CT imaging of the abdomen and pelvis was performed using the standard protocol following bolus administration of intravenous contrast. Oral contrast was also administered. CONTRAST:  10089mSOVUE-300 IOPAMIDOL (ISOVUE-300) INJECTION 61% COMPARISON:  PET-CT April 25, 2016 FINDINGS: Lower chest: Lung bases are clear. Hepatobiliary: No focal liver lesions are evident. There is questionable sludge in the gallbladder. The gallbladder wall does not appear appreciably thickened. There is no biliary duct dilatation. Pancreas: No pancreatic mass or inflammatory focus. Spleen: No splenomegaly.  No splenic lesions are evident. Adrenals/Urinary Tract: Adrenals appear normal bilaterally. Kidneys bilaterally show no evidence of mass or hydronephrosis on either side. There is no renal or ureteral calculus on either side. Urinary bladder is midline. The urinary bladder wall appears thickened along its posterior inferior aspects. No urinary bladder mass evident. Stomach/Bowel: There is no appreciable bowel wall or mesenteric thickening. No bowel obstruction. No free air or portal venous air. Vascular/Lymphatic: There are prominent lymph nodes bilaterally in each external iliac node chain. There is a lymph node in the right external iliac node chain measuring 2.6 x 1.7 cm, slightly smaller than on recent PET study.  There is a lymph node in the left external iliac node chain measuring 1.8 x 0.9 cm, marginally smaller. A second lymph node in this area measures 1.9 x 1.3 cm, similar to recent study. There is mild adenopathy in the inguinal regions bilaterally. There is incomplete visualization of a known significantly enlarged lymph node in the right inguinal region anteriorly. Only a small portion of this lymph node is seen. Other lymph nodes in the inguinal regions appear essentially stable. These areas of adenopathy showed abnormal uptake on recent PET study. Elsewhere, there are scattered subcentimeter mesenteric lymph nodes which appear stable. No new areas of adenopathy are appreciable. There is no apparent vascular lesion.  No abdominal aortic aneurysm. Reproductive: Uterus is anteverted. Intrauterine device is positioned within the uterus. There is no pelvic mass or pelvic fluid collection beyond the adenopathy noted in the pelvis. Other: Appendix appears normal. No ascites or abscess evident in the abdomen  or pelvis. There is a small ventral hernia containing only fat. Musculoskeletal: No blastic or lytic bone lesions are evident by CT. No intramuscular or abdominal wall lesion is evident. IMPRESSION: Urinary bladder wall thickening along the posterior and inferior aspects of the bladder. Question a degree of cystitis. No renal or ureteral calculus. No hydronephrosis. Pelvic and inguinal adenopathy, also noted on recent PET study with abnormal radiotracer uptake in these areas. There is only partial visualization of a known significantly enlarged right inguinal lymph node currently. There are subcentimeter mesenteric lymph nodes, stable and of uncertain significance. Intrauterine device positioned within the uterus. No bowel obstruction. No abscess. Appendix appears normal. No focal liver lesions are evident. There is questionable sludge in the gallbladder without gallbladder wall thickening. Electronically Signed   By:  Lowella Grip III M.D.   On: 05/11/2016 14:21   Nm Pet Image Initial (pi) Skull Base To Thigh  Result Date: 04/25/2016 CLINICAL DATA:  Initial treatment strategy for Hodgkin's lymphoma of the right inguinal region. EXAM: NUCLEAR MEDICINE PET SKULL BASE TO THIGH TECHNIQUE: 13.5 mCi F-18 FDG was injected intravenously. Full-ring PET imaging was performed from the skull base to thigh after the radiotracer. CT data was obtained and used for attenuation correction and anatomic localization. FASTING BLOOD GLUCOSE:  Value: 89 mg/dl COMPARISON:  03/26/2016 CT abdomen/ pelvis. FINDINGS: NECK No hypermetabolic lymph nodes in the neck. There is symmetric intense hypermetabolism in Waldeyer's ring with associated soft tissue thickening on the CT images (max SUV 18.5). CHEST There are relatively symmetric enlarged hypermetabolic axillary lymph nodes bilaterally, with representative axillary nodes as follows: - right axillary 1.5 cm node with max SUV 6.7 (series 4/ image 50) - left axillary 1.3 cm node with max SUV 7.2 (series 4/ image 55) There are scattered cutaneous foci of hypermetabolism in the bilateral axilla and bilateral lower chest wall with associated skin thickening as follows: - right axillary skin thickening with max SUV 9.1 (series 4/ image 44) - left axillary skin thickening with max SUV 5.8 (series 4/ image 55) - right lower breast skin thickening with max SUV 15.6 (series 4/image 80) - medial ventral lower left chest wall skin thickening with max SUV 10.5 (series 4/ image 81) No hypermetabolic mediastinal or hilar lymph nodes. No pleural effusions. No acute consolidative airspace disease, lung masses or significant pulmonary nodules. ABDOMEN/PELVIS There are enlarged hypermetabolic bilateral external iliac and bilateral inguinal lymph nodes, with representative nodes as follows: - right external iliac 2.3 cm node with max SUV 13.9 (series 4/image 169) - left external iliac 1.4 cm node with max SUV 6.3  (series 4/image 163) - right inguinal 1.5 cm node with max SUV 13.4 (series 4/image 185) - left inguinal 1.3 cm node with max SUV 7.4 (series 4/image 190) There is a 6.4 x 4.2 cm cystic focus in the right inguinal region with overlying fat stranding and peripheral hypermetabolism (series 4/ image 187), which is nonspecific, favor a seroma with post biopsy change, less likely a necrotic node. No abnormal hypermetabolic activity within the liver, pancreas, adrenal glands, or spleen. No hypermetabolic lymph nodes in the abdomen. Intrauterine device appears obliquely oriented within the uterus. SKELETON There is diffuse hypermetabolism throughout the axial skeleton, for example max SUV 7.6 in the L3 vertebral body. No discrete bone lesions on the CT images. IMPRESSION: 1. Hypermetabolic enlarged bilateral axillary, bilateral external iliac and bilateral inguinal lymph nodes consistent with lymphoma. 2. Hypermetabolic foci of skin thickening in the bilateral axilla and bilateral lower  ventral chest wall, nonspecific, cannot exclude cutaneous lymphoma. 3. Hypermetabolism throughout Waldeyer's ring with associated mucosal thickening on the CT images, which could be inflammatory or due to lymphoma. 4. No splenic enlargement or hypermetabolism. 5. Diffuse hypermetabolism throughout the axial skeleton without discrete bone lesions on the CT images, worrisome for diffuse osseous involvement by lymphoma. 6. Intrauterine device appears obliquely oriented within the uterus, which is nonspecific. Proper position of the IUD cannot be definitively assessed by CT. Consider correlation with transabdominal and transvaginal pelvic ultrasound. Electronically Signed   By: Ilona Sorrel M.D.   On: 04/25/2016 12:56   Dg Chest Port 1 View  Result Date: 04/28/2016 CLINICAL DATA:  Post left for placement EXAM: PORTABLE CHEST 1 VIEW COMPARISON:  04/04/2016 FINDINGS: Left subclavian Port-A-Cath has been placed with the tip at the cavoatrial  junction. No pneumothorax. Low lung volumes. Mild cardiomegaly and vascular congestion. No confluent opacities or effusions. IMPRESSION: Left Port-A-Cath tip at the cavoatrial junction.  No pneumothorax. Electronically Signed   By: Rolm Baptise M.D.   On: 04/28/2016 10:07   Dg C-arm 1-60 Min-no Report  Result Date: 04/28/2016 CLINICAL DATA: port a cath insertion C-ARM 1-60 MINUTES Fluoroscopy was utilized by the requesting physician.  No radiographic interpretation.     PATHOLOGY:    ASSESSMENT AND PLAN:  Hodgkin lymphoma, nodular lymphocyte predominance (HCC) Stage IV nodular lymphocyte predominant hodgkin lymphoma. Her case has been discussed with [facility] and based upon guidelines, reading, and [facility] consultation R[facility] is being recommended. She started R-CHOP with neulasta support on 05/05/2016.  Oncology history is updated.  Labs today: CBC diff, CMET, uric acid.  I personally reviewed and went over laboratory results with the patient.  The results are noted within this dictation.  Labs are very good for her NADIR check.  She tolerated her first treatment reasonably well with constipation and one episode of nausea with vomiting.  This could be related to constipation as well based upon history provided by the patient.  Constipation sheet is provided.  BMs back to normal now.  ED visit is noted with a change in antibiotic regimen for suspected UTI.  She continues with urinary pain, pelvic pain, and hematuria.  I will order another UA and C+S today.  If positive for blood, will call urology.  Risk of Cytoxan-induced hemorrhagic cystitis is low given dose and length of treatment at this time.  CT imaging from ED not only demonstrated a response to therapy, but there is also some thickened bladder wall suspicious for cystitis.  I have reviewed the NCCN guidelines.  Restaging images are performed following completion of systemic chemotherapy +/- ISRT.  "Reevaluation with PET should be done for all  patients after completion of initial therapy.  Observation is recommended for all asymptomatic patients with a clinical response. ISRT is recommended if not received previously. Biopsy is recommended for patients with a stable or progressive disease. Asymptomatic patients with a negative biopsy can be observed and those with a positive biopsy should be managed as described for relapsed or refractory disease."  Return in 2 weeks for follow-up and cycle #2 of treatment.  She will call in the interim with any questions or concerns.  Addendum: Urine sample ascertained.  Not enough for culture and sensitivity testing.  UA with reflex ordered.  Gross hematuria noted on exam of urine specimen.  UA confirms.  Discussed with Estill Bamberg, nurse at [facility], who was able to get the patient an appointment on 05/21/2016 at 10 AM with  patient expected arrival time at 9:30 AM.   ORDERS PLACED FOR THIS ENCOUNTER: Orders Placed This Encounter  Procedures  . Urinalysis, Routine w reflex microscopic  . Urine microscopic-add on    MEDICATIONS PRESCRIBED THIS ENCOUNTER: No orders of the defined types were placed in this encounter.   THERAPY PLAN:  Continue with R-CHOP as planned.  According to NCCN guidelines, restaging/response evaluation is performed following completion of systemic chemotherapy +/- ISRT.  All questions were answered. The patient knows to call the clinic with any problems, questions or concerns. We can certainly see the patient much sooner if necessary.  Patient and plan discussed with Dr. Ancil Linsey and she is in agreement with the aforementioned.   This note is electronically signed by: Doy Mince 05/13/2016 5:56 PM

## 2016-05-13 NOTE — Patient Instructions (Signed)
[place] at [facility] Discharge Instructions  RECOMMENDATIONS MADE BY THE CONSULTANT AND ANY TEST RESULTS WILL BE SENT TO YOUR REFERRING PHYSICIAN.  You were seen by [name] PA-C today. Return in 2 weeks for follow up and cycle #2. Urine sent to lab today. Continue taking Keflex.   Thank you for choosing [place] at [facility] to provide your oncology and hematology care.  To afford each patient quality time with our provider, please arrive at least 15 minutes before your scheduled appointment time.   Beginning January 23rd 2017 lab work for the [facility] will be done in the  Main lab at [facility] on 1st floor. If you have a lab appointment with the [place] please come in thru the  Main Entrance and check in at the main information desk  You need to re-schedule your appointment should you arrive 10 or more minutes late.  We strive to give you quality time with our providers, and arriving late affects you and other patients whose appointments are after yours.  Also, if you no show three or more times for appointments you may be dismissed from the clinic at the providers discretion.     Again, thank you for choosing [facility].  Our hope is that these requests will decrease the amount of time that you wait before being seen by our physicians.       _____________________________________________________________  Should you have questions after your visit to [facility], please contact our office at [phone] between the hours of 8:30 a.m. and 4:30 p.m.  Voicemails left after 4:30 p.m. will not be returned until the following business day.  For prescription refill requests, have your pharmacy contact our office.         Resources For Cancer Patients and their Caregivers ? American Cancer Society: Can assist with transportation, wigs, general needs, runs Look Good Feel Better.         803 776 9579 ? Cancer Care: Provides financial assistance, online support groups, medication/co-pay assistance.  1-800-813-HOPE (501)364-2621) ? [facility] Assists [place] cancer patients and their families through emotional , educational and financial support.  (334) 574-3303 ? [place] DSS Where to apply for food stamps, Medicaid and utility assistance. (617) 053-7886 ? [facility]: Transportation to medical appointments. 364-452-2926 ? Social Security Administration: May apply for disability if have a Stage IV cancer. 5416552370 406 588 3773 ? [employer], Disability and Transit Services: Assists with nutrition, care and transit needs. [place] Support Programs: @10RELATIVEDAYS @ > Cancer Support Group  2nd Tuesday of the month 1pm-2pm, Journey Room  > Creative Journey  3rd Tuesday of the month 1130am-1pm, Journey Room  > Look Good Feel Better  1st Wednesday of the month 10am-12 noon, Journey Room (Call [place] to register [phone])

## 2016-05-20 ENCOUNTER — Other Ambulatory Visit (HOSPITAL_COMMUNITY): Payer: Self-pay | Admitting: Emergency Medicine

## 2016-05-20 MED ORDER — HYDROCODONE-ACETAMINOPHEN 5-325 MG PO TABS: 1.0000 | ORAL_TABLET | ORAL | 0 refills | Status: DC | PRN

## 2016-05-20 NOTE — Progress Notes (Signed)
Hydrocodone refilled.  

## 2016-05-21 ENCOUNTER — Other Ambulatory Visit (HOSPITAL_COMMUNITY)
Admission: RE | Admit: 2016-05-21 | Discharge: 2016-05-21 | Disposition: A | Discharge status: Home / Self Care | Payer: 59 | Source: Ambulatory Visit | Attending: Urology | Admitting: Urology

## 2016-05-21 ENCOUNTER — Ambulatory Visit (INDEPENDENT_AMBULATORY_CARE_PROVIDER_SITE_OTHER): Payer: 59 | Admitting: Urology

## 2016-05-21 DIAGNOSIS — R310 Gross hematuria: Secondary | ICD-10-CM

## 2016-05-22 ENCOUNTER — Encounter (HOSPITAL_COMMUNITY): Payer: 59 | Attending: Hematology & Oncology | Admitting: Oncology

## 2016-05-22 ENCOUNTER — Encounter (HOSPITAL_COMMUNITY): Payer: Self-pay | Admitting: Oncology

## 2016-05-22 DIAGNOSIS — C8100 Nodular lymphocyte predominant Hodgkin lymphoma, unspecified site: Secondary | ICD-10-CM

## 2016-05-22 DIAGNOSIS — R102 Pelvic and perineal pain: Secondary | ICD-10-CM

## 2016-05-22 DIAGNOSIS — C8105 Nodular lymphocyte predominant Hodgkin lymphoma, lymph nodes of inguinal region and lower limb: Secondary | ICD-10-CM | POA: Insufficient documentation

## 2016-05-22 DIAGNOSIS — Z79899 Other long term (current) drug therapy: Secondary | ICD-10-CM | POA: Insufficient documentation

## 2016-05-22 DIAGNOSIS — R319 Hematuria, unspecified: Secondary | ICD-10-CM | POA: Diagnosis not present

## 2016-05-22 DIAGNOSIS — R309 Painful micturition, unspecified: Secondary | ICD-10-CM | POA: Insufficient documentation

## 2016-05-22 MED ORDER — HYDROCODONE-ACETAMINOPHEN 5-325 MG PO TABS: 1.0000 | ORAL_TABLET | ORAL | 0 refills | Status: DC | PRN

## 2016-05-22 NOTE — Assessment & Plan Note (Signed)
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 Her case has been discussed with [facility] and based upon guidelines, reading, and [facility] consultation RCHOP is being recommended. She started R-CHOP with neulasta support on 05/05/2016.  Oncology history is updated.  No labs today.  Labs are scheduled pre-treatment next week.  She tolerated her first treatment well.  She notes that she feels good today.  She did see Dr. Alyson Ingles with Urology yesterday.  I do not have his note for review, but the patient reports that there is a plan to perform a cystoscopy on 06/25/2016.  This day will not be good timing in relation to her chemotherapy dates.  Given her curative intent of treatment, we will not be able to adjust her treatment dates for this procedure.  06/25/2016 with be at or near her NADIR point.  As a result, I have placed a call to Dr. Alyson Ingles to see how we can better coordinate this procedure in relation to her treatment plan.  He is operating this afternoon.  I have left a message and my pager number for his to call me back tomorrow.  Tomorrow, I am on PAL, but will be on the look-out for his call.  Pager: [phone].  I have reviewed the NCCN guidelines.  Restaging images are performed following completion of systemic chemotherapy +/- ISRT.  "Reevaluation with PET should be done for all patients after completion of initial therapy.  Observation is recommended for all asymptomatic patients with a clinical response. ISRT is recommended if not received previously. Biopsy is recommended for patients with a stable or progressive disease. Asymptomatic patients with a negative biopsy can be observed and those with a positive biopsy should be managed as described for relapsed or refractory disease."  Return as scheduled for treatment next week with labs.  Return in ~ 3 weeks for follow-up appointment and next treatment.

## 2016-05-22 NOTE — Progress Notes (Signed)
Pcp Not In System No address on file  Nodular lymphocyte predominant Hodgkin lymphoma, unspecified body region [facility])  CURRENT THERAPY: R-CHOP with Neulasta support beginning on 05/05/2016.  INTERVAL HISTORY: [name] 30 y.o. 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 Her case has been discussed with [facility] and based upon guidelines, reading, and [facility] consultation R[facility] is being recommended.     Hodgkin lymphoma, nodular lymphocyte predominance ([place])   04/07/2016 Procedure    Excisional lymph node biopsy of right thigh      04/11/2016 Pathology Results    Nodular lymphocyte predominant hodgkin Lymphoma      04/23/2016 Miscellaneous    Normal ESR, LDH      04/25/2016 PET scan    1. Hypermetabolic enlarged bilateral axillary, bilateral external iliac and bilateral inguinal lymph nodes consistent with lymphoma. 2. Hypermetabolic foci of skin thickening in the bilateral axilla and bilateral lower ventral chest wall, nonspecific, cannot exclude cutaneous lymphoma. 3. Hypermetabolism throughout Waldeyer's ring with associated mucosal thickening on the CT images, which could be inflammatory or due to lymphoma. 4. No splenic enlargement or hypermetabolism. 5. Diffuse hypermetabolism throughout the axial skeleton without discrete bone lesions on the CT images, worrisome for diffuse osseous involvement by lymphoma.      04/29/2016 Imaging    MUGA- Left ventricular ejection fraction equals 66 %.      05/05/2016 -  Chemotherapy    The patient had DOXOrubicin (ADRIAMYCIN) chemo injection 116 mg, 50 mg/m2 = 116 mg, Intravenous,  Once, 1 of 6 cycles  palonosetron (ALOXI) injection 0.25 mg, 0.25 mg, Intravenous,  Once, 1 of 6 cycles  pegfilgrastim (NEULASTA ONPRO KIT) injection 6 mg, 6 mg, Subcutaneous, Once, 1 of 6 cycles  vinCRIStine (ONCOVIN) 2 mg in sodium chloride 0.9 % 50 mL chemo infusion, 2 mg, Intravenous,  Once, 1 of 6  cycles  riTUXimab (RITUXAN) 900 mg in sodium chloride 0.9 % 250 mL (2.6471 mg/mL) chemo infusion, 375 mg/m2 = 900 mg, Intravenous,  Once, 1 of 6 cycles  cyclophosphamide (CYTOXAN) 1,740 mg in sodium chloride 0.9 % 250 mL chemo infusion, 750 mg/m2 = 1,740 mg, Intravenous,  Once, 1 of 6 cycles  for chemotherapy treatment.         She reports that she feels great!  She did lose her hair and we had a discussion regarding this.  She did shave her head recently.  She notes that her hair loss was an emotional event, particularly since her young daughter saw her lose her hair and was asking "mommy, why are you crying?"  Her husband was unaware, until today, that she cried during this event.  He works third shift.   She saw Urology yesterday for her pelvic pain with urination and gross hematuria.  There are plans for cystoscopy in the future.  Review of Systems  Constitutional: Negative.  Negative for chills, fever and weight loss.  HENT: Negative.   Eyes: Negative.   Respiratory: Negative.  Negative for cough.   Cardiovascular: Negative.  Negative for chest pain.  Gastrointestinal: Negative.  Negative for constipation, diarrhea, nausea and vomiting.  Genitourinary: Positive for dysuria and hematuria.  Musculoskeletal: Negative.   Skin: Negative.  Negative for rash.  Neurological: Negative.  Negative for weakness.  Endo/Heme/Allergies: Negative.   Psychiatric/Behavioral: Negative.     Past Medical History:  Diagnosis Date  . Asthma    had since childhood; has not had to use inhaler or  nebulizer for 8-9 years. Been doing well.  Marland Kitchen Headache(784.0)    usually controlled c Tylenol when not pregnant, but more painful headaches  during preg. due to hypertension.  . Hidradenitis suppurativa   . Hodgkin lymphoma, nodular lymphocyte predominance ([place]) 04/15/2016  . Hypertension December 2011   Been on [place] since Dec. 2011  . Sleep apnea     Past Surgical History:  Procedure Laterality Date   . ADENOIDECTOMY     age 79  . BREAST RECONSTRUCTION  December 2001   breast reduction at age 58  . MASS EXCISION Right 04/07/2016   Procedure: EXCISION/BIOPSY SOFT TISSUE MASS RIGHT THIGH;  Surgeon: Aviva Signs, MD;  Location: [facility];  Service: General;  Laterality: Right;  . port a cath placed    . PORTACATH PLACEMENT Left 04/28/2016   Procedure: INSERTION PORT-A-CATH;  Surgeon: Aviva Signs, MD;  Location: [facility];  Service: General;  Laterality: Left;    Family History  Problem Relation Age of Onset  . Hypertension Mother   . Cancer Mother   . Hypertension Father   . Heart disease Maternal Grandmother     Social History   Social History  . Marital status: Married    Spouse name: N/A  . Number of children: N/A  . Years of education: N/A   Social History Main Topics  . Smoking status: Never Smoker  . Smokeless tobacco: Never Used  . Alcohol use No  . Drug use: No  . Sexual activity: Yes    Birth control/ protection: IUD   Other Topics Concern  . None   Social History Narrative  . None     PHYSICAL EXAMINATION  ECOG PERFORMANCE STATUS: 0 - Asymptomatic  Vitals:   05/22/16 1530  BP: (!) 128/94  Pulse: (!) 103  Resp: 18  Temp: 98.4 F (36.9 C)    GENERAL:alert, well nourished, well developed, comfortable, cooperative, obese, smiling and accompanied by husband SKIN: skin color, texture, turgor are normal, no rashes or significant lesions HEAD: Normocephalic, No masses, lesions, tenderness or abnormalities EYES: normal, EOMI, Conjunctiva are pink and non-injected EARS: External ears normal OROPHARYNX:lips, buccal mucosa, and tongue normal and mucous membranes are moist  NECK: supple, no adenopathy, trachea midline LYMPH:  no palpable lymphadenopathy BREAST:not examined LUNGS: clear to auscultation and percussion HEART: regular rate & rhythm, no murmurs, no gallops, S1 normal and S2 normal ABDOMEN:abdomen soft, obese and normal bowel sounds BACK: Back  symmetric, no curvature. EXTREMITIES:less then 2 second capillary refill, no joint deformities, effusion, or inflammation, no skin discoloration, no cyanosis  NEURO: alert & oriented x 3 with fluent speech, no focal motor/sensory deficits, gait normal   LABORATORY DATA: CBC    Component Value Date/Time   WBC 5.1 05/13/2016 0835   RBC 4.46 05/13/2016 0835   HGB 11.9 (L) 05/13/2016 0835   HCT 36.9 05/13/2016 0835   PLT 253 05/13/2016 0835   MCV 82.7 05/13/2016 0835   MCH 26.7 05/13/2016 0835   MCHC 32.2 05/13/2016 0835   RDW 13.5 05/13/2016 0835   LYMPHSABS 1.5 05/13/2016 0835   MONOABS 0.3 05/13/2016 0835   EOSABS 0.4 05/13/2016 0835   BASOSABS 0.0 05/13/2016 0835      Chemistry      Component Value Date/Time   NA 135 05/13/2016 0835   K 3.6 05/13/2016 0835   CL 102 05/13/2016 0835   CO2 30 05/13/2016 0835   BUN 9 05/13/2016 0835   CREATININE 0.58 05/13/2016 0835   CREATININE 0.78  02/16/2013 1721      Component Value Date/Time   CALCIUM 8.7 (L) 05/13/2016 0835   ALKPHOS 48 05/13/2016 0835   AST 12 (L) 05/13/2016 0835   ALT 16 05/13/2016 0835   BILITOT 0.4 05/13/2016 0835        PENDING LABS:   RADIOGRAPHIC STUDIES:  Nm Cardiac Muga Rest  Addendum Date: 04/29/2016   ADDENDUM REPORT: 04/29/2016 16:04 ADDENDUM: Correction in clinical data: Hodgkin's lymphoma Electronically Signed   By: Suzy Bouchard M.D.   On: 04/29/2016 16:04   Result Date: 04/29/2016 CLINICAL DATA:  Breast cancer. Evaluate cardiac function in relation to chemotherapy. EXAM: NUCLEAR MEDICINE CARDIAC BLOOD POOL IMAGING (MUGA) TECHNIQUE: Cardiac multi-gated acquisition was performed at rest following intravenous injection of Tc-48mlabeled red blood cells. RADIOPHARMACEUTICALS:  Twenty mCi Tc-937mDP in-vitro labeled red blood cells IV COMPARISON:  None. FINDINGS: No  focal wall motion abnormality of the left ventricle. Calculated left ventricular ejection fraction equals 66 % IMPRESSION: Left  ventricular ejection fraction equals 66 %. Electronically Signed: By: StSuzy Bouchard.D. On: 04/29/2016 15:51   Ct Abdomen Pelvis W Contrast  Result Date: 05/11/2016 CLINICAL DATA:  Hematuria. Stage IV Hodgkin's lymphoma, nodular lymphocyte-predominant EXAM: CT ABDOMEN AND PELVIS WITH CONTRAST TECHNIQUE: Multidetector CT imaging of the abdomen and pelvis was performed using the standard protocol following bolus administration of intravenous contrast. Oral contrast was also administered. CONTRAST:  10060mSOVUE-300 IOPAMIDOL (ISOVUE-300) INJECTION 61% COMPARISON:  PET-CT April 25, 2016 FINDINGS: Lower chest: Lung bases are clear. Hepatobiliary: No focal liver lesions are evident. There is questionable sludge in the gallbladder. The gallbladder wall does not appear appreciably thickened. There is no biliary duct dilatation. Pancreas: No pancreatic mass or inflammatory focus. Spleen: No splenomegaly.  No splenic lesions are evident. Adrenals/Urinary Tract: Adrenals appear normal bilaterally. Kidneys bilaterally show no evidence of mass or hydronephrosis on either side. There is no renal or ureteral calculus on either side. Urinary bladder is midline. The urinary bladder wall appears thickened along its posterior inferior aspects. No urinary bladder mass evident. Stomach/Bowel: There is no appreciable bowel wall or mesenteric thickening. No bowel obstruction. No free air or portal venous air. Vascular/Lymphatic: There are prominent lymph nodes bilaterally in each external iliac node chain. There is a lymph node in the right external iliac node chain measuring 2.6 x 1.7 cm, slightly smaller than on recent PET study. There is a lymph node in the left external iliac node chain measuring 1.8 x 0.9 cm, marginally smaller. A second lymph node in this area measures 1.9 x 1.3 cm, similar to recent study. There is mild adenopathy in the inguinal regions bilaterally. There is incomplete visualization of a known  significantly enlarged lymph node in the right inguinal region anteriorly. Only a small portion of this lymph node is seen. Other lymph nodes in the inguinal regions appear essentially stable. These areas of adenopathy showed abnormal uptake on recent PET study. Elsewhere, there are scattered subcentimeter mesenteric lymph nodes which appear stable. No new areas of adenopathy are appreciable. There is no apparent vascular lesion.  No abdominal aortic aneurysm. Reproductive: Uterus is anteverted. Intrauterine device is positioned within the uterus. There is no pelvic mass or pelvic fluid collection beyond the adenopathy noted in the pelvis. Other: Appendix appears normal. No ascites or abscess evident in the abdomen or pelvis. There is a small ventral hernia containing only fat. Musculoskeletal: No blastic or lytic bone lesions are evident by CT. No intramuscular or abdominal wall lesion is  evident. IMPRESSION: Urinary bladder wall thickening along the posterior and inferior aspects of the bladder. Question a degree of cystitis. No renal or ureteral calculus. No hydronephrosis. Pelvic and inguinal adenopathy, also noted on recent PET study with abnormal radiotracer uptake in these areas. There is only partial visualization of a known significantly enlarged right inguinal lymph node currently. There are subcentimeter mesenteric lymph nodes, stable and of uncertain significance. Intrauterine device positioned within the uterus. No bowel obstruction. No abscess. Appendix appears normal. No focal liver lesions are evident. There is questionable sludge in the gallbladder without gallbladder wall thickening. Electronically Signed   By: Lowella Grip III M.D.   On: 05/11/2016 14:21   Nm Pet Image Initial (pi) Skull Base To Thigh  Result Date: 04/25/2016 CLINICAL DATA:  Initial treatment strategy for Hodgkin's lymphoma of the right inguinal region. EXAM: NUCLEAR MEDICINE PET SKULL BASE TO THIGH TECHNIQUE: 13.5 mCi  F-18 FDG was injected intravenously. Full-ring PET imaging was performed from the skull base to thigh after the radiotracer. CT data was obtained and used for attenuation correction and anatomic localization. FASTING BLOOD GLUCOSE:  Value: 89 mg/dl COMPARISON:  03/26/2016 CT abdomen/ pelvis. FINDINGS: NECK No hypermetabolic lymph nodes in the neck. There is symmetric intense hypermetabolism in Waldeyer's ring with associated soft tissue thickening on the CT images (max SUV 18.5). CHEST There are relatively symmetric enlarged hypermetabolic axillary lymph nodes bilaterally, with representative axillary nodes as follows: - right axillary 1.5 cm node with max SUV 6.7 (series 4/ image 50) - left axillary 1.3 cm node with max SUV 7.2 (series 4/ image 55) There are scattered cutaneous foci of hypermetabolism in the bilateral axilla and bilateral lower chest wall with associated skin thickening as follows: - right axillary skin thickening with max SUV 9.1 (series 4/ image 44) - left axillary skin thickening with max SUV 5.8 (series 4/ image 55) - right lower breast skin thickening with max SUV 15.6 (series 4/image 80) - medial ventral lower left chest wall skin thickening with max SUV 10.5 (series 4/ image 81) No hypermetabolic mediastinal or hilar lymph nodes. No pleural effusions. No acute consolidative airspace disease, lung masses or significant pulmonary nodules. ABDOMEN/PELVIS There are enlarged hypermetabolic bilateral external iliac and bilateral inguinal lymph nodes, with representative nodes as follows: - right external iliac 2.3 cm node with max SUV 13.9 (series 4/image 169) - left external iliac 1.4 cm node with max SUV 6.3 (series 4/image 163) - right inguinal 1.5 cm node with max SUV 13.4 (series 4/image 185) - left inguinal 1.3 cm node with max SUV 7.4 (series 4/image 190) There is a 6.4 x 4.2 cm cystic focus in the right inguinal region with overlying fat stranding and peripheral hypermetabolism (series 4/  image 187), which is nonspecific, favor a seroma with post biopsy change, less likely a necrotic node. No abnormal hypermetabolic activity within the liver, pancreas, adrenal glands, or spleen. No hypermetabolic lymph nodes in the abdomen. Intrauterine device appears obliquely oriented within the uterus. SKELETON There is diffuse hypermetabolism throughout the axial skeleton, for example max SUV 7.6 in the L3 vertebral body. No discrete bone lesions on the CT images. IMPRESSION: 1. Hypermetabolic enlarged bilateral axillary, bilateral external iliac and bilateral inguinal lymph nodes consistent with lymphoma. 2. Hypermetabolic foci of skin thickening in the bilateral axilla and bilateral lower ventral chest wall, nonspecific, cannot exclude cutaneous lymphoma. 3. Hypermetabolism throughout Waldeyer's ring with associated mucosal thickening on the CT images, which could be inflammatory or due to lymphoma.  4. No splenic enlargement or hypermetabolism. 5. Diffuse hypermetabolism throughout the axial skeleton without discrete bone lesions on the CT images, worrisome for diffuse osseous involvement by lymphoma. 6. Intrauterine device appears obliquely oriented within the uterus, which is nonspecific. Proper position of the IUD cannot be definitively assessed by CT. Consider correlation with transabdominal and transvaginal pelvic ultrasound. Electronically Signed   By: Ilona Sorrel M.D.   On: 04/25/2016 12:56   Dg Chest Port 1 View  Result Date: 04/28/2016 CLINICAL DATA:  Post left for placement EXAM: PORTABLE CHEST 1 VIEW COMPARISON:  04/04/2016 FINDINGS: Left subclavian Port-A-Cath has been placed with the tip at the cavoatrial junction. No pneumothorax. Low lung volumes. Mild cardiomegaly and vascular congestion. No confluent opacities or effusions. IMPRESSION: Left Port-A-Cath tip at the cavoatrial junction.  No pneumothorax. Electronically Signed   By: Rolm Baptise M.D.   On: 04/28/2016 10:07   Dg C-arm 1-60  Min-no Report  Result Date: 04/28/2016 CLINICAL DATA: port a cath insertion C-ARM 1-60 MINUTES Fluoroscopy was utilized by the requesting physician.  No radiographic interpretation.     PATHOLOGY:    ASSESSMENT AND PLAN:  Hodgkin lymphoma, nodular lymphocyte predominance (HCC) Stage IV nodular lymphocyte predominant hodgkin lymphoma. Her case has been discussed with [facility] and based upon guidelines, reading, and [facility] consultation [facility] is being recommended. She started R-CHOP with neulasta support on 05/05/2016.  Oncology history is updated.  No labs today.  Labs are scheduled pre-treatment next week.  She tolerated her first treatment well.  She notes that she feels good today.  She did see Dr. Alyson Ingles with Urology yesterday.  I do not have his note for review, but the patient reports that there is a plan to perform a cystoscopy on 06/25/2016.  This day will not be good timing in relation to her chemotherapy dates.  Given her curative intent of treatment, we will not be able to adjust her treatment dates for this procedure.  06/25/2016 with be at or near her NADIR point.  As a result, I have placed a call to Dr. Alyson Ingles to see how we can better coordinate this procedure in relation to her treatment plan.  He is operating this afternoon.  I have left a message and my pager number for his to call me back tomorrow.  Tomorrow, I am on PAL, but will be on the look-out for his call.  Pager: [phone].  I have reviewed the NCCN guidelines.  Restaging images are performed following completion of systemic chemotherapy +/- ISRT.  "Reevaluation with PET should be done for all patients after completion of initial therapy.  Observation is recommended for all asymptomatic patients with a clinical response. ISRT is recommended if not received previously. Biopsy is recommended for patients with a stable or progressive disease. Asymptomatic patients with a negative biopsy can be observed and those with a positive  biopsy should be managed as described for relapsed or refractory disease."  Return as scheduled for treatment next week with labs.  Return in ~ 3 weeks for follow-up appointment and next treatment.   ORDERS PLACED FOR THIS ENCOUNTER: No orders of the defined types were placed in this encounter.   MEDICATIONS PRESCRIBED THIS ENCOUNTER: Meds ordered this encounter  Medications  . HYDROcodone-acetaminophen (NORCO) 5-325 MG tablet    Sig: Take 1-2 tablets by mouth every 4 (four) hours as needed for moderate pain.    Dispense:  45 tablet    Refill:  0    Order Specific Question:  Supervising Provider    Answer:   Patrici Ranks [[id number]]    THERAPY PLAN:  Continue treatment as planned with curative intent.  All questions were answered. The patient knows to call the clinic with any problems, questions or concerns. We can certainly see the patient much sooner if necessary.  Patient and plan discussed with Dr. Ancil Linsey and she is in agreement with the aforementioned.   This note is electronically signed by: Doy Mince 05/22/2016 6:16 PM

## 2016-05-22 NOTE — Patient Instructions (Signed)
[place] at [facility] Discharge Instructions  RECOMMENDATIONS MADE BY THE CONSULTANT AND ANY TEST RESULTS WILL BE SENT TO YOUR REFERRING PHYSICIAN.  You were seen today by [name] PA-C. Refill of pain medication given. Return Monday for cycle 2 and as scheduled after that.    Thank you for choosing [facility] at [facility] to provide your oncology and hematology care.  To afford each patient quality time with our provider, please arrive at least 15 minutes before your scheduled appointment time.   Beginning January 23rd 2017 lab work for the [facility] will be done in the  Main lab at [facility] on 1st floor. If you have a lab appointment with the [facility] please come in thru the  Main Entrance and check in at the main information desk  You need to re-schedule your appointment should you arrive 10 or more minutes late.  We strive to give you quality time with our providers, and arriving late affects you and other patients whose appointments are after yours.  Also, if you no show three or more times for appointments you may be dismissed from the clinic at the providers discretion.     Again, thank you for choosing [facility].  Our hope is that these requests will decrease the amount of time that you wait before being seen by our physicians.       _____________________________________________________________  Should you have questions after your visit to [facility], please contact our office at [phone] between the hours of 8:30 a.m. and 4:30 p.m.  Voicemails left after 4:30 p.m. will not be returned until the following business day.  For prescription refill requests, have your pharmacy contact our office.         Resources For Cancer Patients and their Caregivers ? American Cancer Society: Can assist with transportation, wigs, general needs, runs Look Good Feel Better.         5158764350 ? Cancer Care: Provides financial assistance, online support groups, medication/co-pay assistance.  1-800-813-HOPE (567)841-9145) ? [place] Assists [place] cancer patients and their families through emotional , educational and financial support.  717-218-2613 ? [place] DSS Where to apply for food stamps, Medicaid and utility assistance. 432-763-5134 ? [facility]: Transportation to medical appointments. 956-551-8646 ? Social Security Administration: May apply for disability if have a Stage IV cancer. (559) 113-4107 308 543 8292 ? [employer], Disability and Transit Services: Assists with nutrition, care and transit needs. [place] Support Programs: @10RELATIVEDAYS @ > Cancer Support Group  2nd Tuesday of the month 1pm-2pm, Journey Room  > Creative Journey  3rd Tuesday of the month 1130am-1pm, Journey Room  > Look Good Feel Better  1st Wednesday of the month 10am-12 noon, Journey Room (Call [place] to register [phone])

## 2016-05-23 LAB — URINE CULTURE: CULTURE: NO GROWTH

## 2016-05-26 ENCOUNTER — Encounter (HOSPITAL_BASED_OUTPATIENT_CLINIC_OR_DEPARTMENT_OTHER): Payer: 59

## 2016-05-26 ENCOUNTER — Encounter (HOSPITAL_COMMUNITY): Payer: Self-pay

## 2016-05-26 VITALS — BP 132/82 | HR 108 | Temp 98.0°F | Resp 18 | Wt 272.4 lb

## 2016-05-26 DIAGNOSIS — Z5112 Encounter for antineoplastic immunotherapy: Secondary | ICD-10-CM

## 2016-05-26 DIAGNOSIS — R309 Painful micturition, unspecified: Secondary | ICD-10-CM | POA: Diagnosis not present

## 2016-05-26 DIAGNOSIS — C8105 Nodular lymphocyte predominant Hodgkin lymphoma, lymph nodes of inguinal region and lower limb: Secondary | ICD-10-CM

## 2016-05-26 DIAGNOSIS — Z5111 Encounter for antineoplastic chemotherapy: Secondary | ICD-10-CM

## 2016-05-26 DIAGNOSIS — Z79899 Other long term (current) drug therapy: Secondary | ICD-10-CM | POA: Diagnosis not present

## 2016-05-26 LAB — CBC WITH DIFFERENTIAL/PLATELET
Basophils Absolute: 0.1 10*3/uL (ref 0.0–0.1)
Basophils Relative: 1 %
Eosinophils Absolute: 0.1 10*3/uL (ref 0.0–0.7)
Eosinophils Relative: 1 %
HEMATOCRIT: 36.7 % (ref 36.0–46.0)
HEMOGLOBIN: 11.7 g/dL — AB (ref 12.0–15.0)
LYMPHS ABS: 0.8 10*3/uL (ref 0.7–4.0)
LYMPHS PCT: 10 %
MCH: 26.4 pg (ref 26.0–34.0)
MCHC: 31.9 g/dL (ref 30.0–36.0)
MCV: 82.8 fL (ref 78.0–100.0)
MONO ABS: 0.7 10*3/uL (ref 0.1–1.0)
MONOS PCT: 9 %
NEUTROS ABS: 6.6 10*3/uL (ref 1.7–7.7)
Neutrophils Relative %: 79 %
Platelets: 482 10*3/uL — ABNORMAL HIGH (ref 150–400)
RBC: 4.43 MIL/uL (ref 3.87–5.11)
RDW: 14.4 % (ref 11.5–15.5)
WBC: 8.3 10*3/uL (ref 4.0–10.5)

## 2016-05-26 LAB — COMPREHENSIVE METABOLIC PANEL
ALK PHOS: 37 U/L — AB (ref 38–126)
ALT: 17 U/L (ref 14–54)
ANION GAP: 4 — AB (ref 5–15)
AST: 16 U/L (ref 15–41)
Albumin: 3.6 g/dL (ref 3.5–5.0)
BILIRUBIN TOTAL: 0.3 mg/dL (ref 0.3–1.2)
BUN: 10 mg/dL (ref 6–20)
CALCIUM: 9 mg/dL (ref 8.9–10.3)
CO2: 26 mmol/L (ref 22–32)
Chloride: 104 mmol/L (ref 101–111)
Creatinine, Ser: 0.58 mg/dL (ref 0.44–1.00)
GFR calc Af Amer: >60 mL/min (ref >60)
Glucose, Bld: 103 mg/dL — ABNORMAL HIGH (ref 65–99)
POTASSIUM: 3.7 mmol/L (ref 3.5–5.1)
Sodium: 134 mmol/L — ABNORMAL LOW (ref 135–145)
TOTAL PROTEIN: 6.9 g/dL (ref 6.5–8.1)

## 2016-05-26 MED ORDER — ACETAMINOPHEN 325 MG PO TABS
650.0000 mg | ORAL_TABLET | Freq: Once | ORAL | Status: AC
  Administered 2016-05-26: 650 mg via ORAL

## 2016-05-26 MED ORDER — ACETAMINOPHEN 325 MG PO TABS
ORAL_TABLET | ORAL | Status: AC
  Filled 2016-05-26: qty 2

## 2016-05-26 MED ORDER — SODIUM CHLORIDE 0.9 % IV SOLN
750.0000 mg/m2 | Freq: Once | INTRAVENOUS | Status: AC
  Administered 2016-05-26: 1740 mg via INTRAVENOUS
  Filled 2016-05-26: qty 87

## 2016-05-26 MED ORDER — VINCRISTINE SULFATE CHEMO INJECTION 1 MG/ML
2.0000 mg | Freq: Once | INTRAVENOUS | Status: AC
  Administered 2016-05-26: 2 mg via INTRAVENOUS
  Filled 2016-05-26: qty 2

## 2016-05-26 MED ORDER — PALONOSETRON HCL INJECTION 0.25 MG/5ML
INTRAVENOUS | Status: AC
  Filled 2016-05-26: qty 5

## 2016-05-26 MED ORDER — SODIUM CHLORIDE 0.9% FLUSH: 10.0000 mL | INTRAVENOUS | Status: DC | PRN

## 2016-05-26 MED ORDER — DEXAMETHASONE SODIUM PHOSPHATE 100 MG/10ML IJ SOLN
10.0000 mg | Freq: Once | INTRAMUSCULAR | Status: AC
  Administered 2016-05-26: 10 mg via INTRAVENOUS
  Filled 2016-05-26: qty 1

## 2016-05-26 MED ORDER — HEPARIN SOD (PORK) LOCK FLUSH 100 UNIT/ML IV SOLN
500.0000 [IU] | Freq: Once | INTRAVENOUS | Status: AC | PRN
  Administered 2016-05-26: 500 [IU]
  Filled 2016-05-26: qty 5

## 2016-05-26 MED ORDER — DIPHENHYDRAMINE HCL 25 MG PO CAPS
ORAL_CAPSULE | ORAL | Status: AC
  Filled 2016-05-26: qty 2

## 2016-05-26 MED ORDER — PEGFILGRASTIM 6 MG/0.6ML ~~LOC~~ PSKT
6.0000 mg | PREFILLED_SYRINGE | Freq: Once | SUBCUTANEOUS | Status: AC
  Administered 2016-05-26: 6 mg via SUBCUTANEOUS
  Filled 2016-05-26: qty 0.6

## 2016-05-26 MED ORDER — SODIUM CHLORIDE 0.9 % IV SOLN
375.0000 mg/m2 | Freq: Once | INTRAVENOUS | Status: AC
  Administered 2016-05-26: 900 mg via INTRAVENOUS
  Filled 2016-05-26: qty 40

## 2016-05-26 MED ORDER — SODIUM CHLORIDE 0.9 % IV SOLN
Freq: Once | INTRAVENOUS | Status: AC
  Administered 2016-05-26: 11:00:00 via INTRAVENOUS

## 2016-05-26 MED ORDER — DIPHENHYDRAMINE HCL 25 MG PO CAPS
50.0000 mg | ORAL_CAPSULE | Freq: Once | ORAL | Status: AC
  Administered 2016-05-26: 50 mg via ORAL

## 2016-05-26 MED ORDER — PALONOSETRON HCL INJECTION 0.25 MG/5ML
0.2500 mg | Freq: Once | INTRAVENOUS | Status: AC
  Administered 2016-05-26: 0.25 mg via INTRAVENOUS

## 2016-05-26 MED ORDER — DOXORUBICIN HCL CHEMO IV INJECTION 2 MG/ML
50.0000 mg/m2 | Freq: Once | INTRAVENOUS | Status: AC
  Administered 2016-05-26: 116 mg via INTRAVENOUS
  Filled 2016-05-26: qty 58

## 2016-05-26 NOTE — Patient Instructions (Signed)
[facility] Discharge Instructions for Patients Receiving Chemotherapy   Beginning January 23rd 2017 lab work for the [facility] will be done in the  Main lab at [facility] on 1st floor. If you have a lab appointment with the [facility] please come in thru the  Main Entrance and check in at the main information desk   Today you received the following chemotherapy agents:  Rituxan, adriamycin, vincristine, and cytoxan  If you develop nausea and vomiting, or diarrhea that is not controlled by your medication, call the clinic.  The clinic phone number is [phone]. Office hours are Monday-Friday 8:30am-5:00pm.  BELOW ARE SYMPTOMS THAT SHOULD BE REPORTED IMMEDIATELY:  *FEVER GREATER THAN 101.0 F  *CHILLS WITH OR WITHOUT FEVER  NAUSEA AND VOMITING THAT IS NOT CONTROLLED WITH YOUR NAUSEA MEDICATION  *UNUSUAL SHORTNESS OF BREATH  *UNUSUAL BRUISING OR BLEEDING  TENDERNESS IN MOUTH AND THROAT WITH OR WITHOUT PRESENCE OF ULCERS  *URINARY PROBLEMS  *BOWEL PROBLEMS  UNUSUAL RASH Items with * indicate a potential emergency and should be followed up as soon as possible. If you have an emergency after office hours please contact your primary care physician or go to the nearest emergency department.  Please call the clinic during office hours if you have any questions or concerns.   You may also contact the Patient Navigator at [phone] should you have any questions or need assistance in obtaining follow up care.      Resources For Cancer Patients and their Caregivers ? American Cancer Society: Can assist with transportation, wigs, general needs, runs Look Good Feel Better.        (279) 243-0732 ? Cancer Care: Provides financial assistance, online support groups, medication/co-pay assistance.  1-800-813-HOPE 813-746-4925) ? [place] Assists [place] cancer patients and their families through emotional , educational and  financial support.  478 828 1882 ? [place] DSS Where to apply for food stamps, Medicaid and utility assistance. 657 430 7504 ? [employer]: Transportation to medical appointments. 269 507 4422 ? Social Security Administration: May apply for disability if have a Stage IV cancer. 970-037-2145 (519) 631-5437 ? [employer], Disability and Transit Services: Assists with nutrition, care and transit needs. 406-195-8041

## 2016-05-27 ENCOUNTER — Encounter (HOSPITAL_BASED_OUTPATIENT_CLINIC_OR_DEPARTMENT_OTHER): Payer: 59

## 2016-05-27 DIAGNOSIS — Z5189 Encounter for other specified aftercare: Secondary | ICD-10-CM

## 2016-05-27 DIAGNOSIS — C8105 Nodular lymphocyte predominant Hodgkin lymphoma, lymph nodes of inguinal region and lower limb: Secondary | ICD-10-CM | POA: Diagnosis not present

## 2016-05-27 MED ORDER — PEGFILGRASTIM INJECTION 6 MG/0.6ML ~~LOC~~
6.0000 mg | PREFILLED_SYRINGE | Freq: Once | SUBCUTANEOUS | Status: AC
  Administered 2016-05-27: 6 mg via SUBCUTANEOUS

## 2016-05-27 MED ORDER — PEGFILGRASTIM INJECTION 6 MG/0.6ML ~~LOC~~
PREFILLED_SYRINGE | SUBCUTANEOUS | Status: AC
  Filled 2016-05-27: qty 0.6

## 2016-05-27 NOTE — Patient Instructions (Addendum)
[place] at [facility] Discharge Instructions  RECOMMENDATIONS MADE BY THE CONSULTANT AND ANY TEST RESULTS WILL BE SENT TO YOUR REFERRING PHYSICIAN.  Neulasta injection today. PLEASE remember to bring the Neulasta OnPro that was applied yesterday with you to your next visit.  Chemotherapy as scheduled. Call the clinic with any questions or concerns.   Thank you for choosing [place] at [facility] to provide your oncology and hematology care.  To afford each patient quality time with our provider, please arrive at least 15 minutes before your scheduled appointment time.   Beginning January 23rd 2017 lab work for the [facility] will be done in the  Main lab at [facility] on 1st floor. If you have a lab appointment with the [facility] please come in thru the  Main Entrance and check in at the main information desk  You need to re-schedule your appointment should you arrive 10 or more minutes late.  We strive to give you quality time with our providers, and arriving late affects you and other patients whose appointments are after yours.  Also, if you no show three or more times for appointments you may be dismissed from the clinic at the providers discretion.     Again, thank you for choosing [facility].  Our hope is that these requests will decrease the amount of time that you wait before being seen by our physicians.       _____________________________________________________________  Should you have questions after your visit to [facility], please contact our office at [phone] between the hours of 8:30 a.m. and 4:30 p.m.  Voicemails left after 4:30 p.m. will not be returned until the following business day.  For prescription refill requests, have your pharmacy contact our office.         Resources For Cancer Patients and their Caregivers ? American Cancer Society: Can assist with transportation,  wigs, general needs, runs Look Good Feel Better.        762-867-9868 ? Cancer Care: Provides financial assistance, online support groups, medication/co-pay assistance.  1-800-813-HOPE 754-534-1806) ? [place] Assists [place] cancer patients and their families through emotional , educational and financial support.  (856)310-7113 ? [place] DSS Where to apply for food stamps, Medicaid and utility assistance. (714)640-2977 ? [employer]: Transportation to medical appointments. 603-265-2194 ? Social Security Administration: May apply for disability if have a Stage IV cancer. 503-278-9733 937-113-9302 ? [employer], Disability and Transit Services: Assists with nutrition, care and transit needs. [place] Support Programs: @10RELATIVEDAYS @ > Cancer Support Group  2nd Tuesday of the month 1pm-2pm, Journey Room  > Creative Journey  3rd Tuesday of the month 1130am-1pm, Journey Room  > Look Good Feel Better  1st Wednesday of the month 10am-12 noon, Journey Room (Call [place] to register [phone])

## 2016-05-27 NOTE — Progress Notes (Signed)
[name] presents today for injection per the provider's orders.  Neulasta administration without incident; see MAR for injection details.  Patient tolerated procedure well and without incident.  No questions or complaints noted at this time.  Pt given Neulasta injection today d/t OnPro was accidentally removed by pt approximately 20 min after application yesterday.

## 2016-06-14 ENCOUNTER — Other Ambulatory Visit (HOSPITAL_COMMUNITY): Payer: Self-pay | Admitting: Hematology & Oncology

## 2016-06-14 DIAGNOSIS — C8105 Nodular lymphocyte predominant Hodgkin lymphoma, lymph nodes of inguinal region and lower limb: Secondary | ICD-10-CM

## 2016-06-16 ENCOUNTER — Ambulatory Visit (HOSPITAL_COMMUNITY)
Admission: RE | Admit: 2016-06-16 | Discharge: 2016-06-16 | Disposition: A | Discharge status: Home / Self Care | Payer: 59 | Source: Ambulatory Visit | Attending: Hematology & Oncology | Admitting: Hematology & Oncology

## 2016-06-16 ENCOUNTER — Encounter (HOSPITAL_COMMUNITY): Payer: Self-pay | Admitting: Hematology & Oncology

## 2016-06-16 ENCOUNTER — Encounter (HOSPITAL_COMMUNITY): Payer: 59

## 2016-06-16 ENCOUNTER — Ambulatory Visit (HOSPITAL_COMMUNITY): Payer: 59

## 2016-06-16 ENCOUNTER — Encounter (HOSPITAL_BASED_OUTPATIENT_CLINIC_OR_DEPARTMENT_OTHER): Payer: 59 | Admitting: Hematology & Oncology

## 2016-06-16 VITALS — BP 140/95 | HR 98 | Temp 98.0°F | Resp 20 | Wt 276.6 lb

## 2016-06-16 DIAGNOSIS — C8105 Nodular lymphocyte predominant Hodgkin lymphoma, lymph nodes of inguinal region and lower limb: Secondary | ICD-10-CM

## 2016-06-16 DIAGNOSIS — G620 Drug-induced polyneuropathy: Secondary | ICD-10-CM | POA: Diagnosis not present

## 2016-06-16 DIAGNOSIS — Z452 Encounter for adjustment and management of vascular access device: Secondary | ICD-10-CM | POA: Diagnosis not present

## 2016-06-16 DIAGNOSIS — C8108 Nodular lymphocyte predominant Hodgkin lymphoma, lymph nodes of multiple sites: Secondary | ICD-10-CM | POA: Insufficient documentation

## 2016-06-16 DIAGNOSIS — Z5111 Encounter for antineoplastic chemotherapy: Secondary | ICD-10-CM

## 2016-06-16 DIAGNOSIS — I10 Essential (primary) hypertension: Secondary | ICD-10-CM

## 2016-06-16 DIAGNOSIS — R530 Neoplastic (malignant) related fatigue: Secondary | ICD-10-CM

## 2016-06-16 DIAGNOSIS — M25552 Pain in left hip: Secondary | ICD-10-CM | POA: Diagnosis not present

## 2016-06-16 DIAGNOSIS — Z5112 Encounter for antineoplastic immunotherapy: Secondary | ICD-10-CM | POA: Diagnosis not present

## 2016-06-16 DIAGNOSIS — R310 Gross hematuria: Secondary | ICD-10-CM

## 2016-06-16 DIAGNOSIS — L732 Hidradenitis suppurativa: Secondary | ICD-10-CM

## 2016-06-16 LAB — COMPREHENSIVE METABOLIC PANEL
ALBUMIN: 3.5 g/dL (ref 3.5–5.0)
ALK PHOS: 34 U/L — AB (ref 38–126)
ALT: 28 U/L (ref 14–54)
ANION GAP: 5 (ref 5–15)
AST: 20 U/L (ref 15–41)
BUN: 9 mg/dL (ref 6–20)
CALCIUM: 9.1 mg/dL (ref 8.9–10.3)
CO2: 26 mmol/L (ref 22–32)
Chloride: 105 mmol/L (ref 101–111)
Creatinine, Ser: 0.61 mg/dL (ref 0.44–1.00)
GFR calc non Af Amer: >60 mL/min (ref >60)
Glucose, Bld: 108 mg/dL — ABNORMAL HIGH (ref 65–99)
POTASSIUM: 3.7 mmol/L (ref 3.5–5.1)
SODIUM: 136 mmol/L (ref 135–145)
TOTAL PROTEIN: 6.9 g/dL (ref 6.5–8.1)
Total Bilirubin: 0.2 mg/dL — ABNORMAL LOW (ref 0.3–1.2)

## 2016-06-16 LAB — CBC WITH DIFFERENTIAL/PLATELET
BASOS PCT: 0 %
Basophils Absolute: 0 10*3/uL (ref 0.0–0.1)
EOS ABS: 0.1 10*3/uL (ref 0.0–0.7)
EOS PCT: 1 %
HCT: 37.3 % (ref 36.0–46.0)
HEMOGLOBIN: 12 g/dL (ref 12.0–15.0)
LYMPHS ABS: 1 10*3/uL (ref 0.7–4.0)
Lymphocytes Relative: 9 %
MCH: 26.7 pg (ref 26.0–34.0)
MCHC: 32.2 g/dL (ref 30.0–36.0)
MCV: 82.9 fL (ref 78.0–100.0)
MONO ABS: 0.8 10*3/uL (ref 0.1–1.0)
MONOS PCT: 7 %
NEUTROS PCT: 83 %
Neutro Abs: 8.5 10*3/uL — ABNORMAL HIGH (ref 1.7–7.7)
Platelets: 454 10*3/uL — ABNORMAL HIGH (ref 150–400)
RBC: 4.5 MIL/uL (ref 3.87–5.11)
RDW: 15.3 % (ref 11.5–15.5)
WBC: 10.4 10*3/uL (ref 4.0–10.5)

## 2016-06-16 MED ORDER — PEGFILGRASTIM 6 MG/0.6ML ~~LOC~~ PSKT
6.0000 mg | PREFILLED_SYRINGE | Freq: Once | SUBCUTANEOUS | Status: AC
  Administered 2016-06-16: 6 mg via SUBCUTANEOUS
  Filled 2016-06-16: qty 0.6

## 2016-06-16 MED ORDER — DIPHENHYDRAMINE HCL 25 MG PO CAPS
50.0000 mg | ORAL_CAPSULE | Freq: Once | ORAL | Status: AC
  Administered 2016-06-16: 50 mg via ORAL

## 2016-06-16 MED ORDER — DEXAMETHASONE SODIUM PHOSPHATE 10 MG/ML IJ SOLN
INTRAMUSCULAR | Status: AC
  Filled 2016-06-16: qty 1

## 2016-06-16 MED ORDER — SODIUM CHLORIDE 0.9 % IV SOLN
375.0000 mg/m2 | Freq: Once | INTRAVENOUS | Status: AC
  Administered 2016-06-16: 900 mg via INTRAVENOUS
  Filled 2016-06-16: qty 50

## 2016-06-16 MED ORDER — AMLODIPINE BESYLATE 5 MG PO TABS: 5.0000 mg | ORAL_TABLET | Freq: Every day | ORAL | 1 refills | Status: DC

## 2016-06-16 MED ORDER — ACETAMINOPHEN 325 MG PO TABS
ORAL_TABLET | ORAL | Status: AC
  Filled 2016-06-16: qty 2

## 2016-06-16 MED ORDER — SODIUM CHLORIDE 0.9% FLUSH
10.0000 mL | INTRAVENOUS | Status: DC | PRN
  Administered 2016-06-16: 10 mL
  Filled 2016-06-16: qty 10

## 2016-06-16 MED ORDER — HYDROCODONE-ACETAMINOPHEN 5-325 MG PO TABS: 1.0000 | ORAL_TABLET | ORAL | 0 refills | Status: DC | PRN

## 2016-06-16 MED ORDER — DOXORUBICIN HCL CHEMO IV INJECTION 2 MG/ML
50.0000 mg/m2 | Freq: Once | INTRAVENOUS | Status: AC
  Administered 2016-06-16: 116 mg via INTRAVENOUS
  Filled 2016-06-16: qty 58

## 2016-06-16 MED ORDER — STERILE WATER FOR INJECTION IJ SOLN
INTRAMUSCULAR | Status: AC
  Filled 2016-06-16: qty 10

## 2016-06-16 MED ORDER — ALTEPLASE 2 MG IJ SOLR
2.0000 mg | Freq: Once | INTRAMUSCULAR | Status: AC | PRN
  Administered 2016-06-16: 2 mg

## 2016-06-16 MED ORDER — VINCRISTINE SULFATE CHEMO INJECTION 1 MG/ML
1.0000 mg | Freq: Once | INTRAVENOUS | Status: AC
  Administered 2016-06-16: 1 mg via INTRAVENOUS
  Filled 2016-06-16: qty 1

## 2016-06-16 MED ORDER — CEPHALEXIN 500 MG PO CAPS: 500.0000 mg | ORAL_CAPSULE | Freq: Three times a day (TID) | ORAL | 0 refills | Status: AC

## 2016-06-16 MED ORDER — HEPARIN SOD (PORK) LOCK FLUSH 100 UNIT/ML IV SOLN
500.0000 [IU] | Freq: Once | INTRAVENOUS | Status: AC | PRN
  Administered 2016-06-16: 500 [IU]
  Filled 2016-06-16: qty 5

## 2016-06-16 MED ORDER — PALONOSETRON HCL INJECTION 0.25 MG/5ML
0.2500 mg | Freq: Once | INTRAVENOUS | Status: AC
  Administered 2016-06-16: 0.25 mg via INTRAVENOUS

## 2016-06-16 MED ORDER — DEXAMETHASONE SODIUM PHOSPHATE 10 MG/ML IJ SOLN
10.0000 mg | Freq: Once | INTRAMUSCULAR | Status: AC
  Administered 2016-06-16: 10 mg via INTRAVENOUS

## 2016-06-16 MED ORDER — SODIUM CHLORIDE 0.9 % IV SOLN: 10.0000 mg | Freq: Once | INTRAVENOUS | Status: DC

## 2016-06-16 MED ORDER — DIPHENHYDRAMINE HCL 25 MG PO CAPS
ORAL_CAPSULE | ORAL | Status: AC
  Filled 2016-06-16: qty 2

## 2016-06-16 MED ORDER — ALTEPLASE 2 MG IJ SOLR
INTRAMUSCULAR | Status: AC
  Filled 2016-06-16: qty 2

## 2016-06-16 MED ORDER — ACETAMINOPHEN 325 MG PO TABS
650.0000 mg | ORAL_TABLET | Freq: Once | ORAL | Status: AC
  Administered 2016-06-16: 650 mg via ORAL

## 2016-06-16 MED ORDER — CYCLOPHOSPHAMIDE CHEMO INJECTION 1 GM
750.0000 mg/m2 | Freq: Once | INTRAMUSCULAR | Status: AC
  Administered 2016-06-16: 1740 mg via INTRAVENOUS
  Filled 2016-06-16: qty 87

## 2016-06-16 MED ORDER — SODIUM CHLORIDE 0.9 % IV SOLN
Freq: Once | INTRAVENOUS | Status: AC
Start: 2016-06-16 — End: 2016-06-16
  Administered 2016-06-16: 11:00:00 via INTRAVENOUS

## 2016-06-16 MED ORDER — PALONOSETRON HCL INJECTION 0.25 MG/5ML
INTRAVENOUS | Status: AC
  Filled 2016-06-16: qty 5

## 2016-06-16 NOTE — Progress Notes (Signed)
Altaplase 56ml given due to no blood return from port. Given at 1050.  1025. Was able to get blood return , pulled off 82ml from port.  Chemotherapy given today per orders. Patient tolerated it well, no problems. Vitals stable, MD aware of BP issues. Follow up as scheduled.

## 2016-06-16 NOTE — Progress Notes (Signed)
[place]  PROGRESS NOTE  Patient Care Team: Pcp Not In System as PCP - General  CHIEF COMPLAINTS/PURPOSE OF CONSULTATION:     Hodgkin lymphoma, nodular lymphocyte predominance ([place])   04/07/2016 Procedure    Excisional lymph node biopsy of right thigh      04/11/2016 Pathology Results    Nodular lymphocyte predominant hodgkin Lymphoma      04/23/2016 Miscellaneous    Normal ESR, LDH      04/25/2016 PET scan    1. Hypermetabolic enlarged bilateral axillary, bilateral external iliac and bilateral inguinal lymph nodes consistent with lymphoma. 2. Hypermetabolic foci of skin thickening in the bilateral axilla and bilateral lower ventral chest wall, nonspecific, cannot exclude cutaneous lymphoma. 3. Hypermetabolism throughout Waldeyer's ring with associated mucosal thickening on the CT images, which could be inflammatory or due to lymphoma. 4. No splenic enlargement or hypermetabolism. 5. Diffuse hypermetabolism throughout the axial skeleton without discrete bone lesions on the CT images, worrisome for diffuse osseous involvement by lymphoma.      04/29/2016 Imaging    MUGA- Left ventricular ejection fraction equals 66 %.      05/05/2016 -  Chemotherapy    The patient had DOXOrubicin (ADRIAMYCIN) chemo injection 116 mg, 50 mg/m2 = 116 mg, Intravenous,  Once, 1 of 6 cycles  palonosetron (ALOXI) injection 0.25 mg, 0.25 mg, Intravenous,  Once, 1 of 6 cycles  pegfilgrastim (NEULASTA ONPRO KIT) injection 6 mg, 6 mg, Subcutaneous, Once, 1 of 6 cycles  vinCRIStine (ONCOVIN) 2 mg in sodium chloride 0.9 % 50 mL chemo infusion, 2 mg, Intravenous,  Once, 1 of 6 cycles  riTUXimab (RITUXAN) 900 mg in sodium chloride 0.9 % 250 mL (2.6471 mg/mL) chemo infusion, 375 mg/m2 = 900 mg, Intravenous,  Once, 1 of 6 cycles  cyclophosphamide (CYTOXAN) 1,740 mg in sodium chloride 0.9 % 250 mL chemo infusion, 750 mg/m2 = 1,740 mg, Intravenous,  Once, 1 of 6 cycles  for chemotherapy  treatment.         HISTORY OF PRESENTING ILLNESS:  [name] 30 y.o. 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 I have discussed her case with [facility] and based upon guidelines, reading and [facility] consultation RCHOP was recommended. [name] is accompanied by her husband and presents in treatment bed. Patient is here for cycle #3 of RCHOP.  She reports persistent tingling in her finger tips. It is tingling to the point it is difficult to open packages. She denies tingling in her feet. She started to notice the neuropathy about 2 weeks after her first treatment. When her friend gave her peppermint oil for nausea she assumed the tingling was from the peppermint oil. She reports the neuropathy is worse in the mornings when she goes to log into her computer for work. She notes the tingling is worse in the right hand than the left.  She reports puffiness in her left ankle that she first noticed about 3 days ago. She reports pain in her left hip with standing and moving. She typically will sit for a few minutes to resolve the pain then get back up to do things. This is new. She denies pain in the back of her knee or calf. No lower leg swelling.  She reports a knot in her upper right arm. She shaved last Saturday and has noticed after she shaves she gets "a place". She also notes a place on the back of her neck. 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   She denies fever. Her appetite comes and goes, "I have to make myself eat". She denies mouth sores.  She reports nausea. "The last time I had chemo I was sick from day 2. I was horribly sick". She admits she was taking her nausea medication wrong. She didn't realize she could take both her medications. She was only able to eat chicken noodle soup and orange juice. She denies vomiting. She did not think to call here, her husband notes that he told her to call.  Her bowels are doing  okay with taking Miralax every night.  She reports an area of skin on her right forearm that has gotten darker with treatment.   She denies any more hematuria but she continues to have vaginal bleeding.   She has been staying positive. She does admit, "It's tough some days". She constantly prays and reads her Bible for comfort.  MEDICAL HISTORY:  Past Medical History:  Diagnosis Date  . Asthma    had since childhood; has not had to use inhaler or nebulizer for 8-9 years. Been doing well.  Marland Kitchen Headache(784.0)    usually controlled c Tylenol when not pregnant, but more painful headaches  during preg. due to hypertension.  . Hidradenitis suppurativa   . Hodgkin lymphoma, nodular lymphocyte predominance ([place]) 04/15/2016  . Hypertension December 2011   Been on [place] since Dec. 2011  . Sleep apnea     SURGICAL HISTORY: Past Surgical History:  Procedure Laterality Date  . ADENOIDECTOMY     age 39  . BREAST RECONSTRUCTION  December 2001   breast reduction at age 54  . MASS EXCISION Right 04/07/2016   Procedure: EXCISION/BIOPSY SOFT TISSUE MASS RIGHT THIGH;  Surgeon: Aviva Signs, MD;  Location: [facility];  Service: General;  Laterality: Right;  . port a cath placed    . PORTACATH PLACEMENT Left 04/28/2016   Procedure: INSERTION PORT-A-CATH;  Surgeon: Aviva Signs, MD;  Location: [facility];  Service: General;  Laterality: Left;    SOCIAL HISTORY: Social History   Social History  . Marital status: Married    Spouse name: N/A  . Number of children: N/A  . Years of education: N/A   Occupational History  . Not on file.   Social History Main Topics  . Smoking status: Never Smoker  . Smokeless tobacco: Never Used  . Alcohol use No  . Drug use: No  . Sexual activity: Yes    Birth control/ protection: IUD   Other Topics Concern  . Not on file   Social History Narrative  . No narrative on file  She has two children. One is 4 and one is 13. She also has a dog. She like to dance.    She has been married 8 years, but has been with her husband a total of 18 years.  Patient works from home for [employer].  FAMILY HISTORY: Family History  Problem Relation Age of Onset  . Hypertension Mother   . Cancer Mother   . Hypertension Father   . Heart disease Maternal Grandmother     ALLERGIES:  has No Known Allergies.  MEDICATIONS:  Current Outpatient Prescriptions  Medication Sig Dispense Refill  . allopurinol (ZYLOPRIM) 300 MG tablet Take 1 tablet (300 mg total) by mouth daily. 30 tablet 3  . ALPRAZolam (XANAX) 0.5 MG tablet Take 1 tablet (0.5 mg total) by mouth 3 (three) times daily as needed for anxiety.  15 tablet 0  . CYCLOPHOSPHAMIDE IV Inject into the vein. Every 21 days    . DOXORUBICIN HCL IV Inject into the vein. Every 21 days    . HYDROcodone-acetaminophen (NORCO) 5-325 MG tablet Take 1-2 tablets by mouth every 4 (four) hours as needed for moderate pain. 45 tablet 0  . ibuprofen (ADVIL,MOTRIN) 200 MG tablet Take 400-600 mg by mouth every 6 (six) hours as needed for moderate pain.    Marland Kitchen levonorgestrel (MIRENA) 20 MCG/24HR IUD 1 each by Intrauterine route once.    . lidocaine-prilocaine (EMLA) cream Apply to affected area once 30 g 3  . ondansetron (ZOFRAN) 8 MG tablet TAKE 1 TABLET BY MOUTH 2 TIMES DAILY AS NEEDED FOR REFRACTORY NAUSEA/VOMITING. START ON DAY 3 AFTER CYCLOPHOSAMIDE. 30 tablet 0  . Pegfilgrastim (NEULASTA ONPRO [place]) Inject into the skin. Every 21 days    . phenazopyridine (PYRIDIUM) 200 MG tablet Take 1 tablet (200 mg total) by mouth 3 (three) times daily. 6 tablet 0  . predniSONE (DELTASONE) 20 MG tablet Take 4.5 tablets (90 mg total) by mouth daily. Take on days 1-5 of chemotherapy. 45 tablet 6  . prochlorperazine (COMPAZINE) 10 MG tablet Take 1 tablet (10 mg total) by mouth every 6 (six) hours as needed (Nausea or vomiting). 30 tablet 6  . RiTUXimab (RITUXAN IV) Inject into the vein. Every 21 days    . temazepam (RESTORIL) 30 MG capsule Take  1 capsule (30 mg total) by mouth at bedtime. 30 capsule 1  . VINCRISTINE SULFATE IV Inject into the vein. Every 21 days    . Vitamin D, Ergocalciferol, (DRISDOL) 50000 units CAPS capsule Take 50,000 Units by mouth every 7 (seven) days. Takes on Mondays.    Marland Kitchen amLODipine (NORVASC) 5 MG tablet Take 1 tablet (5 mg total) by mouth daily. 30 tablet 1  . cephALEXin (KEFLEX) 500 MG capsule Take 1 capsule (500 mg total) by mouth 3 (three) times daily. 21 capsule 0   No current facility-administered medications for this visit.    Facility-Administered Medications Ordered in Other Visits  Medication Dose Route Frequency Provider Last Rate Last Dose  . sodium chloride flush (NS) 0.9 % injection 10 mL  10 mL Intracatheter PRN Patrici Ranks, MD   10 mL at 06/16/16 0940    Review of Systems  Constitutional: Negative for fever.       Poor appetite  HENT: Negative.   Eyes: Negative.   Respiratory: Negative.   Cardiovascular: Negative.        Left ankle swelling  Gastrointestinal: Positive for nausea. Negative for vomiting.       Bowels okay with daily Miralax  Genitourinary: Negative.  Negative for hematuria.       Vaginal bleeding  Musculoskeletal: Positive for joint pain.       Left hip pain  Skin: Negative.        Lesion on upper right arm and back of neck Area of skin darkening on right forearm  Neurological: Positive for tingling.       Tingling in finger nail beds. Neuropathy worse in the mornings.  Endo/Heme/Allergies: Negative.   Psychiatric/Behavioral: Negative.   All other systems reviewed and are negative.  14 point ROS was done and is otherwise as detailed above or in HPI   PHYSICAL EXAMINATION: ECOG PERFORMANCE STATUS: 1 - Symptomatic but completely ambulatory  Vitals with BMI 06/16/2016  Height   Weight 276 lbs 10 oz  BMI   Systolic 355  Diastolic 732  Pulse 108  Respirations 20    Physical Exam  Constitutional: She is oriented to person, place, and time and  well-developed, well-nourished, and in no distress.  Obese  HENT:  Head: Normocephalic and atraumatic.  Nose: Nose normal.  Mouth/Throat: Oropharynx is clear and moist. No oropharyngeal exudate.  Eyes: Conjunctivae and EOM are normal. Pupils are equal, round, and reactive to light. Right eye exhibits no discharge. Left eye exhibits no discharge. No scleral icterus.  Neck: Normal range of motion. Neck supple. No tracheal deviation present. No thyromegaly present.  Cardiovascular: Normal rate, regular rhythm and normal heart sounds.  Exam reveals no gallop and no friction rub.   No murmur heard. Pulmonary/Chest: Effort normal and breath sounds normal. She has no wheezes. She has no rales.  Abdominal: Soft. Bowel sounds are normal. She exhibits no distension and no mass. There is no tenderness. There is no rebound and no guarding.  Musculoskeletal: Normal range of motion. She exhibits no edema.  Lymphadenopathy:    She has no cervical adenopathy.  Neurological: She is alert and oriented to person, place, and time. She has normal reflexes. No cranial nerve deficit. Gait normal. Coordination normal.  Skin: Skin is warm and dry. No rash noted.  Superficial comedones noted on the upper right arm and back of neck Right forearm circular area of darkened skin  Psychiatric: Mood, memory, affect and judgment normal.  Nursing note and vitals reviewed.   LABORATORY DATA:  I have reviewed the data as listed Lab Results  Component Value Date   WBC 10.4 06/16/2016   HGB 12.0 06/16/2016   HCT 37.3 06/16/2016   MCV 82.9 06/16/2016   PLT 454 (H) 06/16/2016   CMP     Component Value Date/Time   NA 136 06/16/2016 0955   K 3.7 06/16/2016 0955   CL 105 06/16/2016 0955   CO2 26 06/16/2016 0955   GLUCOSE 108 (H) 06/16/2016 0955   BUN 9 06/16/2016 0955   CREATININE 0.61 06/16/2016 0955   CREATININE 0.78 02/16/2013 1721   CALCIUM 9.1 06/16/2016 0955   PROT 6.9 06/16/2016 0955   ALBUMIN 3.5  06/16/2016 0955   AST 20 06/16/2016 0955   ALT 28 06/16/2016 0955   ALKPHOS 34 (L) 06/16/2016 0955   BILITOT 0.2 (L) 06/16/2016 0955   GFRNONAA >60 06/16/2016 0955   GFRNONAA >89 02/16/2013 1721   GFRAA >60 06/16/2016 0955   GFRAA >89 02/16/2013 1721    PATHOLOGY:    RADIOGRAPHIC STUDIES: I have personally reviewed the radiological images as listed and agreed with the findings in the report. CLINICAL DATA:  Hematuria. Stage IV Hodgkin's lymphoma, nodular lymphocyte-predominant  EXAM: CT ABDOMEN AND PELVIS WITH CONTRAST  TECHNIQUE: Multidetector CT imaging of the abdomen and pelvis was performed using the standard protocol following bolus administration of intravenous contrast. Oral contrast was also administered.  CONTRAST:  166m ISOVUE-300 IOPAMIDOL (ISOVUE-300) INJECTION 61%  COMPARISON:  PET-CT April 25, 2016  FINDINGS: Lower chest: Lung bases are clear.  Hepatobiliary: No focal liver lesions are evident. There is questionable sludge in the gallbladder. The gallbladder wall does not appear appreciably thickened. There is no biliary duct dilatation.  Pancreas: No pancreatic mass or inflammatory focus.  Spleen: No splenomegaly.  No splenic lesions are evident.  Adrenals/Urinary Tract: Adrenals appear normal bilaterally. Kidneys bilaterally show no evidence of mass or hydronephrosis on either side. There is no renal or ureteral calculus on either side. Urinary bladder is midline. The urinary bladder wall appears thickened along  its posterior inferior aspects. No urinary bladder mass evident.  Stomach/Bowel: There is no appreciable bowel wall or mesenteric thickening. No bowel obstruction. No free air or portal venous air.  Vascular/Lymphatic: There are prominent lymph nodes bilaterally in each external iliac node chain. There is a lymph node in the right external iliac node chain measuring 2.6 x 1.7 cm, slightly smaller than on recent PET study.  There is a lymph node in the left external iliac node chain measuring 1.8 x 0.9 cm, marginally smaller. A second lymph node in this area measures 1.9 x 1.3 cm, similar to recent study. There is mild adenopathy in the inguinal regions bilaterally. There is incomplete visualization of a known significantly enlarged lymph node in the right inguinal region anteriorly. Only a small portion of this lymph node is seen. Other lymph nodes in the inguinal regions appear essentially stable. These areas of adenopathy showed abnormal uptake on recent PET study. Elsewhere, there are scattered subcentimeter mesenteric lymph nodes which appear stable. No new areas of adenopathy are appreciable.  There is no apparent vascular lesion.  No abdominal aortic aneurysm.  Reproductive: Uterus is anteverted. Intrauterine device is positioned within the uterus. There is no pelvic mass or pelvic fluid collection beyond the adenopathy noted in the pelvis.  Other: Appendix appears normal. No ascites or abscess evident in the abdomen or pelvis. There is a small ventral hernia containing only fat.  Musculoskeletal: No blastic or lytic bone lesions are evident by CT. No intramuscular or abdominal wall lesion is evident.  IMPRESSION: Urinary bladder wall thickening along the posterior and inferior aspects of the bladder. Question a degree of cystitis. No renal or ureteral calculus. No hydronephrosis.  Pelvic and inguinal adenopathy, also noted on recent PET study with abnormal radiotracer uptake in these areas. There is only partial visualization of a known significantly enlarged right inguinal lymph node currently.  There are subcentimeter mesenteric lymph nodes, stable and of uncertain significance.  Intrauterine device positioned within the uterus.  No bowel obstruction. No abscess. Appendix appears normal. No focal liver lesions are evident. There is questionable sludge in the gallbladder  without gallbladder wall thickening.   Electronically Signed   By: Lowella Grip III M.D.   On: 05/11/2016 14:21   ASSESSMENT & PLAN:  Hodgkin Lymphoma Nodular Lymphocyte Predominant Disease, Stage IV No B symptoms Cancer related Fatigue Normal LDH Normal ESR Chemotherapy induced neuropathy HTN Hematuria, gross L Hip pain   Patient is here for cycle #3 of RCHOP. I have decreased her Vincristine due to chemotherapy related neuropathy.  We discussed her mood, currently she feels like she is doing ok.  I have arranged for PET/CT prior to her next cycle. She has a cystoscopy with Dr. Alyson Ingles on the 15th to evaluate her hematuria and bladder findings.   BP today is 151/105. On review she was hypertensive prior to therapy. I have started her on norvasc. We discussed this today.   During physical exam, a circular area of skin darkening was noted on her right forearm. We will continue to monitor this.  Keflex was prescribed for her hidra-adenitis.  Plain films of her L hip were ordered today given her new hip complaints. She will be advised of the results.   She does not need any refills at this time.   Patient is scheduled to follow up with [name] on 07/07/2016.  Orders Placed This Encounter  Procedures  . DG HIP UNILAT WITH PELVIS 2-3 VIEWS LEFT  Standing Status:   Future    Number of Occurrences:   1    Standing Expiration Date:   08/16/2017    Order Specific Question:   Reason for Exam (SYMPTOM  OR DIAGNOSIS REQUIRED)    Answer:   L hip pain    Order Specific Question:   Is patient pregnant?    Answer:   No    Order Specific Question:   Preferred imaging location?    Answer:   [place] [facility] Rest[facility] ([facility]) Skull Base To Thigh    Standing Status:   Future    Standing Expiration Date:   06/16/2017    Order Specific Question:   Reason for Exam (SYMPTOM  OR DIAGNOSIS REQUIRED)    Answer:   restaging Hodgkin Lymphoma    Order Specific  Question:   Is the patient pregnant?    Answer:   No    Order Specific Question:   Preferred imaging location?    Answer:   [facility]    Order Specific Question:   If indicated for the ordered procedure, I authorize the administration of a radiopharmaceutical per Radiology protocol    Answer:   Yes  . CBC with Differential    Standing Status:   Future    Standing Expiration Date:   06/16/2017  . Comprehensive metabolic panel    Standing Status:   Future    Standing Expiration Date:   06/16/2017     FROM UP TO DATE: Combination chemotherapy is the main treatment for patients with stage III/IV NLPHL. When treated with alkylator-based regimens, adults with NLPHL respond to treatment in a similar fashion to patients with cHL and identical clinical stage. There are limited data regarding the best chemotherapy regimen for this population, and clinical practice varies. For most patients with advanced disease, we suggest the use of R-CHOP (rituximab, cyclophosphamide, doxorubicin, vincristine, and prednisone) (table 3) rather than ABVD (Grade 2C). This preference is primarily based upon our clinical experience, the desire to use rituximab in a CD20 positive tumor, and limited data that suggest that ABVD (doxorubicin, bleomycin, vinblastine, and dacarbazine) is not as effective as other alkylator-based regimens. (See 'Stage III/IV disease' above.) ?Approximately 15 to 30 percent of patients with NLPHL relapse. Relapsed disease tends to be responsive to further chemotherapy and/or involved-node or involved-site RT. Patients with relapsed disease should be restaged and treated accordingly.   How I treat nodular lymphocyte predominant Hodgkin lymphoma Ranjana H. Advani and Richard T. [place]  Blood 2013 [id number]; doi: [email or web address]   At [facility], patients with advanced disease are treated with curative intent. Given the universal expression of CD20, our usual  first-line approach incorporates rituximab with regimens used for cHL. For patients who present with B symptoms or abdominal involvement, scenarios in which there may be occult transformation, we favor using RCHOP for 6 cycles NLPHL is rare, with few prospective trials to guide therapy. RT continues to play an integral role in stage I to II disease, as does chemotherapy in advanced stages. The optimal chemotherapy is debatable, and some data suggest that including an alkylating agent may improve efficacy. Although the results with single-agent rituximab in the front-line setting are inferior to conventional therapy, rituximab is a reasonable choice for patients with relapsed disease. The inherent risk of developing an aggressive B-cell lymphoma underscores the importance of long-term follow-up, as well as rebiopsy at relapse. Randomized trials are likely not feasible because of the rarity of NLPHL; however,  strategies to consider testing include "watch and wait" for selected patients, such as young children with limited asymptomatic disease or individuals with totally excised solitary nodal involvement, and combinations of targeted therapy, such as rituximab or other anti-CD20 monoclonal antibodies with conventional therapy. The ultimate goal must be maintenance of excellent PFS and minimization of risk for late effects  All questions were answered. The patient knows to call the clinic with any problems, questions or concerns.  This document serves as a record of services personally performed by Ancil Linsey, MD. It was created on her behalf by Martinique Casey, a trained medical scribe. The creation of this record is based on the scribe's personal observations and the provider's statements to them. This document has been checked and approved by the attending provider.  I have reviewed the above documentation for accuracy and completeness, and I agree with the above.  This note was electronically signed.      Molli Hazard, MD  06/16/2016 5:47 PM

## 2016-06-16 NOTE — Patient Instructions (Signed)
[facility] Discharge Instructions for Patients Receiving Chemotherapy   Beginning January 23rd 2017 lab work for the [facility] will be done in the  Main lab at [facility] on 1st floor. If you have a lab appointment with the [facility] please come in thru the  Main Entrance and check in at the main information desk   Today you received the following chemotherapy agents RCOP  To help prevent nausea and vomiting after your treatment, we encourage you to take your nausea medication     If you develop nausea and vomiting, or diarrhea that is not controlled by your medication, call the clinic.  The clinic phone number is [phone]. Office hours are Monday-Friday 8:30am-5:00pm.  BELOW ARE SYMPTOMS THAT SHOULD BE REPORTED IMMEDIATELY:  *FEVER GREATER THAN 101.0 F  *CHILLS WITH OR WITHOUT FEVER  NAUSEA AND VOMITING THAT IS NOT CONTROLLED WITH YOUR NAUSEA MEDICATION  *UNUSUAL SHORTNESS OF BREATH  *UNUSUAL BRUISING OR BLEEDING  TENDERNESS IN MOUTH AND THROAT WITH OR WITHOUT PRESENCE OF ULCERS  *URINARY PROBLEMS  *BOWEL PROBLEMS  UNUSUAL RASH Items with * indicate a potential emergency and should be followed up as soon as possible. If you have an emergency after office hours please contact your primary care physician or go to the nearest emergency department.  Please call the clinic during office hours if you have any questions or concerns.   You may also contact the Patient Navigator at [phone] should you have any questions or need assistance in obtaining follow up care.      Resources For Cancer Patients and their Caregivers ? American Cancer Society: Can assist with transportation, wigs, general needs, runs Look Good Feel Better.        812-398-0563 ? Cancer Care: Provides financial assistance, online support groups, medication/co-pay assistance.  1-800-813-HOPE (563) 039-9649) ? [place] Assists [place] cancer  patients and their families through emotional , educational and financial support.  873-666-1434 ? [place] DSS Where to apply for food stamps, Medicaid and utility assistance. (251)176-2653 ? [facility]: Transportation to medical appointments. (316) 832-3670 ? Social Security Administration: May apply for disability if have a Stage IV cancer. 269-710-6467 5704505195 ? [employer], Disability and Transit Services: Assists with nutrition, care and transit needs. (980)502-7204

## 2016-06-16 NOTE — Patient Instructions (Addendum)
[place] at [facility] Discharge Instructions  RECOMMENDATIONS MADE BY THE CONSULTANT AND ANY TEST RESULTS WILL BE SENT TO YOUR REFERRING PHYSICIAN.  You saw Dr.Penland today. Follow up in 3 weeks with labs, chemo and MD appt.  Left hip x-ray today. Start Norvasc for blood pressure. Start antibiotic- Keflex 500mg  three times a day. PET scan will be scheduled. See Amy at checkout for appointments.  Thank you for choosing [place] at [facility] to provide your oncology and hematology care.  To afford each patient quality time with our provider, please arrive at least 15 minutes before your scheduled appointment time.   Beginning January 23rd 2017 lab work for the [facility] will be done in the  Main lab at [facility] on 1st floor. If you have a lab appointment with the [facility] please come in thru the  Main Entrance and check in at the main information desk  You need to re-schedule your appointment should you arrive 10 or more minutes late.  We strive to give you quality time with our providers, and arriving late affects you and other patients whose appointments are after yours.  Also, if you no show three or more times for appointments you may be dismissed from the clinic at the providers discretion.     Again, thank you for choosing [facility].  Our hope is that these requests will decrease the amount of time that you wait before being seen by our physicians.       _____________________________________________________________  Should you have questions after your visit to [facility], please contact our office at [phone] between the hours of 8:30 a.m. and 4:30 p.m.  Voicemails left after 4:30 p.m. will not be returned until the following business day.  For prescription refill requests, have your pharmacy contact our office.         Resources For Cancer Patients and their Caregivers ? American  Cancer Society: Can assist with transportation, wigs, general needs, runs Look Good Feel Better.        959-660-5374 ? Cancer Care: Provides financial assistance, online support groups, medication/co-pay assistance.  1-800-813-HOPE (682) 507-4726) ? [facility] Assists [place] cancer patients and their families through emotional , educational and financial support.  336-344-8768 ? [place] DSS Where to apply for food stamps, Medicaid and utility assistance. 9472604591 ? RCATS: Transportation to medical appointments. 9074218019 ? Social Security Administration: May apply for disability if have a Stage IV cancer. 708-201-3328 5152273572 ? [employer], Disability and Transit Services: Assists with nutrition, care and transit needs. [place] Support Programs: @10RELATIVEDAYS @ > Cancer Support Group  2nd Tuesday of the month 1pm-2pm, Journey Room  > Creative Journey  3rd Tuesday of the month 1130am-1pm, Journey Room  > Look Good Feel Better  1st Wednesday of the month 10am-12 noon, Journey Room (Call [place] to register [phone])

## 2016-07-01 ENCOUNTER — Telehealth (HOSPITAL_COMMUNITY): Payer: Self-pay | Admitting: Emergency Medicine

## 2016-07-01 NOTE — Telephone Encounter (Signed)
Pt called and stated that she has not been bleeding since they made the appt for the cystoscopy.  She was wanting to know if they could cancel the appt.  Spoke with Dr Whitney Muse.  We cannot cancel the appt because the scan was abnormal.  Pt verbalized understanding.

## 2016-07-02 ENCOUNTER — Ambulatory Visit (INDEPENDENT_AMBULATORY_CARE_PROVIDER_SITE_OTHER): Payer: 59 | Admitting: Urology

## 2016-07-02 DIAGNOSIS — D414 Neoplasm of uncertain behavior of bladder: Secondary | ICD-10-CM

## 2016-07-02 DIAGNOSIS — R310 Gross hematuria: Secondary | ICD-10-CM | POA: Diagnosis not present

## 2016-07-04 ENCOUNTER — Ambulatory Visit (HOSPITAL_COMMUNITY)
Admission: RE | Admit: 2016-07-04 | Discharge: 2016-07-04 | Disposition: A | Discharge status: Home / Self Care | Payer: 59 | Source: Ambulatory Visit | Attending: Hematology & Oncology | Admitting: Hematology & Oncology

## 2016-07-04 DIAGNOSIS — C8108 Nodular lymphocyte predominant Hodgkin lymphoma, lymph nodes of multiple sites: Secondary | ICD-10-CM

## 2016-07-04 DIAGNOSIS — M25552 Pain in left hip: Secondary | ICD-10-CM

## 2016-07-04 LAB — GLUCOSE, CAPILLARY: GLUCOSE-CAPILLARY: 86 mg/dL (ref 65–99)

## 2016-07-04 MED ORDER — FLUDEOXYGLUCOSE F - 18 (FDG) INJECTION
13.7000 | Freq: Once | INTRAVENOUS | Status: AC | PRN
  Administered 2016-07-04: 13.7 via INTRAVENOUS

## 2016-07-07 ENCOUNTER — Encounter (HOSPITAL_COMMUNITY): Payer: Self-pay | Attending: Oncology | Admitting: Oncology

## 2016-07-07 ENCOUNTER — Encounter (HOSPITAL_COMMUNITY): Payer: 59 | Attending: Hematology & Oncology

## 2016-07-07 ENCOUNTER — Encounter (HOSPITAL_COMMUNITY): Payer: Self-pay | Admitting: Oncology

## 2016-07-07 ENCOUNTER — Encounter (HOSPITAL_COMMUNITY): Payer: Self-pay

## 2016-07-07 VITALS — BP 123/60 | HR 110 | Temp 98.0°F | Resp 18 | Wt 279.4 lb

## 2016-07-07 DIAGNOSIS — Z452 Encounter for adjustment and management of vascular access device: Secondary | ICD-10-CM

## 2016-07-07 DIAGNOSIS — Z5112 Encounter for antineoplastic immunotherapy: Secondary | ICD-10-CM

## 2016-07-07 DIAGNOSIS — Z79899 Other long term (current) drug therapy: Secondary | ICD-10-CM | POA: Insufficient documentation

## 2016-07-07 DIAGNOSIS — C8105 Nodular lymphocyte predominant Hodgkin lymphoma, lymph nodes of inguinal region and lower limb: Secondary | ICD-10-CM

## 2016-07-07 DIAGNOSIS — R309 Painful micturition, unspecified: Secondary | ICD-10-CM | POA: Diagnosis not present

## 2016-07-07 DIAGNOSIS — Z5111 Encounter for antineoplastic chemotherapy: Secondary | ICD-10-CM | POA: Diagnosis not present

## 2016-07-07 DIAGNOSIS — E876 Hypokalemia: Secondary | ICD-10-CM

## 2016-07-07 DIAGNOSIS — L989 Disorder of the skin and subcutaneous tissue, unspecified: Secondary | ICD-10-CM

## 2016-07-07 DIAGNOSIS — C8108 Nodular lymphocyte predominant Hodgkin lymphoma, lymph nodes of multiple sites: Secondary | ICD-10-CM

## 2016-07-07 LAB — COMPREHENSIVE METABOLIC PANEL
ALT: 20 U/L (ref 14–54)
ANION GAP: 7 (ref 5–15)
AST: 15 U/L (ref 15–41)
Albumin: 3.6 g/dL (ref 3.5–5.0)
Alkaline Phosphatase: 36 U/L — ABNORMAL LOW (ref 38–126)
BUN: 8 mg/dL (ref 6–20)
CHLORIDE: 104 mmol/L (ref 101–111)
CO2: 25 mmol/L (ref 22–32)
Calcium: 9 mg/dL (ref 8.9–10.3)
Creatinine, Ser: 0.61 mg/dL (ref 0.44–1.00)
Glucose, Bld: 89 mg/dL (ref 65–99)
POTASSIUM: 3.3 mmol/L — AB (ref 3.5–5.1)
Sodium: 136 mmol/L (ref 135–145)
Total Bilirubin: 0.3 mg/dL (ref 0.3–1.2)
Total Protein: 6.9 g/dL (ref 6.5–8.1)

## 2016-07-07 LAB — CBC WITH DIFFERENTIAL/PLATELET
Basophils Absolute: 0 K/uL (ref 0.0–0.1)
Basophils Relative: 1 %
Eosinophils Absolute: 0.1 K/uL (ref 0.0–0.7)
Eosinophils Relative: 2 %
HCT: 35.2 % — ABNORMAL LOW (ref 36.0–46.0)
Hemoglobin: 11.4 g/dL — ABNORMAL LOW (ref 12.0–15.0)
Lymphocytes Relative: 14 %
Lymphs Abs: 1.2 K/uL (ref 0.7–4.0)
MCH: 27 pg (ref 26.0–34.0)
MCHC: 32.4 g/dL (ref 30.0–36.0)
MCV: 83.4 fL (ref 78.0–100.0)
Monocytes Absolute: 1 K/uL (ref 0.1–1.0)
Monocytes Relative: 13 %
Neutro Abs: 5.8 K/uL (ref 1.7–7.7)
Neutrophils Relative %: 70 %
Platelets: 427 K/uL — ABNORMAL HIGH (ref 150–400)
RBC: 4.22 MIL/uL (ref 3.87–5.11)
RDW: 15.7 % — ABNORMAL HIGH (ref 11.5–15.5)
WBC: 8.2 K/uL (ref 4.0–10.5)

## 2016-07-07 MED ORDER — ALPRAZOLAM 0.5 MG PO TABS: 0.5000 mg | ORAL_TABLET | Freq: Three times a day (TID) | ORAL | 1 refills | Status: DC | PRN

## 2016-07-07 MED ORDER — SODIUM CHLORIDE 0.9 % IV SOLN: 10.0000 mg | Freq: Once | INTRAVENOUS | Status: DC

## 2016-07-07 MED ORDER — SODIUM CHLORIDE 0.9 % IV SOLN
750.0000 mg/m2 | Freq: Once | INTRAVENOUS | Status: AC
  Administered 2016-07-07: 1740 mg via INTRAVENOUS
  Filled 2016-07-07: qty 50

## 2016-07-07 MED ORDER — DEXAMETHASONE SODIUM PHOSPHATE 10 MG/ML IJ SOLN
10.0000 mg | Freq: Once | INTRAMUSCULAR | Status: AC
  Administered 2016-07-07: 10 mg via INTRAVENOUS
  Filled 2016-07-07: qty 1

## 2016-07-07 MED ORDER — DOXORUBICIN HCL CHEMO IV INJECTION 2 MG/ML
50.0000 mg/m2 | Freq: Once | INTRAVENOUS | Status: AC
  Administered 2016-07-07: 116 mg via INTRAVENOUS
  Filled 2016-07-07: qty 58

## 2016-07-07 MED ORDER — ALTEPLASE 2 MG IJ SOLR
2.0000 mg | Freq: Once | INTRAMUSCULAR | Status: AC | PRN
Start: 2016-07-07 — End: 2016-07-07
  Administered 2016-07-07: 2 mg

## 2016-07-07 MED ORDER — HEPARIN SOD (PORK) LOCK FLUSH 100 UNIT/ML IV SOLN
500.0000 [IU] | Freq: Once | INTRAVENOUS | Status: AC | PRN
  Administered 2016-07-07: 500 [IU]
  Filled 2016-07-07: qty 5

## 2016-07-07 MED ORDER — ACETAMINOPHEN 325 MG PO TABS
650.0000 mg | ORAL_TABLET | Freq: Once | ORAL | Status: AC
  Administered 2016-07-07: 650 mg via ORAL
  Filled 2016-07-07: qty 2

## 2016-07-07 MED ORDER — STERILE WATER FOR INJECTION IJ SOLN
INTRAMUSCULAR | Status: AC
  Filled 2016-07-07: qty 10

## 2016-07-07 MED ORDER — PEGFILGRASTIM 6 MG/0.6ML ~~LOC~~ PSKT
6.0000 mg | PREFILLED_SYRINGE | Freq: Once | SUBCUTANEOUS | Status: AC
  Administered 2016-07-07: 6 mg via SUBCUTANEOUS
  Filled 2016-07-07: qty 0.6

## 2016-07-07 MED ORDER — DIPHENHYDRAMINE HCL 25 MG PO CAPS
50.0000 mg | ORAL_CAPSULE | Freq: Once | ORAL | Status: AC
  Administered 2016-07-07: 50 mg via ORAL
  Filled 2016-07-07: qty 2

## 2016-07-07 MED ORDER — SODIUM CHLORIDE 0.9 % IV SOLN
Freq: Once | INTRAVENOUS | Status: AC
  Administered 2016-07-07: 10:00:00 via INTRAVENOUS

## 2016-07-07 MED ORDER — SODIUM CHLORIDE 0.9% FLUSH
10.0000 mL | INTRAVENOUS | Status: DC | PRN
  Administered 2016-07-07: 10 mL
  Filled 2016-07-07: qty 10

## 2016-07-07 MED ORDER — PALONOSETRON HCL INJECTION 0.25 MG/5ML
0.2500 mg | Freq: Once | INTRAVENOUS | Status: AC
  Administered 2016-07-07: 0.25 mg via INTRAVENOUS
  Filled 2016-07-07: qty 5

## 2016-07-07 MED ORDER — VINCRISTINE SULFATE CHEMO INJECTION 1 MG/ML
1.0000 mg | Freq: Once | INTRAVENOUS | Status: AC
  Administered 2016-07-07: 1 mg via INTRAVENOUS
  Filled 2016-07-07: qty 1

## 2016-07-07 MED ORDER — ALTEPLASE 2 MG IJ SOLR
INTRAMUSCULAR | Status: AC
  Filled 2016-07-07: qty 2

## 2016-07-07 MED ORDER — POTASSIUM CHLORIDE CRYS ER 20 MEQ PO TBCR: 20.0000 meq | EXTENDED_RELEASE_TABLET | Freq: Every day | ORAL | 0 refills | Status: DC

## 2016-07-07 MED ORDER — RITUXIMAB CHEMO INJECTION 500 MG/50ML
375.0000 mg/m2 | Freq: Once | INTRAVENOUS | Status: AC
  Administered 2016-07-07: 900 mg via INTRAVENOUS
  Filled 2016-07-07: qty 40

## 2016-07-07 NOTE — Patient Instructions (Signed)
[place] at [facility] Discharge Instructions  RECOMMENDATIONS MADE BY THE CONSULTANT AND ANY TEST RESULTS WILL BE SENT TO YOUR REFERRING PHYSICIAN.  You were seen Today by [name] PA-C. Rx given for Xanax. Return in 3 weeks for follow up and treatment.   Thank you for choosing [place] at [facility] to provide your oncology and hematology care.  To afford each patient quality time with our provider, please arrive at least 15 minutes before your scheduled appointment time.   Beginning January 23rd 2017 lab work for the [facility] will be done in the  Main lab at [facility] on 1st floor. If you have a lab appointment with the [facility] please come in thru the  Main Entrance and check in at the main information desk  You need to re-schedule your appointment should you arrive 10 or more minutes late.  We strive to give you quality time with our providers, and arriving late affects you and other patients whose appointments are after yours.  Also, if you no show three or more times for appointments you may be dismissed from the clinic at the providers discretion.     Again, thank you for choosing [facility].  Our hope is that these requests will decrease the amount of time that you wait before being seen by our physicians.       _____________________________________________________________  Should you have questions after your visit to [facility], please contact our office at [phone] between the hours of 8:30 a.m. and 4:30 p.m.  Voicemails left after 4:30 p.m. will not be returned until the following business day.  For prescription refill requests, have your pharmacy contact our office.         Resources For Cancer Patients and their Caregivers ? American Cancer Society: Can assist with transportation, wigs, general needs, runs Look Good Feel Better.        231-156-3204 ? Cancer  Care: Provides financial assistance, online support groups, medication/co-pay assistance.  1-800-813-HOPE 484 831 1015) ? [place] Assists [place] cancer patients and their families through emotional , educational and financial support.  607-669-3609 ? [place] DSS Where to apply for food stamps, Medicaid and utility assistance. 6163715420 ? RCATS: Transportation to medical appointments. 469-874-0761 ? Social Security Administration: May apply for disability if have a Stage IV cancer. (515)313-7845 314-878-2184 ? [employer], Disability and Transit Services: Assists with nutrition, care and transit needs. [place] Support Programs: @10RELATIVEDAYS @ > Cancer Support Group  2nd Tuesday of the month 1pm-2pm, Journey Room  > Creative Journey  3rd Tuesday of the month 1130am-1pm, Journey Room  > Look Good Feel Better  1st Wednesday of the month 10am-12 noon, Journey Room (Call [place] to register [phone])

## 2016-07-07 NOTE — Progress Notes (Signed)
Accessed port, no blood return present, no resistance met when flushing port. Altaplase administered, see mar.  0955-Pulled back 11m of blood return after altaplase administration.   Chemotherapy given today per orders. Patient tolerated well without problems today. Neulast on pro administered, instructions given to patient that medicine would be given between 6-7pm.  Vitals stable and discharged home ambulatory with family present.

## 2016-07-07 NOTE — Patient Instructions (Signed)
[facility] Discharge Instructions for Patients Receiving Chemotherapy   Beginning January 23rd 2017 lab work for the Cancer Center will be done in the  Main lab at [facility] on 1st floor. If you have a lab appointment with the Cancer Center please come in thru the  Main Entrance and check in at the main information desk   Today you received the following chemotherapy agents [facility]  To help prevent nausea and vomiting after your treatment, we encourage you to take your nausea medication   If you develop nausea and vomiting, or diarrhea that is not controlled by your medication, call the clinic.  The clinic phone number is [phone]. Office hours are Monday-Friday 8:30am-5:00pm.  BELOW ARE SYMPTOMS THAT SHOULD BE REPORTED IMMEDIATELY:  *FEVER GREATER THAN 101.0 F  *CHILLS WITH OR WITHOUT FEVER  NAUSEA AND VOMITING THAT IS NOT CONTROLLED WITH YOUR NAUSEA MEDICATION  *UNUSUAL SHORTNESS OF BREATH  *UNUSUAL BRUISING OR BLEEDING  TENDERNESS IN MOUTH AND THROAT WITH OR WITHOUT PRESENCE OF ULCERS  *URINARY PROBLEMS  *BOWEL PROBLEMS  UNUSUAL RASH Items with * indicate a potential emergency and should be followed up as soon as possible. If you have an emergency after office hours please contact your primary care physician or go to the nearest emergency department.  Please call the clinic during office hours if you have any questions or concerns.   You may also contact the Patient Navigator at [phone] should you have any questions or need assistance in obtaining follow up care.      Resources For Cancer Patients and their Caregivers ? American Cancer Society: Can assist with transportation, wigs, general needs, runs Look Good Feel Better.        1-888-227-6333 ? Cancer Care: Provides financial assistance, online support groups, medication/co-pay assistance.  1-800-813-HOPE (4673) ? [facility] Assists [place] cancer  patients and their families through emotional , educational and financial support.  336-427-4357 ? [place] DSS Where to apply for food stamps, Medicaid and utility assistance. 336-342-1394 ? RCATS: Transportation to medical appointments. 336-347-2287 ? Social Security Administration: May apply for disability if have a Stage IV cancer. 336-342-7796 1-800-772-1213 ? [place] Aging, Disability and Transit Services: Assists with nutrition, care and transit needs. 336-349-2343          

## 2016-07-07 NOTE — Progress Notes (Signed)
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  INTERVAL HISTORY: [name] 30 y.o. 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 Her case has been discussed with [facility] and based upon guidelines, reading, and [facility] consultation RCHOP is being recommended.     Hodgkin lymphoma, nodular lymphocyte predominance ([place])   04/07/2016 Procedure    Excisional lymph node biopsy of right thigh      04/11/2016 Pathology Results    Nodular lymphocyte predominant hodgkin Lymphoma      04/23/2016 Miscellaneous    Normal ESR, LDH      04/25/2016 PET scan    1. Hypermetabolic enlarged bilateral axillary, bilateral external iliac and bilateral inguinal lymph nodes consistent with lymphoma. 2. Hypermetabolic foci of skin thickening in the bilateral axilla and bilateral lower ventral chest wall, nonspecific, cannot exclude cutaneous lymphoma. 3. Hypermetabolism throughout Waldeyer's ring with associated mucosal thickening on the CT images, which could be inflammatory or due to lymphoma. 4. No splenic enlargement or hypermetabolism. 5. Diffuse hypermetabolism throughout the axial skeleton without discrete bone lesions on the CT images, worrisome for diffuse osseous involvement by lymphoma.      04/29/2016 Imaging    MUGA- Left ventricular ejection fraction equals 66 %.      05/05/2016 -  Chemotherapy    The patient had DOXOrubicin (ADRIAMYCIN) chemo injection 116 mg, 50 mg/m2 = 116 mg, Intravenous,  Once, 3  of 6 cycles  palonosetron (ALOXI) injection 0.25 mg, 0.25 mg, Intravenous,  Once, 3 of 6 cycles  pegfilgrastim (NEULASTA ONPRO KIT) injection 6 mg, 6 mg, Subcutaneous, Once, 3 of 6 cycles  vinCRIStine (ONCOVIN) 2 mg in sodium chloride 0.9 % 50 mL chemo infusion, 2 mg, Intravenous,  Once, 3 of 6 cycles Dose modification: 1 mg (original dose 2 mg, Cycle 3, Reason: Dose not tolerated, Comment: neuropathy)  riTUXimab (RITUXAN) 900 mg in sodium chloride 0.9 % 250 mL (2.6471 mg/mL) chemo infusion, 375 mg/m2 = 900 mg, Intravenous,  Once, 3 of 6 cycles  cyclophosphamide (CYTOXAN) 1,740 mg in sodium chloride 0.9 % 250 mL chemo infusion, 750 mg/m2 = 1,740 mg, Intravenous,  Once, 3 of 6 cycles  for chemotherapy treatment.        07/02/2016 Procedure    Flexible cystoscopy by Dr. Alyson Ingles (Urology) discovering a 1 cm lesion with clot attached on the posterior wall of bladder.      07/04/2016 PET scan    1. Interval improvement in hypermetabolic nodal activity within the axilla, pelvis and inguinal regions bilaterally consistent with positive response to therapy. Deauville stage III. 2. Interval improvement in the previous demonstrated multifocal subcutaneous activity. There is residual prominent activity in the anterior left perineal region which could be inflammatory.  There is no focal hypermetabolic activity to suggest osseous metastatic disease. Mildly prominent marrow activity is again noted throughout the axial and proximal appendicular skeleton.       She reports increased fatigue and shortness of breath since her last cycle.  She notes a lack of improvement in these symptoms like she typically experiences 1  week prior to treatment.  She denies any chest pain or hemoptysis.  She notes that her SOB was progressive and not sudden in nature.  She denies a cough.    She notes fatigue and tiredness.  She is educated on chemotherapy-induced fatigue.  She notes bone pain with Neulasta and  therefore, she is concerned about this injection.  She is educated on the role of this injection in her treatment plan.  She notes low back, back spasms.  This occurred during her PET scan.  She thinks it is secondary to anxiety.  I have refilled her Xanax Rx.  She has seen Dr. Alyson Ingles for cystoscopy.  This was performed on 07/02/2016.  He noted a 1 cm lesion with clot attached on the posterior wall of bladder.  She reports that the plan is for a biopsy in the future of this area.  She notes a goal to "sue" her Gyn for ongoing vaginal bleeding.  She relays the story that she had an appointment with her gynecologist but was 10 minutes late, resulting in the rescheduling of this appointment.  Unfortunately, due to this reschedule, she presented to the ED which led to CT of abd to evaluate for renal stone.  This imaging led to the discovery of her lymphadenopathy, which led to the diagnosis and now treatment.  The patient tends to think that her situation would have been different if she was seen the day of her appointment by her gynecologist.    Review of Systems  Constitutional: Positive for malaise/fatigue. Negative for chills and fever.  HENT: Negative.   Eyes: Negative.   Respiratory: Positive for shortness of breath. Negative for cough, hemoptysis and wheezing.   Cardiovascular: Negative for chest pain.  Gastrointestinal: Negative.  Negative for constipation, diarrhea, nausea and vomiting.  Musculoskeletal: Negative.   Skin: Negative.   Neurological: Positive for weakness.  Endo/Heme/Allergies: Negative.   Psychiatric/Behavioral: Positive for depression. The patient is nervous/anxious.     Past Medical History:  Diagnosis Date  . Asthma    had since childhood; has not had to use inhaler or nebulizer for 8-9 years. Been doing well.  Marland Kitchen Headache(784.0)    usually controlled c Tylenol when not pregnant, but more painful headaches  during preg. due to hypertension.  . Hidradenitis  suppurativa   . Hodgkin lymphoma, nodular lymphocyte predominance ([place]) 04/15/2016  . Hypertension December 2011   Been on Aldomet since Dec. 2011  . Sleep apnea     Past Surgical History:  Procedure Laterality Date  . ADENOIDECTOMY     age 37  . BREAST RECONSTRUCTION  December 2001   breast reduction at age 59  . MASS EXCISION Right 04/07/2016   Procedure: EXCISION/BIOPSY SOFT TISSUE MASS RIGHT THIGH;  Surgeon: Aviva Signs, MD;  Location: [facility];  Service: General;  Laterality: Right;  . port a cath placed    . PORTACATH PLACEMENT Left 04/28/2016   Procedure: INSERTION PORT-A-CATH;  Surgeon: Aviva Signs, MD;  Location: [facility];  Service: General;  Laterality: Left;    Family History  Problem Relation Age of Onset  . Hypertension Mother   . Cancer Mother   . Hypertension Father   . Heart disease Maternal Grandmother     Social History   Social History  . Marital status: Married    Spouse name: N/A  . Number of children: N/A  . Years of education: N/A   Social History Main Topics  . Smoking status: Never Smoker  . Smokeless  tobacco: Never Used  . Alcohol use No  . Drug use: No  . Sexual activity: Yes    Birth control/ protection: IUD   Other Topics Concern  . None   Social History Narrative  . None     PHYSICAL EXAMINATION  ECOG PERFORMANCE STATUS: 0 - Asymptomatic  There were no vitals filed for this visit.  Vitals - 1 value per visit 31/54/0086  SYSTOLIC 761  DIASTOLIC 60  Pulse 950  Temperature 98  Respirations 18  Weight (lb) 279.4    GENERAL:alert, well nourished, well developed, comfortable, cooperative, obese, not smiling, accompanied by family members, in chemo-bed. SKIN: skin color, texture, turgor are normal, skin lesion on right anterior lower arm, see below. HEAD: Normocephalic, No masses, lesions, tenderness or abnormalities EYES: normal, EOMI, Conjunctiva are pink and non-injected EARS: External ears normal OROPHARYNX:lips, buccal  mucosa, and tongue normal and mucous membranes are moist  NECK: supple, no adenopathy, trachea midline LYMPH:  no palpable lymphadenopathy BREAST:not examined LUNGS: clear to auscultation and percussion HEART: regular rate & rhythm, no murmurs, no gallops, S1 normal and S2 normal ABDOMEN:abdomen soft, obese and normal bowel sounds BACK: Back symmetric, no curvature. EXTREMITIES:less then 2 second capillary refill, no joint deformities, effusion, or inflammation, no skin discoloration, no cyanosis.  Right anterior forearm skin lesion as below:     NEURO: alert & oriented x 3 with fluent speech, no focal motor/sensory deficits, gait normal   LABORATORY DATA: CBC    Component Value Date/Time   WBC 8.2 07/07/2016 0914   RBC 4.22 07/07/2016 0914   HGB 11.4 (L) 07/07/2016 0914   HCT 35.2 (L) 07/07/2016 0914   PLT 427 (H) 07/07/2016 0914   MCV 83.4 07/07/2016 0914   MCH 27.0 07/07/2016 0914   MCHC 32.4 07/07/2016 0914   RDW 15.7 (H) 07/07/2016 0914   LYMPHSABS 1.2 07/07/2016 0914   MONOABS 1.0 07/07/2016 0914   EOSABS 0.1 07/07/2016 0914   BASOSABS 0.0 07/07/2016 0914      Chemistry      Component Value Date/Time   NA 136 07/07/2016 0914   K 3.3 (L) 07/07/2016 0914   CL 104 07/07/2016 0914   CO2 25 07/07/2016 0914   BUN 8 07/07/2016 0914   CREATININE 0.61 07/07/2016 0914   CREATININE 0.78 02/16/2013 1721      Component Value Date/Time   CALCIUM 9.0 07/07/2016 0914   ALKPHOS 36 (L) 07/07/2016 0914   AST 15 07/07/2016 0914   ALT 20 07/07/2016 0914   BILITOT 0.3 07/07/2016 0914        PENDING LABS:   RADIOGRAPHIC STUDIES:  Nm Pet Image Restag (ps) Skull Base To Thigh  Result Date: 07/07/2016 CLINICAL DATA:  Subsequent treatment strategy for Hodgkin's lymphoma. Chemotherapy ongoing. EXAM: NUCLEAR MEDICINE PET SKULL BASE TO THIGH TECHNIQUE: 13.7 mCi F-18 FDG was injected intravenously. Full-ring PET imaging was performed from the skull base to thigh after the  radiotracer. CT data was obtained and used for attenuation correction and anatomic localization. FASTING BLOOD GLUCOSE:  Value: 86 mg/dl COMPARISON:  Abdominal pelvic CT 05/11/2016.  PET-CT 04/25/2016. FINDINGS: Interpretation of this examination was delayed as it was not made available for dictation in PACS until 07/07/2016. NECK No hypermetabolic cervical lymph nodes are identified.Mild residual activity within Waldeyer's ring and muscles of phonation, within physiologic limits. No focal lesions of the pharyngeal mucosal space identified. CHEST Interval improvement in previously demonstrated hypermetabolic axillary adenopathy bilaterally. The largest remaining right axillary node measures 13 mm  on image 56 (SUV max 2.3, previously 6.7). The largest remaining left axillary node measures 12 mm on image 66 (SUV max 3.3, previously 7.2). There are no hypermetabolic mediastinal or axillary lymph nodes. There is no suspicious pulmonary activity. The lungs are clear. Left subclavian Port-A-Cath extends to the mid SVC. ABDOMEN/PELVIS There is no hypermetabolic activity within the liver, adrenal glands, spleen or pancreas. There are no hypermetabolic abdominal lymph nodes. The previously demonstrated hypermetabolic pelvic and inguinal lymph nodes have improved. 1.3 cm right external iliac node on image 178 has an SUV max of 4.0 (previously 2.3 cm, SUV max 13.9). 10 mm left external iliac node on image 172 has an SUV max of 3.5 (previously 14 mm, SUV max 6.3). The inguinal nodes have also improved with the remaining greatest hypermetabolic activity on the right (SUV max 4.5). Fluid collection in the right groin again noted, measuring 4.3 x 3.5 cm. There is some hypermetabolic activity within the subcutaneous tissues of the low anterior left perineal region (SUV max 16.4), similar to previous study. Hepatic steatosis and IUD noted. SKELETON There is no focal hypermetabolic activity to suggest osseous metastatic disease.  Mildly prominent marrow activity is again noted throughout the axial and proximal appendicular skeleton. The previously demonstrated dermal lesions in the chest appear improved. Residual right axillary subcutaneous activity has an SUV max of 4.1 (previously 9.1). No new or worsening subcutaneous lesions identified. IMPRESSION: 1. Interval improvement in hypermetabolic nodal activity within the axilla, pelvis and inguinal regions bilaterally consistent with positive response to therapy. Deauville stage III. 2. Interval improvement in the previous demonstrated multifocal subcutaneous activity. There is residual prominent activity in the anterior left perineal region which could be inflammatory. 3. No suspicious residual metabolic activity in Waldeyer's ring. Electronically Signed   By: Richardean Sale M.D.   On: 07/07/2016 11:04   Dg Hip Unilat With Pelvis 2-3 Views Left  Result Date: 06/17/2016 CLINICAL DATA:  Hip pain. EXAM: DG HIP (WITH OR WITHOUT PELVIS) 2-3V LEFT COMPARISON:  CT 05/11/2016. FINDINGS: No acute bony or joint abnormality identified. No evidence of fracture dislocation. IUD noted. Pelvic calcifications consistent phleboliths. IMPRESSION: No acute abnormality. Electronically Signed   By: Marcello Moores  Register   On: 06/17/2016 08:09     PATHOLOGY:    ASSESSMENT AND PLAN:  Hodgkin lymphoma, nodular lymphocyte predominance (H[facility]) Stage IV nodular lymphocyte predominant hodgkin lymphoma. Her case has been discussed with [facility] and based upon guidelines, reading, and [facility] consultation R[facility] is being recommended. She started R-CHOP with neulasta support on 05/05/2016.  Oncology history is updated.  Pre-treatment labs today: CBC diff, CMET.  I personally reviewed and went over laboratory results with the patient.  The results are noted within this dictation.  Hypokalemia is noted.  Kdur 20 mEq PO daily is escribed to her pharmacy.  Pre-treatment labs as ordered.  I personally reviewed and went  over radiographic studies with the patient.  The results are noted within this dictation.  PET imaging demonstrates an interval response to therapy.  I have refilled her Xanax for anxiety.    She is unsure of her appointment with Dr. Alyson Ingles for biopsy.  We will need to coordinate biopsy with her chemotherapy schedule.  She will continue with Claritin and Hydrocodone for Neulasta-induced bone pain.  If her SOB persists or progresses, she is to let us know.  Return as scheduled for treatment next week with labs.  Return in 3 weeks for follow-up appointment and next treatment.   ORDERS PLACED  FOR THIS ENCOUNTER: No orders of the defined types were placed in this encounter.   MEDICATIONS PRESCRIBED THIS ENCOUNTER: Meds ordered this encounter  Medications  . ALPRAZolam (XANAX) 0.5 MG tablet    Sig: Take 1 tablet (0.5 mg total) by mouth 3 (three) times daily as needed for anxiety.    Dispense:  30 tablet    Refill:  1    Order Specific Question:   Supervising Provider    Answer:   Patrici Ranks [id number]  . potassium chloride SA (K-DUR,KLOR-CON) 20 MEQ tablet    Sig: Take 1 tablet (20 mEq total) by mouth daily.    Dispense:  30 tablet    Refill:  0    Order Specific Question:   Supervising Provider    Answer:   Patrici Ranks [id number]    THERAPY PLAN:  Continue treatment as planned with curative intent.  All questions were answered. The patient knows to call the clinic with any problems, questions or concerns. We can certainly see the patient much sooner if necessary.  Patient and plan discussed with Dr. Ancil Linsey and she is in agreement with the aforementioned.   This note is electronically signed by: Doy Mince 07/07/2016 5:57 PM

## 2016-07-07 NOTE — Assessment & Plan Note (Addendum)
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 Her case has been discussed with [facility] and based upon guidelines, reading, and [facility] consultation R[facility] is being recommended. She started R-CHOP with neulasta support on 05/05/2016.  Oncology history is updated.  Pre-treatment labs today: CBC diff, CMET.  I personally reviewed and went over laboratory results with the patient.  The results are noted within this dictation.  Hypokalemia is noted.  Kdur 20 mEq PO daily is escribed to her pharmacy.  Pre-treatment labs as ordered.  I personally reviewed and went over radiographic studies with the patient.  The results are noted within this dictation.  PET imaging demonstrates an interval response to therapy.  I have refilled her Xanax for anxiety.    She is unsure of her appointment with Dr. Alyson Ingles for biopsy.  We will need to coordinate biopsy with her chemotherapy schedule.  She will continue with Claritin and Hydrocodone for Neulasta-induced bone pain.  If her SOB persists or progresses, she is to let us know.  Return as scheduled for treatment next week with labs.  Return in 3 weeks for follow-up appointment and next treatment.

## 2016-07-08 ENCOUNTER — Telehealth (HOSPITAL_COMMUNITY): Payer: Self-pay | Admitting: Emergency Medicine

## 2016-07-08 ENCOUNTER — Telehealth (HOSPITAL_COMMUNITY): Payer: Self-pay

## 2016-07-08 NOTE — Telephone Encounter (Signed)
Notified patient about potassium. She verbalized understanding.

## 2016-07-08 NOTE — Telephone Encounter (Signed)
-----   Message from Baird [facility], PA-C sent at 07/07/2016  5:51 PM EST ----- I have escribed Kdur 20 mEq daily.  Please advise her.  TK

## 2016-07-08 NOTE — Telephone Encounter (Signed)
Pt called and had questions about when she could start taking the zofran?  Its states day 3 and she wants to know when exactly that was.  I explained that Day 1 would be the day she was treated so Monday and day 3 would be Wednesday.  She verbalized understanding.

## 2016-07-09 ENCOUNTER — Other Ambulatory Visit: Payer: Self-pay | Admitting: Radiology

## 2016-07-09 ENCOUNTER — Telehealth: Payer: Self-pay | Admitting: Radiology

## 2016-07-09 DIAGNOSIS — N329 Bladder disorder, unspecified: Secondary | ICD-10-CM

## 2016-07-09 NOTE — Telephone Encounter (Signed)
Notified pt of surgery scheduled with Dr Alyson Ingles at [facility] on 07/23/16, pre-op appt at [facility] on 07/21/16 @1 :48 & post op appt with Dr Alyson Ingles at [place] office on 07/30/16 @9 :85. Pt voices understanding.

## 2016-07-18 NOTE — Patient Instructions (Signed)
[name]  07/18/2016     @PREFPERIOPPHARMACY @   Your procedure is scheduled on  07/23/2016   Report to [facility] at  1150  A.M.  Call this number if you have problems the morning of surgery:  [phone]   Remember:  Do not eat food or drink liquids after midnight.  Take these medicines the morning of surgery with A SIP OF WATER  Allopurinol, xanax, amlodipine, hydrocodone, zofran, pyridium, deltasone.   Do not wear jewelry, make-up or nail polish.  Do not wear lotions, powders, or perfumes, or deoderant.  Do not shave 48 hours prior to surgery.  Men may shave face and neck.  Do not bring valuables to the hospital.  [facility] is not responsible for any belongings or valuables.  Contacts, dentures or bridgework may not be worn into surgery.  Leave your suitcase in the car.  After surgery it may be brought to your room.  For patients admitted to the hospital, discharge time will be determined by your treatment team.  Patients discharged the day of surgery will not be allowed to drive home.   Name and phone number of your driver:   family Special instructions:  none  Please read over the following fact sheets that you were given. Anesthesia Post-op Instructions and Care and Recovery After Surgery       Cystoscopy Cystoscopy is a procedure that is used to help diagnose and sometimes treat conditions that affect that lower urinary tract. The lower urinary tract includes the bladder and the tube that drains urine from the bladder out of the body (urethra). Cystoscopy is performed with a thin, tube-shaped instrument with a light and camera at the end (cystoscope). The cystoscope may be hard (rigid) or flexible, depending on the goal of the procedure.The cystoscope is inserted through the urethra, into the bladder. Cystoscopy may be recommended if you have:  Urinary tractinfections that keep coming back (recurring).  Blood in the urine  (hematuria).  Loss of bladder control (urinary incontinence) or an overactive bladder.  Unusual cells found in a urine sample.  A blockage in the urethra.  Painful urination.  An abnormality in the bladder found during an intravenous pyelogram (IVP) or CT scan. Cystoscopy may also be done to remove a sample of tissue to be examined under a microscope (biopsy). Tell a health care provider about:  Any allergies you have.  All medicines you are taking, including vitamins, herbs, eye drops, creams, and over-the-counter medicines.  Any problems you or family members have had with anesthetic medicines.  Any blood disorders you have.  Any surgeries you have had.  Any medical conditions you have.  Whether you are pregnant or may be pregnant. What are the risks? Generally, this is a safe procedure. However, problems may occur, including:  Infection.  Bleeding.  Allergic reactions to medicines.  Damage to other structures or organs. What happens before the procedure?  Ask your health care provider about:  Changing or stopping your regular medicines. This is especially important if you are taking diabetes medicines or blood thinners.  Taking medicines such as aspirin and ibuprofen. These medicines can thin your blood. Do not take these medicines before your procedure if your health care provider instructs you not to.  Follow instructions from your health care provider about eating or drinking restrictions.  You may be given antibiotic medicine to help prevent infection.  You may have an exam or testing, such  as X-rays of the bladder, urethra, or kidneys.  You may have urine tests to check for signs of infection.  Plan to have someone take you home after the procedure. What happens during the procedure?  To reduce your risk of infection,your health care team will wash or sanitize their hands.  You will be given one or more of the following:  A medicine to help you  relax (sedative).  A medicine to numb the area (local anesthetic).  The area around the opening of your urethra will be cleaned.  The cystoscope will be passed through your urethra into your bladder.  Germ-free (sterile)fluid will flow through the cystoscope to fill your bladder. The fluid will stretch your bladder so that your surgeon can clearly examine your bladder walls.  The cystoscope will be removed and your bladder will be emptied. The procedure may vary among health care providers and hospitals. What happens after the procedure?  You may have some soreness or pain in your abdomen and urethra. Medicines will be available to help you.  You may have some blood in your urine.  Do not drive for 24 hours if you received a sedative. This information is not intended to replace advice given to you by your health care provider. Make sure you discuss any questions you have with your health care provider. Document Released: 08/01/2000 Document Revised: 12/13/2015 Document Reviewed: 06/21/2015 Elsevier Interactive Patient Education  2017 [place].  Cystoscopy, Care After Refer to this sheet in the next few weeks. These instructions provide you with information about caring for yourself after your procedure. Your health care provider may also give you more specific instructions. Your treatment has been planned according to current medical practices, but problems sometimes occur. Call your health care provider if you have any problems or questions after your procedure. What can I expect after the procedure? After the procedure, it is common to have:  Mild pain when you urinate. Pain should stop within a few minutes after you urinate. This may last for up to 1 week.  A small amount of blood in your urine for several days.  Feeling like you need to urinate but producing only a small amount of urine. Follow these instructions at home:   Medicines  Take over-the-counter and  prescription medicines only as told by your health care provider.  If you were prescribed an antibiotic medicine, take it as told by your health care provider. Do not stop taking the antibiotic even if you start to feel better. General instructions  Return to your normal activities as told by your health care provider. Ask your health care provider what activities are safe for you.  Do not drive for 24 hours if you received a sedative.  Watch for any blood in your urine. If the amount of blood in your urine increases, call your health care provider.  Follow instructions from your health care provider about eating or drinking restrictions.  If a tissue sample was removed for testing (biopsy) during your procedure, it is your responsibility to get your test results. Ask your health care provider or the department performing the test when your results will be ready.  Drink enough fluid to keep your urine clear or pale yellow.  Keep all follow-up visits as told by your health care provider. This is important. Contact a health care provider if:  You have pain that gets worse or does not get better with medicine, especially pain when you urinate.  You have difficulty  urinating. Get help right away if:  You have more blood in your urine.  You have blood clots in your urine.  You have abdominal pain.  You have a fever or chills.  You are unable to urinate. This information is not intended to replace advice given to you by your health care provider. Make sure you discuss any questions you have with your health care provider. Document Released: 02/21/2005 Document Revised: 01/10/2016 Document Reviewed: 06/21/2015 Elsevier Interactive Patient Education  2017 [employer] PATIENT INSTRUCTIONS POST-ANESTHESIA  IMMEDIATELY FOLLOWING SURGERY:  Do not drive or operate machinery for the first twenty four hours after surgery.  Do not make any important decisions for twenty four hours after  surgery or while taking narcotic pain medications or sedatives.  If you develop intractable nausea and vomiting or a severe headache please notify your doctor immediately.  FOLLOW-UP:  Please make an appointment with your surgeon as instructed. You do not need to follow up with anesthesia unless specifically instructed to do so.  WOUND CARE INSTRUCTIONS (if applicable):  Keep a dry clean dressing on the anesthesia/puncture wound site if there is drainage.  Once the wound has quit draining you may leave it open to air.  Generally you should leave the bandage intact for twenty four hours unless there is drainage.  If the epidural site drains for more than 36-48 hours please call the anesthesia department.  QUESTIONS?:  Please feel free to call your physician or the hospital operator if you have any questions, and they will be happy to assist you.

## 2016-07-21 ENCOUNTER — Other Ambulatory Visit: Payer: Self-pay

## 2016-07-21 ENCOUNTER — Encounter (HOSPITAL_COMMUNITY): Payer: Self-pay

## 2016-07-21 ENCOUNTER — Encounter (HOSPITAL_COMMUNITY)
Admission: RE | Admit: 2016-07-21 | Discharge: 2016-07-21 | Disposition: A | Discharge status: Home / Self Care | Payer: 59 | Source: Ambulatory Visit | Attending: Urology | Admitting: Urology

## 2016-07-21 DIAGNOSIS — Z01812 Encounter for preprocedural laboratory examination: Secondary | ICD-10-CM

## 2016-07-21 DIAGNOSIS — Z79899 Other long term (current) drug therapy: Secondary | ICD-10-CM | POA: Diagnosis not present

## 2016-07-21 DIAGNOSIS — C8100 Nodular lymphocyte predominant Hodgkin lymphoma, unspecified site: Secondary | ICD-10-CM | POA: Diagnosis not present

## 2016-07-21 DIAGNOSIS — N329 Bladder disorder, unspecified: Secondary | ICD-10-CM | POA: Diagnosis present

## 2016-07-21 DIAGNOSIS — G4730 Sleep apnea, unspecified: Secondary | ICD-10-CM | POA: Diagnosis not present

## 2016-07-21 DIAGNOSIS — I10 Essential (primary) hypertension: Secondary | ICD-10-CM | POA: Diagnosis not present

## 2016-07-21 DIAGNOSIS — Z6841 Body Mass Index (BMI) 40.0 and over, adult: Secondary | ICD-10-CM | POA: Diagnosis not present

## 2016-07-21 DIAGNOSIS — F419 Anxiety disorder, unspecified: Secondary | ICD-10-CM | POA: Diagnosis not present

## 2016-07-21 DIAGNOSIS — N3021 Other chronic cystitis with hematuria: Secondary | ICD-10-CM | POA: Diagnosis not present

## 2016-07-21 DIAGNOSIS — Z01818 Encounter for other preprocedural examination: Secondary | ICD-10-CM | POA: Insufficient documentation

## 2016-07-21 HISTORY — DX: Anxiety disorder, unspecified: F41.9

## 2016-07-21 HISTORY — DX: Dyspnea, unspecified: R06.00

## 2016-07-21 HISTORY — DX: Hodgkin lymphoma, unspecified, unspecified site: C81.90

## 2016-07-21 LAB — CBC WITH DIFFERENTIAL/PLATELET
Basophils Absolute: 0 10*3/uL (ref 0.0–0.1)
Basophils Relative: 1 %
EOS ABS: 0.2 10*3/uL (ref 0.0–0.7)
EOS PCT: 4 %
HCT: 34.6 % — ABNORMAL LOW (ref 36.0–46.0)
Hemoglobin: 11.2 g/dL — ABNORMAL LOW (ref 12.0–15.0)
LYMPHS ABS: 1.1 10*3/uL (ref 0.7–4.0)
LYMPHS PCT: 17 %
MCH: 27.5 pg (ref 26.0–34.0)
MCHC: 32.4 g/dL (ref 30.0–36.0)
MCV: 84.8 fL (ref 78.0–100.0)
MONO ABS: 1.2 10*3/uL — AB (ref 0.1–1.0)
MONOS PCT: 18 %
Neutro Abs: 4.2 10*3/uL (ref 1.7–7.7)
Neutrophils Relative %: 60 %
PLATELETS: 330 10*3/uL (ref 150–400)
RBC: 4.08 MIL/uL (ref 3.87–5.11)
RDW: 15.7 % — ABNORMAL HIGH (ref 11.5–15.5)
WBC: 6.8 10*3/uL (ref 4.0–10.5)

## 2016-07-21 LAB — BASIC METABOLIC PANEL
ANION GAP: 5 (ref 5–15)
BUN: 5 mg/dL — ABNORMAL LOW (ref 6–20)
CO2: 27 mmol/L (ref 22–32)
Calcium: 9.1 mg/dL (ref 8.9–10.3)
Chloride: 105 mmol/L (ref 101–111)
Creatinine, Ser: 0.6 mg/dL (ref 0.44–1.00)
GFR calc Af Amer: >60 mL/min (ref >60)
GLUCOSE: 149 mg/dL — AB (ref 65–99)
POTASSIUM: 3.4 mmol/L — AB (ref 3.5–5.1)
Sodium: 137 mmol/L (ref 135–145)

## 2016-07-22 NOTE — Progress Notes (Signed)
Pt called to come in for a urine pregnancy test.  Pt unable to come.  Will check HCG on arrival tomorrow.

## 2016-07-23 ENCOUNTER — Ambulatory Visit (HOSPITAL_COMMUNITY)
Admission: RE | Admit: 2016-07-23 | Discharge: 2016-07-23 | Disposition: A | Discharge status: Home / Self Care | Payer: 59 | Source: Ambulatory Visit | Attending: Urology | Admitting: Urology

## 2016-07-23 ENCOUNTER — Ambulatory Visit (HOSPITAL_COMMUNITY): Payer: 59 | Admitting: Anesthesiology

## 2016-07-23 ENCOUNTER — Encounter (HOSPITAL_COMMUNITY)
Admission: RE | Disposition: A | Discharge status: Home / Self Care | Payer: Self-pay | Source: Ambulatory Visit | Attending: Urology

## 2016-07-23 ENCOUNTER — Encounter (HOSPITAL_COMMUNITY): Payer: Self-pay | Admitting: Anesthesiology

## 2016-07-23 DIAGNOSIS — Z6841 Body Mass Index (BMI) 40.0 and over, adult: Secondary | ICD-10-CM | POA: Insufficient documentation

## 2016-07-23 DIAGNOSIS — N3021 Other chronic cystitis with hematuria: Secondary | ICD-10-CM | POA: Insufficient documentation

## 2016-07-23 DIAGNOSIS — D494 Neoplasm of unspecified behavior of bladder: Secondary | ICD-10-CM | POA: Diagnosis not present

## 2016-07-23 DIAGNOSIS — C8100 Nodular lymphocyte predominant Hodgkin lymphoma, unspecified site: Secondary | ICD-10-CM | POA: Insufficient documentation

## 2016-07-23 DIAGNOSIS — F419 Anxiety disorder, unspecified: Secondary | ICD-10-CM | POA: Insufficient documentation

## 2016-07-23 DIAGNOSIS — C8108 Nodular lymphocyte predominant Hodgkin lymphoma, lymph nodes of multiple sites: Secondary | ICD-10-CM

## 2016-07-23 DIAGNOSIS — N329 Bladder disorder, unspecified: Secondary | ICD-10-CM

## 2016-07-23 DIAGNOSIS — I10 Essential (primary) hypertension: Secondary | ICD-10-CM | POA: Insufficient documentation

## 2016-07-23 DIAGNOSIS — M25552 Pain in left hip: Secondary | ICD-10-CM

## 2016-07-23 DIAGNOSIS — Z79899 Other long term (current) drug therapy: Secondary | ICD-10-CM | POA: Insufficient documentation

## 2016-07-23 DIAGNOSIS — G4730 Sleep apnea, unspecified: Secondary | ICD-10-CM | POA: Insufficient documentation

## 2016-07-23 HISTORY — PX: FULGURATION OF BLADDER TUMOR: SHX6261

## 2016-07-23 LAB — PREGNANCY, URINE: Preg Test, Ur: NEGATIVE

## 2016-07-23 SURGERY — FULGURATION, NEOPLASM, BLADDER
Anesthesia: General

## 2016-07-23 MED ORDER — FENTANYL CITRATE (PF) 100 MCG/2ML IJ SOLN
INTRAMUSCULAR | Status: AC
  Filled 2016-07-23: qty 2

## 2016-07-23 MED ORDER — LIDOCAINE HCL (CARDIAC) 20 MG/ML IV SOLN
INTRAVENOUS | Status: DC | PRN
  Administered 2016-07-23: 180 mg via INTRAVENOUS
  Administered 2016-07-23: 40 mg via INTRAVENOUS

## 2016-07-23 MED ORDER — PROPOFOL 10 MG/ML IV BOLUS
INTRAVENOUS | Status: AC
  Filled 2016-07-23: qty 20

## 2016-07-23 MED ORDER — GLYCOPYRROLATE 0.2 MG/ML IJ SOLN
0.2000 mg | Freq: Once | INTRAMUSCULAR | Status: AC
  Administered 2016-07-23: 0.2 mg via INTRAVENOUS

## 2016-07-23 MED ORDER — OXYCODONE-ACETAMINOPHEN 5-325 MG PO TABS: 1.0000 | ORAL_TABLET | ORAL | 0 refills | Status: DC | PRN

## 2016-07-23 MED ORDER — CEFAZOLIN IN D5W 1 GM/50ML IV SOLN
INTRAVENOUS | Status: AC
  Filled 2016-07-23: qty 50

## 2016-07-23 MED ORDER — FENTANYL CITRATE (PF) 100 MCG/2ML IJ SOLN: 25.0000 ug | INTRAMUSCULAR | Status: DC | PRN

## 2016-07-23 MED ORDER — FENTANYL CITRATE (PF) 100 MCG/2ML IJ SOLN
INTRAMUSCULAR | Status: DC | PRN
  Administered 2016-07-23: 25 ug via INTRAVENOUS
  Administered 2016-07-23: 50 ug via INTRAVENOUS
  Administered 2016-07-23: 25 ug via INTRAVENOUS

## 2016-07-23 MED ORDER — CEFAZOLIN IN D5W 1 GM/50ML IV SOLN
1.0000 g | INTRAVENOUS | Status: AC
  Administered 2016-07-23: 1 g via INTRAVENOUS

## 2016-07-23 MED ORDER — MIDAZOLAM HCL 2 MG/2ML IJ SOLN
INTRAMUSCULAR | Status: AC
  Filled 2016-07-23: qty 2

## 2016-07-23 MED ORDER — STERILE WATER FOR IRRIGATION IR SOLN
Status: DC | PRN
  Administered 2016-07-23: 3000 mL

## 2016-07-23 MED ORDER — LACTATED RINGERS IV SOLN
INTRAVENOUS | Status: DC
  Administered 2016-07-23: 13:00:00 via INTRAVENOUS

## 2016-07-23 MED ORDER — MIDAZOLAM HCL 2 MG/2ML IJ SOLN
1.0000 mg | INTRAMUSCULAR | Status: DC | PRN
  Administered 2016-07-23 (×2): 2 mg via INTRAVENOUS
  Filled 2016-07-23: qty 2

## 2016-07-23 MED ORDER — ONDANSETRON HCL 4 MG/2ML IJ SOLN
INTRAMUSCULAR | Status: AC
  Filled 2016-07-23: qty 2

## 2016-07-23 MED ORDER — GLYCOPYRROLATE 0.2 MG/ML IJ SOLN
INTRAMUSCULAR | Status: AC
  Filled 2016-07-23: qty 1

## 2016-07-23 MED ORDER — SODIUM CHLORIDE 0.9 % IR SOLN
Status: DC | PRN
  Administered 2016-07-23: 1000 mL

## 2016-07-23 MED ORDER — ONDANSETRON HCL 4 MG/2ML IJ SOLN
4.0000 mg | Freq: Once | INTRAMUSCULAR | Status: AC
  Administered 2016-07-23: 4 mg via INTRAVENOUS

## 2016-07-23 SURGICAL SUPPLY — 22 items
BAG DRAIN URO TABLE W/ADPT NS (DRAPE) ×3 IMPLANT
BAG DRN 8 ADPR NS SKTRN CSTL (DRAPE) ×2
BAG HAMPER (MISCELLANEOUS) ×3 IMPLANT
CATH INTERMIT  6FR 70CM (CATHETERS) ×1 IMPLANT
CLOTH BEACON ORANGE TIMEOUT ST (SAFETY) ×3 IMPLANT
DRSG TELFA 3X8 NADH (GAUZE/BANDAGES/DRESSINGS) ×3 IMPLANT
EXTRACTOR STONE NITINOL NGAGE (UROLOGICAL SUPPLIES) IMPLANT
GLOVE BIO SURGEON STRL SZ8 (GLOVE) ×3 IMPLANT
GOWN STRL REUS W/ TWL LRG LVL3 (GOWN DISPOSABLE) ×2 IMPLANT
GOWN STRL REUS W/ TWL XL LVL3 (GOWN DISPOSABLE) ×2 IMPLANT
GOWN STRL REUS W/TWL LRG LVL3 (GOWN DISPOSABLE) ×3
GOWN STRL REUS W/TWL XL LVL3 (GOWN DISPOSABLE) ×3
GUIDEWIRE STR DUAL SENSOR (WIRE) ×3 IMPLANT
GUIDEWIRE STR ZIPWIRE 035X150 (MISCELLANEOUS) ×1 IMPLANT
IV NS IRRIG 3000ML ARTHROMATIC (IV SOLUTION) ×3 IMPLANT
KIT ROOM TURNOVER AP CYSTO (KITS) ×3 IMPLANT
MANIFOLD NEPTUNE II (INSTRUMENTS) ×3 IMPLANT
PACK CYSTO (CUSTOM PROCEDURE TRAY) ×3 IMPLANT
PAD ARMBOARD 7.5X6 YLW CONV (MISCELLANEOUS) ×3 IMPLANT
PAD DRESSING TELFA 3X8 NADH (GAUZE/BANDAGES/DRESSINGS) IMPLANT
SYRINGE IRR TOOMEY STRL 70CC (SYRINGE) ×2 IMPLANT
WATER STERILE IRR 500ML POUR (IV SOLUTION) ×3 IMPLANT

## 2016-07-23 NOTE — Anesthesia Procedure Notes (Signed)
Procedure Name: LMA Insertion Date/Time: 07/23/2016 2:02 PM Performed by: Andree Elk, AMY A Pre-anesthesia Checklist: Patient identified, Timeout performed, Emergency Drugs available, Suction available and Patient being monitored Patient Re-evaluated:Patient Re-evaluated prior to inductionOxygen Delivery Method: Circle system utilized Preoxygenation: Pre-oxygenation with 100% oxygen Intubation Type: IV induction LMA: LMA inserted LMA Size: 4.0 Number of attempts: 1 Placement Confirmation: positive ETCO2 and breath sounds checked- equal and bilateral Tube secured with: Tape Dental Injury: Teeth and Oropharynx as per pre-operative assessment

## 2016-07-23 NOTE — H&P (Signed)
Urology Admission H&P  Chief Complaint: gross hematuria  History of Present Illness: [name] is a 30yo with a hx of Hodgkins lymphoma who developed gross hematuria. She was found to have a bladder lesion on office cystoscopy  Past Medical History:  Diagnosis Date  . Anxiety   . Asthma    had since childhood; has not had to use inhaler or nebulizer for 8-9 years. Been doing well.  Marland Kitchen Dyspnea   . Headache(784.0)    usually controlled c Tylenol when not pregnant, but more painful headaches  during preg. due to hypertension.  . Hidradenitis suppurativa   . 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 Hypertension December 2011   Been on [place] since Dec. 2011  . 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 Sleep apnea    Past Surgical History:  Procedure Laterality Date  . ADENOIDECTOMY     age 1  . BREAST RECONSTRUCTION  December 2001   breast reduction at age 23  . MASS EXCISION Right 04/07/2016   Procedure: EXCISION/BIOPSY SOFT TISSUE MASS RIGHT THIGH;  Surgeon: Aviva Signs, MD;  Location: [facility];  Service: General;  Laterality: Right;  . port a cath placed    . PORTACATH PLACEMENT Left 04/28/2016   Procedure: INSERTION PORT-A-CATH;  Surgeon: Aviva Signs, MD;  Location: [facility];  Service: General;  Laterality: Left;    Home Medications:  Prescriptions Prior to Admission  Medication Sig Dispense Refill Last Dose  . allopurinol (ZYLOPRIM) 300 MG tablet Take 1 tablet (300 mg total) by mouth daily. 30 tablet 3 07/23/2016 at Unknown time  . ALPRAZolam (XANAX) 0.5 MG tablet Take 1 tablet (0.5 mg total) by mouth 3 (three) times daily as needed for anxiety. 30 tablet 1 07/23/2016 at Unknown time  . amLODipine (NORVASC) 5 MG tablet Take 1 tablet (5 mg total) by mouth daily. 30 tablet 1 07/23/2016 at Unknown time  . CYCLOPHOSPHAMIDE IV Inject 1,740 mg into the vein every 21 ( twenty-one) days. Every 21 days    Taking  . DOXORUBICIN HCL IV Inject 116 mg into the vein  every 21 ( twenty-one) days. Every 21 days    Taking  . HYDROcodone-acetaminophen (NORCO) 5-325 MG tablet Take 1-2 tablets by mouth every 4 (four) hours as needed for moderate pain. 45 tablet 0 Past Week at Unknown time  . ibuprofen (ADVIL,MOTRIN) 200 MG tablet Take 400-600 mg by mouth every 6 (six) hours as needed for moderate pain.   Past Month at Unknown time  . levonorgestrel (MIRENA) 20 MCG/24HR IUD 1 each by Intrauterine route once.   07/23/2016 at Unknown time  . lidocaine-prilocaine (EMLA) cream Apply to affected area once (Patient taking differently: Apply 1 application topically daily as needed (prior to port access). Apply to affected area once) 30 g 3 Taking  . ondansetron (ZOFRAN) 8 MG tablet TAKE 1 TABLET BY MOUTH 2 TIMES DAILY AS NEEDED FOR REFRACTORY NAUSEA/VOMITING. START ON DAY 3 AFTER CYCLOPHOSAMIDE. 30 tablet 0 07/22/2016 at Unknown time  . Pegfilgrastim (NEULASTA ONPRO [place]) Inject 6 mg into the skin every 21 ( twenty-one) days. Every 21 days    Past Month at Unknown time  . potassium chloride SA (K-DUR,KLOR-CON) 20 MEQ tablet Take 1 tablet (20 mEq total) by mouth daily. 30 tablet 0 07/22/2016 at Unknown time  . predniSONE (DELTASONE) 20 MG tablet Take 4.5 tablets (90 mg total) by mouth daily. Take on days 1-5 of chemotherapy. 45 tablet 6 Past Month at  Unknown time  . prochlorperazine (COMPAZINE) 10 MG tablet Take 1 tablet (10 mg total) by mouth every 6 (six) hours as needed (Nausea or vomiting). 30 tablet 6 Taking  . RiTUXimab (RITUXAN IV) Inject 900 mg into the vein every 21 ( twenty-one) days. Every 21 days    Past Month at Unknown time  . temazepam (RESTORIL) 30 MG capsule Take 1 capsule (30 mg total) by mouth at bedtime. 30 capsule 1 Past Week at Unknown time  . VINCRISTINE SULFATE IV Inject 1 mg into the vein every 21 ( twenty-one) days. Every 21 days    Taking  . Vitamin D, Ergocalciferol, (DRISDOL) 50000 units CAPS capsule Take 50,000 Units by mouth every Monday. Takes on  Mondays.   07/22/2016 at Unknown time  . phenazopyridine (PYRIDIUM) 200 MG tablet Take 1 tablet (200 mg total) by mouth 3 (three) times daily. (Patient not taking: Reported on 07/16/2016) 6 tablet 0 Not Taking at Unknown time   Allergies: No Known Allergies  Family History  Problem Relation Age of Onset  . Hypertension Mother   . Cancer Mother   . Hypertension Father   . Heart disease Maternal Grandmother    Social History:  reports that she has never smoked. She has never used smokeless tobacco. She reports that she drinks about 0.6 oz of alcohol per week . She reports that she does not use drugs.  Review of Systems  Genitourinary: Positive for dysuria and hematuria.  All other systems reviewed and are negative.   Physical Exam:  Vital signs in last 24 hours: Temp:  [97.9 F (36.6 C)] 97.9 F (36.6 C) (12/06 1240) Pulse Rate:  [97] 97 (12/06 1240) Resp:  [18-20] 18 (12/06 1250) BP: (134)/(86) 134/86 (12/06 1240) SpO2:  [93 %-99 %] 99 % (12/06 1250) Physical Exam  Constitutional: She is oriented to person, place, and time. She appears well-developed and well-nourished.  HENT:  Head: Normocephalic and atraumatic.  Eyes: EOM are normal. Pupils are equal, round, and reactive to light.  Neck: Normal range of motion. No thyromegaly present.  Cardiovascular: Normal rate and regular rhythm.   Respiratory: Effort normal. No respiratory distress.  GI: Soft. She exhibits no distension.  Musculoskeletal: Normal range of motion. She exhibits no edema.  Neurological: She is alert and oriented to person, place, and time.  Skin: Skin is warm and dry.  Psychiatric: She has a normal mood and affect. Her behavior is normal. Judgment and thought content normal.    Laboratory Data:  Results for orders placed or performed during the hospital encounter of 07/23/16 (from the past 24 hour(s))  Pregnancy, urine     Status: None   Collection Time: 07/23/16 12:24 PM  Result Value Ref Range   Preg  Test, Ur NEGATIVE NEGATIVE   No results found for this or any previous visit (from the past 240 hour(s)). Creatinine:  Recent Labs  07/21/16 1513  CREATININE 0.60   Baseline Creatinine: 0.6  Impression/Assessment:  30yo with bladder lesions  Plan:  The risks/benefits/alternatives to bladder biopsy was explained to the patient and she understands and wishes to proceed with surgery  Nicolette Bang 07/23/2016, 1:17 PM

## 2016-07-23 NOTE — Discharge Instructions (Signed)
Hydrodistention of the Bladder, Care After Introduction Refer to this sheet in the next few weeks. These instructions provide you with information about caring for yourself after your procedure. Your health care provider may also give you more specific instructions. Your treatment has been planned according to current medical practices, but problems sometimes occur. Call your health care provider if you have any problems or questions after your procedure. What can I expect after the procedure? After the procedure, it is common to have:  Soreness and mild discomfort in your lower abdomen.  Mild pain when you urinate. Pain should stop within a few minutes after you urinate. This may last for up to a week.  A small amount of blood in your urine. Follow these instructions at home: Medicines  Take over-the-counter and prescription medicines only as told by your health care provider.  Do not drive for 24 hours if you received a sedative.  Do not drive or operate heavy machinery while taking prescription pain medicine.  If you were prescribed an antibiotic medicine, take it as told by your health care provider. Do not stop taking the antibiotic even if you start to feel better. Lifestyle  Limit alcohol intake to no more than 1 drink a day for nonpregnant women and 2 drinks a day for men. One drink equals 12 oz of beer, 5 oz of wine, or 1 oz of hard liquor.  Do not use any tobacco products, such as cigarettes, chewing tobacco, and e-cigarettes. If you need help quitting, ask your health care provider. Activity  Return to your normal activities as told by your health care provider. Ask your health care provider what activities are safe for you.  Do not lift anything that is heavier than 10 lb (4.5 kg) until your health care approves. Eating and drinking  Follow instructions from your health care provider about eating or drinking restrictions.  Drink enough fluid to keep your urine clear or  pale yellow. General instructions  Do not take baths, swim, or use a hot tub until your health care provider approves.  Wear compression stockings as told by your health care provider. These stockings help to prevent blood clots and reduce swelling in your legs.  If a tissue sample was removed for testing (biopsy) during your procedure, it is your responsibility to get your test results. Ask your health care provider or the department performing the test when your results will be ready.  Keep all follow-up visits as told by your health care provider. This is important. Contact a health care provider if:  You have blood clots in your urine.  You have pain that gets worse or does not get better with medicine, especially pain when you urinate.  You have difficulty urinating.  You feel nauseous or you vomit for more than 2 days after the procedure.  You have a fever. Get help right away if:  You have severe pain in your abdomen.  You cannot urinate. This information is not intended to replace advice given to you by your health care provider. Make sure you discuss any questions you have with your health care provider. Document Released: 07/16/2015 Document Revised: 01/10/2016 Document Reviewed: 05/16/2015  2017 Elsevier

## 2016-07-23 NOTE — Transfer of Care (Signed)
Immediate Anesthesia Transfer of Care Note  Patient: [name]  Procedure(s) Performed: Procedure(s): CYSTOSCOPY WITH RETROGRADE PYELOGRAM (Bilateral) BIOPSY AND FULGURATION OF BLADDER (N/A)  Patient Location: PACU  Anesthesia Type:General  Level of Consciousness: awake, oriented and patient cooperative  Airway & Oxygen Therapy: Patient Spontanous Breathing and Patient connected to face mask oxygen  Post-op Assessment: Report given to RN and Post -op Vital signs reviewed and stable  Post vital signs: Reviewed and stable  Last Vitals:  Vitals:   07/23/16 1310 07/23/16 1320  BP:    Pulse:    Resp: (!) 24 17  Temp:      Last Pain:  Vitals:   07/23/16 1240  TempSrc: Oral      Patients Stated Pain Goal: 9 ([id number])  Complications: No apparent anesthesia complications

## 2016-07-23 NOTE — Anesthesia Postprocedure Evaluation (Signed)
Anesthesia Post Note Late entry for 1450 Patient: [name]  Procedure(s) Performed: Procedure(s) (LRB): CYSTOSCOPY, BIOPSY AND FULGURATION OF BLADDER (N/A)  Patient location during evaluation: PACU Anesthesia Type: General Level of consciousness: awake and alert, oriented and patient cooperative Pain management: pain level controlled Vital Signs Assessment: post-procedure vital signs reviewed and stable Respiratory status: spontaneous breathing, nonlabored ventilation and respiratory function stable Cardiovascular status: stable and blood pressure returned to baseline Postop Assessment: no signs of nausea or vomiting Anesthetic complications: no    Last Vitals:  Vitals:   07/23/16 1500 07/23/16 1515  BP: 118/81 119/80  Pulse: 100 98  Resp: (!) 25 20  Temp:      Last Pain:  Vitals:   07/23/16 1240  TempSrc: Oral                 ADAMS, AMY A

## 2016-07-23 NOTE — Anesthesia Preprocedure Evaluation (Signed)
Anesthesia Evaluation  Patient identified by MRN, date of birth, ID band Patient awake    Reviewed: Allergy & Precautions, NPO status , Patient's Chart, lab work & pertinent test results  Airway Mallampati: III  TM Distance: >3 FB Neck ROM: Full    Dental no notable dental hx. (+) Teeth Intact   Pulmonary asthma , sleep apnea ,  Patient snores, had sleep study but has yet to have CPAP titrated   Pulmonary exam normal breath sounds clear to auscultation       Cardiovascular hypertension, Normal cardiovascular exam Rhythm:Regular Rate:Normal  PIH   Neuro/Psych  Headaches, Anxiety negative psych ROS   GI/Hepatic negative GI ROS, Neg liver ROS,   Endo/Other  Morbid obesity  Renal/GU negative Renal ROS  negative genitourinary   Musculoskeletal Soft tissue mass right thigh   Abdominal (+) + obese,   Peds  Hematology negative hematology ROS (+)  Lymphoma, Hodgkin's  Stage IV    Anesthesia Other Findings   Reproductive/Obstetrics negative OB ROS                             Anesthesia Physical Anesthesia Plan  ASA: III  Anesthesia Plan: General   Post-op Pain Management:    Induction: Intravenous  Airway Management Planned: LMA  Additional Equipment:   Intra-op Plan:   Post-operative Plan: Extubation in OR  Informed Consent: I have reviewed the patients History and Physical, chart, labs and discussed the procedure including the risks, benefits and alternatives for the proposed anesthesia with the patient or authorized representative who has indicated his/her understanding and acceptance.   Dental advisory given  Plan Discussed with: Anesthesiologist, CRNA and Surgeon  Anesthesia Plan Comments:         Anesthesia Quick Evaluation

## 2016-07-23 NOTE — Brief Op Note (Signed)
07/23/2016  2:23 PM  PATIENT:  Raford Pitcher  30 y.o. female  PRE-OPERATIVE DIAGNOSIS:  bladder lesion  POST-OPERATIVE DIAGNOSIS:  * No post-op diagnosis entered *  PROCEDURE:  Procedure(s): CYSTOSCOPY WITH RETROGRADE PYELOGRAM (Bilateral) BIOPSY AND FULGURATION OF BLADDER (N/A)  SURGEON:  Surgeon(s) and Role:    * Cleon Gustin, MD - Primary  PHYSICIAN ASSISTANT:   ASSISTANTS: none   ANESTHESIA:   general  EBL:  Total I/O In: 500 [I.V.:500] Out: -   BLOOD ADMINISTERED:none  DRAINS: none   LOCAL MEDICATIONS USED:  NONE  SPECIMEN:  Source of Specimen:  trigone lesion  DISPOSITION OF SPECIMEN:  PATHOLOGY  COUNTS:  YES  TOURNIQUET:  * No tourniquets in log *  DICTATION: .Note written in EPIC  PLAN OF CARE: Discharge to home after PACU  PATIENT DISPOSITION:  PACU - hemodynamically stable.   Delay start of Pharmacological VTE agent (>24hrs) due to surgical blood loss or risk of bleeding: not applicable

## 2016-07-25 ENCOUNTER — Encounter (HOSPITAL_COMMUNITY): Payer: Self-pay | Admitting: Urology

## 2016-07-28 ENCOUNTER — Ambulatory Visit (HOSPITAL_COMMUNITY): Payer: 59 | Admitting: Hematology & Oncology

## 2016-07-28 ENCOUNTER — Encounter (HOSPITAL_COMMUNITY): Payer: 59 | Attending: Hematology & Oncology

## 2016-07-28 VITALS — BP 138/70 | HR 103 | Temp 98.2°F | Resp 18 | Wt 276.4 lb

## 2016-07-28 DIAGNOSIS — C8105 Nodular lymphocyte predominant Hodgkin lymphoma, lymph nodes of inguinal region and lower limb: Secondary | ICD-10-CM | POA: Diagnosis not present

## 2016-07-28 DIAGNOSIS — Z5111 Encounter for antineoplastic chemotherapy: Secondary | ICD-10-CM | POA: Diagnosis not present

## 2016-07-28 DIAGNOSIS — R309 Painful micturition, unspecified: Secondary | ICD-10-CM | POA: Insufficient documentation

## 2016-07-28 DIAGNOSIS — Z79899 Other long term (current) drug therapy: Secondary | ICD-10-CM | POA: Diagnosis not present

## 2016-07-28 DIAGNOSIS — Z5112 Encounter for antineoplastic immunotherapy: Secondary | ICD-10-CM

## 2016-07-28 DIAGNOSIS — E876 Hypokalemia: Secondary | ICD-10-CM

## 2016-07-28 LAB — CBC WITH DIFFERENTIAL/PLATELET
Basophils Absolute: 0 10*3/uL (ref 0.0–0.1)
Basophils Relative: 1 %
EOS ABS: 0.2 10*3/uL (ref 0.0–0.7)
Eosinophils Relative: 2 %
HEMATOCRIT: 35.6 % — AB (ref 36.0–46.0)
HEMOGLOBIN: 11.5 g/dL — AB (ref 12.0–15.0)
LYMPHS ABS: 0.9 10*3/uL (ref 0.7–4.0)
LYMPHS PCT: 12 %
MCH: 27.4 pg (ref 26.0–34.0)
MCHC: 32.3 g/dL (ref 30.0–36.0)
MCV: 84.8 fL (ref 78.0–100.0)
MONOS PCT: 16 %
Monocytes Absolute: 1.2 10*3/uL — ABNORMAL HIGH (ref 0.1–1.0)
NEUTROS ABS: 5.2 10*3/uL (ref 1.7–7.7)
NEUTROS PCT: 69 %
Platelets: 454 10*3/uL — ABNORMAL HIGH (ref 150–400)
RBC: 4.2 MIL/uL (ref 3.87–5.11)
RDW: 15.4 % (ref 11.5–15.5)
WBC: 7.5 10*3/uL (ref 4.0–10.5)

## 2016-07-28 LAB — COMPREHENSIVE METABOLIC PANEL
ALK PHOS: 38 U/L (ref 38–126)
ALT: 24 U/L (ref 14–54)
AST: 20 U/L (ref 15–41)
Albumin: 3.5 g/dL (ref 3.5–5.0)
Anion gap: 5 (ref 5–15)
BILIRUBIN TOTAL: 0.4 mg/dL (ref 0.3–1.2)
BUN: 9 mg/dL (ref 6–20)
CALCIUM: 9.1 mg/dL (ref 8.9–10.3)
CO2: 26 mmol/L (ref 22–32)
CREATININE: 0.67 mg/dL (ref 0.44–1.00)
Chloride: 103 mmol/L (ref 101–111)
GFR calc non Af Amer: >60 mL/min (ref >60)
GLUCOSE: 105 mg/dL — AB (ref 65–99)
Potassium: 3.4 mmol/L — ABNORMAL LOW (ref 3.5–5.1)
SODIUM: 134 mmol/L — AB (ref 135–145)
TOTAL PROTEIN: 6.8 g/dL (ref 6.5–8.1)

## 2016-07-28 MED ORDER — PEGFILGRASTIM 6 MG/0.6ML ~~LOC~~ PSKT
6.0000 mg | PREFILLED_SYRINGE | Freq: Once | SUBCUTANEOUS | Status: AC
  Administered 2016-07-28: 6 mg via SUBCUTANEOUS
  Filled 2016-07-28: qty 0.6

## 2016-07-28 MED ORDER — SODIUM CHLORIDE 0.9% FLUSH
10.0000 mL | INTRAVENOUS | Status: DC | PRN
  Administered 2016-07-28: 10 mL
  Filled 2016-07-28: qty 10

## 2016-07-28 MED ORDER — DOXORUBICIN HCL CHEMO IV INJECTION 2 MG/ML
50.0000 mg/m2 | Freq: Once | INTRAVENOUS | Status: AC
  Administered 2016-07-28: 116 mg via INTRAVENOUS
  Filled 2016-07-28: qty 58

## 2016-07-28 MED ORDER — DIPHENHYDRAMINE HCL 25 MG PO CAPS
50.0000 mg | ORAL_CAPSULE | Freq: Once | ORAL | Status: AC
  Administered 2016-07-28: 50 mg via ORAL
  Filled 2016-07-28: qty 2

## 2016-07-28 MED ORDER — DEXAMETHASONE SODIUM PHOSPHATE 10 MG/ML IJ SOLN
10.0000 mg | Freq: Once | INTRAMUSCULAR | Status: AC
  Administered 2016-07-28: 10 mg via INTRAVENOUS
  Filled 2016-07-28: qty 1

## 2016-07-28 MED ORDER — ACETAMINOPHEN 325 MG PO TABS
650.0000 mg | ORAL_TABLET | Freq: Once | ORAL | Status: AC
  Administered 2016-07-28: 650 mg via ORAL
  Filled 2016-07-28: qty 2

## 2016-07-28 MED ORDER — SODIUM CHLORIDE 0.9 % IV SOLN
750.0000 mg/m2 | Freq: Once | INTRAVENOUS | Status: AC
  Administered 2016-07-28: 1740 mg via INTRAVENOUS
  Filled 2016-07-28: qty 87

## 2016-07-28 MED ORDER — HEPARIN SOD (PORK) LOCK FLUSH 100 UNIT/ML IV SOLN
INTRAVENOUS | Status: AC
  Filled 2016-07-28: qty 5

## 2016-07-28 MED ORDER — SODIUM CHLORIDE 0.9 % IV SOLN: 10.0000 mg | Freq: Once | INTRAVENOUS | Status: DC

## 2016-07-28 MED ORDER — HEPARIN SOD (PORK) LOCK FLUSH 100 UNIT/ML IV SOLN
500.0000 [IU] | Freq: Once | INTRAVENOUS | Status: AC | PRN
  Administered 2016-07-28: 500 [IU]
  Filled 2016-07-28: qty 5

## 2016-07-28 MED ORDER — SODIUM CHLORIDE 0.9 % IV SOLN
Freq: Once | INTRAVENOUS | Status: AC
  Administered 2016-07-28: 10:00:00 via INTRAVENOUS

## 2016-07-28 MED ORDER — MISC. DEVICES MISC: 0 refills | Status: DC

## 2016-07-28 MED ORDER — VINCRISTINE SULFATE CHEMO INJECTION 1 MG/ML
1.0000 mg | Freq: Once | INTRAVENOUS | Status: AC
  Administered 2016-07-28: 1 mg via INTRAVENOUS
  Filled 2016-07-28: qty 1

## 2016-07-28 MED ORDER — POTASSIUM CHLORIDE CRYS ER 20 MEQ PO TBCR
40.0000 meq | EXTENDED_RELEASE_TABLET | Freq: Once | ORAL | Status: AC
  Administered 2016-07-28: 40 meq via ORAL
  Filled 2016-07-28: qty 2

## 2016-07-28 MED ORDER — PALONOSETRON HCL INJECTION 0.25 MG/5ML
0.2500 mg | Freq: Once | INTRAVENOUS | Status: AC
  Administered 2016-07-28: 0.25 mg via INTRAVENOUS
  Filled 2016-07-28: qty 5

## 2016-07-28 MED ORDER — RITUXIMAB CHEMO INJECTION 500 MG/50ML
375.0000 mg/m2 | Freq: Once | INTRAVENOUS | Status: AC
  Administered 2016-07-28: 900 mg via INTRAVENOUS
  Filled 2016-07-28: qty 50

## 2016-07-28 NOTE — Progress Notes (Signed)
[name] tolerated chemo tx and Neulasta on-pro well without complaints or incident. Labs reviewed with [name] PA prior to administering the chemotherapy. K+ 3.4 so pt given K-dur 40 meq PO today per orders from PA. Neulasta on-pro applied to left arm with green indicator light flashing. VSS upon discharge. Pt discharged self ambulatory in satisfactory condition accompanied by her husband

## 2016-07-28 NOTE — Patient Instructions (Signed)
[facility] Discharge Instructions for Patients Receiving Chemotherapy   Beginning January 23rd 2017 lab work for the Cancer Center will be done in the  Main lab at [facility] on 1st floor. If you have a lab appointment with the Cancer Center please come in thru the  Main Entrance and check in at the main information desk   Today you received the following chemotherapy agents Rituxan,Adriamycin,Cytoxan and Vincristine as well as Neulasta on-pro. Follow-up as scheduled. Call clinic for any questions or concerns  To help prevent nausea and vomiting after your treatment, we encourage you to take your nausea medication   If you develop nausea and vomiting, or diarrhea that is not controlled by your medication, call the clinic.  The clinic phone number is [phone]. Office hours are Monday-Friday 8:30am-5:00pm.  BELOW ARE SYMPTOMS THAT SHOULD BE REPORTED IMMEDIATELY:  *FEVER GREATER THAN 101.0 F  *CHILLS WITH OR WITHOUT FEVER  NAUSEA AND VOMITING THAT IS NOT CONTROLLED WITH YOUR NAUSEA MEDICATION  *UNUSUAL SHORTNESS OF BREATH  *UNUSUAL BRUISING OR BLEEDING  TENDERNESS IN MOUTH AND THROAT WITH OR WITHOUT PRESENCE OF ULCERS  *URINARY PROBLEMS  *BOWEL PROBLEMS  UNUSUAL RASH Items with * indicate a potential emergency and should be followed up as soon as possible. If you have an emergency after office hours please contact your primary care physician or go to the nearest emergency department.  Please call the clinic during office hours if you have any questions or concerns.   You may also contact the Patient Navigator at [phone] should you have any questions or need assistance in obtaining follow up care.      Resources For Cancer Patients and their Caregivers ? American Cancer Society: Can assist with transportation, wigs, general needs, runs Look Good Feel Better.        1-888-227-6333 ? Cancer Care: Provides financial assistance, online support  groups, medication/co-pay assistance.  1-800-813-HOPE (4673) ? [facility] Assists [place] cancer patients and their families through emotional , educational and financial support.  336-427-4357 ? [place] DSS Where to apply for food stamps, Medicaid and utility assistance. 336-342-1394 ? RCATS: Transportation to medical appointments. 336-347-2287 ? Social Security Administration: May apply for disability if have a Stage IV cancer. 336-342-7796 1-800-772-1213 ? [place] Aging, Disability and Transit Services: Assists with nutrition, care and transit needs. 336-349-2343         

## 2016-07-30 ENCOUNTER — Other Ambulatory Visit (HOSPITAL_COMMUNITY): Payer: Self-pay | Admitting: Emergency Medicine

## 2016-07-30 ENCOUNTER — Ambulatory Visit (INDEPENDENT_AMBULATORY_CARE_PROVIDER_SITE_OTHER): Payer: Self-pay | Admitting: Urology

## 2016-07-30 DIAGNOSIS — R310 Gross hematuria: Secondary | ICD-10-CM

## 2016-07-30 MED ORDER — CYCLOBENZAPRINE HCL 10 MG PO TABS: 10.0000 mg | ORAL_TABLET | Freq: Three times a day (TID) | ORAL | Status: DC | PRN

## 2016-07-30 MED ORDER — CYCLOBENZAPRINE HCL 10 MG PO TABS: 10.0000 mg | ORAL_TABLET | Freq: Three times a day (TID) | ORAL | 1 refills | Status: DC | PRN

## 2016-07-30 NOTE — Op Note (Signed)
Preoperative diagnosis: bladder lesions  Postoperative diagnosis: Same  Procedure: 1 cystoscopy 2. Bladder biopsy with fulgeration  Attending: Nicolette Bang  Anesthesia: General  Estimated blood loss: Minimal  Drains: none  Specimens: Bladder biopsies x 1 of trigone  Antibiotics: ancef  Findings: 1cm erythematous lesion at the trigone Ureteral orifices in normal anatomic location.   Indications: Patient is a 30 year old female with a history of gross hematuria who was found to have a bladder lesion on office cystoscopy After discussing treatment options, they decided proceed with bladder biopsy.  Procedure her in detail: The patient was brought to the operating room and a brief timeout was done to ensure correct patient, correct procedure, correct site. General anesthesia was administered patient was placed in dorsal lithotomy position. Their genitalia was then prepped and draped in usual sterile fashion. A rigid 77 French cystoscope was passed in the urethra and the bladder. Bladder was inspected and we noted a 1cm lesion on the trigone. the ureteral orifices were in the normal orthotopic locations. We then removed the cystoscope and placed a resectoscope into the bladder. We proceeded to obtain a biopsyu of the trigone where there was an area of erythema. Hemostasis was then obtained with a bugbee. the bladder was then drained Complications: None  Condition: Stable, extubated, transferred to PACU  Plan: Patient will be discharged home and will followup in 1 week for a pathology discussion

## 2016-07-30 NOTE — Progress Notes (Signed)
  Pt called and had a back spasm that lasted for 20 minutes last night.  Pt wants to know if she can have muscle relaxer.  Spoke with Dr Whitney Muse.  Call flexeril into pharmacy.  If this does not help, let us know.  Verbalized understanding.

## 2016-08-15 ENCOUNTER — Other Ambulatory Visit (HOSPITAL_COMMUNITY): Payer: Self-pay | Admitting: Emergency Medicine

## 2016-08-19 ENCOUNTER — Encounter (HOSPITAL_COMMUNITY): Payer: 59 | Attending: Hematology & Oncology

## 2016-08-19 ENCOUNTER — Encounter (HOSPITAL_COMMUNITY): Payer: Self-pay | Admitting: Hematology & Oncology

## 2016-08-19 ENCOUNTER — Encounter (HOSPITAL_BASED_OUTPATIENT_CLINIC_OR_DEPARTMENT_OTHER): Payer: 59 | Admitting: Hematology & Oncology

## 2016-08-19 VITALS — BP 122/64 | HR 120 | Temp 97.9°F | Resp 18 | Wt 278.6 lb

## 2016-08-19 DIAGNOSIS — M25552 Pain in left hip: Secondary | ICD-10-CM

## 2016-08-19 DIAGNOSIS — Z79899 Other long term (current) drug therapy: Secondary | ICD-10-CM | POA: Insufficient documentation

## 2016-08-19 DIAGNOSIS — Z5112 Encounter for antineoplastic immunotherapy: Secondary | ICD-10-CM | POA: Diagnosis not present

## 2016-08-19 DIAGNOSIS — Z87448 Personal history of other diseases of urinary system: Secondary | ICD-10-CM

## 2016-08-19 DIAGNOSIS — L732 Hidradenitis suppurativa: Secondary | ICD-10-CM | POA: Diagnosis not present

## 2016-08-19 DIAGNOSIS — Z5111 Encounter for antineoplastic chemotherapy: Secondary | ICD-10-CM

## 2016-08-19 DIAGNOSIS — G620 Drug-induced polyneuropathy: Secondary | ICD-10-CM | POA: Diagnosis not present

## 2016-08-19 DIAGNOSIS — C8105 Nodular lymphocyte predominant Hodgkin lymphoma, lymph nodes of inguinal region and lower limb: Secondary | ICD-10-CM

## 2016-08-19 DIAGNOSIS — R530 Neoplastic (malignant) related fatigue: Secondary | ICD-10-CM

## 2016-08-19 DIAGNOSIS — I10 Essential (primary) hypertension: Secondary | ICD-10-CM

## 2016-08-19 DIAGNOSIS — T451X5A Adverse effect of antineoplastic and immunosuppressive drugs, initial encounter: Secondary | ICD-10-CM

## 2016-08-19 DIAGNOSIS — C8108 Nodular lymphocyte predominant Hodgkin lymphoma, lymph nodes of multiple sites: Secondary | ICD-10-CM

## 2016-08-19 DIAGNOSIS — R309 Painful micturition, unspecified: Secondary | ICD-10-CM | POA: Insufficient documentation

## 2016-08-19 LAB — COMPREHENSIVE METABOLIC PANEL
ALT: 24 U/L (ref 14–54)
ANION GAP: 6 (ref 5–15)
AST: 22 U/L (ref 15–41)
Albumin: 3.5 g/dL (ref 3.5–5.0)
Alkaline Phosphatase: 43 U/L (ref 38–126)
BILIRUBIN TOTAL: 0.3 mg/dL (ref 0.3–1.2)
BUN: 10 mg/dL (ref 6–20)
CO2: 25 mmol/L (ref 22–32)
Calcium: 9.1 mg/dL (ref 8.9–10.3)
Chloride: 105 mmol/L (ref 101–111)
Creatinine, Ser: 0.66 mg/dL (ref 0.44–1.00)
GFR calc Af Amer: >60 mL/min (ref >60)
Glucose, Bld: 140 mg/dL — ABNORMAL HIGH (ref 65–99)
POTASSIUM: 3.7 mmol/L (ref 3.5–5.1)
Sodium: 136 mmol/L (ref 135–145)
TOTAL PROTEIN: 6.8 g/dL (ref 6.5–8.1)

## 2016-08-19 LAB — CBC WITH DIFFERENTIAL/PLATELET
Basophils Absolute: 0 10*3/uL (ref 0.0–0.1)
Basophils Relative: 1 %
Eosinophils Absolute: 0.2 10*3/uL (ref 0.0–0.7)
Eosinophils Relative: 2 %
HEMATOCRIT: 35.4 % — AB (ref 36.0–46.0)
Hemoglobin: 11.5 g/dL — ABNORMAL LOW (ref 12.0–15.0)
LYMPHS PCT: 10 %
Lymphs Abs: 0.8 10*3/uL (ref 0.7–4.0)
MCH: 28 pg (ref 26.0–34.0)
MCHC: 32.5 g/dL (ref 30.0–36.0)
MCV: 86.3 fL (ref 78.0–100.0)
MONO ABS: 1.3 10*3/uL — AB (ref 0.1–1.0)
MONOS PCT: 16 %
NEUTROS ABS: 6.1 10*3/uL (ref 1.7–7.7)
Neutrophils Relative %: 71 %
Platelets: 484 10*3/uL — ABNORMAL HIGH (ref 150–400)
RBC: 4.1 MIL/uL (ref 3.87–5.11)
RDW: 15.4 % (ref 11.5–15.5)
WBC: 8.5 10*3/uL (ref 4.0–10.5)

## 2016-08-19 MED ORDER — SODIUM CHLORIDE 0.9 % IV SOLN
750.0000 mg/m2 | Freq: Once | INTRAVENOUS | Status: AC
  Administered 2016-08-19: 1740 mg via INTRAVENOUS
  Filled 2016-08-19: qty 87

## 2016-08-19 MED ORDER — HEPARIN SOD (PORK) LOCK FLUSH 100 UNIT/ML IV SOLN
500.0000 [IU] | Freq: Once | INTRAVENOUS | Status: AC | PRN
  Administered 2016-08-19: 500 [IU]
  Filled 2016-08-19 (×2): qty 5

## 2016-08-19 MED ORDER — SODIUM CHLORIDE 0.9 % IV SOLN
Freq: Once | INTRAVENOUS | Status: AC
Start: 2016-08-19 — End: 2016-08-19
  Administered 2016-08-19: 10:00:00 via INTRAVENOUS

## 2016-08-19 MED ORDER — DOXORUBICIN HCL CHEMO IV INJECTION 2 MG/ML
50.0000 mg/m2 | Freq: Once | INTRAVENOUS | Status: AC
  Administered 2016-08-19: 116 mg via INTRAVENOUS
  Filled 2016-08-19: qty 58

## 2016-08-19 MED ORDER — PEGFILGRASTIM 6 MG/0.6ML ~~LOC~~ PSKT
6.0000 mg | PREFILLED_SYRINGE | Freq: Once | SUBCUTANEOUS | Status: AC
  Administered 2016-08-19: 6 mg via SUBCUTANEOUS
  Filled 2016-08-19: qty 0.6

## 2016-08-19 MED ORDER — VINCRISTINE SULFATE CHEMO INJECTION 1 MG/ML
1.0000 mg | Freq: Once | INTRAVENOUS | Status: DC
Start: 2016-08-19 — End: 2016-08-19

## 2016-08-19 MED ORDER — ACETAMINOPHEN 325 MG PO TABS
650.0000 mg | ORAL_TABLET | Freq: Once | ORAL | Status: AC
  Administered 2016-08-19: 650 mg via ORAL
  Filled 2016-08-19: qty 2

## 2016-08-19 MED ORDER — SODIUM CHLORIDE 0.9 % IV SOLN: 10.0000 mg | Freq: Once | INTRAVENOUS | Status: DC

## 2016-08-19 MED ORDER — PALONOSETRON HCL INJECTION 0.25 MG/5ML
0.2500 mg | Freq: Once | INTRAVENOUS | Status: AC
  Administered 2016-08-19: 0.25 mg via INTRAVENOUS
  Filled 2016-08-19: qty 5

## 2016-08-19 MED ORDER — DIPHENHYDRAMINE HCL 25 MG PO CAPS
50.0000 mg | ORAL_CAPSULE | Freq: Once | ORAL | Status: AC
  Administered 2016-08-19: 50 mg via ORAL
  Filled 2016-08-19: qty 2

## 2016-08-19 MED ORDER — DEXAMETHASONE SODIUM PHOSPHATE 10 MG/ML IJ SOLN
10.0000 mg | Freq: Once | INTRAMUSCULAR | Status: AC
  Administered 2016-08-19: 10 mg via INTRAVENOUS
  Filled 2016-08-19: qty 1

## 2016-08-19 MED ORDER — SODIUM CHLORIDE 0.9 % IV SOLN
375.0000 mg/m2 | Freq: Once | INTRAVENOUS | Status: AC
  Administered 2016-08-19: 900 mg via INTRAVENOUS
  Filled 2016-08-19: qty 50

## 2016-08-19 MED ORDER — SODIUM CHLORIDE 0.9% FLUSH
10.0000 mL | INTRAVENOUS | Status: DC | PRN
  Administered 2016-08-19: 10 mL
  Filled 2016-08-19: qty 10

## 2016-08-19 NOTE — Progress Notes (Signed)
Chemotherapy given today per orders. Patient tolerated it well , no problems. Neulasta onpro instructions given. Vitals stable and discharged from clinic ambulatory with family. Follow up as scheduled

## 2016-08-19 NOTE — Patient Instructions (Signed)
[facility] Discharge Instructions for Patients Receiving Chemotherapy   Beginning January 23rd 2017 lab work for the [facility] will be done in the  Main lab at [facility] on 1st floor. If you have a lab appointment with the [facility] please come in thru the  Main Entrance and check in at the main information desk   Today you received the following chemotherapy agents Adriamycin, Rituxan, cyclophosphamide  To help prevent nausea and vomiting after your treatment, we encourage you to take your nausea medication      If you develop nausea and vomiting, or diarrhea that is not controlled by your medication, call the clinic.  The clinic phone number is [phone]. Office hours are Monday-Friday 8:30am-5:00pm.  BELOW ARE SYMPTOMS THAT SHOULD BE REPORTED IMMEDIATELY:  *FEVER GREATER THAN 101.0 F  *CHILLS WITH OR WITHOUT FEVER  NAUSEA AND VOMITING THAT IS NOT CONTROLLED WITH YOUR NAUSEA MEDICATION  *UNUSUAL SHORTNESS OF BREATH  *UNUSUAL BRUISING OR BLEEDING  TENDERNESS IN MOUTH AND THROAT WITH OR WITHOUT PRESENCE OF ULCERS  *URINARY PROBLEMS  *BOWEL PROBLEMS  UNUSUAL RASH Items with * indicate a potential emergency and should be followed up as soon as possible. If you have an emergency after office hours please contact your primary care physician or go to the nearest emergency department.  Please call the clinic during office hours if you have any questions or concerns.   You may also contact the Patient Navigator at [phone] should you have any questions or need assistance in obtaining follow up care.      Resources For Cancer Patients and their Caregivers ? American Cancer Society: Can assist with transportation, wigs, general needs, runs Look Good Feel Better.        928-417-3852 ? Cancer Care: Provides financial assistance, online support groups, medication/co-pay assistance.  1-800-813-HOPE 615-057-7528) ? [place] Assists [place] cancer patients and their families through emotional , educational and financial support.  204-854-3610 ? [place] DSS Where to apply for food stamps, Medicaid and utility assistance. 3642756132 ? [facility]ATS: Transportation to medical appointments. 4107244700 ? Social Security Administration: May apply for disability if have a Stage IV cancer. 5027737428 405-046-2862 ? [employer], Disability and Transit Services: Assists with nutrition, care and transit needs. (828)019-9901

## 2016-08-19 NOTE — Patient Instructions (Addendum)
[place] at [facility] Discharge Instructions  RECOMMENDATIONS MADE BY THE CONSULTANT AND ANY TEST RESULTS WILL BE SENT TO YOUR REFERRING PHYSICIAN.  You were seen today by Dr. Whitney Muse PET scan has been ordered Lab work and follow up after PET scan  Thank you for choosing [place] at [facility] to provide your oncology and hematology care.  To afford each patient quality time with our provider, please arrive at least 15 minutes before your scheduled appointment time.    If you have a lab appointment with the [facility] please come in thru the  Main Entrance and check in at the main information desk  You need to re-schedule your appointment should you arrive 10 or more minutes late.  We strive to give you quality time with our providers, and arriving late affects you and other patients whose appointments are after yours.  Also, if you no show three or more times for appointments you may be dismissed from the clinic at the providers discretion.     Again, thank you for choosing [facility].  Our hope is that these requests will decrease the amount of time that you wait before being seen by our physicians.       _____________________________________________________________  Should you have questions after your visit to [facility], please contact our office at [phone] between the hours of 8:30 a.m. and 4:30 p.m.  Voicemails left after 4:30 p.m. will not be returned until the following business day.  For prescription refill requests, have your pharmacy contact our office.       Resources For Cancer Patients and their Caregivers ? American Cancer Society: Can assist with transportation, wigs, general needs, runs Look Good Feel Better.        (928)037-0993 ? Cancer Care: Provides financial assistance, online support groups, medication/co-pay assistance.  1-800-813-HOPE 713-873-5964) ? [place] Assists [place] cancer patients and their families through emotional , educational and financial support.  616-766-5465 ? [place] DSS Where to apply for food stamps, Medicaid and utility assistance. 4794208387 ? [employer]ATS: Transportation to medical appointments. 705-886-7300 ? Social Security Administration: May apply for disability if have a Stage IV cancer. 740-864-6039 (938)134-6381 ? [employer], Disability and Transit Services: Assists with nutrition, care and transit needs. [place] Support Programs: @10RELATIVEDAYS @ > Cancer Support Group  2nd Tuesday of the month 1pm-2pm, Journey Room  > Creative Journey  3rd Tuesday of the month 1130am-1pm, Journey Room  > Look Good Feel Better  1st Wednesday of the month 10am-12 noon, Journey Room (Call [place] to register [phone])

## 2016-08-19 NOTE — Progress Notes (Signed)
[place]  PROGRESS NOTE  Patient Care Team: Pcp Not In System as PCP - General  CHIEF COMPLAINTS/PURPOSE OF CONSULTATION:     Hodgkin lymphoma, nodular lymphocyte predominance ([place])   04/07/2016 Procedure    Excisional lymph node biopsy of right thigh      04/11/2016 Pathology Results    Nodular lymphocyte predominant hodgkin Lymphoma      04/23/2016 Miscellaneous    Normal ESR, LDH      04/25/2016 PET scan    1. Hypermetabolic enlarged bilateral axillary, bilateral external iliac and bilateral inguinal lymph nodes consistent with lymphoma. 2. Hypermetabolic foci of skin thickening in the bilateral axilla and bilateral lower ventral chest wall, nonspecific, cannot exclude cutaneous lymphoma. 3. Hypermetabolism throughout Waldeyer's ring with associated mucosal thickening on the CT images, which could be inflammatory or due to lymphoma. 4. No splenic enlargement or hypermetabolism. 5. Diffuse hypermetabolism throughout the axial skeleton without discrete bone lesions on the CT images, worrisome for diffuse osseous involvement by lymphoma.      04/29/2016 Imaging    MUGA- Left ventricular ejection fraction equals 66 %.      05/05/2016 -  Chemotherapy    The patient had DOXOrubicin (ADRIAMYCIN) chemo injection 116 mg, 50 mg/m2 = 116 mg, Intravenous,  Once, 3 of 6 cycles  palonosetron (ALOXI) injection 0.25 mg, 0.25 mg, Intravenous,  Once, 3 of 6 cycles  pegfilgrastim (NEULASTA ONPRO KIT) injection 6 mg, 6 mg, Subcutaneous, Once, 3 of 6 cycles  vinCRIStine (ONCOVIN) 2 mg in sodium chloride 0.9 % 50 mL chemo infusion, 2 mg, Intravenous,  Once, 3 of 6 cycles Dose modification: 1 mg (original dose 2 mg, Cycle 3, Reason: Dose not tolerated, Comment: neuropathy)  riTUXimab (RITUXAN) 900 mg in sodium chloride 0.9 % 250 mL (2.6471 mg/mL) chemo infusion, 375 mg/m2 = 900 mg, Intravenous,  Once, 3 of 6 cycles  cyclophosphamide (CYTOXAN) 1,740 mg in sodium chloride  0.9 % 250 mL chemo infusion, 750 mg/m2 = 1,740 mg, Intravenous,  Once, 3 of 6 cycles  for chemotherapy treatment.        07/02/2016 Procedure    Flexible cystoscopy by Dr. Alyson Ingles (Urology) discovering a 1 cm lesion with clot attached on the posterior wall of bladder.      07/04/2016 PET scan    1. Interval improvement in hypermetabolic nodal activity within the axilla, pelvis and inguinal regions bilaterally consistent with positive response to therapy. Deauville stage III. 2. Interval improvement in the previous demonstrated multifocal subcutaneous activity. There is residual prominent activity in the anterior left perineal region which could be inflammatory.  There is no focal hypermetabolic activity to suggest osseous metastatic disease. Mildly prominent marrow activity is again noted throughout the axial and proximal appendicular skeleton.       HISTORY OF PRESENTING ILLNESS:  [name] 31 y.o. 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 I have discussed her case with [facility] and based upon guidelines, reading and [facility] consultation RCHOP was recommended.   [name] is accompanied by her husband and son. Patient is here for cycle #6 of RCHOP and presents in treatment bed.  She had a good holiday. She has been doing well. Her appetite is okay.  She reports back spasms continue. These happen intermittently. They begin in her low back and come up her spine. She believes it is a chemotherapy related symptom with Neulasta. These spasms occur more frequently after treatment and taper  off prior to her next cycle.   Her fingers and toes tingle constantly. Her toes are not as bad as her hands. In the mornings her fingers are numb and it makes it difficult to use her phone without working out her hands. If she stands more than 15 minutes, her legs feel numb and she has a burning sensation in her hips. She has a "fish out of  water sensation" where she cannot feel her legs after standing for prolonged periods. She is able to cook and clean but has to sit after standing for 15 minutes. She is able to feel the gas pedal to drive.   She reports the pain in her legs is a 5 out 10 on pain scale.  She had a panic attack while thinking about getting in the machine for repeat PET scans.  She had a cystoscopy and was told her bladder looked fine. She denies anymore bleeding. She continues to experience burning when her bladder is full. No fever or chills. No nausea or vomiting.  No night sweats.   MEDICAL HISTORY:  Past Medical History:  Diagnosis Date  . Anxiety   . Asthma    had since childhood; has not had to use inhaler or nebulizer for 8-9 years. Been doing well.  Marland Kitchen Dyspnea   . Headache(784.0)    usually controlled c Tylenol when not pregnant, but more painful headaches  during preg. due to hypertension.  . Hidradenitis suppurativa   . Hodgkin lymphoma, nodular lymphocyte predominance ([place]) 04/15/2016  . Hypertension December 2011   Been on [place] since Dec. 2011  . Lymphoma, Hodgkin's ([place])    Stage IV  . Sleep apnea     SURGICAL HISTORY: Past Surgical History:  Procedure Laterality Date  . ADENOIDECTOMY     age 37  . BREAST RECONSTRUCTION  December 2001   breast reduction at age 81  . FULGURATION OF BLADDER TUMOR N/A 07/23/2016   Procedure: CYSTOSCOPY, BIOPSY AND FULGURATION OF BLADDER;  Surgeon: Cleon Gustin, MD;  Location: [facility];  Service: Urology;  Laterality: N/A;  . MASS EXCISION Right 04/07/2016   Procedure: EXCISION/BIOPSY SOFT TISSUE MASS RIGHT THIGH;  Surgeon: Aviva Signs, MD;  Location: [facility];  Service: General;  Laterality: Right;  . port a cath placed    . PORTACATH PLACEMENT Left 04/28/2016   Procedure: INSERTION PORT-A-CATH;  Surgeon: Aviva Signs, MD;  Location: [facility];  Service: General;  Laterality: Left;    SOCIAL HISTORY: Social History   Social History  . Marital  status: Married    Spouse name: N/A  . Number of children: N/A  . Years of education: N/A   Occupational History  . Not on file.   Social History Main Topics  . Smoking status: Never Smoker  . Smokeless tobacco: Never Used  . Alcohol use 0.6 oz/week    1 Glasses of wine per week  . Drug use: No  . Sexual activity: Yes    Birth control/ protection: IUD   Other Topics Concern  . Not on file   Social History Narrative  . No narrative on file  She has two children. One is 4 and one is 13. She also has a dog. She like to dance.  She has been married 8 years, but has been with her husband a total of 18 years.  Patient works from home for [employer].  FAMILY HISTORY: Family History  Problem Relation Age of Onset  . Hypertension Mother   .  Cancer Mother   . Hypertension Father   . Heart disease Maternal Grandmother     ALLERGIES:  has No Known Allergies.  MEDICATIONS:  Current Outpatient Prescriptions  Medication Sig Dispense Refill  . allopurinol (ZYLOPRIM) 300 MG tablet Take 1 tablet (300 mg total) by mouth daily. 30 tablet 3  . ALPRAZolam (XANAX) 0.5 MG tablet Take 1 tablet (0.5 mg total) by mouth 3 (three) times daily as needed for anxiety. 30 tablet 1  . amLODipine (NORVASC) 5 MG tablet Take 1 tablet (5 mg total) by mouth daily. 30 tablet 1  . cyclobenzaprine (FLEXERIL) 10 MG tablet Take 1 tablet (10 mg total) by mouth 3 (three) times daily as needed for muscle spasms. 30 tablet 1  . CYCLOPHOSPHAMIDE IV Inject 1,740 mg into the vein every 21 ( twenty-one) days. Every 21 days     . DOXORUBICIN HCL IV Inject 116 mg into the vein every 21 ( twenty-one) days. Every 21 days     . HYDROcodone-acetaminophen (NORCO) 5-325 MG tablet Take 1-2 tablets by mouth every 4 (four) hours as needed for moderate pain. 45 tablet 0  . ibuprofen (ADVIL,MOTRIN) 200 MG tablet Take 400-600 mg by mouth every 6 (six) hours as needed for moderate pain.    Marland Kitchen levonorgestrel (MIRENA) 20  MCG/24HR IUD 1 each by Intrauterine route once.    . lidocaine-prilocaine (EMLA) cream Apply to affected area once (Patient taking differently: Apply 1 application topically daily as needed (prior to port access). Apply to affected area once) 30 g 3  . Misc. 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 1 each 0  . ondansetron (ZOFRAN) 8 MG tablet TAKE 1 TABLET BY MOUTH 2 TIMES DAILY AS NEEDED FOR REFRACTORY NAUSEA/VOMITING. START ON DAY 3 AFTER CYCLOPHOSAMIDE. 30 tablet 0  . oxyCODONE-acetaminophen (ROXICET) 5-325 MG tablet Take 1 tablet by mouth every 4 (four) hours as needed for severe pain. 30 tablet 0  . Pegfilgrastim (NEULASTA ONPRO [place]) Inject 6 mg into the skin every 21 ( twenty-one) days. Every 21 days     . potassium chloride SA (K-DUR,KLOR-CON) 20 MEQ tablet Take 1 tablet (20 mEq total) by mouth daily. 30 tablet 0  . predniSONE (DELTASONE) 20 MG tablet Take 4.5 tablets (90 mg total) by mouth daily. Take on days 1-5 of chemotherapy. 45 tablet 6  . prochlorperazine (COMPAZINE) 10 MG tablet Take 1 tablet (10 mg total) by mouth every 6 (six) hours as needed (Nausea or vomiting). 30 tablet 6  . RiTUXimab (RITUXAN IV) Inject 900 mg into the vein every 21 ( twenty-one) days. Every 21 days     . temazepam (RESTORIL) 30 MG capsule Take 1 capsule (30 mg total) by mouth at bedtime. 30 capsule 1  . VINCRISTINE SULFATE IV Inject 1 mg into the vein every 21 ( twenty-one) days. Every 21 days     . Vitamin D, Ergocalciferol, (DRISDOL) 50000 units CAPS capsule Take 50,000 Units by mouth every Monday. Takes on Mondays.     No current facility-administered medications for this visit.     Review of Systems  Constitutional: Negative for fever.       Poor appetite  HENT: Negative.   Eyes: Negative.   Respiratory: Negative.   Cardiovascular: Negative.        Left ankle swelling  Gastrointestinal: Negative for vomiting.       Bowels okay with daily  Miralax  Genitourinary: Negative.  Negative for hematuria.  Burning sensation with full bladder  Musculoskeletal: Positive for joint pain.       Back spasms   Skin: Negative.   Neurological: Positive for tingling.       Tingling in finger nail beds and toes. Neuropathy worse in the mornings.  Endo/Heme/Allergies: Negative.   Psychiatric/Behavioral: Negative.   All other systems reviewed and are negative. 14 point ROS was done and is otherwise as detailed above or in HPI   PHYSICAL EXAMINATION: ECOG PERFORMANCE STATUS: 1 - Symptomatic but completely ambulatory Vitals with BMI 08/19/2016  Height   Weight 278 lbs 10 oz  BMI   Systolic 086  Diastolic 81  Pulse 578  Respirations 18   Physical Exam  Constitutional: She is oriented to person, place, and time and well-developed, well-nourished, and in no distress.  Obese. In treatment bed.  HENT:  Head: Normocephalic and atraumatic.  Nose: Nose normal.  Mouth/Throat: Oropharynx is clear and moist. No oropharyngeal exudate.  Eyes: Conjunctivae and EOM are normal. Pupils are equal, round, and reactive to light. Right eye exhibits no discharge. Left eye exhibits no discharge. No scleral icterus.  Neck: Normal range of motion. Neck supple. No tracheal deviation present. No thyromegaly present.  Cardiovascular: Normal rate, regular rhythm and normal heart sounds.  Exam reveals no gallop and no friction rub.   No murmur heard. Pulmonary/Chest: Effort normal and breath sounds normal. She has no wheezes. She has no rales.  Abdominal: Soft. Bowel sounds are normal. She exhibits no distension and no mass. There is no tenderness. There is no rebound and no guarding.  Musculoskeletal: Normal range of motion. She exhibits no edema.  Lymphadenopathy:    She has no cervical adenopathy.  Neurological: She is alert and oriented to person, place, and time. She has normal reflexes. No cranial nerve deficit. Gait normal. Coordination normal.  Skin:  Skin is warm and dry. No rash noted.  Superficial comedones noted on the upper right arm and back of neck Right forearm circular area of darkened skin  Psychiatric: Mood, memory, affect and judgment normal.  Nursing note and vitals reviewed.   LABORATORY DATA:  I have reviewed the data as listed Lab Results  Component Value Date   WBC 7.5 07/28/2016   HGB 11.5 (L) 07/28/2016   HCT 35.6 (L) 07/28/2016   MCV 84.8 07/28/2016   PLT 454 (H) 07/28/2016   CMP     Component Value Date/Time   NA 134 (L) 07/28/2016 0920   K 3.4 (L) 07/28/2016 0920   CL 103 07/28/2016 0920   CO2 26 07/28/2016 0920   GLUCOSE 105 (H) 07/28/2016 0920   BUN 9 07/28/2016 0920   CREATININE 0.67 07/28/2016 0920   CREATININE 0.78 02/16/2013 1721   CALCIUM 9.1 07/28/2016 0920   PROT 6.8 07/28/2016 0920   ALBUMIN 3.5 07/28/2016 0920   AST 20 07/28/2016 0920   ALT 24 07/28/2016 0920   ALKPHOS 38 07/28/2016 0920   BILITOT 0.4 07/28/2016 0920   GFRNONAA >60 07/28/2016 0920   GFRNONAA >89 02/16/2013 1721   GFRAA >60 07/28/2016 0920   GFRAA >89 02/16/2013 1721    PATHOLOGY:     RADIOGRAPHIC STUDIES: I have personally reviewed the radiological images as listed and agreed with the findings in the report. Study Result   CLINICAL DATA:  Subsequent treatment strategy for Hodgkin's lymphoma. Chemotherapy ongoing.  EXAM: NUCLEAR MEDICINE PET SKULL BASE TO THIGH  TECHNIQUE: 13.7 mCi F-18 FDG was injected intravenously. Full-ring PET imaging was performed from  the skull base to thigh after the radiotracer. CT data was obtained and used for attenuation correction and anatomic localization.  FASTING BLOOD GLUCOSE:  Value: 86 mg/dl  COMPARISON:  Abdominal pelvic CT 05/11/2016.  PET-CT 04/25/2016.  FINDINGS: Interpretation of this examination was delayed as it was not made available for dictation in PACS until 07/07/2016.  NECK  No hypermetabolic cervical lymph nodes are identified.Mild  residual activity within Waldeyer's ring and muscles of phonation, within physiologic limits. No focal lesions of the pharyngeal mucosal space identified.  CHEST  Interval improvement in previously demonstrated hypermetabolic axillary adenopathy bilaterally. The largest remaining right axillary node measures 13 mm on image 56 (SUV max 2.3, previously 6.7). The largest remaining left axillary node measures 12 mm on image 66 (SUV max 3.3, previously 7.2). There are no hypermetabolic mediastinal or axillary lymph nodes. There is no suspicious pulmonary activity. The lungs are clear. Left subclavian Port-A-Cath extends to the mid SVC.  ABDOMEN/PELVIS  There is no hypermetabolic activity within the liver, adrenal glands, spleen or pancreas. There are no hypermetabolic abdominal lymph nodes. The previously demonstrated hypermetabolic pelvic and inguinal lymph nodes have improved. 1.3 cm right external iliac node on image 178 has an SUV max of 4.0 (previously 2.3 cm, SUV max 13.9). 10 mm left external iliac node on image 172 has an SUV max of 3.5 (previously 14 mm, SUV max 6.3). The inguinal nodes have also improved with the remaining greatest hypermetabolic activity on the right (SUV max 4.5). Fluid collection in the right groin again noted, measuring 4.3 x 3.5 cm. There is some hypermetabolic activity within the subcutaneous tissues of the low anterior left perineal region (SUV max 16.4), similar to previous study. Hepatic steatosis and IUD noted.  SKELETON  There is no focal hypermetabolic activity to suggest osseous metastatic disease. Mildly prominent marrow activity is again noted throughout the axial and proximal appendicular skeleton. The previously demonstrated dermal lesions in the chest appear improved. Residual right axillary subcutaneous activity has an SUV max of 4.1 (previously 9.1). No new or worsening subcutaneous lesions identified.  IMPRESSION: 1.  Interval improvement in hypermetabolic nodal activity within the axilla, pelvis and inguinal regions bilaterally consistent with positive response to therapy. Deauville stage III. 2. Interval improvement in the previous demonstrated multifocal subcutaneous activity. There is residual prominent activity in the anterior left perineal region which could be inflammatory. 3. No suspicious residual metabolic activity in Waldeyer's ring.   Electronically Signed   By: Richardean Sale M.D.   On: 07/07/2016 11:04    ASSESSMENT & PLAN:  Hodgkin Lymphoma Nodular Lymphocyte Predominant Disease, Stage IV No B symptoms Cancer related Fatigue Normal LDH Normal ESR Chemotherapy induced neuropathy HTN Hematuria, gross Bladder biopsy 12/15 with chronic inflammation L Hip pain Hidradenitis   Patient is here for cycle #6 of RCHOP. I have already decreased her Vincristine due to chemotherapy related neuropathy. I am going to d/c her vincristine today. 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 The patient reports constant neuropathy in her fingers and toes. She describes leg pain as 5 out of 10 on pain scale.   Hidradenitis is stable.   I have ordered repeat PET for 2 to 3 weeks after completion of therapy. We will follow-up post to review.   She is to continue to follow with urology as recommended for history of hematuria. Dr. Noland Fordyce notes are reviewed and appreciated. She underwent biopsy on 12/15 that showed chronic inflammation.  She will return for follow up post-PET to discuss these results.  Orders Placed This Encounter  Procedures  . NM PET Image Restag (PS) Skull Base To Thigh    Standing Status:   Future    Standing Expiration Date:   08/19/2017    Order Specific Question:   Reason for Exam (SYMPTOM  OR DIAGNOSIS REQUIRED)    Answer:   restaging HL    Order Specific Question:   Is the patient pregnant?    Answer:   No    Order  Specific Question:   Preferred imaging location?    Answer:   [facility]    Order Specific Question:   If indicated for the ordered procedure, I authorize the administration of a radiopharmaceutical per Radiology protocol    Answer:   Yes    FROM UP TO DATE: Combination chemotherapy is the main treatment for patients with stage III/IV NLPHL. When treated with alkylator-based regimens, adults with NLPHL respond to treatment in a similar fashion to patients with cHL and identical clinical stage. There are limited data regarding the best chemotherapy regimen for this population, and clinical practice varies. For most patients with advanced disease, we suggest the use of R-CHOP (rituximab, cyclophosphamide, doxorubicin, vincristine, and prednisone) (table 3) rather than ABVD (Grade 2C). This preference is primarily based upon our clinical experience, the desire to use rituximab in a CD20 positive tumor, and limited data that suggest that ABVD (doxorubicin, bleomycin, vinblastine, and dacarbazine) is not as effective as other alkylator-based regimens. (See 'Stage III/IV disease' above.) ?Approximately 15 to 30 percent of patients with NLPHL relapse. Relapsed disease tends to be responsive to further chemotherapy and/or involved-node or involved-site RT. Patients with relapsed disease should be restaged and treated accordingly.   How I treat nodular lymphocyte predominant Hodgkin lymphoma Ranjana H. Advani and Richard T. [place]  Blood 2013 [id number]; doi: [email or web address].nl   At [facility], patients with advanced disease are treated with curative intent. Given the universal expression of CD20, our usual first-line approach incorporates rituximab with regimens used for cHL. For patients who present with B symptoms or abdominal involvement, scenarios in which there may be occult transformation, we favor using RCHOP for 6 cycles NLPHL is rare, with few prospective trials  to guide therapy. RT continues to play an integral role in stage I to II disease, as does chemotherapy in advanced stages. The optimal chemotherapy is debatable, and some data suggest that including an alkylating agent may improve efficacy. Although the results with single-agent rituximab in the front-line setting are inferior to conventional therapy, rituximab is a reasonable choice for patients with relapsed disease. The inherent risk of developing an aggressive B-cell lymphoma underscores the importance of long-term follow-up, as well as rebiopsy at relapse. Randomized trials are likely not feasible because of the rarity of NLPHL; however, strategies to consider testing include "watch and wait" for selected patients, such as young children with limited asymptomatic disease or individuals with totally excised solitary nodal involvement, and combinations of targeted therapy, such as rituximab or other anti-CD20 monoclonal antibodies with conventional therapy. The ultimate goal must be maintenance of excellent PFS and minimization of risk for late effects   All questions were answered. The patient knows to call the clinic with any problems, questions or concerns.  This document serves as a record of services personally performed by Ancil Linsey, MD. It was created on her behalf by Arlyce Harman, a trained medical scribe. The creation of this record is based on  the scribe's personal observations and the provider's statements to them. This document has been checked and approved by the attending provider.  I have reviewed the above documentation for accuracy and completeness, and I agree with the above.  This note was electronically signed.    Molli Hazard, MD  08/19/2016 8:27 AM

## 2016-09-05 ENCOUNTER — Other Ambulatory Visit (HOSPITAL_COMMUNITY): Payer: Self-pay | Admitting: Emergency Medicine

## 2016-09-05 MED ORDER — CEPHALEXIN 500 MG PO CAPS: 500.0000 mg | ORAL_CAPSULE | Freq: Four times a day (QID) | ORAL | 0 refills | Status: DC

## 2016-09-05 NOTE — Progress Notes (Signed)
Pt called and stated that her arm pt is swollen and red and tender to touch, she thinks she has an infection in it.  Spoke with tom Kefalas and he ordered keflex QID for 7 days.

## 2016-09-08 ENCOUNTER — Encounter (HOSPITAL_COMMUNITY)
Admission: RE | Admit: 2016-09-08 | Discharge: 2016-09-08 | Disposition: A | Discharge status: Home / Self Care | Payer: 59 | Source: Ambulatory Visit | Attending: Hematology & Oncology | Admitting: Hematology & Oncology

## 2016-09-08 ENCOUNTER — Encounter (HOSPITAL_COMMUNITY): Payer: Self-pay | Admitting: Radiology

## 2016-09-08 DIAGNOSIS — C8108 Nodular lymphocyte predominant Hodgkin lymphoma, lymph nodes of multiple sites: Secondary | ICD-10-CM | POA: Diagnosis present

## 2016-09-08 LAB — GLUCOSE, CAPILLARY: GLUCOSE-CAPILLARY: 89 mg/dL (ref 65–99)

## 2016-09-08 MED ORDER — FLUDEOXYGLUCOSE F - 18 (FDG) INJECTION
14.0200 | Freq: Once | INTRAVENOUS | Status: AC | PRN
  Administered 2016-09-08: 14.02 via INTRAVENOUS

## 2016-09-09 ENCOUNTER — Encounter (HOSPITAL_COMMUNITY): Payer: Self-pay | Admitting: Hematology & Oncology

## 2016-09-09 ENCOUNTER — Encounter (HOSPITAL_BASED_OUTPATIENT_CLINIC_OR_DEPARTMENT_OTHER): Payer: 59 | Admitting: Hematology & Oncology

## 2016-09-09 VITALS — BP 140/95 | HR 110 | Temp 98.5°F | Resp 22 | Wt 279.4 lb

## 2016-09-09 DIAGNOSIS — M25552 Pain in left hip: Secondary | ICD-10-CM

## 2016-09-09 DIAGNOSIS — I10 Essential (primary) hypertension: Secondary | ICD-10-CM | POA: Diagnosis not present

## 2016-09-09 DIAGNOSIS — C8108 Nodular lymphocyte predominant Hodgkin lymphoma, lymph nodes of multiple sites: Secondary | ICD-10-CM

## 2016-09-09 DIAGNOSIS — R530 Neoplastic (malignant) related fatigue: Secondary | ICD-10-CM | POA: Diagnosis not present

## 2016-09-09 DIAGNOSIS — C8100 Nodular lymphocyte predominant Hodgkin lymphoma, unspecified site: Secondary | ICD-10-CM

## 2016-09-09 DIAGNOSIS — Z87448 Personal history of other diseases of urinary system: Secondary | ICD-10-CM

## 2016-09-09 DIAGNOSIS — L732 Hidradenitis suppurativa: Secondary | ICD-10-CM

## 2016-09-09 DIAGNOSIS — C8105 Nodular lymphocyte predominant Hodgkin lymphoma, lymph nodes of inguinal region and lower limb: Secondary | ICD-10-CM | POA: Diagnosis not present

## 2016-09-09 MED ORDER — CYCLOBENZAPRINE HCL 10 MG PO TABS
10.0000 mg | ORAL_TABLET | Freq: Three times a day (TID) | ORAL | 1 refills | Status: DC | PRN
Start: 2016-09-09 — End: 2018-01-24

## 2016-09-09 MED ORDER — HYDROCODONE-ACETAMINOPHEN 5-325 MG PO TABS: 1.0000 | ORAL_TABLET | ORAL | 0 refills | Status: DC | PRN

## 2016-09-09 NOTE — Patient Instructions (Signed)
[place] at [facility] Discharge Instructions  RECOMMENDATIONS MADE BY THE CONSULTANT AND ANY TEST RESULTS WILL BE SENT TO YOUR REFERRING PHYSICIAN.  You were seen today by Dr. Whitney Muse We refilled your flexeril and hydrocodone/acetaminophen We will refer you to dermatology and you will follow up after you see dermatology  Thank you for choosing [place] at [facility] to provide your oncology and hematology care.  To afford each patient quality time with our provider, please arrive at least 15 minutes before your scheduled appointment time.    If you have a lab appointment with the [facility] please come in thru the  Main Entrance and check in at the main information desk  You need to re-schedule your appointment should you arrive 10 or more minutes late.  We strive to give you quality time with our providers, and arriving late affects you and other patients whose appointments are after yours.  Also, if you no show three or more times for appointments you may be dismissed from the clinic at the providers discretion.     Again, thank you for choosing [facility].  Our hope is that these requests will decrease the amount of time that you wait before being seen by our physicians.       _____________________________________________________________  Should you have questions after your visit to [facility], please contact our office at [phone] between the hours of 8:30 a.m. and 4:30 p.m.  Voicemails left after 4:30 p.m. will not be returned until the following business day.  For prescription refill requests, have your pharmacy contact our office.       Resources For Cancer Patients and their Caregivers ? American Cancer Society: Can assist with transportation, wigs, general needs, runs Look Good Feel Better.        303 680 6693 ? Cancer Care: Provides financial assistance, online support groups,  medication/co-pay assistance.  1-800-813-HOPE (484)209-9418) ? [facility] Assists [place] cancer patients and their families through emotional , educational and financial support.  919 847 7767 ? [place] DSS Where to apply for food stamps, Medicaid and utility assistance. 604-501-6805 ? [facility]: Transportation to medical appointments. 4708759483 ? Social Security Administration: May apply for disability if have a Stage IV cancer. (404) 307-8151 6846579174 ? [employer], Disability and Transit Services: Assists with nutrition, care and transit needs. [place] Support Programs: @10RELATIVEDAYS @ > Cancer Support Group  2nd Tuesday of the month 1pm-2pm, Journey Room  > Creative Journey  3rd Tuesday of the month 1130am-1pm, Journey Room  > Look Good Feel Better  1st Wednesday of the month 10am-12 noon, Journey Room (Call [place] to register [phone])

## 2016-09-09 NOTE — Progress Notes (Signed)
[place]  PROGRESS NOTE  Patient Care Team: Pcp Not In System as PCP - General  CHIEF COMPLAINTS/PURPOSE OF CONSULTATION:     Hodgkin lymphoma, nodular lymphocyte predominance ([place])   04/07/2016 Procedure    Excisional lymph node biopsy of right thigh      04/11/2016 Pathology Results    Nodular lymphocyte predominant hodgkin Lymphoma      04/23/2016 Miscellaneous    Normal ESR, LDH      04/25/2016 PET scan    1. Hypermetabolic enlarged bilateral axillary, bilateral external iliac and bilateral inguinal lymph nodes consistent with lymphoma. 2. Hypermetabolic foci of skin thickening in the bilateral axilla and bilateral lower ventral chest wall, nonspecific, cannot exclude cutaneous lymphoma. 3. Hypermetabolism throughout Waldeyer's ring with associated mucosal thickening on the CT images, which could be inflammatory or due to lymphoma. 4. No splenic enlargement or hypermetabolism. 5. Diffuse hypermetabolism throughout the axial skeleton without discrete bone lesions on the CT images, worrisome for diffuse osseous involvement by lymphoma.      04/29/2016 Imaging    MUGA- Left ventricular ejection fraction equals 66 %.      05/05/2016 -  Chemotherapy    The patient had DOXOrubicin (ADRIAMYCIN) chemo injection 116 mg, 50 mg/m2 = 116 mg, Intravenous,  Once, 3 of 6 cycles  palonosetron (ALOXI) injection 0.25 mg, 0.25 mg, Intravenous,  Once, 3 of 6 cycles  pegfilgrastim (NEULASTA ONPRO [place]) injection 6 mg, 6 mg, Subcutaneous, Once, 3 of 6 cycles  vinCRIStine (ONCOVIN) 2 mg in sodium chloride 0.9 % 50 mL chemo infusion, 2 mg, Intravenous,  Once, 3 of 6 cycles Dose modification: 1 mg (original dose 2 mg, Cycle 3, Reason: Dose not tolerated, Comment: neuropathy)  riTUXimab (RITUXAN) 900 mg in sodium chloride 0.9 % 250 mL (2.6471 mg/mL) chemo infusion, 375 mg/m2 = 900 mg, Intravenous,  Once, 3 of 6 cycles  cyclophosphamide (CYTOXAN) 1,740 mg in sodium chloride  0.9 % 250 mL chemo infusion, 750 mg/m2 = 1,740 mg, Intravenous,  Once, 3 of 6 cycles  for chemotherapy treatment.        07/02/2016 Procedure    Flexible cystoscopy by Dr. Alyson Ingles (Urology) discovering a 1 cm lesion with clot attached on the posterior wall of bladder.      07/04/2016 PET scan    1. Interval improvement in hypermetabolic nodal activity within the axilla, pelvis and inguinal regions bilaterally consistent with positive response to therapy. Deauville stage III. 2. Interval improvement in the previous demonstrated multifocal subcutaneous activity. There is residual prominent activity in the anterior left perineal region which could be inflammatory.  There is no focal hypermetabolic activity to suggest osseous metastatic disease. Mildly prominent marrow activity is again noted throughout the axial and proximal appendicular skeleton.      09/08/2016 PET scan    No significant change in mild hypermetabolic lymphadenopathy in bilateral axillary, external iliac, and inguinal chains (Deauville score 3). No new or increased hypermetabolic lymphadenopathy identified .  Increased multifocal hypermetabolic subcutaneous foci within the chest, abdomen and pelvis. This is of uncertain etiology and clinical significance .       HISTORY OF PRESENTING ILLNESS:  [name] 31 y.o. 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 I have discussed her case with [facility] and based upon guidelines, reading and [facility] consultation RCHOP was recommended.   Ms. Malecki is accompanied by her husband and mother in law for follow up. She has been having  ongoing issues with her hidradenitis, more so under the R axillae.   I personally reviewed and went over scans and labs with the patient.  States spots and bumps on her abdomen, groin, and axillary regions. States she has a spot on her back that "itches like crazy"  States she has been  getting tired when she tries to do anything.  States she is eating well, better than before.   Notes legs and back starts to burn after standing and doing dishes for about 15 minutes and she has to sit down.   States she has an infection under her right armpit which is spreading. Notes swelling in both axillae. She asked if there was anything she could take besides keflex for her infection.  She notes she has had issues with her skin infection for years. She says she usually just waits for the infection to pass, but this time it was worse and she could barely move her arm.   She requested for one of the nurses to participate on Feb 17 in a local zumba event  to educate people about Hodgkin's Lymphoma.  Overall she feels better.   MEDICAL HISTORY:  Past Medical History:  Diagnosis Date  . Anxiety   . Asthma    had since childhood; has not had to use inhaler or nebulizer for 8-9 years. Been doing well.  Marland Kitchen Dyspnea   . Headache(784.0)    usually controlled c Tylenol when not pregnant, but more painful headaches  during preg. due to hypertension.  . Hidradenitis suppurativa   . Hodgkin lymphoma, nodular lymphocyte predominance ([place]) 04/15/2016  . Hypertension December 2011   Been on [place] since Dec. 2011  . Lymphoma, Hodgkin's ([place])    Stage IV  . Sleep apnea     SURGICAL HISTORY: Past Surgical History:  Procedure Laterality Date  . ADENOIDECTOMY     age 29  . BREAST RECONSTRUCTION  December 2001   breast reduction at age 52  . FULGURATION OF BLADDER TUMOR N/A 07/23/2016   Procedure: CYSTOSCOPY, BIOPSY AND FULGURATION OF BLADDER;  Surgeon: Cleon Gustin, MD;  Location: [facility];  Service: Urology;  Laterality: N/A;  . MASS EXCISION Right 04/07/2016   Procedure: EXCISION/BIOPSY SOFT TISSUE MASS RIGHT THIGH;  Surgeon: Aviva Signs, MD;  Location: [facility];  Service: General;  Laterality: Right;  . port a cath placed    . PORTACATH PLACEMENT Left 04/28/2016   Procedure: INSERTION  PORT-A-CATH;  Surgeon: Aviva Signs, MD;  Location: [facility];  Service: General;  Laterality: Left;    SOCIAL HISTORY: Social History   Social History  . Marital status: Married    Spouse name: N/A  . Number of children: N/A  . Years of education: N/A   Occupational History  . Not on file.   Social History Main Topics  . Smoking status: Never Smoker  . Smokeless tobacco: Never Used  . Alcohol use 0.6 oz/week    1 Glasses of wine per week  . Drug use: No  . Sexual activity: Yes    Birth control/ protection: IUD   Other Topics Concern  . Not on file   Social History Narrative  . No narrative on file  She has two children. One is 4 and one is 13. She also has a dog. She like to dance.  She has been married 8 years, but has been with her husband a total of 18 years.  Patient works from home for [employer].  FAMILY HISTORY: Family History  Problem Relation Age of Onset  . Hypertension Mother   . Cancer Mother   . Hypertension Father   . Heart disease Maternal Grandmother     ALLERGIES:  has No Known Allergies.  MEDICATIONS:  Current Outpatient Prescriptions  Medication Sig Dispense Refill  . allopurinol (ZYLOPRIM) 300 MG tablet Take 1 tablet (300 mg total) by mouth daily. 30 tablet 3  . ALPRAZolam (XANAX) 0.5 MG tablet Take 1 tablet (0.5 mg total) by mouth 3 (three) times daily as needed for anxiety. 30 tablet 1  . amLODipine (NORVASC) 5 MG tablet Take 1 tablet (5 mg total) by mouth daily. 30 tablet 1  . cephALEXin (KEFLEX) 500 MG capsule Take 1 capsule (500 mg total) by mouth 4 (four) times daily. 28 capsule 0  . cyclobenzaprine (FLEXERIL) 10 MG tablet Take 1 tablet (10 mg total) by mouth 3 (three) times daily as needed for muscle spasms. 30 tablet 1  . CYCLOPHOSPHAMIDE IV Inject 1,740 mg into the vein every 21 ( twenty-one) days. Every 21 days     . DOXORUBICIN HCL IV Inject 116 mg into the vein every 21 ( twenty-one) days. Every 21 days     .  HYDROcodone-acetaminophen (NORCO) 5-325 MG tablet Take 1-2 tablets by mouth every 4 (four) hours as needed for moderate pain. 60 tablet 0  . ibuprofen (ADVIL,MOTRIN) 200 MG tablet Take 400-600 mg by mouth every 6 (six) hours as needed for moderate pain.    Marland Kitchen levonorgestrel (MIRENA) 20 MCG/24HR IUD 1 each by Intrauterine route once.    . lidocaine-prilocaine (EMLA) cream Apply to affected area once (Patient taking differently: Apply 1 application topically daily as needed (prior to port access). Apply to affected area once) 30 g 3  . Misc. 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 1 each 0  . ondansetron (ZOFRAN) 8 MG tablet TAKE 1 TABLET BY MOUTH 2 TIMES DAILY AS NEEDED FOR REFRACTORY NAUSEA/VOMITING. START ON DAY 3 AFTER CYCLOPHOSAMIDE. 30 tablet 0  . oxyCODONE-acetaminophen (ROXICET) 5-325 MG tablet Take 1 tablet by mouth every 4 (four) hours as needed for severe pain. 30 tablet 0  . Pegfilgrastim (NEULASTA ONPRO [place]) Inject 6 mg into the skin every 21 ( twenty-one) days. Every 21 days     . potassium chloride SA (K-DUR,KLOR-CON) 20 MEQ tablet Take 1 tablet (20 mEq total) by mouth daily. 30 tablet 0  . predniSONE (DELTASONE) 20 MG tablet Take 4.5 tablets (90 mg total) by mouth daily. Take on days 1-5 of chemotherapy. 45 tablet 6  . prochlorperazine (COMPAZINE) 10 MG tablet Take 1 tablet (10 mg total) by mouth every 6 (six) hours as needed (Nausea or vomiting). 30 tablet 6  . RiTUXimab (RITUXAN IV) Inject 900 mg into the vein every 21 ( twenty-one) days. Every 21 days     . temazepam (RESTORIL) 30 MG capsule Take 1 capsule (30 mg total) by mouth at bedtime. 30 capsule 1  . VINCRISTINE SULFATE IV Inject 1 mg into the vein every 21 ( twenty-one) days. Every 21 days     . Vitamin D, Ergocalciferol, (DRISDOL) 50000 units CAPS capsule Take 50,000 Units by mouth every Monday. Takes on Mondays.     No current facility-administered medications for  this visit.     Review of Systems  Constitutional: Positive for malaise/fatigue (gets tired when she tries to do anything).       Appetite is better than before  HENT: Negative.   Eyes: Negative.   Respiratory: Negative.   Cardiovascular: Negative.        Swelling in both axillary. Notes infection under her right armpit.  Genitourinary: Negative.   Musculoskeletal:       Legs and back starts to burn after standing and doing dishes for about 15 minutes. She has to subsequently sit down.   Skin: Positive for itching (spot on her back that is "itching like crazy").       spots and bumps on her abdomen, groin, and axillary.   Neurological: Positive for tingling and weakness.       Tingling in finger nail beds and toes. Neuropathy worse in the mornings.  Endo/Heme/Allergies: Negative.   Psychiatric/Behavioral: Negative.   All other systems reviewed and are negative. 14 point ROS was done and is otherwise as detailed above or in HPI   PHYSICAL EXAMINATION: ECOG PERFORMANCE STATUS: 1 - Symptomatic but completely ambulatory   Vitals with BMI 09/09/2016  Height   Weight 279 lbs 6 oz  BMI   Systolic 195  Diastolic 95  Pulse 093  Respirations 22    Physical Exam  Constitutional: She is oriented to person, place, and time and well-developed, well-nourished, and in no distress.  Obese.  HENT:  Head: Normocephalic and atraumatic.  Nose: Nose normal.  Mouth/Throat: Oropharynx is clear and moist. No oropharyngeal exudate.  Eyes: Conjunctivae and EOM are normal. Pupils are equal, round, and reactive to light. Right eye exhibits no discharge. Left eye exhibits no discharge. No scleral icterus.  Neck: Normal range of motion. Neck supple. No tracheal deviation present. No thyromegaly present.  Cardiovascular: Normal rate, regular rhythm and normal heart sounds.  Exam reveals no gallop and no friction rub.   No murmur heard. Pulmonary/Chest: Effort normal and breath sounds normal. She has  no wheezes. She has no rales.  Abdominal: Soft. Bowel sounds are normal. She exhibits no distension and no mass. There is no tenderness. There is no rebound and no guarding.  Musculoskeletal: Normal range of motion. She exhibits no edema.  Lymphadenopathy:    She has no cervical adenopathy.  Neurological: She is alert and oriented to person, place, and time. She has normal reflexes. No cranial nerve deficit. Gait normal. Coordination normal.  Skin: Skin is warm and dry. No rash noted.  Superficial comedones noted on the upper right arm and back of neck Right forearm circular area of darkened skin Hidradenitis R axillae is worst area, but L axille and groin regions involved  Psychiatric: Mood, memory, affect and judgment normal.  Nursing note and vitals reviewed.    LABORATORY DATA:  I have reviewed the data as listed Lab Results  Component Value Date   WBC 8.5 08/19/2016   HGB 11.5 (L) 08/19/2016   HCT 35.4 (L) 08/19/2016   MCV 86.3 08/19/2016   PLT 484 (H) 08/19/2016   CMP     Component Value Date/Time   NA 136 08/19/2016 0932   K 3.7 08/19/2016 0932   CL 105 08/19/2016 0932   CO2 25 08/19/2016 0932   GLUCOSE 140 (H) 08/19/2016 0932   BUN 10 08/19/2016 0932   CREATININE 0.66 08/19/2016 0932   CREATININE 0.78 02/16/2013 1721   CALCIUM 9.1 08/19/2016 0932   PROT 6.8 08/19/2016 0932   ALBUMIN 3.5 08/19/2016 0932   AST 22 08/19/2016 0932   ALT 24 08/19/2016 0932   ALKPHOS 43 08/19/2016 0932   BILITOT 0.3 08/19/2016 0932   GFRNONAA >60 08/19/2016  [place] 02/16/2013 1721   GFRAA >60 08/19/2016 0932   GFRAA >89 02/16/2013 1721    PATHOLOGY:     RADIOGRAPHIC STUDIES: I have personally reviewed the radiological images as listed and agreed with the findings in the report. Study Result   CLINICAL DATA:  Subsequent treatment strategy for Hodgkin's lymphoma. Interval chemotherapy.  EXAM: NUCLEAR MEDICINE PET SKULL BASE TO THIGH  TECHNIQUE: 14.0 mCi  F-18 FDG was injected intravenously. Full-ring PET imaging was performed from the skull base to thigh after the radiotracer. CT data was obtained and used for attenuation correction and anatomic localization.  FASTING BLOOD GLUCOSE:  Value: 89 mg/dl  COMPARISON:  07/04/2016  FINDINGS: NECK  No hypermetabolic lymph nodes in the neck.  CHEST  No hypermetabolic mediastinal or hilar nodes. Mild bilateral axillary lymphadenopathy is stable. Hypermetabolic lymph nodes in the right axilla have SUV max of 3.0, without significant change compared to 4.1 previously. Hypermetabolic lymph nodes in the left axilla have SUV max of 3.2, without significant change compared to 3.3 previously. No suspicious pulmonary nodules on the CT scan.  ABDOMEN/PELVIS  No abnormal hypermetabolic activity within the liver, pancreas, adrenal glands, or spleen. No hypermetabolic lymph nodes in the abdomen.  12 mm right external iliac lymph node on image 173/4 is stable in size. This has hypermetabolic activity with SUV max of 4.2 compared to 4.0 previously. 10 mm left external iliac lymph node on image 168/4 is stable in size. This has SUV max of 3.1 compared to 3.5 previously. Less than 1 cm bilateral inguinal lymph nodes show no significant change in size. Previous hypermetabolic activity in the right inguinal region has SUV max of 3.5 compared to 4.5 previously. No new or increased hypermetabolic lymphadenopathy identified.  Moderate diffuse hepatic steatosis appears stable. Pelvic IUD again seen.  SKELETON  No focal hypermetabolic activity to suggest skeletal metastasis. Diffuse marrow hypermetabolism is also seen, which is similar to previous study and consistent with post treatment response.  Other: Multiple hypermetabolic foci are again seen in the subcutaneous tissues of the chest, abdomen, and pelvis, which are new or increased since previous study. New index lesion in  the medial right breast on image 71/4 has SUV max of 7.4. New index lesion in the right anterior abdominal wall on image 91/4 has SUV max of 10.0 .  IMPRESSION: No significant change in mild hypermetabolic lymphadenopathy in bilateral axillary, external iliac, and inguinal chains (Deauville score 3). No new or increased hypermetabolic lymphadenopathy identified .  Increased multifocal hypermetabolic subcutaneous foci within the chest, abdomen and pelvis. This is of uncertain etiology and clinical significance .   Electronically Signed   By: Earle Gell M.D.   On: 09/08/2016 12:10     ASSESSMENT & PLAN:  Hodgkin Lymphoma Nodular Lymphocyte Predominant Disease, Stage IV No B symptoms Cancer related Fatigue Normal LDH Normal ESR Chemotherapy induced neuropathy HTN Hematuria, gross Bladder biopsy 12/15 with chronic inflammation L Hip pain Hidradenitis   PET/CT is reviewed. Subcutaneous nodules are palpable and Zamzam notes that these areas are a typical part of her "skin problem." She notes that this is how "it starts." I am not sure if the axillary, and inguinal adenopathy is related to her hidradenitis.  The external iliac nodes however are concerning for residual disease.  I have recommended proceeding as follows. Referral to Dr. Denna Haggard, he can see her tomorrow. Complete a treatment course for her skin, repeat imaging. If persistent adenopathy is noted. Referral to [facility]  or [facility]. This was discussed with the patient and family today.  Medications refilled.  FROM UP TO DATE: Combination chemotherapy is the main treatment for patients with stage III/IV NLPHL. When treated with alkylator-based regimens, adults with NLPHL respond to treatment in a similar fashion to patients with cHL and identical clinical stage. There are limited data regarding the best chemotherapy regimen for this population, and clinical practice varies. For most patients with advanced disease, we  suggest the use of R-CHOP (rituximab, cyclophosphamide, doxorubicin, vincristine, and prednisone) (table 3) rather than ABVD (Grade 2C). This preference is primarily based upon our clinical experience, the desire to use rituximab in a CD20 positive tumor, and limited data that suggest that ABVD (doxorubicin, bleomycin, vinblastine, and dacarbazine) is not as effective as other alkylator-based regimens. (See 'Stage III/IV disease' above.) ?Approximately 15 to 30 percent of patients with NLPHL relapse. Relapsed disease tends to be responsive to further chemotherapy and/or involved-node or involved-site RT. Patients with relapsed disease should be restaged and treated accordingly.  How I treat nodular lymphocyte predominant Hodgkin lymphoma Ranjana H. Advani and Richard T. Estelline  Blood 2013 [id number]; doi: ThisMLS.nl   At [facility], patients with advanced disease are treated with curative intent. Given the universal expression of CD20, our usual first-line approach incorporates rituximab with regimens used for cHL. For patients who present with B symptoms or abdominal involvement, scenarios in which there may be occult transformation, we favor using RCHOP for 6 cycles NLPHL is rare, with few prospective trials to guide therapy. RT continues to play an integral role in stage I to II disease, as does chemotherapy in advanced stages. The optimal chemotherapy is debatable, and some data suggest that including an alkylating agent may improve efficacy. Although the results with single-agent rituximab in the front-line setting are inferior to conventional therapy, rituximab is a reasonable choice for patients with relapsed disease. The inherent risk of developing an aggressive B-cell lymphoma underscores the importance of long-term follow-up, as well as rebiopsy at relapse. Randomized trials are likely not feasible because of the rarity of NLPHL; however, strategies to consider  testing include "watch and wait" for selected patients, such as young children with limited asymptomatic disease or individuals with totally excised solitary nodal involvement, and combinations of targeted therapy, such as rituximab or other anti-CD20 monoclonal antibodies with conventional therapy. The ultimate goal must be maintenance of excellent PFS and minimization of risk for late effects  Meds ordered this encounter  Medications  . cyclobenzaprine (FLEXERIL) 10 MG tablet    Sig: Take 1 tablet (10 mg total) by mouth 3 (three) times daily as needed for muscle spasms.    Dispense:  30 tablet    Refill:  1  . HYDROcodone-acetaminophen (NORCO) 5-325 MG tablet    Sig: Take 1-2 tablets by mouth every 4 (four) hours as needed for moderate pain.    Dispense:  60 tablet    Refill:  0    This document serves as a record of services personally performed by Ancil Linsey, MD. It was created on her behalf by Shirlean Mylar, a trained medical scribe. The creation of this record is based on the scribe's personal observations and the provider's statements to them. This document has been checked and approved by the attending provider.  I have reviewed the above documentation for accuracy and completeness, and I agree with the above.  Kelby Fam. Penland, M.D.  This note was electronically signed.    Molli Hazard, MD  09/19/2016 9:44 AM

## 2016-09-23 ENCOUNTER — Telehealth (HOSPITAL_COMMUNITY): Payer: Self-pay | Admitting: Emergency Medicine

## 2016-09-23 NOTE — Telephone Encounter (Signed)
Pt called and stated that she had runny nose, congestion that was clear, diarrhea with abdominal pain, sore throat, but no fever.  Pt is currently on antibiotic.  Spoke with Mike Craze NP, she stated for pt to be evaluated at urgent care and move appointment out a week.  New appt is 10/01/2016 at 10:20 am.  Pt verbalized understanding.

## 2016-09-24 ENCOUNTER — Ambulatory Visit (HOSPITAL_COMMUNITY): Payer: 59 | Admitting: Hematology & Oncology

## 2016-09-24 ENCOUNTER — Ambulatory Visit (HOSPITAL_COMMUNITY): Payer: 59 | Admitting: Adult Health

## 2016-10-01 ENCOUNTER — Encounter (HOSPITAL_COMMUNITY): Payer: 59 | Attending: Adult Health | Admitting: Adult Health

## 2016-10-01 ENCOUNTER — Encounter (HOSPITAL_COMMUNITY): Payer: Self-pay | Admitting: Adult Health

## 2016-10-01 VITALS — BP 134/83 | HR 86 | Temp 98.7°F | Resp 24 | Wt 276.2 lb

## 2016-10-01 DIAGNOSIS — C8100 Nodular lymphocyte predominant Hodgkin lymphoma, unspecified site: Secondary | ICD-10-CM

## 2016-10-01 DIAGNOSIS — Z79899 Other long term (current) drug therapy: Secondary | ICD-10-CM | POA: Insufficient documentation

## 2016-10-01 DIAGNOSIS — L732 Hidradenitis suppurativa: Secondary | ICD-10-CM | POA: Diagnosis not present

## 2016-10-01 DIAGNOSIS — C8105 Nodular lymphocyte predominant Hodgkin lymphoma, lymph nodes of inguinal region and lower limb: Secondary | ICD-10-CM | POA: Diagnosis not present

## 2016-10-01 DIAGNOSIS — L299 Pruritus, unspecified: Secondary | ICD-10-CM

## 2016-10-01 DIAGNOSIS — F419 Anxiety disorder, unspecified: Secondary | ICD-10-CM

## 2016-10-01 DIAGNOSIS — R309 Painful micturition, unspecified: Secondary | ICD-10-CM | POA: Insufficient documentation

## 2016-10-01 NOTE — Patient Instructions (Addendum)
[place] at [facility] Discharge Instructions  RECOMMENDATIONS MADE BY THE CONSULTANT AND ANY TEST RESULTS WILL BE SENT TO YOUR REFERRING PHYSICIAN.  You saw Mike Craze, NP, today Apply Emla cream to port when it bothers you. PET scan in 2 weeks. Follow up with Dr. Talbert Cage after PET scan. See Amy at checkout for appointments.  Thank you for choosing [facility] at [facility] to provide your oncology and hematology care.  To afford each patient quality time with our provider, please arrive at least 15 minutes before your scheduled appointment time.    If you have a lab appointment with the [facility] please come in thru the  Main Entrance and check in at the main information desk  You need to re-schedule your appointment should you arrive 10 or more minutes late.  We strive to give you quality time with our providers, and arriving late affects you and other patients whose appointments are after yours.  Also, if you no show three or more times for appointments you may be dismissed from the clinic at the providers discretion.     Again, thank you for choosing [facility].  Our hope is that these requests will decrease the amount of time that you wait before being seen by our physicians.       _____________________________________________________________  Should you have questions after your visit to [facility], please contact our office at [phone] between the hours of 8:30 a.m. and 4:30 p.m.  Voicemails left after 4:30 p.m. will not be returned until the following business day.  For prescription refill requests, have your pharmacy contact our office.       Resources For Cancer Patients and their Caregivers ? American Cancer Society: Can assist with transportation, wigs, general needs, runs Look Good Feel Better.        ([phone] [phone] ? Cancer Care: Provides financial assistance, online support groups,  medication/co-pay assistance.  1-800-813-HOPE 838-295-0466) ? [facility] Assists [place] cancer patients and their families through emotional , educational and financial support.  (763)586-6763 ? [place] DSS Where to apply for food stamps, Medicaid and utility assistance. 984-505-2454 ? [facility]: Transportation to medical appointments. 731-466-0964 ? Social Security Administration: May apply for disability if have a Stage IV cancer. (210)593-3674 773-198-8667 ? [employer], Disability and Transit Services: Assists with nutrition, care and transit needs. [place] Support Programs: @10RELATIVEDAYS @ > Cancer Support Group  2nd Tuesday of the month 1pm-2pm, Journey Room  > Creative Journey  3rd Tuesday of the month 1130am-1pm, Journey Room  > Look Good Feel Better  1st Wednesday of the month 10am-12 noon, Journey Room (Call [place] to register [phone])

## 2016-10-01 NOTE — Progress Notes (Signed)
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 Hypermetabolic enlarged bilateral axillary, bilateral external iliac and bilateral inguinal lymph nodes consistent with lymphoma. 2. Hypermetabolic foci of skin thickening in the bilateral axilla and bilateral lower ventral chest wall, nonspecific, cannot exclude cutaneous lymphoma. 3. Hypermetabolism throughout Waldeyer's ring with associated mucosal thickening on the CT images, which could be inflammatory or due to lymphoma. 4. No splenic enlargement or hypermetabolism. 5. Diffuse hypermetabolism throughout the axial skeleton without discrete bone lesions on the CT images, worrisome for diffuse osseous involvement by lymphoma.      04/29/2016 Imaging    MUGA- Left ventricular ejection fraction equals 66 %.      05/05/2016 -  Chemotherapy    The patient had DOXOrubicin (ADRIAMYCIN) chemo injection 116 mg, 50 mg/m2 = 116 mg, Intravenous,  Once, 3 of 6 cycles  palonosetron (ALOXI) injection 0.25 mg, 0.25 mg, Intravenous,  Once, 3 of 6 cycles  pegfilgrastim (NEULASTA ONPRO KIT) injection 6 mg, 6 mg, Subcutaneous, Once, 3 of 6 cycles  vinCRIStine (ONCOVIN) 2 mg in sodium chloride 0.9 % 50 mL chemo infusion, 2 mg, Intravenous,  Once, 3 of 6 cycles Dose modification: 1 mg (original dose 2 mg, Cycle 3, Reason: Dose not tolerated, Comment: neuropathy)  riTUXimab (RITUXAN) 900 mg in  sodium chloride 0.9 % 250 mL (2.6471 mg/mL) chemo infusion, 375 mg/m2 = 900 mg, Intravenous,  Once, 3 of 6 cycles  cyclophosphamide (CYTOXAN) 1,740 mg in sodium chloride 0.9 % 250 mL chemo infusion, 750 mg/m2 = 1,740 mg, Intravenous,  Once, 3 of 6 cycles  for chemotherapy treatment.        07/02/2016 Procedure    Flexible cystoscopy by Dr. Alyson Ingles (Urology) discovering a 1 cm lesion with clot attached on the posterior wall of bladder.      07/04/2016 PET scan    1. Interval improvement in hypermetabolic nodal activity within the axilla, pelvis and inguinal regions bilaterally consistent with positive response to therapy. Deauville stage III. 2. Interval improvement in the previous demonstrated multifocal subcutaneous activity. There is residual prominent activity in the anterior left perineal region which could be inflammatory.  There is no focal hypermetabolic activity to suggest osseous metastatic disease. Mildly prominent marrow activity is again noted throughout the axial and proximal appendicular skeleton.      09/08/2016 PET scan    No significant change in mild hypermetabolic lymphadenopathy in bilateral axillary, external iliac, and inguinal chains (Deauville score 3). No new or increased hypermetabolic lymphadenopathy identified .  Increased multifocal hypermetabolic subcutaneous foci within the chest, abdomen and pelvis. This is of uncertain etiology and clinical significance .        HISTORY OF PRESENT ILLNESS:  (From Dr. Donald Pore last visit on 09/09/16)    INTERVAL HISTORY:  Ms. [name] presents for follow-up accompanied by her husband.   She saw Dr. Denna Haggard, a  dermatologist in [place], for hidradenitis.  She was on a course of Clindamycin & Rifampin x 14 days; completed antibiotics on 09/26/16.  She reports several new lesions to her bilateral inner legs. The right axilla is improved, but edema is still present.  She has pruritic rash to left scapular  region, "when it flares up, I scratch it and it seems to spread."    The scar of her port-a-cath is bothering her. It is very sensitive to touch. We discussed this was likely secondary to nerve damage and possible keloid of scar; encouraged to try to apply some of her EMLA cream to the scar to see if this may improve her symptoms while the nerves heal.    She is looking forward to a [employer] fundraiser this weekend to help raise money to cover her medical costs and promote lymphoma awareness.  She feels like she has been handling her diagnosis and treatment pretty well; she is frustrated that her energy levels are not completely recovered.  She continues to have some dysgeusia since completing chemotherapy.  She has great support in her family and friends; "I just try to stay busy so I don't have to think about what I'm going through."    REVIEW OF SYSTEMS:  Review of Systems  Constitutional: Positive for fatigue. Negative for chills and fever.       No night sweats   Eyes: Negative.   Respiratory: Negative.  Negative for cough and shortness of breath.   Cardiovascular: Negative.  Negative for chest pain, leg swelling and palpitations.  Gastrointestinal: Negative.  Negative for abdominal pain, blood in stool, constipation, diarrhea, nausea and vomiting.  Genitourinary: Negative.         Menses not returned since completing chemotherapy   Musculoskeletal: Negative.   Skin: Positive for itching and rash.  Hematological: Positive for adenopathy.  Psychiatric/Behavioral: The patient is nervous/anxious.      PAST MEDICAL/SURGICAL HISTORY:  Past Medical History:  Diagnosis Date  . Anxiety   . Asthma    had since childhood; has not had to use inhaler or nebulizer for 8-9 years. Been doing well.  Marland Kitchen Dyspnea   . Headache(784.0)    usually controlled c Tylenol when not pregnant, but more painful headaches  during preg. due to hypertension.  . Hidradenitis suppurativa   . Hodgkin lymphoma,  nodular lymphocyte predominance ([place]) 04/15/2016  . Hypertension December 2011   Been on [place] since Dec. 2011  . Lymphoma, Hodgkin's ([place])    Stage IV  . Sleep apnea    Past Surgical History:  Procedure Laterality Date  . ADENOIDECTOMY     age 62  . BREAST RECONSTRUCTION  December 2001   breast reduction at age 55  . FULGURATION OF BLADDER TUMOR N/A 07/23/2016   Procedure: CYSTOSCOPY, BIOPSY AND FULGURATION OF BLADDER;  Surgeon: Cleon Gustin, MD;  Location: [facility];  Service: Urology;  Laterality: N/A;  . MASS EXCISION Right 04/07/2016   Procedure: EXCISION/BIOPSY SOFT TISSUE MASS RIGHT THIGH;  Surgeon: Aviva Signs, MD;  Location: [facility];  Service: General;  Laterality: Right;  . port a cath placed    . PORTACATH PLACEMENT Left 04/28/2016   Procedure: INSERTION PORT-A-CATH;  Surgeon: Aviva Signs, MD;  Location: [facility];  Service: General;  Laterality: Left;     SOCIAL HISTORY:  Social History   Social History  . Marital status: Married    Spouse name: N/A  . Number of children: N/A  . Years of education: N/A  Occupational History  . Not on file.   Social History Main Topics  . Smoking status: Never Smoker  . Smokeless tobacco: Never Used  . Alcohol use 0.6 oz/week    1 Glasses of wine per week  . Drug use: No  . Sexual activity: Yes    Birth control/ protection: IUD   Other Topics Concern  . Not on file   Social History Narrative  . No narrative on file    FAMILY HISTORY:  Family History  Problem Relation Age of Onset  . Hypertension Mother   . Cancer Mother   . Hypertension Father   . Heart disease Maternal Grandmother     CURRENT MEDICATIONS:  Outpatient Encounter Prescriptions as of 10/01/2016  Medication Sig Note  . allopurinol (ZYLOPRIM) 300 MG tablet Take 1 tablet (300 mg total) by mouth daily.   Marland Kitchen amLODipine (NORVASC) 5 MG tablet Take 1 tablet (5 mg total) by mouth daily.   . cephALEXin (KEFLEX) 500 MG capsule Take 1 capsule (500 mg total)  by mouth 4 (four) times daily.   . cyclobenzaprine (FLEXERIL) 10 MG tablet Take 1 tablet (10 mg total) by mouth 3 (three) times daily as needed for muscle spasms.   . CYCLOPHOSPHAMIDE IV Inject 1,740 mg into the vein every 21 ( twenty-one) days. Every 21 days    . DOXORUBICIN HCL IV Inject 116 mg into the vein every 21 ( twenty-one) days. Every 21 days    . HYDROcodone-acetaminophen (NORCO) 5-325 MG tablet Take 1-2 tablets by mouth every 4 (four) hours as needed for moderate pain.   Marland Kitchen ibuprofen (ADVIL,MOTRIN) 200 MG tablet Take 400-600 mg by mouth every 6 (six) hours as needed for moderate pain.   Marland Kitchen levonorgestrel (MIRENA) 20 MCG/24HR IUD 1 each by Intrauterine route once. 07/16/2016: Placed in 2013  . lidocaine-prilocaine (EMLA) cream Apply to affected area once (Patient taking differently: Apply 1 application topically daily as needed (prior to port access). Apply to affected area once)   . Misc. 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   . ondansetron (ZOFRAN) 8 MG tablet TAKE 1 TABLET BY MOUTH 2 TIMES DAILY AS NEEDED FOR REFRACTORY NAUSEA/VOMITING. START ON DAY 3 AFTER CYCLOPHOSAMIDE.   Marland Kitchen oxyCODONE-acetaminophen (ROXICET) 5-325 MG tablet Take 1 tablet by mouth every 4 (four) hours as needed for severe pain.   Marland Kitchen Pegfilgrastim (NEULASTA ONPRO [place]) Inject 6 mg into the skin every 21 ( twenty-one) days. Every 21 days    . potassium chloride SA (K-DUR,KLOR-CON) 20 MEQ tablet Take 1 tablet (20 mEq total) by mouth daily.   . predniSONE (DELTASONE) 20 MG tablet Take 4.5 tablets (90 mg total) by mouth daily. Take on days 1-5 of chemotherapy.   . prochlorperazine (COMPAZINE) 10 MG tablet Take 1 tablet (10 mg total) by mouth every 6 (six) hours as needed (Nausea or vomiting).   . RiTUXimab (RITUXAN IV) Inject 900 mg into the vein every 21 ( twenty-one) days. Every 21 days    . temazepam (RESTORIL) 30 MG capsule Take 1 capsule (30 mg total) by  mouth at bedtime.   Marland Kitchen VINCRISTINE SULFATE IV Inject 1 mg into the vein every 21 ( twenty-one) days. Every 21 days    . Vitamin D, Ergocalciferol, (DRISDOL) 50000 units CAPS capsule Take 50,000 Units by mouth every Monday. Takes on Mondays.   . [DISCONTINUED] ALPRAZolam (XANAX) 0.5 MG tablet Take 1 tablet (0.5 mg total) by mouth 3 (three) times daily  as needed for anxiety.    No facility-administered encounter medications on file as of 10/01/2016.     ALLERGIES:  No Known Allergies   PHYSICAL EXAM:  ECOG Performance status: 1 - Symptomatic, but largely independent   Vitals:   10/01/16 1041  BP: 134/83  Pulse: 86  Resp: (!) 24  Temp: 98.7 F (37.1 C)   Filed Weights   10/01/16 1041  Weight: 276 lb 3.2 oz (125.3 kg)    Physical Exam  Constitutional: She is oriented to person, place, and time and well-developed, well-nourished, and in no distress.  HENT:  Head: Normocephalic.  Mouth/Throat: Oropharynx is clear and moist. No oropharyngeal exudate.  Eyes: Conjunctivae are normal. Pupils are equal, round, and reactive to light. No scleral icterus.  Neck: Normal range of motion.  Cardiovascular: Normal rate, regular rhythm and normal heart sounds.   Pulmonary/Chest: Effort normal and breath sounds normal. No respiratory distress.  Abdominal: Soft. Bowel sounds are normal. There is no tenderness.  Musculoskeletal: Normal range of motion.  Lymphadenopathy:       Head (right side): No submandibular, no preauricular and no posterior auricular adenopathy present.       Head (left side): No submandibular, no preauricular and no posterior auricular adenopathy present.    She has no cervical adenopathy.    She has axillary adenopathy.       Right: No inguinal and no supraclavicular adenopathy present.       Left: No inguinal and no supraclavicular adenopathy present.  Mild axillary adenopathy bilaterally; right worse than left.   Neurological: She is alert and oriented to person, place,  and time. Gait normal.  Skin: Skin is warm and dry. Rash noted.  Psychiatric: Mood, memory, affect and judgment normal.    Right axilla before antibiotics: (photo provided by patient and included in this documentation with her permission)   Right axilla after antibiotics today:      LABORATORY DATA:  I have reviewed the labs as listed.  CBC    Component Value Date/Time   WBC 8.5 08/19/2016 0932   RBC 4.10 08/19/2016 0932   HGB 11.5 (L) 08/19/2016 0932   HCT 35.4 (L) 08/19/2016 0932   PLT 484 (H) 08/19/2016 0932   MCV 86.3 08/19/2016 0932   MCH 28.0 08/19/2016 0932   MCHC 32.5 08/19/2016 0932   RDW 15.4 08/19/2016 0932   LYMPHSABS 0.8 08/19/2016 0932   MONOABS 1.3 (H) 08/19/2016 0932   EOSABS 0.2 08/19/2016 0932   BASOSABS 0.0 08/19/2016 0932   CMP Latest Ref Rng & Units 08/19/2016 07/28/2016 07/21/2016  Glucose 65 - 99 mg/dL 140(H) 105(H) 149(H)  BUN 6 - 20 mg/dL 10 9 5(L)  Creatinine 0.44 - 1.00 mg/dL 0.66 0.67 0.60  Sodium 135 - 145 mmol/L 136 134(L) 137  Potassium 3.5 - 5.1 mmol/L 3.7 3.4(L) 3.4(L)  Chloride 101 - 111 mmol/L 105 103 105  CO2 22 - 32 mmol/L '25 26 27  '$ Calcium 8.9 - 10.3 mg/dL 9.1 9.1 9.1  Total Protein 6.5 - 8.1 g/dL 6.8 6.8 -  Total Bilirubin 0.3 - 1.2 mg/dL 0.3 0.4 -  Alkaline Phos 38 - 126 U/L 43 38 -  AST 15 - 41 U/L 22 20 -  ALT 14 - 54 U/L 24 24 -    PENDING LABS:    DIAGNOSTIC IMAGING:  PET scan: 09/08/16    PATHOLOGY:  Lymph node biopsy: 04/07/16     ASSESSMENT & PLAN:   Stage IV nodular lymphocyte-predominant Hodgkin  lymphoma:  -Most recent PET scan on 09/08/16 concerning for multifocal hypermetabolic subcutaneous foci in the chest, abdomen, and pelvis.   -Patient's discussed with Dr. Whitney Muse and Dr. Talbert Cage. We will proceed with short-interval repeat PET scan since she has completed antibiotics of hidradenitis.  If PET scan suggestive of residual disease, then we will likely biopsy to confirm and send her to [facility] for further  evaluation and management.   -I discussed these recommendations with Ms. Templeton and her husband today and they are in agreement.    Hidradenitis:  -Completed 2-week course of Clindamycin/Rifampin under the care of Dr. Denna Haggard, dermatologist. Several lesions resistant to antibiotics and new lesions have emerged since completing treatment.   -Unlikely that hidradenitis is leading to increased hypermetabolic activity on PET scan.  -Concerned about possibility of cutaneous lymphoma lesions.   Pruritic rash:  -Uncertain etiology; possibly d/t recent antibiotics -Symptom-management encouraged. Recommended OTC Benadryl ointment or hydrocortisone cream, as tolerated.   Anxiety:  -She has worsening anxiety, particularly when she has to have scans.  She has tried Xanax in the past, but had paradoxical reaction and does not ever want to take Xanax again.  Temazepam has been effective for her, but it makes her sleepy. Recommended she take 1/2 tab before her upcoming PET scan to help alleviate some of her anxiety and bring the other 1/2 tab with her to the appointment to take, if needed.  She agreed to give this a try.         Dispo:  -Short-interval PET scan to re-evaluate disease sometime within the next 2 weeks. -Return to cancer center to see Dr. Talbert Cage to review PET results and plan of care.    All questions were answered to patient's stated satisfaction. Encouraged patient to call with any new concerns or questions before her next visit to the cancer center and we can certain see her sooner, if needed.     Plan of care discussed with Dr. Talbert Cage, who agrees with the above aforementioned.    Mike Craze, NP [place] [phone]

## 2016-10-01 NOTE — Pre-Procedure Instructions (Signed)
Right axilla 

## 2016-10-09 ENCOUNTER — Telehealth (HOSPITAL_COMMUNITY): Payer: Self-pay | Admitting: Oncology

## 2016-10-09 NOTE — Telephone Encounter (Signed)
Peer-to-peer for approval of that imaging is completed.  Imaging is approved.  Approval number is: [id number].  This is good until April 8, Send copy to Dr. Felisa Bonier at [facility] [address] [place] 10/09/2016 2:35 PM

## 2016-10-13 ENCOUNTER — Encounter (HOSPITAL_COMMUNITY)
Admission: RE | Admit: 2016-10-13 | Discharge: 2016-10-13 | Disposition: A | Discharge status: Home / Self Care | Payer: 59 | Source: Ambulatory Visit | Attending: Adult Health | Admitting: Adult Health

## 2016-10-13 DIAGNOSIS — C8100 Nodular lymphocyte predominant Hodgkin lymphoma, unspecified site: Secondary | ICD-10-CM | POA: Diagnosis present

## 2016-10-13 MED ORDER — FLUDEOXYGLUCOSE F - 18 (FDG) INJECTION
13.8500 | Freq: Once | INTRAVENOUS | Status: AC | PRN
  Administered 2016-10-13: 13.85 via INTRAVENOUS

## 2016-10-16 ENCOUNTER — Encounter (HOSPITAL_COMMUNITY): Payer: Self-pay

## 2016-10-16 ENCOUNTER — Encounter (HOSPITAL_COMMUNITY): Payer: 59 | Attending: Hematology & Oncology | Admitting: Oncology

## 2016-10-16 VITALS — BP 156/111 | HR 101 | Temp 98.2°F | Resp 22 | Wt 278.2 lb

## 2016-10-16 DIAGNOSIS — C8100 Nodular lymphocyte predominant Hodgkin lymphoma, unspecified site: Secondary | ICD-10-CM | POA: Diagnosis not present

## 2016-10-16 DIAGNOSIS — L732 Hidradenitis suppurativa: Secondary | ICD-10-CM

## 2016-10-16 DIAGNOSIS — C8105 Nodular lymphocyte predominant Hodgkin lymphoma, lymph nodes of inguinal region and lower limb: Secondary | ICD-10-CM | POA: Insufficient documentation

## 2016-10-16 DIAGNOSIS — Z79899 Other long term (current) drug therapy: Secondary | ICD-10-CM | POA: Insufficient documentation

## 2016-10-16 DIAGNOSIS — R309 Painful micturition, unspecified: Secondary | ICD-10-CM | POA: Insufficient documentation

## 2016-10-16 MED ORDER — HEPARIN SOD (PORK) LOCK FLUSH 100 UNIT/ML IV SOLN
500.0000 [IU] | Freq: Once | INTRAVENOUS | Status: AC
  Administered 2016-10-16: 500 [IU] via INTRAVENOUS

## 2016-10-16 MED ORDER — SODIUM CHLORIDE 0.9% FLUSH
10.0000 mL | INTRAVENOUS | Status: DC | PRN
  Administered 2016-10-16: 10 mL via INTRAVENOUS
  Filled 2016-10-16: qty 10

## 2016-10-16 MED ORDER — HEPARIN SOD (PORK) LOCK FLUSH 100 UNIT/ML IV SOLN
INTRAVENOUS | Status: AC
  Filled 2016-10-16: qty 5

## 2016-10-16 NOTE — Progress Notes (Signed)
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 Hypermetabolic enlarged bilateral axillary, bilateral external iliac and bilateral inguinal lymph nodes consistent with lymphoma. 2. Hypermetabolic foci of skin thickening in the bilateral axilla and bilateral lower ventral chest wall, nonspecific, cannot exclude cutaneous lymphoma. 3. Hypermetabolism throughout Waldeyer's ring with associated mucosal thickening on the CT images, which could be inflammatory or due to lymphoma. 4. No splenic enlargement or hypermetabolism. 5. Diffuse hypermetabolism throughout the axial skeleton without discrete bone lesions on the CT images, worrisome for diffuse osseous involvement by lymphoma.      04/29/2016 Imaging    MUGA- Left ventricular ejection fraction equals 66 %.      05/05/2016 - 08/19/2016 Chemotherapy    The patient had DOXOrubicin (ADRIAMYCIN) chemo injection 116 mg, 50 mg/m2 = 116 mg, Intravenous,  Once, 3 of 6 cycles  palonosetron (ALOXI) injection 0.25 mg, 0.25 mg, Intravenous,  Once, 3 of 6 cycles  pegfilgrastim (NEULASTA ONPRO KIT) injection 6 mg, 6 mg, Subcutaneous, Once, 3 of 6 cycles  vinCRIStine (ONCOVIN) 2 mg in sodium chloride 0.9 % 50 mL chemo infusion, 2 mg, Intravenous,  Once, 3 of 6 cycles Dose modification: 1 mg (original dose 2 mg, Cycle 3, Reason: Dose not tolerated, Comment: neuropathy)  riTUXimab  (RITUXAN) 900 mg in sodium chloride 0.9 % 250 mL (2.6471 mg/mL) chemo infusion, 375 mg/m2 = 900 mg, Intravenous,  Once, 3 of 6 cycles  cyclophosphamide (CYTOXAN) 1,740 mg in sodium chloride 0.9 % 250 mL chemo infusion, 750 mg/m2 = 1,740 mg, Intravenous,  Once, 3 of 6 cycles  for chemotherapy treatment.        07/02/2016 Procedure    Flexible cystoscopy by Dr. Alyson Ingles (Urology) discovering a 1 cm lesion with clot attached on the posterior wall of bladder.      07/04/2016 PET scan    1. Interval improvement in hypermetabolic nodal activity within the axilla, pelvis and inguinal regions bilaterally consistent with positive response to therapy. Deauville stage III. 2. Interval improvement in the previous demonstrated multifocal subcutaneous activity. There is residual prominent activity in the anterior left perineal region which could be inflammatory.  There is no focal hypermetabolic activity to suggest osseous metastatic disease. Mildly prominent marrow activity is again noted throughout the axial and proximal appendicular skeleton.      09/08/2016 PET scan    No significant change in mild hypermetabolic lymphadenopathy in bilateral axillary, external iliac, and inguinal chains (Deauville score 3). No new or increased hypermetabolic lymphadenopathy identified .  Increased multifocal hypermetabolic subcutaneous foci within the chest, abdomen and pelvis. This is of uncertain etiology and clinical significance .        HISTORY OF PRESENT ILLNESS:  (From Dr. Donald Pore last visit on 09/09/16)    INTERVAL HISTORY:  [name] presents for follow-up accompanied by her husband.   She saw Dr.  Tafeen, a dermatologist in [place], for hidradenitis.  She was on a course of Clindamycin & Rifampin x 14 days; completed antibiotics on 09/26/16.  She reports several new lesions to her bilateral inner legs. The right axilla is improved, but edema is still present.  She has pruritic rash to  left scapular region, "when it flares up, I scratch it and it seems to spread."      [name] presents to the clinic accompanied by her husband for continued follow up. I personally reviewed and went over scans with the patient.  She states her hydradenitis is worse again and it has been spreading again after she stopped her antibiotics.   She notes occasional night sweats but they are not as severe as when she was first diagnosed with Hodgkins.   Patient has no other questions or concerns at this time.   REVIEW OF SYSTEMS:  Review of Systems  Constitutional: Negative.        Occassional night sweats.   States she has been doing "so-so".  HENT:  Negative.   Eyes: Negative.   Respiratory: Negative.   Cardiovascular: Negative.   Gastrointestinal: Negative.   Endocrine: Negative.   Genitourinary: Negative.    Musculoskeletal: Negative.   Skin: Positive for itching and rash (spreading all over her legs).       Skin near her port is red and irritated and hurts.  Neurological: Negative.   Hematological: Positive for adenopathy.  All other systems reviewed and are negative.    PAST MEDICAL/SURGICAL HISTORY:  Past Medical History:  Diagnosis Date  . Anxiety   . Asthma    had since childhood; has not had to use inhaler or nebulizer for 8-9 years. Been doing well.  Marland Kitchen Dyspnea   . Headache(784.0)    usually controlled c Tylenol when not pregnant, but more painful headaches  during preg. due to hypertension.  . Hidradenitis suppurativa   . Hodgkin lymphoma, nodular lymphocyte predominance (HCC) 04/15/2016  . Hypertension December 2011   Been on [place] since Dec. 2011  . Lymphoma, Hodgkin's (HCC)    Stage IV  . Sleep apnea    Past Surgical History:  Procedure Laterality Date  . ADENOIDECTOMY     age 62  . BREAST RECONSTRUCTION  December 2001   breast reduction at age 55  . FULGURATION OF BLADDER TUMOR N/A 07/23/2016   Procedure: CYSTOSCOPY, BIOPSY AND FULGURATION OF  BLADDER;  Surgeon: Malen Gauze, MD;  Location: [facility];  Service: Urology;  Laterality: N/A;  . MASS EXCISION Right 04/07/2016   Procedure: EXCISION/BIOPSY SOFT TISSUE MASS RIGHT THIGH;  Surgeon: Franky Macho, MD;  Location: [facility];  Service: General;  Laterality: Right;  . port a cath placed    . PORTACATH PLACEMENT Left 04/28/2016   Procedure: INSERTION PORT-A-CATH;  Surgeon: Franky Macho, MD;  Location: [facility];  Service: General;  Laterality: Left;     SOCIAL HISTORY:  Social History   Social History  . Marital status: Married    Spouse name: N/A  . Number of children: N/A  . Years of education: N/A   Occupational History  . Not on file.   Social History Main Topics  . Smoking status: Never Smoker  . Smokeless tobacco: Never Used  . Alcohol use 0.6 oz/week    1 Glasses of wine per week  . Drug use: No  . Sexual activity: Yes    Birth control/ protection: IUD   Other Topics Concern  . Not on  file   Social History Narrative  . No narrative on file    FAMILY HISTORY:  Family History  Problem Relation Age of Onset  . Hypertension Mother   . Cancer Mother   . Hypertension Father   . Heart disease Maternal Grandmother     CURRENT MEDICATIONS:  Outpatient Encounter Prescriptions as of 10/01/2016  Medication Sig Note  . allopurinol (ZYLOPRIM) 300 MG tablet Take 1 tablet (300 mg total) by mouth daily.   Marland Kitchen amLODipine (NORVASC) 5 MG tablet Take 1 tablet (5 mg total) by mouth daily.   . cephALEXin (KEFLEX) 500 MG capsule Take 1 capsule (500 mg total) by mouth 4 (four) times daily.   . cyclobenzaprine (FLEXERIL) 10 MG tablet Take 1 tablet (10 mg total) by mouth 3 (three) times daily as needed for muscle spasms.   . CYCLOPHOSPHAMIDE IV Inject 1,740 mg into the vein every 21 ( twenty-one) days. Every 21 days    . DOXORUBICIN HCL IV Inject 116 mg into the vein every 21 ( twenty-one) days. Every 21 days    . HYDROcodone-acetaminophen (NORCO) 5-325 MG tablet Take 1-2  tablets by mouth every 4 (four) hours as needed for moderate pain.   Marland Kitchen ibuprofen (ADVIL,MOTRIN) 200 MG tablet Take 400-600 mg by mouth every 6 (six) hours as needed for moderate pain.   Marland Kitchen levonorgestrel (MIRENA) 20 MCG/24HR IUD 1 each by Intrauterine route once. 07/16/2016: Placed in 2013  . lidocaine-prilocaine (EMLA) cream Apply to affected area once (Patient taking differently: Apply 1 application topically daily as needed (prior to port access). Apply to affected area once)   . Misc. 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   . ondansetron (ZOFRAN) 8 MG tablet TAKE 1 TABLET BY MOUTH 2 TIMES DAILY AS NEEDED FOR REFRACTORY NAUSEA/VOMITING. START ON DAY 3 AFTER CYCLOPHOSAMIDE.   Marland Kitchen oxyCODONE-acetaminophen (ROXICET) 5-325 MG tablet Take 1 tablet by mouth every 4 (four) hours as needed for severe pain.   Marland Kitchen Pegfilgrastim (NEULASTA ONPRO Success) Inject 6 mg into the skin every 21 ( twenty-one) days. Every 21 days    . potassium chloride SA (K-DUR,KLOR-CON) 20 MEQ tablet Take 1 tablet (20 mEq total) by mouth daily.   . predniSONE (DELTASONE) 20 MG tablet Take 4.5 tablets (90 mg total) by mouth daily. Take on days 1-5 of chemotherapy.   . prochlorperazine (COMPAZINE) 10 MG tablet Take 1 tablet (10 mg total) by mouth every 6 (six) hours as needed (Nausea or vomiting).   . RiTUXimab (RITUXAN IV) Inject 900 mg into the vein every 21 ( twenty-one) days. Every 21 days    . temazepam (RESTORIL) 30 MG capsule Take 1 capsule (30 mg total) by mouth at bedtime.   Marland Kitchen VINCRISTINE SULFATE IV Inject 1 mg into the vein every 21 ( twenty-one) days. Every 21 days    . Vitamin D, Ergocalciferol, (DRISDOL) 50000 units CAPS capsule Take 50,000 Units by mouth every Monday. Takes on Mondays.   . [DISCONTINUED] ALPRAZolam (XANAX) 0.5 MG tablet Take 1 tablet (0.5 mg total) by mouth 3 (three) times daily as needed for anxiety.    No facility-administered encounter  medications on file as of 10/01/2016.     ALLERGIES:  No Known Allergies   PHYSICAL EXAM:  ECOG Performance status: 1 - Symptomatic, but largely independent   Vitals:   10/16/16 1617  BP: (!) 156/111  Pulse: (!) 101  Resp: (!) 22  Temp: 98.2 F (36.8 C)  Filed Weights   10/16/16 1617  Weight: 278 lb 3.2 oz (126.2 kg)    Physical Exam  Constitutional: She is oriented to person, place, and time and well-developed, well-nourished, and in no distress.  HENT:  Head: Normocephalic.  Mouth/Throat: Oropharynx is clear and moist. No oropharyngeal exudate.  Eyes: Conjunctivae are normal. Pupils are equal, round, and reactive to light. No scleral icterus.  Neck: Normal range of motion.  Cardiovascular: Normal rate, regular rhythm and normal heart sounds.   Pulmonary/Chest: Effort normal and breath sounds normal. No respiratory distress.  Abdominal: Soft. Bowel sounds are normal. There is no tenderness.  Musculoskeletal: Normal range of motion.  Lymphadenopathy:       Head (right side): No submandibular, no preauricular and no posterior auricular adenopathy present.       Head (left side): No submandibular, no preauricular and no posterior auricular adenopathy present.    She has no cervical adenopathy.    She has axillary adenopathy.       Right: No inguinal and no supraclavicular adenopathy present.       Left: No inguinal and no supraclavicular adenopathy present.  Mild axillary adenopathy bilaterally; right worse than left.   Neurological: She is alert and oriented to person, place, and time. Gait normal.  Skin: Skin is warm and dry. Rash noted.  Psychiatric: Mood, memory, affect and judgment normal.    Right axilla before antibiotics: (photo provided by patient and included in this documentation with her permission)     LABORATORY DATA:  I have reviewed the labs as listed.  CBC    Component Value Date/Time   WBC 8.5 08/19/2016 0932   RBC 4.10 08/19/2016 0932   HGB  11.5 (L) 08/19/2016 0932   HCT 35.4 (L) 08/19/2016 0932   PLT 484 (H) 08/19/2016 0932   MCV 86.3 08/19/2016 0932   MCH 28.0 08/19/2016 0932   MCHC 32.5 08/19/2016 0932   RDW 15.4 08/19/2016 0932   LYMPHSABS 0.8 08/19/2016 0932   MONOABS 1.3 (H) 08/19/2016 0932   EOSABS 0.2 08/19/2016 0932   BASOSABS 0.0 08/19/2016 0932   CMP Latest Ref Rng & Units 08/19/2016 07/28/2016 07/21/2016  Glucose 65 - 99 mg/dL 140(H) 105(H) 149(H)  BUN 6 - 20 mg/dL 10 9 5(L)  Creatinine 0.44 - 1.00 mg/dL 0.66 0.67 0.60  Sodium 135 - 145 mmol/L 136 134(L) 137  Potassium 3.5 - 5.1 mmol/L 3.7 3.4(L) 3.4(L)  Chloride 101 - 111 mmol/L 105 103 105  CO2 22 - 32 mmol/L '25 26 27  '$ Calcium 8.9 - 10.3 mg/dL 9.1 9.1 9.1  Total Protein 6.5 - 8.1 g/dL 6.8 6.8 -  Total Bilirubin 0.3 - 1.2 mg/dL 0.3 0.4 -  Alkaline Phos 38 - 126 U/L 43 38 -  AST 15 - 41 U/L 22 20 -  ALT 14 - 54 U/L 24 24 -    PENDING LABS:    DIAGNOSTIC IMAGING:  PET scan: 09/08/16    NUCLEAR MEDICINE PET SKULL BASE TO THIGH 10/13/2016   IMPRESSION: 1. Mild reduction in activity of a left axillary index lymph node hand in a right pelvic sidewall/external iliac lymph node compare to previous. The external iliac node has also reduced in size. Currently the axillary node is Deauville 2 and the right external iliac node is Deauville 3. 2. Various cutaneous foci of activity are no longer present although there is a small new cutaneous focus of activity along the right anterior thoracoabdominal wall with maximum SUV 4.6, likely inflammatory.  3. Diffuse hepatic steatosis. 4. Mild cardiomegaly.   PATHOLOGY:  Lymph node biopsy: 04/07/16     ASSESSMENT & PLAN:   Stage IV nodular lymphocyte-predominant Hodgkin lymphoma:  PET scans reviewed in detail with the patient. Left axillary index lymph node and right pelvic sidewall/external iliac lymph node still show mild activity. Will continue close surveillance at this time. Unclear whether this is  residual disease vs. Continuing response to chemotherapy. Will repeat short interval PET in 3 months for assessment.   Hidradenitis: I advised her to go back to see her dermatologist for recommendations for long term treatment of the hidradenitis. She states she will call to set up an appointment.   RTC in 3 months with repeat PET scan, CBC, CMP, LDH.    All questions were answered to patient's stated satisfaction. Encouraged patient to call with any new concerns or questions before her next visit to the cancer center and we can certain see her sooner, if needed.     This document serves as a record of services personally performed by Twana First, MD. It was created on her behalf by Shirlean Mylar, a trained medical scribe. The creation of this record is based on the scribe's personal observations and the provider's statements to them. This document has been checked and approved by the attending provider.  I have reviewed the above documentation for accuracy and completeness and I agree with the above.

## 2016-10-16 NOTE — Progress Notes (Signed)
1637 10/16/16  [name] presented for Portacath access and flush.  Portacath located left chest wall accessed with  H 20 needle.  Good blood return present. Portacath flushed with 48ml NS and 500U/76ml Heparin and needle removed intact.  Procedure tolerated well and without incident.  Discharged ambulatory from clinic.

## 2016-10-16 NOTE — Patient Instructions (Signed)
[place] at [facility] Discharge Instructions  RECOMMENDATIONS MADE BY THE CONSULTANT AND ANY TEST RESULTS WILL BE SENT TO YOUR REFERRING PHYSICIAN.  You were seen today by Dr. Twana First We will flush your port today Port flush every 6 weeks PET scan before next visit  Follow up in 3 months with port flush and lab work See Amy up front for appointments   Thank you for choosing [place] at [facility] to provide your oncology and hematology care.  To afford each patient quality time with our provider, please arrive at least 15 minutes before your scheduled appointment time.    If you have a lab appointment with the [facility] please come in thru the  Main Entrance and check in at the main information desk  You need to re-schedule your appointment should you arrive 10 or more minutes late.  We strive to give you quality time with our providers, and arriving late affects you and other patients whose appointments are after yours.  Also, if you no show three or more times for appointments you may be dismissed from the clinic at the providers discretion.     Again, thank you for choosing [facility].  Our hope is that these requests will decrease the amount of time that you wait before being seen by our physicians.       _____________________________________________________________  Should you have questions after your visit to [facility], please contact our office at [phone] between the hours of 8:30 a.m. and 4:30 p.m.  Voicemails left after 4:30 p.m. will not be returned until the following business day.  For prescription refill requests, have your pharmacy contact our office.       Resources For Cancer Patients and their Caregivers ? American Cancer Society: Can assist with transportation, wigs, general needs, runs Look Good Feel Better.        (628)631-7964 ? Cancer Care: Provides financial  assistance, online support groups, medication/co-pay assistance.  1-800-813-HOPE (806) 625-0290) ? [place] Assists [place] cancer patients and their families through emotional , educational and financial support.  250-671-3764 ? [place] DSS Where to apply for food stamps, Medicaid and utility assistance. (352) 396-6284 ? RCATS: Transportation to medical appointments. 6397667896 ? Social Security Administration: May apply for disability if have a Stage IV cancer. 731-652-8921 619-500-2539 ? [employer], Disability and Transit Services: Assists with nutrition, care and transit needs. [place] Support Programs: @10RELATIVEDAYS @ > Cancer Support Group  2nd Tuesday of the month 1pm-2pm, Journey Room  > Creative Journey  3rd Tuesday of the month 1130am-1pm, Journey Room  > Look Good Feel Better  1st Wednesday of the month 10am-12 noon, Journey Room (Call [place] [facility] to register [phone])

## 2016-11-21 ENCOUNTER — Encounter (HOSPITAL_COMMUNITY): Payer: Self-pay | Admitting: Adult Health

## 2016-11-21 ENCOUNTER — Other Ambulatory Visit (HOSPITAL_COMMUNITY): Payer: Self-pay

## 2016-11-21 DIAGNOSIS — G4700 Insomnia, unspecified: Secondary | ICD-10-CM

## 2016-11-21 DIAGNOSIS — C8105 Nodular lymphocyte predominant Hodgkin lymphoma, lymph nodes of inguinal region and lower limb: Secondary | ICD-10-CM

## 2016-11-21 MED ORDER — TEMAZEPAM 30 MG PO CAPS: 30.0000 mg | ORAL_CAPSULE | Freq: Every day | ORAL | 1 refills | Status: DC

## 2016-11-21 NOTE — Progress Notes (Signed)
Patient called cancer center requesting refill of Restoril.   [place] Controlled Substance Registry reviewed and refill is appropriate. Paper prescription printed and given to Jaynie Collins, LPN to be sent to patient's pharmacy.   [place] reviewed:     Mike Craze, NP [place] [phone]

## 2016-11-21 NOTE — Telephone Encounter (Signed)
Received refill request from patients pharmacy for temazepam. Reviewed with NP, chart checked and refilled.

## 2016-11-27 ENCOUNTER — Encounter (HOSPITAL_COMMUNITY): Payer: 59 | Attending: Hematology & Oncology

## 2016-11-27 VITALS — BP 149/108 | HR 96 | Temp 98.5°F | Resp 20

## 2016-11-27 DIAGNOSIS — Z79899 Other long term (current) drug therapy: Secondary | ICD-10-CM | POA: Insufficient documentation

## 2016-11-27 DIAGNOSIS — C8105 Nodular lymphocyte predominant Hodgkin lymphoma, lymph nodes of inguinal region and lower limb: Secondary | ICD-10-CM

## 2016-11-27 DIAGNOSIS — C8108 Nodular lymphocyte predominant Hodgkin lymphoma, lymph nodes of multiple sites: Secondary | ICD-10-CM

## 2016-11-27 DIAGNOSIS — R309 Painful micturition, unspecified: Secondary | ICD-10-CM | POA: Insufficient documentation

## 2016-11-27 DIAGNOSIS — M25552 Pain in left hip: Secondary | ICD-10-CM

## 2016-11-27 DIAGNOSIS — Z452 Encounter for adjustment and management of vascular access device: Secondary | ICD-10-CM | POA: Diagnosis not present

## 2016-11-27 DIAGNOSIS — Z95828 Presence of other vascular implants and grafts: Secondary | ICD-10-CM

## 2016-11-27 MED ORDER — HEPARIN SOD (PORK) LOCK FLUSH 100 UNIT/ML IV SOLN
500.0000 [IU] | Freq: Once | INTRAVENOUS | Status: AC
  Administered 2016-11-27: 500 [IU] via INTRAVENOUS

## 2016-11-27 MED ORDER — SODIUM CHLORIDE 0.9% FLUSH
10.0000 mL | Freq: Once | INTRAVENOUS | Status: AC
  Administered 2016-11-27: 10 mL via INTRAVENOUS

## 2016-11-27 MED ORDER — HEPARIN SOD (PORK) LOCK FLUSH 100 UNIT/ML IV SOLN
INTRAVENOUS | Status: AC
  Filled 2016-11-27: qty 5

## 2016-11-27 MED ORDER — HYDROCODONE-ACETAMINOPHEN 5-325 MG PO TABS: 1.0000 | ORAL_TABLET | ORAL | 0 refills | Status: DC | PRN

## 2016-11-27 NOTE — Patient Instructions (Signed)
[place] at [facility] Discharge Instructions  RECOMMENDATIONS MADE BY THE CONSULTANT AND ANY TEST RESULTS WILL BE SENT TO YOUR REFERRING PHYSICIAN.  Port flush done today. [name] RX given for hydrocodone. Return as scheduled.  Thank you for choosing [place] at [facility] to provide your oncology and hematology care.  To afford each patient quality time with our provider, please arrive at least 15 minutes before your scheduled appointment time.    If you have a lab appointment with the [facility] please come in thru the  Main Entrance and check in at the main information desk  You need to re-schedule your appointment should you arrive 10 or more minutes late.  We strive to give you quality time with our providers, and arriving late affects you and other patients whose appointments are after yours.  Also, if you no show three or more times for appointments you may be dismissed from the clinic at the providers discretion.     Again, thank you for choosing [facility].  Our hope is that these requests will decrease the amount of time that you wait before being seen by our physicians.       _____________________________________________________________  Should you have questions after your visit to [facility], please contact our office at [phone] between the hours of 8:30 a.m. and 4:30 p.m.  Voicemails left after 4:30 p.m. will not be returned until the following business day.  For prescription refill requests, have your pharmacy contact our office.       Resources For Cancer Patients and their Caregivers ? American Cancer Society: Can assist with transportation, wigs, general needs, runs Look Good Feel Better.        580-760-2051 ? Cancer Care: Provides financial assistance, online support groups, medication/co-pay assistance.  1-800-813-HOPE 732 794 5227) ? [facility] Assists  [place] cancer patients and their families through emotional , educational and financial support.  367-606-7706 ? [place] DSS Where to apply for food stamps, Medicaid and utility assistance. (401)345-4782 ? RCATS: Transportation to medical appointments. (817) 674-5840 ? Social Security Administration: May apply for disability if have a Stage IV cancer. 954 562 4003 763 097 8761 ? [employer], Disability and Transit Services: Assists with nutrition, care and transit needs. [place] Support Programs: @10RELATIVEDAYS @ > Cancer Support Group  2nd Tuesday of the month 1pm-2pm, Journey Room  > Creative Journey  3rd Tuesday of the month 1130am-1pm, Journey Room  > Look Good Feel Better  1st Wednesday of the month 10am-12 noon, Journey Room (Call [place] to register [phone])

## 2016-11-27 NOTE — Progress Notes (Signed)
Raford Pitcher presented for Portacath access and flush. Proper placement of portacath confirmed by CXR. Portacath located left chest wall accessed with  H 20 needle. Good blood return present. Portacath flushed with 63ml NS and 500U/15ml Heparin and needle removed intact. Procedure without incident. Patient tolerated procedure well.

## 2016-12-26 ENCOUNTER — Telehealth (HOSPITAL_COMMUNITY): Payer: Self-pay | Admitting: *Deleted

## 2016-12-26 NOTE — Telephone Encounter (Signed)
Pt called stating that she had an episode of vaginal bleeding that started on 12/11/16 and lasted for about 8 days, she stated that she had another episode yesterday. Pt states that she just needs to know who she needs to go see her urologist or her gynecologist.  I spoke with Mike Craze NP about above and she stated that the pt should see her gynecologist first if it's vaginal bleeding.   I called the pt and advised her to call her gynecologist and see if they can see her.

## 2017-01-02 ENCOUNTER — Telehealth (HOSPITAL_COMMUNITY): Payer: Self-pay | Admitting: *Deleted

## 2017-01-02 NOTE — Telephone Encounter (Signed)
Pt called stating that she has passed a golf ball sized blood clot. Pt states that she has called her GYN and was given an appointment on June 8th.  I spoke with Mike Craze NP she advised that the pt would need to keep her appointment with her GYN or try to get in with her PCP. She also advised that the pt would not need to go to the ER unless she started having excessive bleeding, dizzy or started feeling bad.  I advised the pt of above and also suggested that she call her GYN office back and see if they could see her sooner.   Pt verbalized understanding and stated that she would call and see if she could get in to see Dr. Lisbeth Renshaw sooner.

## 2017-01-06 ENCOUNTER — Encounter (HOSPITAL_COMMUNITY)
Admission: RE | Admit: 2017-01-06 | Discharge: 2017-01-06 | Disposition: A | Discharge status: Home / Self Care | Payer: 59 | Source: Ambulatory Visit | Attending: Oncology | Admitting: Oncology

## 2017-01-06 DIAGNOSIS — C8100 Nodular lymphocyte predominant Hodgkin lymphoma, unspecified site: Secondary | ICD-10-CM

## 2017-01-06 MED ORDER — FLUDEOXYGLUCOSE F - 18 (FDG) INJECTION
13.2000 | Freq: Once | INTRAVENOUS | Status: AC | PRN
  Administered 2017-01-06: 13.2 via INTRAVENOUS

## 2017-01-07 LAB — GLUCOSE, CAPILLARY: Glucose-Capillary: 89 mg/dL (ref 65–99)

## 2017-01-09 ENCOUNTER — Encounter (HOSPITAL_COMMUNITY): Payer: Self-pay

## 2017-01-09 ENCOUNTER — Encounter (HOSPITAL_BASED_OUTPATIENT_CLINIC_OR_DEPARTMENT_OTHER): Payer: 59 | Admitting: Oncology

## 2017-01-09 ENCOUNTER — Encounter (HOSPITAL_COMMUNITY): Payer: Self-pay | Admitting: Lab

## 2017-01-09 ENCOUNTER — Encounter (HOSPITAL_COMMUNITY): Payer: 59 | Attending: Hematology & Oncology

## 2017-01-09 ENCOUNTER — Ambulatory Visit (HOSPITAL_COMMUNITY): Payer: 59

## 2017-01-09 VITALS — BP 153/100 | HR 110 | Temp 98.5°F | Resp 22 | Wt 276.4 lb

## 2017-01-09 DIAGNOSIS — C8105 Nodular lymphocyte predominant Hodgkin lymphoma, lymph nodes of inguinal region and lower limb: Secondary | ICD-10-CM

## 2017-01-09 DIAGNOSIS — N938 Other specified abnormal uterine and vaginal bleeding: Secondary | ICD-10-CM | POA: Diagnosis not present

## 2017-01-09 DIAGNOSIS — Z79899 Other long term (current) drug therapy: Secondary | ICD-10-CM | POA: Insufficient documentation

## 2017-01-09 DIAGNOSIS — C8100 Nodular lymphocyte predominant Hodgkin lymphoma, unspecified site: Secondary | ICD-10-CM

## 2017-01-09 DIAGNOSIS — R309 Painful micturition, unspecified: Secondary | ICD-10-CM | POA: Diagnosis not present

## 2017-01-09 DIAGNOSIS — L732 Hidradenitis suppurativa: Secondary | ICD-10-CM

## 2017-01-09 LAB — COMPREHENSIVE METABOLIC PANEL
ALT: 20 U/L (ref 14–54)
ANION GAP: 8 (ref 5–15)
AST: 19 U/L (ref 15–41)
Albumin: 3.8 g/dL (ref 3.5–5.0)
Alkaline Phosphatase: 52 U/L (ref 38–126)
BUN: 10 mg/dL (ref 6–20)
CHLORIDE: 102 mmol/L (ref 101–111)
CO2: 25 mmol/L (ref 22–32)
Calcium: 9.2 mg/dL (ref 8.9–10.3)
Creatinine, Ser: 0.73 mg/dL (ref 0.44–1.00)
Glucose, Bld: 86 mg/dL (ref 65–99)
POTASSIUM: 3.4 mmol/L — AB (ref 3.5–5.1)
SODIUM: 135 mmol/L (ref 135–145)
Total Bilirubin: 0.4 mg/dL (ref 0.3–1.2)
Total Protein: 7.3 g/dL (ref 6.5–8.1)

## 2017-01-09 LAB — IRON AND TIBC
IRON: 35 ug/dL (ref 28–170)
Saturation Ratios: 8 % — ABNORMAL LOW (ref 10.4–31.8)
TIBC: 447 ug/dL (ref 250–450)
UIBC: 412 ug/dL

## 2017-01-09 LAB — CBC WITH DIFFERENTIAL/PLATELET
Basophils Absolute: 0 10*3/uL (ref 0.0–0.1)
Basophils Relative: 0 %
EOS ABS: 0.3 10*3/uL (ref 0.0–0.7)
Eosinophils Relative: 4 %
HCT: 38.5 % (ref 36.0–46.0)
Hemoglobin: 12.4 g/dL (ref 12.0–15.0)
LYMPHS ABS: 1.8 10*3/uL (ref 0.7–4.0)
Lymphocytes Relative: 19 %
MCH: 24.6 pg — AB (ref 26.0–34.0)
MCHC: 32.2 g/dL (ref 30.0–36.0)
MCV: 76.2 fL — ABNORMAL LOW (ref 78.0–100.0)
MONO ABS: 0.9 10*3/uL (ref 0.1–1.0)
MONOS PCT: 10 %
Neutro Abs: 6 10*3/uL (ref 1.7–7.7)
Neutrophils Relative %: 67 %
PLATELETS: 272 10*3/uL (ref 150–400)
RBC: 5.05 MIL/uL (ref 3.87–5.11)
RDW: 14.4 % (ref 11.5–15.5)
WBC: 9.1 10*3/uL (ref 4.0–10.5)

## 2017-01-09 LAB — LACTATE DEHYDROGENASE: LDH: 111 U/L (ref 98–192)

## 2017-01-09 LAB — FERRITIN: FERRITIN: 23 ng/mL (ref 11–307)

## 2017-01-09 MED ORDER — SODIUM CHLORIDE 0.9% FLUSH
20.0000 mL | INTRAVENOUS | Status: DC | PRN
  Administered 2017-01-09: 20 mL via INTRAVENOUS
  Filled 2017-01-09: qty 20

## 2017-01-09 MED ORDER — HEPARIN SOD (PORK) LOCK FLUSH 100 UNIT/ML IV SOLN
500.0000 [IU] | Freq: Once | INTRAVENOUS | Status: AC
  Administered 2017-01-09: 500 [IU] via INTRAVENOUS

## 2017-01-09 MED ORDER — HEPARIN SOD (PORK) LOCK FLUSH 100 UNIT/ML IV SOLN
INTRAVENOUS | Status: AC
  Filled 2017-01-09: qty 5

## 2017-01-09 NOTE — Progress Notes (Signed)
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 Hypermetabolic enlarged bilateral axillary, bilateral external iliac and bilateral inguinal lymph nodes consistent with lymphoma. 2. Hypermetabolic foci of skin thickening in the bilateral axilla and bilateral lower ventral chest wall, nonspecific, cannot exclude cutaneous lymphoma. 3. Hypermetabolism throughout Waldeyer's ring with associated mucosal thickening on the CT images, which could be inflammatory or due to lymphoma. 4. No splenic enlargement or hypermetabolism. 5. Diffuse hypermetabolism throughout the axial skeleton without discrete bone lesions on the CT images, worrisome for diffuse osseous involvement by lymphoma.      04/29/2016 Imaging    MUGA- Left ventricular ejection fraction equals 66 %.      05/05/2016 - 08/19/2016 Chemotherapy    The patient had DOXOrubicin (ADRIAMYCIN) chemo injection 116 mg, 50 mg/m2 = 116 mg, Intravenous,  Once, 3 of 6 cycles  palonosetron (ALOXI) injection 0.25 mg, 0.25 mg, Intravenous,  Once, 3 of 6 cycles  pegfilgrastim (NEULASTA ONPRO [place]) injection 6 mg, 6 mg, Subcutaneous, Once, 3 of 6 cycles  vinCRIStine (ONCOVIN) 2 mg in sodium chloride 0.9 % 50 mL chemo infusion, 2 mg, Intravenous,  Once, 3 of 6 cycles Dose modification: 1 mg (original dose 2 mg, Cycle 3, Reason: Dose not tolerated, Comment:  neuropathy)  riTUXimab (RITUXAN) 900 mg in sodium chloride 0.9 % 250 mL (2.6471 mg/mL) chemo infusion, 375 mg/m2 = 900 mg, Intravenous,  Once, 3 of 6 cycles  cyclophosphamide (CYTOXAN) 1,740 mg in sodium chloride 0.9 % 250 mL chemo infusion, 750 mg/m2 = 1,740 mg, Intravenous,  Once, 3 of 6 cycles  for chemotherapy treatment.        07/02/2016 Procedure    Flexible cystoscopy by Dr. Alyson Ingles (Urology) discovering a 1 cm lesion with clot attached on the posterior wall of bladder.      07/04/2016 PET scan    1. Interval improvement in hypermetabolic nodal activity within the axilla, pelvis and inguinal regions bilaterally consistent with positive response to therapy. Deauville stage III. 2. Interval improvement in the previous demonstrated multifocal subcutaneous activity. There is residual prominent activity in the anterior left perineal region which could be inflammatory.  There is no focal hypermetabolic activity to suggest osseous metastatic disease. Mildly prominent marrow activity is again noted throughout the axial and proximal appendicular skeleton.      09/08/2016 PET scan    No significant change in mild hypermetabolic lymphadenopathy in bilateral axillary, external iliac, and inguinal chains (Deauville score 3). No new or increased hypermetabolic lymphadenopathy identified .  Increased multifocal hypermetabolic subcutaneous foci within the chest, abdomen and pelvis. This is of uncertain etiology and clinical significance .      01/06/2017 PET scan    IMPRESSION: 1. Scattered mild adenopathy in the neck, chest, and pelvis with increased metabolic activity, along with newly hypermetabolic cutaneous lesions, primarily Deauville 4 and Deauville 5 activity  as noted above. 2. Geographic hepatic steatosis.        HISTORY OF PRESENT ILLNESS:  (From Dr. Donald Pore last visit on 09/09/16)    INTERVAL HISTORY:  [name] presents for follow-up accompanied by her  husband.  Patient states that she has been doing well. Her hidradenitis has been pretty well controlled, however she does have an active site on her right medial breast. She states that she has been having heavy menstrual bleeding with clots for the past 4 weeks. She has an upcoming appointment with gynecology in June. Otherwise she denies any drenching night sweats, weight loss, loss of appetite, fevers/chills. 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   REVIEW OF SYSTEMS:  Review of Systems  Constitutional: Negative.  Negative for appetite change, chills, fatigue and fever.  HENT:  Negative.  Negative for hearing loss, lump/mass, mouth sores, sore throat and tinnitus.   Eyes: Negative.  Negative for eye problems and icterus.  Respiratory: Negative.  Negative for chest tightness, cough, hemoptysis, shortness of breath and wheezing.   Cardiovascular: Negative.  Negative for chest pain, leg swelling and palpitations.  Gastrointestinal: Negative.  Negative for abdominal distention, abdominal pain, blood in stool, diarrhea, nausea and vomiting.  Endocrine: Negative.  Negative for hot flashes.  Genitourinary: Negative.  Negative for difficulty urinating, frequency and hematuria.   Musculoskeletal: Negative.  Negative for arthralgias and neck pain.  Skin: Negative for itching and rash (spreading all over her legs).       No issues with her port. Hydradenitis lesion on her right breast which has been draining pus.  Neurological: Negative.  Negative for dizziness, headaches and speech difficulty.  Hematological: Negative for adenopathy. Does not bruise/bleed easily.  Psychiatric/Behavioral: Negative for confusion. The patient is not nervous/anxious.   All other systems reviewed and are negative.    PAST MEDICAL/SURGICAL HISTORY:  Past Medical History:  Diagnosis Date  . Anxiety   . Asthma    had since childhood; has not had to use inhaler or nebulizer for 8-9  years. Been doing well.  Marland Kitchen Dyspnea   . Headache(784.0)    usually controlled c Tylenol when not pregnant, but more painful headaches  during preg. due to hypertension.  . Hidradenitis suppurativa   . Hodgkin lymphoma, nodular lymphocyte predominance ([place]) 04/15/2016  . Hypertension December 2011   Been on [place] since Dec. 2011  . Lymphoma, Hodgkin's ([place])    Stage IV  . Sleep apnea    Past Surgical History:  Procedure Laterality Date  . ADENOIDECTOMY     age 89  . BREAST RECONSTRUCTION  December 2001   breast reduction at age 60  . FULGURATION OF BLADDER TUMOR N/A 07/23/2016   Procedure: CYSTOSCOPY, BIOPSY AND FULGURATION OF BLADDER;  Surgeon: Cleon Gustin, MD;  Location: [facility];  Service: Urology;  Laterality: N/A;  . MASS EXCISION Right 04/07/2016   Procedure: EXCISION/BIOPSY SOFT TISSUE MASS RIGHT THIGH;  Surgeon: Aviva Signs, MD;  Location: [facility];  Service: General;  Laterality: Right;  . port a cath placed    . PORTACATH PLACEMENT Left 04/28/2016   Procedure: INSERTION PORT-A-CATH;  Surgeon: Aviva Signs, MD;  Location: [facility];  Service: General;  Laterality: Left;     SOCIAL HISTORY:  Social History   Social History  . Marital status: Married    Spouse name: N/A  . Number of children: N/A  . Years of education: N/A   Occupational History  . Not on file.  Social History Main Topics  . Smoking status: Never Smoker  . Smokeless tobacco: Never Used  . Alcohol use 0.6 oz/week    1 Glasses of wine per week  . Drug use: No  . Sexual activity: Yes    Birth control/ protection: IUD   Other Topics Concern  . Not on file   Social History Narrative  . No narrative on file    FAMILY HISTORY:  Family History  Problem Relation Age of Onset  . Hypertension Mother   . Cancer Mother   . Hypertension Father   . Heart disease Maternal Grandmother     CURRENT MEDICATIONS:  Outpatient Encounter Prescriptions as of 01/09/2017  Medication Sig Note  . allopurinol  (ZYLOPRIM) 300 MG tablet Take 1 tablet (300 mg total) by mouth daily.   Marland Kitchen amLODipine (NORVASC) 5 MG tablet Take 1 tablet (5 mg total) by mouth daily.   . cephALEXin (KEFLEX) 500 MG capsule Take 1 capsule (500 mg total) by mouth 4 (four) times daily.   . cyclobenzaprine (FLEXERIL) 10 MG tablet Take 1 tablet (10 mg total) by mouth 3 (three) times daily as needed for muscle spasms.   . CYCLOPHOSPHAMIDE IV Inject 1,740 mg into the vein every 21 ( twenty-one) days. Every 21 days    . DOXORUBICIN HCL IV Inject 116 mg into the vein every 21 ( twenty-one) days. Every 21 days    . HYDROcodone-acetaminophen (NORCO) 5-325 MG tablet Take 1-2 tablets by mouth every 4 (four) hours as needed for moderate pain.   Marland Kitchen ibuprofen (ADVIL,MOTRIN) 200 MG tablet Take 400-600 mg by mouth every 6 (six) hours as needed for moderate pain.   Marland Kitchen levonorgestrel (MIRENA) 20 MCG/24HR IUD 1 each by Intrauterine route once. 07/16/2016: Placed in 2013  . lidocaine-prilocaine (EMLA) cream Apply to affected area once (Patient taking differently: Apply 1 application topically daily as needed (prior to port access). Apply to affected area once)   . Misc. 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   . ondansetron (ZOFRAN) 8 MG tablet TAKE 1 TABLET BY MOUTH 2 TIMES DAILY AS NEEDED FOR REFRACTORY NAUSEA/VOMITING. START ON DAY 3 AFTER CYCLOPHOSAMIDE.   Marland Kitchen oxyCODONE-acetaminophen (ROXICET) 5-325 MG tablet Take 1 tablet by mouth every 4 (four) hours as needed for severe pain.   Marland Kitchen Pegfilgrastim (NEULASTA ONPRO [place]) Inject 6 mg into the skin every 21 ( twenty-one) days. Every 21 days    . potassium chloride SA (K-DUR,KLOR-CON) 20 MEQ tablet Take 1 tablet (20 mEq total) by mouth daily.   . predniSONE (DELTASONE) 20 MG tablet Take 4.5 tablets (90 mg total) by mouth daily. Take on days 1-5 of chemotherapy.   . prochlorperazine (COMPAZINE) 10 MG tablet Take 1 tablet (10 mg total) by mouth every 6  (six) hours as needed (Nausea or vomiting).   . RiTUXimab (RITUXAN IV) Inject 900 mg into the vein every 21 ( twenty-one) days. Every 21 days    . temazepam (RESTORIL) 30 MG capsule Take 1 capsule (30 mg total) by mouth at bedtime.   Marland Kitchen VINCRISTINE SULFATE IV Inject 1 mg into the vein every 21 ( twenty-one) days. Every 21 days    . Vitamin D, Ergocalciferol, (DRISDOL) 50000 units CAPS capsule Take 50,000 Units by mouth every Monday. Takes on Mondays.    No facility-administered encounter medications on file as of 01/09/2017.     ALLERGIES:  No Known Allergies   PHYSICAL EXAM:  ECOG Performance status: 0-asymptomatic  Physical Exam  Constitutional: She is oriented to person, place, and time and well-developed, well-nourished, and in no distress.  HENT:  Head: Normocephalic.  Mouth/Throat: Oropharynx is clear and moist. No oropharyngeal exudate.  Eyes: Conjunctivae are normal. Pupils are equal, round, and reactive to light. No scleral icterus.  Neck: Normal range of motion.  Cardiovascular: Normal rate, regular rhythm and normal heart sounds.   Pulmonary/Chest: Effort normal and breath sounds normal. No respiratory distress.  Abdominal: Soft. Bowel sounds are normal. There is no tenderness.  Musculoskeletal: Normal range of motion.  Lymphadenopathy:       Head (right side): No submandibular, no preauricular and no posterior auricular adenopathy present.       Head (left side): No submandibular, no preauricular and no posterior auricular adenopathy present.    She has no cervical adenopathy.    She has no axillary adenopathy.       Right: No inguinal and no supraclavicular adenopathy present.       Left: No inguinal and no supraclavicular adenopathy present.  Neurological: She is alert and oriented to person, place, and time. Gait normal.  Skin: Skin is warm and dry. No rash noted.  Open hydradenitis lesion on her right breast, draining.  Psychiatric: Mood, memory, affect and  judgment normal.        LABORATORY DATA:  I have reviewed the labs as listed.  CBC    Component Value Date/Time   WBC 8.5 08/19/2016 0932   RBC 4.10 08/19/2016 0932   HGB 11.5 (L) 08/19/2016 0932   HCT 35.4 (L) 08/19/2016 0932   PLT 484 (H) 08/19/2016 0932   MCV 86.3 08/19/2016 0932   MCH 28.0 08/19/2016 0932   MCHC 32.5 08/19/2016 0932   RDW 15.4 08/19/2016 0932   LYMPHSABS 0.8 08/19/2016 0932   MONOABS 1.3 (H) 08/19/2016 0932   EOSABS 0.2 08/19/2016 0932   BASOSABS 0.0 08/19/2016 0932   CMP Latest Ref Rng & Units 08/19/2016 07/28/2016 07/21/2016  Glucose 65 - 99 mg/dL 140(H) 105(H) 149(H)  BUN 6 - 20 mg/dL 10 9 5(L)  Creatinine 0.44 - 1.00 mg/dL 0.66 0.67 0.60  Sodium 135 - 145 mmol/L 136 134(L) 137  Potassium 3.5 - 5.1 mmol/L 3.7 3.4(L) 3.4(L)  Chloride 101 - 111 mmol/L 105 103 105  CO2 22 - 32 mmol/L '25 26 27  '$ Calcium 8.9 - 10.3 mg/dL 9.1 9.1 9.1  Total Protein 6.5 - 8.1 g/dL 6.8 6.8 -  Total Bilirubin 0.3 - 1.2 mg/dL 0.3 0.4 -  Alkaline Phos 38 - 126 U/L 43 38 -  AST 15 - 41 U/L 22 20 -  ALT 14 - 54 U/L 24 24 -    PENDING LABS:    DIAGNOSTIC IMAGING:  PET scan: 09/08/16    NUCLEAR MEDICINE PET SKULL BASE TO THIGH 10/13/2016   IMPRESSION: 1. Mild reduction in activity of a left axillary index lymph node hand in a right pelvic sidewall/external iliac lymph node compare to previous. The external iliac node has also reduced in size. Currently the axillary node is Deauville 2 and the right external iliac node is Deauville 3. 2. Various cutaneous foci of activity are no longer present although there is a small new cutaneous focus of activity along the right anterior thoracoabdominal wall with maximum SUV 4.6, likely inflammatory. 3. Diffuse hepatic steatosis. 4. Mild cardiomegaly.   PATHOLOGY:  Lymph node biopsy: 04/07/16     ASSESSMENT & PLAN:   1.Stage IV nodular lymphocyte-predominant Hodgkin lymphoma:  PET  scans reviewed in detail with the  patient. Have shown her the PET scan pictures. Her case is very complicated since it is unclear whether her scattered mild lymphadenopathy is due to active Hodgkin's lymphoma versus lymphadenopathy due to her hidradenitis. I will send her to see Dr.Zaneda Dellis Filbert at [facility] for a second opinion.  2. Hidradenitis: Ongoing lesions, varying in severity. Continue follow up with her dermatologist.  3. Dysfunctional uterine bleeding: See gynecology next month as scheduled. Will check her CBC and iron studies today. If she is iron deficient I will start her on oral iron supplements.  RTC in 3 months with repeat CBC, CMP, LDH.    All questions were answered to patient's stated satisfaction. Encouraged patient to call with any new concerns or questions before her next visit to the cancer center and we can certain see her sooner, if needed.     Twana First, MD

## 2017-01-09 NOTE — Progress Notes (Unsigned)
Referral sent to [facility] Dr Dellis Filbert.for 2nd opinion.  They will contact patient with appt.

## 2017-01-09 NOTE — Progress Notes (Signed)
[name] tolerated port flush with lab draw well without complaints or incident. Port accessed with blood drawn for labs ordered by Dr Talbert Cage then flushed with 20 ml NS and 5 ml Heparin easily per protocol then de-accessed. Pt discharged self ambulatory in satisfactory condition accompanied by family member

## 2017-01-09 NOTE — Patient Instructions (Signed)
[name] at [facility] Discharge Instructions  RECOMMENDATIONS MADE BY THE CONSULTANT AND ANY TEST RESULTS WILL BE SENT TO YOUR REFERRING PHYSICIAN.  Portacath flushed after labs drawn today per protocol. Follow-up as scheduled. Call clinic for any questions or concerns  Thank you for choosing [place] at [facility] to provide your oncology and hematology care.  To afford each patient quality time with our provider, please arrive at least 15 minutes before your scheduled appointment time.    If you have a lab appointment with the [facility] please come in thru the  Main Entrance and check in at the main information desk  You need to re-schedule your appointment should you arrive 10 or more minutes late.  We strive to give you quality time with our providers, and arriving late affects you and other patients whose appointments are after yours.  Also, if you no show three or more times for appointments you may be dismissed from the clinic at the providers discretion.     Again, thank you for choosing [facility].  Our hope is that these requests will decrease the amount of time that you wait before being seen by our physicians.       _____________________________________________________________  Should you have questions after your visit to [facility], please contact our office at [phone] between the hours of 8:30 a.m. and 4:30 p.m.  Voicemails left after 4:30 p.m. will not be returned until the following business day.  For prescription refill requests, have your pharmacy contact our office.       Resources For Cancer Patients and their Caregivers ? American Cancer Society: Can assist with transportation, wigs, general needs, runs Look Good Feel Better.        707-311-0511 ? Cancer Care: Provides financial assistance, online support groups, medication/co-pay assistance.  1-800-813-HOPE 910 509 3919) ? [facility] Assists [place] cancer patients and their families through emotional , educational and financial support.  757-401-0449 ? [place] DSS Where to apply for food stamps, Medicaid and utility assistance. 707-317-7207 ? RCATS: Transportation to medical appointments. 561-815-3729 ? Social Security Administration: May apply for disability if have a Stage IV cancer. (417)359-9420 6827360098 ? [employer], Disability and Transit Services: Assists with nutrition, care and transit needs. [place] Support Programs: @10RELATIVEDAYS @ > Cancer Support Group  2nd Tuesday of the month 1pm-2pm, Journey Room  > Creative Journey  3rd Tuesday of the month 1130am-1pm, Journey Room  > Look Good Feel Better  1st Wednesday of the month 10am-12 noon, Journey Room (Call [place] to register [phone])

## 2017-01-09 NOTE — Patient Instructions (Signed)
[facility] at [facility] Discharge Instructions  RECOMMENDATIONS MADE BY THE CONSULTANT AND ANY TEST RESULTS WILL BE SENT TO YOUR REFERRING PHYSICIAN.  You saw Dr. Zhou today.  Thank you for choosing [facility] at [facility] to provide your oncology and hematology care.  To afford each patient quality time with our provider, please arrive at least 15 minutes before your scheduled appointment time.    If you have a lab appointment with the Cancer Center please come in thru the  Main Entrance and check in at the main information desk  You need to re-schedule your appointment should you arrive 10 or more minutes late.  We strive to give you quality time with our providers, and arriving late affects you and other patients whose appointments are after yours.  Also, if you no show three or more times for appointments you may be dismissed from the clinic at the providers discretion.     Again, thank you for choosing [facility].  Our hope is that these requests will decrease the amount of time that you wait before being seen by our physicians.       _____________________________________________________________  Should you have questions after your visit to [facility], please contact our office at [phone] between the hours of 8:30 a.m. and 4:30 p.m.  Voicemails left after 4:30 p.m. will not be returned until the following business day.  For prescription refill requests, have your pharmacy contact our office.       Resources For Cancer Patients and their Caregivers ? American Cancer Society: Can assist with transportation, wigs, general needs, runs Look Good Feel Better.        1-888-227-6333 ? Cancer Care: Provides financial assistance, online support groups, medication/co-pay assistance.  1-800-813-HOPE (4673) ? [facility] Assists [place] cancer patients and their families through  emotional , educational and financial support.  336-427-4357 ? [place] DSS Where to apply for food stamps, Medicaid and utility assistance. 336-342-1394 ? RCATS: Transportation to medical appointments. 336-347-2287 ? Social Security Administration: May apply for disability if have a Stage IV cancer. 336-342-7796 1-800-772-1213 ? [place] Aging, Disability and Transit Services: Assists with nutrition, care and transit needs. 336-349-2343  Cancer Center Support Programs: @10RELATIVEDAYS@ > Cancer Support Group  2nd Tuesday of the month 1pm-2pm, Journey Room  > Creative Journey  3rd Tuesday of the month 1130am-1pm, Journey Room  > Look Good Feel Better  1st Wednesday of the month 10am-12 noon, Journey Room (Call American Cancer [employer] to register 1-800-395-5775)    

## 2017-01-13 ENCOUNTER — Encounter (HOSPITAL_COMMUNITY): Payer: Self-pay | Admitting: Oncology

## 2017-01-13 ENCOUNTER — Other Ambulatory Visit (HOSPITAL_COMMUNITY): Payer: Self-pay | Admitting: Oncology

## 2017-01-13 DIAGNOSIS — E611 Iron deficiency: Secondary | ICD-10-CM

## 2017-01-13 HISTORY — DX: Iron deficiency: E61.1

## 2017-01-21 ENCOUNTER — Encounter (HOSPITAL_COMMUNITY): Payer: Self-pay

## 2017-01-21 ENCOUNTER — Encounter (HOSPITAL_COMMUNITY): Payer: 59 | Attending: Hematology & Oncology

## 2017-01-21 VITALS — BP 138/91 | HR 102 | Temp 98.6°F | Resp 18

## 2017-01-21 DIAGNOSIS — Z79899 Other long term (current) drug therapy: Secondary | ICD-10-CM | POA: Insufficient documentation

## 2017-01-21 DIAGNOSIS — E611 Iron deficiency: Secondary | ICD-10-CM | POA: Diagnosis not present

## 2017-01-21 DIAGNOSIS — C8100 Nodular lymphocyte predominant Hodgkin lymphoma, unspecified site: Secondary | ICD-10-CM

## 2017-01-21 DIAGNOSIS — R309 Painful micturition, unspecified: Secondary | ICD-10-CM | POA: Insufficient documentation

## 2017-01-21 DIAGNOSIS — C8105 Nodular lymphocyte predominant Hodgkin lymphoma, lymph nodes of inguinal region and lower limb: Secondary | ICD-10-CM | POA: Insufficient documentation

## 2017-01-21 MED ORDER — HEPARIN SOD (PORK) LOCK FLUSH 100 UNIT/ML IV SOLN
500.0000 [IU] | Freq: Once | INTRAVENOUS | Status: AC | PRN
  Administered 2017-01-21: 500 [IU]
  Filled 2017-01-21: qty 5

## 2017-01-21 MED ORDER — SODIUM CHLORIDE 0.9 % IV SOLN
510.0000 mg | Freq: Once | INTRAVENOUS | Status: AC
  Administered 2017-01-21: 510 mg via INTRAVENOUS
  Filled 2017-01-21: qty 17

## 2017-01-21 MED ORDER — SODIUM CHLORIDE 0.9 % IV SOLN
Freq: Once | INTRAVENOUS | Status: AC
  Administered 2017-01-21: 14:00:00 via INTRAVENOUS

## 2017-01-21 NOTE — Patient Instructions (Signed)
[facility] at [facility] Discharge Instructions  RECOMMENDATIONS MADE BY THE CONSULTANT AND ANY TEST RESULTS WILL BE SENT TO YOUR REFERRING PHYSICIAN.  Feraheme given today Follow up as scheduled.  Thank you for choosing [facility] at [facility] to provide your oncology and hematology care.  To afford each patient quality time with our provider, please arrive at least 15 minutes before your scheduled appointment time.    If you have a lab appointment with the Cancer Center please come in thru the  Main Entrance and check in at the main information desk  You need to re-schedule your appointment should you arrive 10 or more minutes late.  We strive to give you quality time with our providers, and arriving late affects you and other patients whose appointments are after yours.  Also, if you no show three or more times for appointments you may be dismissed from the clinic at the providers discretion.     Again, thank you for choosing [facility].  Our hope is that these requests will decrease the amount of time that you wait before being seen by our physicians.       _____________________________________________________________  Should you have questions after your visit to  Cancer Center, please contact our office at [phone] between the hours of 8:30 a.m. and 4:30 p.m.  Voicemails left after 4:30 p.m. will not be returned until the following business day.  For prescription refill requests, have your pharmacy contact our office.       Resources For Cancer Patients and their Caregivers ? American Cancer Society: Can assist with transportation, wigs, general needs, runs Look Good Feel Better.        1-888-227-6333 ? Cancer Care: Provides financial assistance, online support groups, medication/co-pay assistance.  1-800-813-HOPE (4673) ? [facility] Assists [place] cancer patients and  their families through emotional , educational and financial support.  336-427-4357 ? [place] DSS Where to apply for food stamps, Medicaid and utility assistance. 336-342-1394 ? RCATS: Transportation to medical appointments. 336-347-2287 ? Social Security Administration: May apply for disability if have a Stage IV cancer. 336-342-7796 1-800-772-1213 ? [place] Aging, Disability and Transit Services: Assists with nutrition, care and transit needs. 336-349-2343  Cancer Center Support Programs: @10RELATIVEDAYS@ > Cancer Support Group  2nd Tuesday of the month 1pm-2pm, Journey Room  > Creative Journey  3rd Tuesday of the month 1130am-1pm, Journey Room  > Look Good Feel Better  1st Wednesday of the month 10am-12 noon, Journey Room (Call [employer] to register 1-800-395-5775)   

## 2017-01-21 NOTE — Progress Notes (Signed)
Feraheme given today per orders. Patient toleratated it well without issues. Vitals stable and discharged home from clinic ambulatory. Follow up as scheduled.

## 2017-02-02 ENCOUNTER — Encounter (HOSPITAL_COMMUNITY): Payer: Self-pay | Admitting: Emergency Medicine

## 2017-02-02 NOTE — Progress Notes (Signed)
Pt wanted to know about the referral that had been placed for lymphoma specialist at [place] with amy and it has been sent to Dr Dellis Filbert at [facility] and I looked up the number [phone] and give it to the pt to call and check on the referral.

## 2017-02-04 ENCOUNTER — Ambulatory Visit: Payer: 59 | Admitting: Urology

## 2017-03-06 ENCOUNTER — Encounter (HOSPITAL_COMMUNITY): Payer: 59

## 2017-04-10 ENCOUNTER — Ambulatory Visit (HOSPITAL_COMMUNITY): Payer: 59

## 2017-04-10 ENCOUNTER — Encounter (HOSPITAL_COMMUNITY): Payer: 59

## 2017-05-18 HISTORY — PX: ABDOMINAL HYSTERECTOMY: SHX81

## 2017-09-18 IMAGING — PT NM PET TUM IMG RESTAG (PS) SKULL BASE T - THIGH
1 of 9 series · 1 of 25 positions shown · non-contrast
Comparison: Multiple exams, including 09/08/2016

CLINICAL DATA: Subsequent treatment strategy for nodular
lymphocytic predominant Hodgkin's lymphoma.

EXAM:
NUCLEAR MEDICINE PET SKULL BASE TO THIGH
TECHNIQUE: 13.9 mCi F-18 FDG was injected intravenously. Full-ring PET imaging
was performed from the skull base to thigh after the radiotracer. CT
data was obtained and used for attenuation correction and anatomic
localization.
FASTING BLOOD GLUCOSE:  Value: 95 Mg/dl

[Series 2: ac ct sk_thigh 5.0 hd_fov · axial · 5.0mm · 1.52mm/px · 1 of 221 slices shown]
[im 1/221  brain]
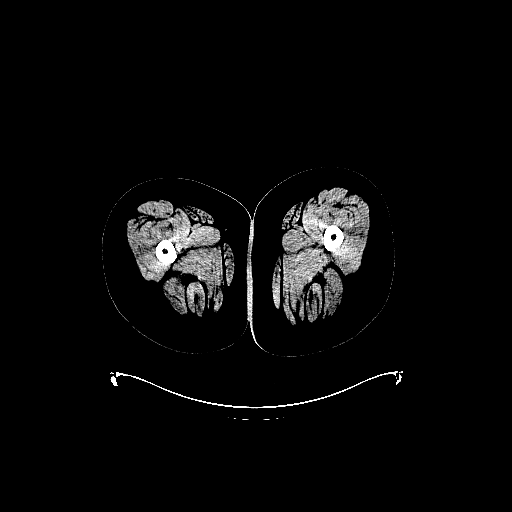

[1 of 25 positions shown; findings below may reference images not displayed]

FINDINGS: NECK

No hypermetabolic lymph nodes in the neck.

CHEST

An index left axillary node measuring 1.1 cm in short axis on image
65/2 (formerly 1.1 cm) has a maximum SUV of 2.6 (formerly 3.2).
Other axillary lymph node similarly have low grade activity inter
generally fairly similar to prior. Background mediastinal blood pool
activity SUV is 3.4.

There is previously a cutaneous and subcutaneous lesion medially
along the right breast, this has resolved. A smaller cutaneous focus
of metabolic activity along the right anterior thoracoabdominal wall
is thought to be inflammatory.

Mild cardiomegaly.

ABDOMEN/PELVIS

No abnormal hypermetabolic activity within the liver, pancreas,
adrenal glands, or spleen. No hypermetabolic lymph nodes in the
abdomen or pelvis.

Diffuse hepatic steatosis.

Background hepatic activity SUV 3.8.

A right pelvic sidewall/external iliac lymph node measuring 9 mm in
short axis on image 179/2 (formerly 1.2 cm) has a maximum SUV of
(formerly 4.2). Density in the subcutaneous tissues in the right
inguinal region, postoperative findings in the right inguinal
region. Small bilateral inguinal lymph nodes are not currently
hypermetabolic.

As before, an IUD projects within the uterus.

SKELETON

Generalized reduction and bone marrow metabolic activity compared to
prior.
IMPRESSION: 1. Mild reduction in activity of a left axillary index lymph node
hand in a right pelvic sidewall/external iliac lymph node compare to
previous. The external iliac node has also reduced in size.
Currently the axillary node is [HOSPITAL] 2 and the right external
iliac node is [HOSPITAL] 3.
2. Various cutaneous foci of activity are no longer present although
there is a small new cutaneous focus of activity along the right
anterior thoracoabdominal wall with maximum SUV 4.6, likely
inflammatory.
3. Diffuse hepatic steatosis.
4. Mild cardiomegaly.

## 2018-01-20 ENCOUNTER — Encounter (HOSPITAL_COMMUNITY): Payer: Self-pay | Admitting: Lab

## 2018-01-20 ENCOUNTER — Encounter (HOSPITAL_COMMUNITY): Payer: Self-pay | Admitting: Internal Medicine

## 2018-01-20 ENCOUNTER — Inpatient Hospital Stay (HOSPITAL_COMMUNITY): Payer: 59 | Attending: Internal Medicine | Admitting: Internal Medicine

## 2018-01-20 VITALS — BP 153/102 | HR 98 | Temp 98.1°F | Resp 18 | Wt 275.0 lb

## 2018-01-20 DIAGNOSIS — I10 Essential (primary) hypertension: Secondary | ICD-10-CM | POA: Insufficient documentation

## 2018-01-20 DIAGNOSIS — C8100 Nodular lymphocyte predominant Hodgkin lymphoma, unspecified site: Secondary | ICD-10-CM | POA: Insufficient documentation

## 2018-01-20 DIAGNOSIS — Z79899 Other long term (current) drug therapy: Secondary | ICD-10-CM | POA: Diagnosis not present

## 2018-01-20 DIAGNOSIS — F419 Anxiety disorder, unspecified: Secondary | ICD-10-CM | POA: Insufficient documentation

## 2018-01-20 DIAGNOSIS — E661 Drug-induced obesity: Secondary | ICD-10-CM | POA: Diagnosis not present

## 2018-01-20 DIAGNOSIS — L732 Hidradenitis suppurativa: Secondary | ICD-10-CM | POA: Diagnosis not present

## 2018-01-20 DIAGNOSIS — G629 Polyneuropathy, unspecified: Secondary | ICD-10-CM

## 2018-01-20 DIAGNOSIS — J45909 Unspecified asthma, uncomplicated: Secondary | ICD-10-CM | POA: Diagnosis not present

## 2018-01-20 DIAGNOSIS — R0602 Shortness of breath: Secondary | ICD-10-CM | POA: Insufficient documentation

## 2018-01-20 DIAGNOSIS — Z9221 Personal history of antineoplastic chemotherapy: Secondary | ICD-10-CM

## 2018-01-20 NOTE — Progress Notes (Unsigned)
Referral to Dr Merlene Laughter.  Records faxed on 6/5

## 2018-01-20 NOTE — Progress Notes (Signed)
Diagnosis Nodular lymphocyte predominant Hodgkin lymphoma, unspecified body region [facility]) - Plan: CBC with Differential/Platelet, Comprehensive metabolic panel, Lactate dehydrogenase, Ferritin, Protein electrophoresis, serum, Beta 2 microglobulin, serum, NM PET Image Restag (PS) Skull Base To Thigh, Sedimentation rate  Staging Cancer Staging No matching staging information was found for the patient.  Assessment and Plan: 1.Stage IV nodular lymphocyte-predominant Hodgkin lymphoma.  She was previously followed by Dr. Talbert Cage.  Pt was treated with 6 cycles of RCHOP and was last treated in 2018.  She had residual adenopathy at completion of chemotherapy and It was unclear if findings were reactive adenopathy due to hidradenitis versus recurrent lymphoma so pt was referred by Dr Talbert Cage to [facility].  [facility] at [facility] was done 01/06/2017 and showed  IMPRESSION: 1. Scattered mild adenopathy in the neck, chest, and pelvis with increased metabolic activity, along with newly hypermetabolic cutaneous lesions, primarily Deauville 4 and Deauville 5 activity as noted above. 2. Geographic hepatic steatosis.  She had PET scan done at [facility] on 03/27/2017 and showed stable small adenopathy that was felt to be reactive.  She was referred to general surgery who felt nodes were reactive so biopsy was deferred.    She was last seen at [facility] on 12/14/2017 by Dr Cassell Clement.  She was recommended for repeat imaging in July 2019 and labs.    Pt has not been seen by Dr. Talbert Cage since 12/2016.  She presents today for evaluation and desires to have scans and labs done here.  I have discussed with her we will order imaging and labs for 02/2018 and she will follow-up to go over results.  She should notify the office if she has any problems before her next visit.  Labs done at [facility] on 11/2017 reviewed and WNL.    2. Neuropathy.  Pt is complaining of numbness from neck to fingertips.  She had not been on therapy since 2018.  Will  refer to neurology for evaluation and if negative workup will consider neurontin, cymbalta or lyrica.    3. Hidradenitis.  She reports this has improved since seeing dermatology.  Right axilla has more prominent lesions than left.  Pt should continue to follow-up with dermatology.    4. Dysfunctional uterine bleeding.  This has resolved as pt has undergone hysterectomy.  Recent labs done 11/2017 at [facility] showed HB of 11.3..  Will repeat labs in 02/2018.    5.  Health maintenance.  Pt reports she has undergone mammograms in the past.    6.  HTN.  BP is 153/102.  Pt should follow-up with PCP.    Interval history:    (From Dr. Donald Pore last visit on 09/09/16)   Current Status:  Pt is seen today for follow-up.  She was seen at [facility] in 11/2017.  She reports she has not been on treatment since therapy was completed in 2018.  She  Has been having labs and scans done and is recommended for scans in July 2019.  She would like to reestablish at [facility] as it is getting difficult to get to her appts at [facility].   She denies any fevers, chills, night sweats and has noted no adenopathy.  She reports some tingling in her neck to her fingertips.    PATHOLOGY:  Lymph node biopsy: 04/07/16 /    Hodgkin lymphoma, nodular lymphocyte predominance ([place])   04/07/2016 Procedure    Excisional lymph node biopsy of right thigh      04/11/2016 Pathology Results    Nodular  lymphocyte predominant hodgkin Lymphoma      04/23/2016 Miscellaneous    Normal ESR, LDH      04/25/2016 PET scan    1. Hypermetabolic enlarged bilateral axillary, bilateral external iliac and bilateral inguinal lymph nodes consistent with lymphoma. 2. Hypermetabolic foci of skin thickening in the bilateral axilla and bilateral lower ventral chest wall, nonspecific, cannot exclude cutaneous lymphoma. 3. Hypermetabolism throughout Waldeyer's ring with associated mucosal thickening on the CT images, which could be inflammatory or due to  lymphoma. 4. No splenic enlargement or hypermetabolism. 5. Diffuse hypermetabolism throughout the axial skeleton without discrete bone lesions on the CT images, worrisome for diffuse osseous involvement by lymphoma.      04/29/2016 Imaging    MUGA- Left ventricular ejection fraction equals 66 %.      05/05/2016 - 08/19/2016 Chemotherapy    The patient had DOXOrubicin (ADRIAMYCIN) chemo injection 116 mg, 50 mg/m2 = 116 mg, Intravenous,  Once, 3 of 6 cycles  palonosetron (ALOXI) injection 0.25 mg, 0.25 mg, Intravenous,  Once, 3 of 6 cycles  pegfilgrastim (NEULASTA ONPRO KIT) injection 6 mg, 6 mg, Subcutaneous, Once, 3 of 6 cycles  vinCRIStine (ONCOVIN) 2 mg in sodium chloride 0.9 % 50 mL chemo infusion, 2 mg, Intravenous,  Once, 3 of 6 cycles Dose modification: 1 mg (original dose 2 mg, Cycle 3, Reason: Dose not tolerated, Comment: neuropathy)  riTUXimab (RITUXAN) 900 mg in sodium chloride 0.9 % 250 mL (2.6471 mg/mL) chemo infusion, 375 mg/m2 = 900 mg, Intravenous,  Once, 3 of 6 cycles  cyclophosphamide (CYTOXAN) 1,740 mg in sodium chloride 0.9 % 250 mL chemo infusion, 750 mg/m2 = 1,740 mg, Intravenous,  Once, 3 of 6 cycles  for chemotherapy treatment.        07/02/2016 Procedure    Flexible cystoscopy by Dr. Alyson Ingles (Urology) discovering a 1 cm lesion with clot attached on the posterior wall of bladder.      07/04/2016 PET scan    1. Interval improvement in hypermetabolic nodal activity within the axilla, pelvis and inguinal regions bilaterally consistent with positive response to therapy. Deauville stage III. 2. Interval improvement in the previous demonstrated multifocal subcutaneous activity. There is residual prominent activity in the anterior left perineal region which could be inflammatory.  There is no focal hypermetabolic activity to suggest osseous metastatic disease. Mildly prominent marrow activity is again noted throughout the axial and proximal appendicular  skeleton.      09/08/2016 PET scan    No significant change in mild hypermetabolic lymphadenopathy in bilateral axillary, external iliac, and inguinal chains (Deauville score 3). No new or increased hypermetabolic lymphadenopathy identified .  Increased multifocal hypermetabolic subcutaneous foci within the chest, abdomen and pelvis. This is of uncertain etiology and clinical significance .      01/06/2017 PET scan    IMPRESSION: 1. Scattered mild adenopathy in the neck, chest, and pelvis with increased metabolic activity, along with newly hypermetabolic cutaneous lesions, primarily Deauville 4 and Deauville 5 activity as noted above. 2. Geographic hepatic steatosis.        Problem List Patient Active Problem List   Diagnosis Date Noted  . Iron deficiency [E61.1] 01/13/2017  . Hodgkin lymphoma, nodular lymphocyte predominance ([place]) [C81.00] 04/15/2016  . Encounter for gynecological examination [O53.664] 06/01/2014  . Hypertension [I10] 02/02/2013  . Abscess of breast, right [N61.1] 09/01/2012  . Hypertension in pregnancy, preeclampsia, severe, antepartum [O14.10] 12/11/2011  . Chronic hypertension complicating or reason for care during pregnancy [O10.919] 12/11/2011    Past Medical  History Past Medical History:  Diagnosis Date  . Anxiety   . Asthma    had since childhood; has not had to use inhaler or nebulizer for 8-9 years. Been doing well.  Marland Kitchen Dyspnea   . Headache(784.0)    usually controlled c Tylenol when not pregnant, but more painful headaches  during preg. due to hypertension.  . Hidradenitis suppurativa   . Hodgkin lymphoma, nodular lymphocyte predominance ([place]) 04/15/2016  . Hypertension December 2011   Been on Aldomet since Dec. 2011  . Iron deficiency 01/13/2017  . Lymphoma, Hodgkin's ([place])    Stage IV  . Sleep apnea     Past Surgical History Past Surgical History:  Procedure Laterality Date  . ADENOIDECTOMY     age 74  . BREAST RECONSTRUCTION   December 2001   breast reduction at age 11  . FULGURATION OF BLADDER TUMOR N/A 07/23/2016   Procedure: CYSTOSCOPY, BIOPSY AND FULGURATION OF BLADDER;  Surgeon: Cleon Gustin, MD;  Location: [facility];  Service: Urology;  Laterality: N/A;  . MASS EXCISION Right 04/07/2016   Procedure: EXCISION/BIOPSY SOFT TISSUE MASS RIGHT THIGH;  Surgeon: Aviva Signs, MD;  Location: [facility];  Service: General;  Laterality: Right;  . port a cath placed    . PORTACATH PLACEMENT Left 04/28/2016   Procedure: INSERTION PORT-A-CATH;  Surgeon: Aviva Signs, MD;  Location: [facility];  Service: General;  Laterality: Left;    Family History Family History  Problem Relation Age of Onset  . Hypertension Mother   . Cancer Mother   . Hypertension Father   . Heart disease Maternal Grandmother      Social History  reports that she has never smoked. She has never used smokeless tobacco. She reports that she drinks about 0.6 oz of alcohol per week. She reports that she does not use drugs.  Medications  Current Outpatient Medications:  .  amLODipine (NORVASC) 5 MG tablet, Take 1 tablet (5 mg total) by mouth daily., Disp: 30 tablet, Rfl: 1 .  cephALEXin (KEFLEX) 500 MG capsule, Take 1 capsule (500 mg total) by mouth 4 (four) times daily., Disp: 28 capsule, Rfl: 0 .  cyclobenzaprine (FLEXERIL) 10 MG tablet, Take 1 tablet (10 mg total) by mouth 3 (three) times daily as needed for muscle spasms., Disp: 30 tablet, Rfl: 1 .  HYDROcodone-acetaminophen (NORCO) 5-325 MG tablet, Take 1-2 tablets by mouth every 4 (four) hours as needed for moderate pain., Disp: 60 tablet, Rfl: 0 .  ibuprofen (ADVIL,MOTRIN) 200 MG tablet, Take 400-600 mg by mouth every 6 (six) hours as needed for moderate pain., Disp: , Rfl:  .  levonorgestrel (MIRENA) 20 MCG/24HR IUD, 1 each by Intrauterine route once., Disp: , Rfl:  .  Misc. 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  ondansetron (ZOFRAN) 8 MG tablet, TAKE 1 TABLET BY MOUTH 2 TIMES DAILY AS NEEDED FOR REFRACTORY NAUSEA/VOMITING. START ON DAY 3 AFTER CYCLOPHOSAMIDE., Disp: 30 tablet, Rfl: 0 .  oxyCODONE-acetaminophen (ROXICET) 5-325 MG tablet, Take 1 tablet by mouth every 4 (four) hours as needed for severe pain., Disp: 30 tablet, Rfl: 0 .  potassium chloride SA (K-DUR,KLOR-CON) 20 MEQ tablet, Take 1 tablet (20 mEq total) by mouth daily., Disp: 30 tablet, Rfl: 0 .  temazepam (RESTORIL) 30 MG capsule, Take 1 capsule (30 mg total) by mouth at bedtime., Disp: 30 capsule, Rfl: 1 .  Vitamin D, Ergocalciferol, (DRISDOL) 50000 units CAPS capsule,  Take 50,000 Units by mouth every Monday. Takes on Mondays., Disp: , Rfl:   Allergies Patient has no known allergies.  Review of Systems Review of Systems - Oncology ROS as per HPI otherwise 12 point ROS is negative.   Physical Exam  Vitals Wt Readings from Last 3 Encounters:  01/20/18 275 lb (124.7 kg)  01/09/17 276 lb 6.4 oz (125.4 kg)  10/16/16 278 lb 3.2 oz (126.2 kg)   Temp Readings from Last 3 Encounters:  01/20/18 98.1 F (36.7 C) (Oral)  01/21/17 98.6 F (37 C) (Oral)  01/09/17 98.5 F (36.9 C) (Oral)   BP Readings from Last 3 Encounters:  01/20/18 (!) 153/102  01/21/17 (!) 138/91  01/09/17 (!) 153/100   Pulse Readings from Last 3 Encounters:  01/20/18 98  01/21/17 (!) 102  01/09/17 (!) 110    Constitutional: Well-developed, well-nourished, and in no distress.   HENT: Head: Normocephalic and atraumatic.  Mouth/Throat: No oropharyngeal exudate. Mucosa moist. Eyes: Pupils are equal, round, and reactive to light. Conjunctivae are normal. No scleral icterus.  Neck: Normal range of motion. Neck supple. No JVD present.  Cardiovascular: Normal rate, regular rhythm and normal heart sounds.  Exam reveals no gallop and no friction rub.   No murmur heard. Pulmonary/Chest: Effort normal and breath sounds normal. No respiratory distress. No  wheezes.No rales.  Abdominal: Soft. Bowel sounds are normal. No distension. There is no tenderness. There is no guarding.  Musculoskeletal: No edema or tenderness.  Lymphadenopathy: No cervical, axillary or supraclavicular adenopathy. Hidradenitis noted in right axilla with scarring noted.   Neurological: Alert and oriented to person, place, and time. No cranial nerve deficit.  Skin: Skin is warm and dry. No rash noted. No erythema. No pallor.  Psychiatric: Affect and judgment normal.   Labs No visits with results within 3 Day(s) from this visit.  Latest known visit with results is:  Infusion on 01/09/2017  Component Date Value Ref Range Status  . WBC 01/09/2017 9.1  4.0 - 10.5 K/uL Final  . RBC 01/09/2017 5.05  3.87 - 5.11 MIL/uL Final  . Hemoglobin 01/09/2017 12.4  12.0 - 15.0 g/dL Final  . HCT 01/09/2017 38.5  36.0 - 46.0 % Final  . MCV 01/09/2017 76.2* 78.0 - 100.0 fL Final  . MCH 01/09/2017 24.6* 26.0 - 34.0 pg Final  . MCHC 01/09/2017 32.2  30.0 - 36.0 g/dL Final  . RDW 01/09/2017 14.4  11.5 - 15.5 % Final  . Platelets 01/09/2017 272  150 - 400 K/uL Final  . Neutrophils Relative % 01/09/2017 67  % Final  . Neutro Abs 01/09/2017 6.0  1.7 - 7.7 K/uL Final  . Lymphocytes Relative 01/09/2017 19  % Final  . Lymphs Abs 01/09/2017 1.8  0.7 - 4.0 K/uL Final  . Monocytes Relative 01/09/2017 10  % Final  . Monocytes Absolute 01/09/2017 0.9  0.1 - 1.0 K/uL Final  . Eosinophils Relative 01/09/2017 4  % Final  . Eosinophils Absolute 01/09/2017 0.3  0.0 - 0.7 K/uL Final  . Basophils Relative 01/09/2017 0  % Final  . Basophils Absolute 01/09/2017 0.0  0.0 - 0.1 K/uL Final  . Sodium 01/09/2017 135  135 - 145 mmol/L Final  . Potassium 01/09/2017 3.4* 3.5 - 5.1 mmol/L Final  . Chloride 01/09/2017 102  101 - 111 mmol/L Final  . CO2 01/09/2017 25  22 - 32 mmol/L Final  . Glucose, Bld 01/09/2017 86  65 - 99 mg/dL Final  . BUN 01/09/2017 10  6 -  20 mg/dL Final  . Creatinine, Ser 01/09/2017 0.73   0.44 - 1.00 mg/dL Final  . Calcium 01/09/2017 9.2  8.9 - 10.3 mg/dL Final  . Total Protein 01/09/2017 7.3  6.5 - 8.1 g/dL Final  . Albumin 01/09/2017 3.8  3.5 - 5.0 g/dL Final  . AST 01/09/2017 19  15 - 41 U/L Final  . ALT 01/09/2017 20  14 - 54 U/L Final  . Alkaline Phosphatase 01/09/2017 52  38 - 126 U/L Final  . Total Bilirubin 01/09/2017 0.4  0.3 - 1.2 mg/dL Final  . GFR calc non Af Amer 01/09/2017 >60  >60 mL/min Final  . GFR calc Af Amer 01/09/2017 >60  >60 mL/min Final   Comment: (NOTE) The eGFR has been calculated using the CKD EPI equation. This calculation has not been validated in all clinical situations. eGFR's persistently <60 mL/min signify possible Chronic Kidney Disease.   . Anion gap 01/09/2017 8  5 - 15 Final  . LDH 01/09/2017 111  98 - 192 U/L Final  . Iron 01/09/2017 35  28 - 170 ug/dL Final  . TIBC 01/09/2017 447  250 - 450 ug/dL Final  . Saturation Ratios 01/09/2017 8* 10.4 - 31.8 % Final  . UIBC 01/09/2017 412  ug/dL Final   Performed at [facility], [place] [address], [place], [place] [address]  . Ferritin 01/09/2017 23  11 - 307 ng/mL Final   Performed at [facility], [place] [address], [place],  [address]     Pathology Orders Placed This Encounter  Procedures  . NM PET Image Restag (PS) Skull Base To Thigh    Standing Status:   Future    Standing Expiration Date:   01/20/2019    Order Specific Question:   If indicated for the ordered procedure, I authorize the administration of a radiopharmaceutical per Radiology protocol    Answer:   Yes    Order Specific Question:   Is the patient pregnant?    Answer:   No    Order Specific Question:   Preferred imaging location?    Answer:   [facility]    Order Specific Question:   Radiology Contrast Protocol - do NOT remove file path    Answer:   [email or web address]  . CBC with Differential/Platelet    Standing Status:   Future    Standing Expiration Date:    01/21/2019  . Comprehensive metabolic panel    Standing Status:   Future    Standing Expiration Date:   01/21/2019  . Lactate dehydrogenase    Standing Status:   Future    Standing Expiration Date:   01/21/2019  . Ferritin    Standing Status:   Future    Standing Expiration Date:   01/21/2019  . Protein electrophoresis, serum    Standing Status:   Future    Standing Expiration Date:   01/21/2019  . Beta 2 microglobulin, serum    Standing Status:   Future    Standing Expiration Date:   01/21/2019  . Sedimentation rate    Standing Status:   Future    Standing Expiration Date:   01/21/2019       Zoila Shutter MD

## 2018-01-20 NOTE — Patient Instructions (Signed)
[facility]  Discharge Instructions:  You were seen by Dr. Higgs today _______________________________________________________________  Thank you for choosing [facility] to provide your oncology and hematology care.  To afford each patient quality time with our providers, please arrive at least 15 minutes before your scheduled appointment.  You need to re-schedule your appointment if you arrive 10 or more minutes late.  We strive to give you quality time with our providers, and arriving late affects you and other patients whose appointments are after yours.  Also, if you no show three or more times for appointments you may be dismissed from the clinic.  Again, thank you for choosing [facility]. Our hope is that these requests will allow you access to exceptional care and in a timely manner. _______________________________________________________________  If you have questions after your visit, please contact our office at [phone] between the hours of 8:30 a.m. and 5:00 p.m. Voicemails left after 4:30 p.m. will not be returned until the following business day. _______________________________________________________________  For prescription refill requests, have your pharmacy contact our office. _______________________________________________________________  Recommendations made by the consultant and any test results will be sent to your referring physician. _______________________________________________________________ 

## 2018-01-22 ENCOUNTER — Other Ambulatory Visit (HOSPITAL_COMMUNITY): Payer: Self-pay | Admitting: Internal Medicine

## 2018-01-22 ENCOUNTER — Telehealth (HOSPITAL_COMMUNITY): Payer: Self-pay

## 2018-01-22 NOTE — Telephone Encounter (Signed)
Per Dr. Walden Field request, I called and notified patient that her labs were back and her hgb was good but her ferritin was a little low, so if she wants to she can IV iron. Patient does want to receive the IV iron. Notified Dr. Walden Field to place the order and scheduling to make appt.

## 2018-01-24 ENCOUNTER — Other Ambulatory Visit: Payer: Self-pay

## 2018-01-24 ENCOUNTER — Emergency Department (HOSPITAL_COMMUNITY)
Admission: EM | Admit: 2018-01-24 | Discharge: 2018-01-24 | Disposition: A | Discharge status: Home / Self Care | Payer: 59 | Attending: Emergency Medicine | Admitting: Emergency Medicine

## 2018-01-24 ENCOUNTER — Encounter (HOSPITAL_COMMUNITY): Payer: Self-pay | Admitting: *Deleted

## 2018-01-24 DIAGNOSIS — I10 Essential (primary) hypertension: Secondary | ICD-10-CM | POA: Insufficient documentation

## 2018-01-24 DIAGNOSIS — Z79899 Other long term (current) drug therapy: Secondary | ICD-10-CM | POA: Insufficient documentation

## 2018-01-24 DIAGNOSIS — Z8571 Personal history of Hodgkin lymphoma: Secondary | ICD-10-CM | POA: Insufficient documentation

## 2018-01-24 DIAGNOSIS — R519 Headache, unspecified: Secondary | ICD-10-CM

## 2018-01-24 DIAGNOSIS — R51 Headache: Secondary | ICD-10-CM | POA: Insufficient documentation

## 2018-01-24 DIAGNOSIS — J45909 Unspecified asthma, uncomplicated: Secondary | ICD-10-CM | POA: Diagnosis not present

## 2018-01-24 DIAGNOSIS — R110 Nausea: Secondary | ICD-10-CM | POA: Diagnosis not present

## 2018-01-24 LAB — COMPREHENSIVE METABOLIC PANEL
ALK PHOS: 43 U/L (ref 38–126)
ALT: 9 U/L — AB (ref 14–54)
AST: 23 U/L (ref 15–41)
Albumin: 3.6 g/dL (ref 3.5–5.0)
Anion gap: 7 (ref 5–15)
BUN: 11 mg/dL (ref 6–20)
CO2: 29 mmol/L (ref 22–32)
Calcium: 9.7 mg/dL (ref 8.9–10.3)
Chloride: 106 mmol/L (ref 101–111)
Creatinine, Ser: 0.83 mg/dL (ref 0.44–1.00)
Glucose, Bld: 92 mg/dL (ref 65–99)
Potassium: 3.7 mmol/L (ref 3.5–5.1)
SODIUM: 142 mmol/L (ref 135–145)
Total Bilirubin: 0.2 mg/dL — ABNORMAL LOW (ref 0.3–1.2)
Total Protein: 7 g/dL (ref 6.5–8.1)

## 2018-01-24 LAB — CBC WITH DIFFERENTIAL/PLATELET
Basophils Absolute: 0 10*3/uL (ref 0.0–0.1)
Basophils Relative: 0 %
Eosinophils Absolute: 0.3 10*3/uL (ref 0.0–0.7)
Eosinophils Relative: 3 %
HEMATOCRIT: 37.4 % (ref 36.0–46.0)
Hemoglobin: 11.1 g/dL — ABNORMAL LOW (ref 12.0–15.0)
LYMPHS ABS: 2 10*3/uL (ref 0.7–4.0)
LYMPHS PCT: 21 %
MCH: 22.5 pg — AB (ref 26.0–34.0)
MCHC: 29.7 g/dL — ABNORMAL LOW (ref 30.0–36.0)
MCV: 75.7 fL — AB (ref 78.0–100.0)
Monocytes Absolute: 0.9 10*3/uL (ref 0.1–1.0)
Monocytes Relative: 9 %
NEUTROS ABS: 6.1 10*3/uL (ref 1.7–7.7)
Neutrophils Relative %: 67 %
Platelets: 427 10*3/uL — ABNORMAL HIGH (ref 150–400)
RBC: 4.94 MIL/uL (ref 3.87–5.11)
RDW: 15.5 % (ref 11.5–15.5)
WBC: 9.3 10*3/uL (ref 4.0–10.5)

## 2018-01-24 MED ORDER — TRAMADOL HCL 50 MG PO TABS: ORAL_TABLET | ORAL | 0 refills | Status: DC

## 2018-01-24 MED ORDER — KETOROLAC TROMETHAMINE 30 MG/ML IJ SOLN
30.0000 mg | Freq: Once | INTRAMUSCULAR | Status: AC
  Administered 2018-01-24: 30 mg via INTRAVENOUS
  Filled 2018-01-24: qty 1

## 2018-01-24 NOTE — ED Provider Notes (Signed)
[facility] EMERGENCY DEPARTMENT Provider Note   CSN: [id number] Arrival date & time: 01/24/18  1827     History   Chief Complaint Chief Complaint  Patient presents with  . Headache    HPI [name] is a 32 y.o. female.  Patient complains of severe headache.  She also has some nausea.  She has not taken anything for headache.  Patient states last time this happened she had a low hemoglobin  The history is provided by the patient. No language interpreter was used.  Headache   This is a recurrent problem. The current episode started 2 days ago. The problem occurs constantly. The headache is associated with nothing. The pain is located in the bilateral region. The quality of the pain is described as dull. The pain is at a severity of 7/10. The pain is moderate. The pain does not radiate.    Past Medical History:  Diagnosis Date  . Anxiety   . Asthma    had since childhood; has not had to use inhaler or nebulizer for 8-9 years. Been doing well.  Marland Kitchen Dyspnea   . Headache(784.0)    usually controlled c Tylenol when not pregnant, but more painful headaches  during preg. due to hypertension.  . Hidradenitis suppurativa   . 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 Hypertension December 2011   Been on Aldomet since Dec. 2011  . Iron deficiency 01/13/2017  . Lymphoma, Hodgkin's ([place])    Stage IV  . Sleep apnea     Patient Active Problem List   Diagnosis Date Noted  . Iron deficiency 01/13/2017  . 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 Encounter for gynecological examination 06/01/2014  . Hypertension 02/02/2013  . Abscess of breast, right 09/01/2012  . Hypertension in pregnancy, preeclampsia, severe, antepartum 12/11/2011  . Chronic hypertension complicating or reason for care during pregnancy 12/11/2011    Past Surgical History:  Procedure Laterality Date  . ABDOMINAL HYSTERECTOMY  05/2017  . ADENOIDECTOMY      age 62  . BREAST RECONSTRUCTION  December 2001   breast reduction at age 77  . FULGURATION OF BLADDER TUMOR N/A 07/23/2016   Procedure: CYSTOSCOPY, BIOPSY AND FULGURATION OF BLADDER;  Surgeon: Cleon Gustin, MD;  Location: [facility];  Service: Urology;  Laterality: N/A;  . MASS EXCISION Right 04/07/2016   Procedure: EXCISION/BIOPSY SOFT TISSUE MASS RIGHT THIGH;  Surgeon: Aviva Signs, MD;  Location: [facility];  Service: General;  Laterality: Right;  . port a cath placed    . PORTACATH PLACEMENT Left 04/28/2016   Procedure: INSERTION PORT-A-CATH;  Surgeon: Aviva Signs, MD;  Location: [facility];  Service: General;  Laterality: Left;     OB History    Gravida  3   Para  2   Term  1   Preterm  1   AB  1   Living  2     SAB  1   TAB  0   Ectopic  0   Multiple  0   Live Births  1            Home Medications    Prior to Admission medications   Medication Sig Start Date End Date Taking? Authorizing Provider  amLODipine (NORVASC) 5 MG tablet Take 1 tablet (5 mg total) by mouth daily. 06/16/16   Penland, Kelby Fam, MD  cephALEXin (KEFLEX) 500 MG capsule Take 1 capsule (500 mg total) by mouth 4 (four) times daily.  09/05/16   Baird Cancer, PA-C  cyclobenzaprine (FLEXERIL) 10 MG tablet Take 1 tablet (10 mg total) by mouth 3 (three) times daily as needed for muscle spasms. 09/09/16   Penland, Kelby Fam, MD  HYDROcodone-acetaminophen (NORCO) 5-325 MG tablet Take 1-2 tablets by mouth every 4 (four) hours as needed for moderate pain. 11/27/16   Baird Cancer, PA-C  ibuprofen (ADVIL,MOTRIN) 200 MG tablet Take 400-600 mg by mouth every 6 (six) hours as needed for moderate pain.    [provider]  levonorgestrel (MIRENA) 20 MCG/24HR IUD 1 each by Intrauterine route once.    [provider]  Misc. 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 07/28/16   Baird Cancer, PA-C  ondansetron (ZOFRAN) 8 MG  tablet TAKE 1 TABLET BY MOUTH 2 TIMES DAILY AS NEEDED FOR REFRACTORY NAUSEA/VOMITING. START ON DAY 3 AFTER CYCLOPHOSAMIDE. 06/15/16   Penland, Kelby Fam, MD  oxyCODONE-acetaminophen (ROXICET) 5-325 MG tablet Take 1 tablet by mouth every 4 (four) hours as needed for severe pain. 07/23/16   McKenzie, Candee Furbish, MD  potassium chloride SA (K-DUR,KLOR-CON) 20 MEQ tablet Take 1 tablet (20 mEq total) by mouth daily. 07/07/16   Baird Cancer, PA-C  temazepam (RESTORIL) 30 MG capsule Take 1 capsule (30 mg total) by mouth at bedtime. 11/21/16   Holley Bouche, NP  traMADol Veatrice Bourbon) 50 MG tablet Take one every 6-8 hours for headache not helped by motrin 01/24/18   Milton Ferguson, MD  Vitamin D, Ergocalciferol, (DRISDOL) 50000 units CAPS capsule Take 50,000 Units by mouth every Monday. Takes on Mondays.    [provider]  allopurinol (ZYLOPRIM) 300 MG tablet Take 1 tablet (300 mg total) by mouth daily. 04/29/16 01/13/17  Penland, Kelby Fam, MD  prochlorperazine (COMPAZINE) 10 MG tablet Take 1 tablet (10 mg total) by mouth every 6 (six) hours as needed (Nausea or vomiting). 04/29/16 01/13/17  Penland, Kelby Fam, MD    Family History Family History  Problem Relation Age of Onset  . Hypertension Mother   . Cancer Mother   . Hypertension Father   . Heart disease Maternal Grandmother     Social History Social History   Tobacco Use  . Smoking status: Never Smoker  . Smokeless tobacco: Never Used  Substance Use Topics  . Alcohol use: Yes    Alcohol/week: 0.6 oz    Types: 1 Glasses of wine per week  . Drug use: No     Allergies   Patient has no known allergies.   Review of Systems Review of Systems  Constitutional: Negative for appetite change and fatigue.  HENT: Negative for congestion, ear discharge and sinus pressure.   Eyes: Negative for discharge.  Respiratory: Negative for cough.   Cardiovascular: Negative for chest pain.  Gastrointestinal: Negative for abdominal pain and  diarrhea.  Genitourinary: Negative for frequency and hematuria.  Musculoskeletal: Negative for back pain.  Skin: Negative for rash.  Neurological: Positive for headaches. Negative for seizures.  Psychiatric/Behavioral: Negative for hallucinations.     Physical Exam Updated Vital Signs BP (!) 147/100 (BP Location: Right Arm)   Pulse 97   Temp 98.6 F (37 C) (Oral)   Resp 18   Ht 5\' 3"  (1.6 m)   Wt 122.5 kg (270 lb)   LMP 02/02/2013   SpO2 100%   BMI 47.83 kg/m   Physical Exam  Constitutional: She is oriented to person, place, and time. She appears well-developed.  HENT:  Head: Normocephalic.  Eyes: Conjunctivae and EOM are normal. No scleral icterus.  Neck: Neck supple. No thyromegaly present.  Cardiovascular: Normal rate and regular rhythm. Exam reveals no gallop and no friction rub.  No murmur heard. Pulmonary/Chest: No stridor. She has no wheezes. She has no rales. She exhibits no tenderness.  Abdominal: She exhibits no distension. There is no tenderness. There is no rebound.  Musculoskeletal: Normal range of motion. She exhibits no edema.  Lymphadenopathy:    She has no cervical adenopathy.  Neurological: She is oriented to person, place, and time. She exhibits normal muscle tone. Coordination normal.  Skin: No rash noted. No erythema.  Psychiatric: She has a normal mood and affect. Her behavior is normal.     ED Treatments / Results  Labs (all labs ordered are listed, but only abnormal results are displayed) Labs Reviewed  CBC WITH DIFFERENTIAL/PLATELET - Abnormal; Notable for the following components:      Result Value   Hemoglobin 11.1 (*)    MCV 75.7 (*)    MCH 22.5 (*)    MCHC 29.7 (*)    Platelets 427 (*)    All other components within normal limits  COMPREHENSIVE METABOLIC PANEL - Abnormal; Notable for the following components:   ALT 9 (*)    Total Bilirubin 0.2 (*)    All other components within normal limits    EKG None  Radiology No results  found.  Procedures Procedures (including critical care time)  Medications Ordered in ED Medications  ketorolac (TORADOL) 30 MG/ML injection 30 mg (30 mg Intravenous Given 01/24/18 1943)     Initial Impression / Assessment and Plan / ED Course  I have reviewed the triage vital signs and the nursing notes.  Pertinent labs & imaging results that were available during my care of the patient were reviewed by me and considered in my medical decision making (see chart for details).     Patient improved with Toradol.  CBC and chemistries unremarkable except for mild anemia.  Patient given Ultram to take if the headache returns and is to follow-up with her PCP.  Final Clinical Impressions(s) / ED Diagnoses   Final diagnoses:  Bad headache    ED Discharge Orders        Ordered    traMADol (ULTRAM) 50 MG tablet     01/24/18 2036       Milton Ferguson, MD 01/24/18 2038

## 2018-01-24 NOTE — Discharge Instructions (Addendum)
Follow-up with your family doctor if any problems 

## 2018-01-24 NOTE — ED Triage Notes (Signed)
Pt c/o headache, mainly on right side of head that radiates down back of right side of neck that started this morning. Pt also c/o numbness in both hands, mainly on right that started this morning when she woke up at 0745. Pt reports "seeing spots".

## 2018-01-27 ENCOUNTER — Inpatient Hospital Stay (HOSPITAL_COMMUNITY): Payer: 59

## 2018-01-27 ENCOUNTER — Encounter (HOSPITAL_COMMUNITY): Payer: Self-pay

## 2018-01-27 VITALS — BP 140/83 | HR 100 | Temp 98.1°F | Resp 18

## 2018-01-27 DIAGNOSIS — C8100 Nodular lymphocyte predominant Hodgkin lymphoma, unspecified site: Secondary | ICD-10-CM | POA: Diagnosis not present

## 2018-01-27 DIAGNOSIS — E611 Iron deficiency: Secondary | ICD-10-CM

## 2018-01-27 MED ORDER — SODIUM CHLORIDE 0.9 % IV SOLN
INTRAVENOUS | Status: DC
  Administered 2018-01-27 (×2): via INTRAVENOUS

## 2018-01-27 MED ORDER — SODIUM CHLORIDE 0.9% FLUSH
10.0000 mL | INTRAVENOUS | Status: DC | PRN
  Administered 2018-01-27: 10 mL via INTRAVENOUS
  Filled 2018-01-27: qty 10

## 2018-01-27 MED ORDER — HEPARIN SOD (PORK) LOCK FLUSH 100 UNIT/ML IV SOLN
500.0000 [IU] | Freq: Once | INTRAVENOUS | Status: AC
  Administered 2018-01-27: 500 [IU] via INTRAVENOUS

## 2018-01-27 MED ORDER — SODIUM CHLORIDE 0.9 % IV SOLN
750.0000 mg | Freq: Once | INTRAVENOUS | Status: AC
  Administered 2018-01-27: 750 mg via INTRAVENOUS
  Filled 2018-01-27: qty 15

## 2018-01-27 NOTE — Patient Instructions (Signed)
[facility] at [facility] Discharge Instructions  Received Injectafer infusion today. Follow-up as scheduled. Call clinic for any questions or concerns   Thank you for choosing [facility] at [facility] to provide your oncology and hematology care.  To afford each patient quality time with our provider, please arrive at least 15 minutes before your scheduled appointment time.   If you have a lab appointment with the Cancer Center please come in thru the  Main Entrance and check in at the main information desk  You need to re-schedule your appointment should you arrive 10 or more minutes late.  We strive to give you quality time with our providers, and arriving late affects you and other patients whose appointments are after yours.  Also, if you no show three or more times for appointments you may be dismissed from the clinic at the providers discretion.     Again, thank you for choosing [facility].  Our hope is that these requests will decrease the amount of time that you wait before being seen by our physicians.       _____________________________________________________________  Should you have questions after your visit to [facility], please contact our office at [phone] between the hours of 8:30 a.m. and 4:30 p.m.  Voicemails left after 4:30 p.m. will not be returned until the following business day.  For prescription refill requests, have your pharmacy contact our office.       Resources For Cancer Patients and their Caregivers ? American Cancer Society: Can assist with transportation, wigs, general needs, runs Look Good Feel Better.        1-888-227-6333 ? Cancer Care: Provides financial assistance, online support groups, medication/co-pay assistance.  1-800-813-HOPE (4673) ? [facility] Assists [place] cancer patients and their families through emotional , educational and  financial support.  336-427-4357 ? [place] DSS Where to apply for food stamps, Medicaid and utility assistance. 336-342-1394 ? [facility]: Transportation to medical appointments. 336-347-2287 ? Social Security Administration: May apply for disability if have a Stage IV cancer. 336-342-7796 1-800-772-1213 ? [place] Aging, Disability and Transit Services: Assists with nutrition, care and transit needs. 336-349-2343  Cancer Center Support Programs:   > Cancer Support Group  2nd Tuesday of the month 1pm-2pm, Journey Room   > Creative Journey  3rd Tuesday of the month 1130am-1pm, Journey Room    

## 2018-01-27 NOTE — Progress Notes (Signed)
[name] tolerated Injectafer infusion well without complaints or incident. VSS upon discharge. Pt discharged self ambulatory in satisfactory condition

## 2018-03-08 ENCOUNTER — Inpatient Hospital Stay (HOSPITAL_COMMUNITY): Payer: 59 | Attending: Hematology

## 2018-03-08 ENCOUNTER — Encounter (HOSPITAL_COMMUNITY)
Admission: RE | Admit: 2018-03-08 | Discharge: 2018-03-08 | Disposition: A | Discharge status: Home / Self Care | Payer: 59 | Source: Ambulatory Visit | Attending: Internal Medicine | Admitting: Internal Medicine

## 2018-03-08 DIAGNOSIS — C8100 Nodular lymphocyte predominant Hodgkin lymphoma, unspecified site: Secondary | ICD-10-CM

## 2018-03-08 DIAGNOSIS — D509 Iron deficiency anemia, unspecified: Secondary | ICD-10-CM | POA: Insufficient documentation

## 2018-03-08 LAB — COMPREHENSIVE METABOLIC PANEL WITH GFR
ALT: 15 U/L (ref 0–44)
AST: 14 U/L — ABNORMAL LOW (ref 15–41)
Albumin: 3.8 g/dL (ref 3.5–5.0)
Alkaline Phosphatase: 37 U/L — ABNORMAL LOW (ref 38–126)
Anion gap: 7 (ref 5–15)
BUN: 14 mg/dL (ref 6–20)
CO2: 27 mmol/L (ref 22–32)
Calcium: 9.3 mg/dL (ref 8.9–10.3)
Chloride: 102 mmol/L (ref 98–111)
Creatinine, Ser: 0.73 mg/dL (ref 0.44–1.00)
GFR calc Af Amer: >60 mL/min
GFR calc non Af Amer: >60 mL/min
Glucose, Bld: 83 mg/dL (ref 70–99)
Potassium: 3.6 mmol/L (ref 3.5–5.1)
Sodium: 136 mmol/L (ref 135–145)
Total Bilirubin: 0.6 mg/dL (ref 0.3–1.2)
Total Protein: 7.6 g/dL (ref 6.5–8.1)

## 2018-03-08 LAB — CBC WITH DIFFERENTIAL/PLATELET
Basophils Absolute: 0 10*3/uL (ref 0.0–0.1)
Basophils Relative: 0 %
Eosinophils Absolute: 0.3 10*3/uL (ref 0.0–0.7)
Eosinophils Relative: 3 %
HCT: 42.2 % (ref 36.0–46.0)
Hemoglobin: 13.7 g/dL (ref 12.0–15.0)
Lymphocytes Relative: 26 %
Lymphs Abs: 2.1 10*3/uL (ref 0.7–4.0)
MCH: 26.1 pg (ref 26.0–34.0)
MCHC: 32.5 g/dL (ref 30.0–36.0)
MCV: 80.5 fL (ref 78.0–100.0)
Monocytes Absolute: 0.6 10*3/uL (ref 0.1–1.0)
Monocytes Relative: 7 %
Neutro Abs: 5.3 10*3/uL (ref 1.7–7.7)
Neutrophils Relative %: 64 %
Platelets: 416 10*3/uL — ABNORMAL HIGH (ref 150–400)
RBC: 5.24 MIL/uL — ABNORMAL HIGH (ref 3.87–5.11)
RDW: 18.7 % — ABNORMAL HIGH (ref 11.5–15.5)
WBC: 8.3 10*3/uL (ref 4.0–10.5)

## 2018-03-08 LAB — FERRITIN: Ferritin: 124 ng/mL (ref 11–307)

## 2018-03-08 LAB — LACTATE DEHYDROGENASE: LDH: 95 U/L — AB (ref 98–192)

## 2018-03-08 LAB — SEDIMENTATION RATE: Sed Rate: 6 mm/h (ref 0–22)

## 2018-03-08 MED ORDER — FLUDEOXYGLUCOSE F - 18 (FDG) INJECTION
14.1200 | Freq: Once | INTRAVENOUS | Status: AC | PRN
  Administered 2018-03-08: 14.12 via INTRAVENOUS

## 2018-03-09 LAB — BETA 2 MICROGLOBULIN, SERUM: BETA 2 MICROGLOBULIN: 2 mg/L (ref 0.6–2.4)

## 2018-03-09 LAB — PROTEIN ELECTROPHORESIS, SERUM
A/G Ratio: 1 (ref 0.7–1.7)
Albumin ELP: 3.4 g/dL (ref 2.9–4.4)
Alpha-1-Globulin: 0.3 g/dL (ref 0.0–0.4)
Alpha-2-Globulin: 0.6 g/dL (ref 0.4–1.0)
Beta Globulin: 1.3 g/dL (ref 0.7–1.3)
GLOBULIN, TOTAL: 3.5 g/dL (ref 2.2–3.9)
Gamma Globulin: 1.3 g/dL (ref 0.4–1.8)
TOTAL PROTEIN ELP: 6.9 g/dL (ref 6.0–8.5)

## 2018-03-10 ENCOUNTER — Inpatient Hospital Stay (HOSPITAL_COMMUNITY): Payer: 59 | Admitting: Hematology

## 2018-03-12 ENCOUNTER — Encounter (HOSPITAL_COMMUNITY): Payer: Self-pay | Admitting: Hematology

## 2018-03-12 ENCOUNTER — Inpatient Hospital Stay (HOSPITAL_COMMUNITY): Payer: 59 | Attending: Internal Medicine | Admitting: Hematology

## 2018-03-12 VITALS — HR 88 | Temp 97.8°F | Resp 18 | Wt 274.8 lb

## 2018-03-12 DIAGNOSIS — E611 Iron deficiency: Secondary | ICD-10-CM

## 2018-03-12 DIAGNOSIS — C8100 Nodular lymphocyte predominant Hodgkin lymphoma, unspecified site: Secondary | ICD-10-CM | POA: Diagnosis not present

## 2018-03-12 NOTE — Assessment & Plan Note (Addendum)
1.  Nodular lymphocyte predominant Hodgkin's lymphoma: - Presentation with right inguinal lymphadenopathy, excisional biopsy on 04/07/2016, positive for night sweats - PET/CT scan showed hypermetabolic enlarged bilateral axillary, bilateral external iliac, bilateral inguinal lymph nodes on 04/25/2016 -6 cycles of R-CHOP from 05/05/2016 through 08/19/2016 - She had some residual adenopathy at the end of chemotherapy which have appeared to have slightly increased on subsequent scans.  It was unclear if this was reactive adenopathy from her hidradenitis versus recurrent lymphoma.  Her last PET CT scan was done in August 2018 showed focal uptake in the left inguinal skin surface, likely related to inflammation. - I have reviewed the results of the PET CT scan which showed hypermetabolic right axillary lymph node measuring 1.8 cm, previously 1.2 cm with SUV of 11, previously 6.2.  No abdominal adenopathy or organ involvement.  Hypermetabolic left groin and external iliac lymph nodes were more or less stable. -Patient does give history of recent flareup of hidradenitis in her right axillary skin area prior to the PET CT scan.  Hence I think it is most likely reactive.  She is feeling well without any problems.  She has a left hip pain for which she is seeing a bone specialist.  I did not see any bone lesions in that area except there is a diffuse uptake in the marrow space like other previous scans.  She also had skin lesions in the area of PET CT scan uptake.  Her blood work was within normal limits. -I have set up for follow-up in 4 months with repeat PET scan and blood work.

## 2018-03-12 NOTE — Patient Instructions (Signed)
[place] at [facility]  Discharge Instructions:  You were seen by dr. Raliegh Ip _______________________________________________________________  Thank you for choosing [facility] at [facility] to provide your oncology and hematology care.  To afford each patient quality time with our providers, please arrive at least 15 minutes before your scheduled appointment.  You need to re-schedule your appointment if you arrive 10 or more minutes late.  We strive to give you quality time with our providers, and arriving late affects you and other patients whose appointments are after yours.  Also, if you no show three or more times for appointments you may be dismissed from the clinic.  Again, thank you for choosing [facility] [place] hope is that these requests will allow you access to exceptional care and in a timely manner. _______________________________________________________________  If you have questions after your visit, please contact our office at [phone] between the hours of 8:30 a.m. and 5:00 p.m. Voicemails left after 4:30 p.m. will not be returned until the following business day. _______________________________________________________________  For prescription refill requests, have your pharmacy contact our office. _______________________________________________________________  Recommendations made by the consultant and any test results will be sent to your referring physician. _______________________________________________________________

## 2018-03-12 NOTE — Progress Notes (Signed)
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  CURRENT THERAPY: Observation.  BRIEF ONCOLOGIC HISTORY:    Hodgkin lymphoma, nodular lymphocyte predominance ([place])   04/07/2016 Procedure    Excisional lymph node biopsy of right thigh      04/11/2016 Pathology Results    Nodular lymphocyte predominant hodgkin Lymphoma      04/23/2016 Miscellaneous    Normal ESR, LDH      04/25/2016 PET scan    1. Hypermetabolic enlarged bilateral axillary, bilateral external iliac and bilateral inguinal lymph nodes consistent with lymphoma. 2. Hypermetabolic foci of skin thickening in the bilateral axilla and bilateral lower ventral chest wall, nonspecific, cannot exclude cutaneous lymphoma. 3. Hypermetabolism throughout Waldeyer's ring with associated mucosal thickening on the CT images, which could be inflammatory or due to lymphoma. 4. No splenic enlargement or hypermetabolism. 5. Diffuse hypermetabolism throughout the axial skeleton without discrete bone lesions on the CT images, worrisome for diffuse osseous involvement by lymphoma.      04/29/2016 Imaging    MUGA- Left ventricular ejection fraction equals 66 %.      05/05/2016 - 08/19/2016 Chemotherapy    The patient had DOXOrubicin (ADRIAMYCIN) chemo injection 116 mg, 50 mg/m2 = 116 mg, Intravenous,  Once, 3 of 6 cycles  palonosetron (ALOXI) injection 0.25 mg, 0.25 mg, Intravenous,  Once, 3 of 6 cycles  pegfilgrastim (NEULASTA ONPRO KIT) injection 6 mg, 6 mg, Subcutaneous, Once, 3 of 6 cycles  vinCRIStine (ONCOVIN) 2 mg in sodium chloride 0.9 % 50 mL chemo infusion, 2 mg, Intravenous,  Once, 3 of 6 cycles Dose modification: 1 mg (original dose 2 mg, Cycle 3, Reason: Dose not tolerated, Comment: neuropathy)  riTUXimab (RITUXAN) 900 mg  in sodium chloride 0.9 % 250 mL (2.6471 mg/mL) chemo infusion, 375 mg/m2 = 900 mg, Intravenous,  Once, 3 of 6 cycles  cyclophosphamide (CYTOXAN) 1,740 mg in sodium chloride 0.9 % 250 mL chemo infusion, 750 mg/m2 = 1,740 mg, Intravenous,  Once, 3 of 6 cycles  for chemotherapy treatment.        07/02/2016 Procedure    Flexible cystoscopy by Dr. Alyson Ingles (Urology) discovering a 1 cm lesion with clot attached on the posterior wall of bladder.      07/04/2016 PET scan    1. Interval improvement in hypermetabolic nodal activity within the axilla, pelvis and inguinal regions bilaterally consistent with positive response to therapy. Deauville stage III. 2. Interval improvement in the previous demonstrated multifocal subcutaneous activity. There is residual prominent activity in the anterior left perineal region which could be inflammatory.  There is no focal hypermetabolic activity to suggest osseous metastatic disease. Mildly prominent marrow activity is again noted throughout the axial and proximal appendicular skeleton.      09/08/2016 PET scan    No significant change in mild hypermetabolic lymphadenopathy in bilateral axillary, external iliac, and inguinal chains (Deauville score 3). No new or increased hypermetabolic lymphadenopathy identified .  Increased multifocal hypermetabolic subcutaneous foci within the chest, abdomen and pelvis. This is of uncertain etiology and clinical significance .      01/06/2017 PET scan    IMPRESSION: 1. Scattered mild adenopathy in the neck, chest, and pelvis with increased metabolic activity, along with newly hypermetabolic cutaneous lesions, primarily Deauville 4 and Deauville 5 activity as noted above. 2. Geographic  hepatic steatosis.        INTERVAL HISTORY:  Ms. Brickner 32 y.o. female returns for follow-up of her Hodgkin's lymphoma.  She has a history of hidradenitis.  Presentation is with right inguinal and bilateral iliac lymph  nodes.  Excisional biopsy on 04/07/2016 shows nodular lymphocyte predominant Hodgkin's lymphoma.  She underwent 6 cycles of R-CHOP from 05/05/2016 through 08/19/2016.  She had some residual adenopathy at the end of chemotherapy which appeared to have increased and subsequent scans.  It was unclear if this represented reactive adenopathy from her hidradenitis versus recurrent lymphoma.  She was evaluated at [facility] and was thought that the lymph nodes were reactive.  Excisional biopsy was not recommended at that time.  She was last evaluated at [facility] on 12/14/2017.  Last PET CT scan at [facility] was in August 2018 which showed focal uptake along the left inguinal skin surface, likely related to inflammation.  Hypermetabolic uptake with a 1 x 2.5 cm right external iliac lymph node SUV 1.5.  Uptake within bilateral inguinal lymph nodes is below liver intensity.  Right axillary adenopathy shows uptake similar to or slightly greater than liver.    REVIEW OF SYSTEMS:  Review of Systems - Oncology   PAST MEDICAL/SURGICAL HISTORY:  Past Medical History:  Diagnosis Date  . Anxiety   . Asthma    had since childhood; has not had to use inhaler or nebulizer for 8-9 years. Been doing well.  Marland Kitchen Dyspnea   . Headache(784.0)    usually controlled c Tylenol when not pregnant, but more painful headaches  during preg. due to hypertension.  . Hidradenitis suppurativa   . Hodgkin lymphoma, nodular lymphocyte predominance ([place]) 04/15/2016  . Hypertension December 2011   Been on Aldomet since Dec. 2011  . Iron deficiency 01/13/2017  . Lymphoma, Hodgkin's ([place])    Stage IV  . Sleep apnea    Past Surgical History:  Procedure Laterality Date  . ABDOMINAL HYSTERECTOMY  05/2017  . ADENOIDECTOMY     age 49  . BREAST RECONSTRUCTION  December 2001   breast reduction at age 76  . FULGURATION OF BLADDER TUMOR N/A 07/23/2016   Procedure: CYSTOSCOPY, BIOPSY AND FULGURATION OF BLADDER;  Surgeon:  Cleon Gustin, MD;  Location: [facility];  Service: Urology;  Laterality: N/A;  . MASS EXCISION Right 04/07/2016   Procedure: EXCISION/BIOPSY SOFT TISSUE MASS RIGHT THIGH;  Surgeon: Aviva Signs, MD;  Location: [facility];  Service: General;  Laterality: Right;  . port a cath placed    . PORTACATH PLACEMENT Left 04/28/2016   Procedure: INSERTION PORT-A-CATH;  Surgeon: Aviva Signs, MD;  Location: [facility];  Service: General;  Laterality: Left;     SOCIAL HISTORY:  Social History   Socioeconomic History  . Marital status: Married    Spouse name: Not on file  . Number of children: Not on file  . Years of education: Not on file  . Highest education level: Not on file  Occupational History  . Not on file  Social Needs  . Financial resource strain: Not on file  . Food insecurity:    Worry: Not on file    Inability: Not on file  . Transportation needs:    Medical: Not on file    Non-medical: Not on file  Tobacco Use  . Smoking status: Never Smoker  . Smokeless tobacco: Never Used  Substance and Sexual Activity  . Alcohol use: Yes    Alcohol/week: 0.6 oz  Types: 1 Glasses of wine per week  . Drug use: No  . Sexual activity: Yes    Birth control/protection: IUD  Lifestyle  . Physical activity:    Days per week: Not on file    Minutes per session: Not on file  . Stress: Not on file  Relationships  . Social connections:    Talks on phone: Not on file    Gets together: Not on file    Attends religious service: Not on file    Active member of club or organization: Not on file    Attends meetings of clubs or organizations: Not on file    Relationship status: Not on file  . Intimate partner violence:    Fear of current or ex partner: Not on file    Emotionally abused: Not on file    Physically abused: Not on file    Forced sexual activity: Not on file  Other Topics Concern  . Not on file  Social History Narrative  . Not on file    FAMILY HISTORY:  Family History  Problem  Relation Age of Onset  . Hypertension Mother   . Cancer Mother   . Hypertension Father   . Heart disease Maternal Grandmother     CURRENT MEDICATIONS:  Outpatient Encounter Medications as of 03/12/2018  Medication Sig  . amLODipine (NORVASC) 5 MG tablet Take 1 tablet (5 mg total) by mouth daily.  . citalopram (CELEXA) 20 MG tablet Take 20 mg by mouth at bedtime.  . ondansetron (ZOFRAN) 8 MG tablet TAKE 1 TABLET BY MOUTH 2 TIMES DAILY AS NEEDED FOR REFRACTORY NAUSEA/VOMITING. START ON DAY 3 AFTER CYCLOPHOSAMIDE.  Marland Kitchen potassium chloride SA (K-DUR,KLOR-CON) 20 MEQ tablet Take 1 tablet (20 mEq total) by mouth daily.  . traMADol (ULTRAM) 50 MG tablet Take one every 6-8 hours for headache not helped by motrin  . Vitamin D, Ergocalciferol, (DRISDOL) 50000 units CAPS capsule Take 50,000 Units by mouth every Monday. Takes on Mondays.  . [DISCONTINUED] allopurinol (ZYLOPRIM) 300 MG tablet Take 1 tablet (300 mg total) by mouth daily.  . [DISCONTINUED] prochlorperazine (COMPAZINE) 10 MG tablet Take 1 tablet (10 mg total) by mouth every 6 (six) hours as needed (Nausea or vomiting).   No facility-administered encounter medications on file as of 03/12/2018.     ALLERGIES:  No Known Allergies   PHYSICAL EXAM:  ECOG Performance status: 1  Vitals:   03/12/18 1019  Pulse: 88  Resp: 18  Temp: 97.8 F (36.6 C)  SpO2: 100%   Filed Weights   03/12/18 1019  Weight: 274 lb 12.8 oz (124.6 kg)    Physical Exam HEENT: No clearly palpable lymph nodes in the neck region.  Oropharynx has no lesions. Chest: Bilaterally clear to auscultation. CVS: S1-S2 regular rate and rhythm. Skin: There is healing hidradenitis under the right axillary region.  There are also skin lesions in the area of uptake on the PET scan.  LABORATORY DATA:  I have reviewed the labs as listed.  CBC    Component Value Date/Time   WBC 8.3 03/08/2018 1543   RBC 5.24 (H) 03/08/2018 1543   HGB 13.7 03/08/2018 1543   HCT 42.2  03/08/2018 1543   PLT 416 (H) 03/08/2018 1543   MCV 80.5 03/08/2018 1543   MCH 26.1 03/08/2018 1543   MCHC 32.5 03/08/2018 1543   RDW 18.7 (H) 03/08/2018 1543   LYMPHSABS 2.1 03/08/2018 1543   MONOABS 0.6 03/08/2018 1543   EOSABS 0.3 03/08/2018 1543  BASOSABS 0.0 03/08/2018 1543   CMP Latest Ref Rng & Units 03/08/2018 01/24/2018 01/09/2017  Glucose 70 - 99 mg/dL 83 92 86  BUN 6 - 20 mg/dL _0 Creatinine 0.44 - 1.00 mg/dL 0.73 0.83 0.73  Sodium 135 - 145 mmol/L 136 142 135  Potassium 3.5 - 5.1 mmol/L 3.6 3.7 3.4(L)  Chloride 98 - 111 mmol/L 102 106 102  CO2 22 - 32 mmol/L _1 Calcium 8.9 - 10.3 mg/dL 9.3 9.7 9.2  Total Protein 6.5 - 8.1 g/dL 7.6 7.0 7.3  Total Bilirubin 0.3 - 1.2 mg/dL 0.6 0.2(L) 0.4  Alkaline Phos 38 - 126 U/L 37(L) 43 52  AST 15 - 41 U/L 14(L) 23 19  ALT 0 - 44 U/L 15 9(L) 20       DIAGNOSTIC IMAGING:  I have reviewed the images of the PET CT scan with the patient.     ASSESSMENT & PLAN:   Hodgkin lymphoma, nodular lymphocyte predominance ([place]) 1.  Nodular lymphocyte predominant Hodgkin's lymphoma: - Presentation with right inguinal lymphadenopathy, excisional biopsy on 04/07/2016, positive for night sweats - PET/CT scan showed hypermetabolic enlarged bilateral axillary, bilateral external iliac, bilateral inguinal lymph nodes on 04/25/2016 -6 cycles of R-CHOP from 05/05/2016 through 08/19/2016 - She had some residual adenopathy at the end of chemotherapy which have appeared to have slightly increased on subsequent scans.  It was unclear if this was reactive adenopathy from her hidradenitis versus recurrent lymphoma.  Her last PET CT scan was done in August 2018 showed focal uptake in the left inguinal skin surface, likely related to inflammation. - I have reviewed the results of the PET CT scan which showed hypermetabolic right axillary lymph node measuring 1.8 cm, previously 1.2 cm with SUV of 11, previously 6.2.  No abdominal adenopathy or organ  involvement.  Hypermetabolic left groin and external iliac lymph nodes were more or less stable. -Patient does give history of recent flareup of hidradenitis in her right axillary skin area prior to the PET CT scan.  Hence I think it is most likely reactive.  She is feeling well without any problems.  She has a left hip pain for which she is seeing a bone specialist.  I did not see any bone lesions in that area except there is a diffuse uptake in the marrow space like other previous scans.  Her blood work was within normal limits. -I have set up for follow-up in 4 months with repeat PET scan and blood work.      Orders placed this encounter:  Orders Placed This Encounter  Procedures  . NM PET Image Restag (PS) Skull Base To Thigh  . Comprehensive metabolic panel  . CBC with Differential  . Lactate dehydrogenase  . Ferritin  . Iron and TIBC      Derek Jack, MD [place] [phone]

## 2018-03-25 ENCOUNTER — Inpatient Hospital Stay (HOSPITAL_COMMUNITY): Payer: 59 | Attending: Internal Medicine

## 2018-04-02 ENCOUNTER — Other Ambulatory Visit: Payer: Self-pay | Admitting: Neurology

## 2018-04-02 ENCOUNTER — Other Ambulatory Visit (HOSPITAL_COMMUNITY): Payer: Self-pay | Admitting: Neurology

## 2018-04-02 DIAGNOSIS — G35 Multiple sclerosis: Secondary | ICD-10-CM

## 2018-04-08 ENCOUNTER — Ambulatory Visit (HOSPITAL_COMMUNITY): Payer: 59

## 2018-04-14 ENCOUNTER — Ambulatory Visit (HOSPITAL_COMMUNITY): Payer: 59

## 2018-04-26 ENCOUNTER — Ambulatory Visit (HOSPITAL_COMMUNITY)
Admission: RE | Admit: 2018-04-26 | Discharge: 2018-04-26 | Disposition: A | Discharge status: Home / Self Care | Payer: 59 | Source: Ambulatory Visit | Attending: Neurology | Admitting: Neurology

## 2018-04-26 DIAGNOSIS — G35 Multiple sclerosis: Secondary | ICD-10-CM | POA: Diagnosis present

## 2018-07-12 ENCOUNTER — Other Ambulatory Visit (HOSPITAL_COMMUNITY): Payer: Self-pay | Admitting: Nurse Practitioner

## 2018-07-12 ENCOUNTER — Other Ambulatory Visit (HOSPITAL_COMMUNITY): Payer: 59

## 2018-07-12 ENCOUNTER — Ambulatory Visit (HOSPITAL_COMMUNITY): Payer: 59

## 2018-07-12 DIAGNOSIS — C8100 Nodular lymphocyte predominant Hodgkin lymphoma, unspecified site: Secondary | ICD-10-CM

## 2018-07-14 ENCOUNTER — Ambulatory Visit (HOSPITAL_COMMUNITY): Payer: 59 | Admitting: Hematology

## 2018-07-23 ENCOUNTER — Inpatient Hospital Stay (HOSPITAL_COMMUNITY): Payer: 59 | Attending: Hematology

## 2018-07-23 DIAGNOSIS — Z90710 Acquired absence of both cervix and uterus: Secondary | ICD-10-CM | POA: Diagnosis not present

## 2018-07-23 DIAGNOSIS — C8105 Nodular lymphocyte predominant Hodgkin lymphoma, lymph nodes of inguinal region and lower limb: Secondary | ICD-10-CM | POA: Insufficient documentation

## 2018-07-23 DIAGNOSIS — Z79899 Other long term (current) drug therapy: Secondary | ICD-10-CM | POA: Insufficient documentation

## 2018-07-23 DIAGNOSIS — Z9221 Personal history of antineoplastic chemotherapy: Secondary | ICD-10-CM | POA: Diagnosis not present

## 2018-07-23 DIAGNOSIS — I10 Essential (primary) hypertension: Secondary | ICD-10-CM | POA: Insufficient documentation

## 2018-07-23 DIAGNOSIS — C8100 Nodular lymphocyte predominant Hodgkin lymphoma, unspecified site: Secondary | ICD-10-CM

## 2018-07-23 DIAGNOSIS — E611 Iron deficiency: Secondary | ICD-10-CM

## 2018-07-23 LAB — CBC WITH DIFFERENTIAL/PLATELET
Abs Immature Granulocytes: 0.03 10*3/uL (ref 0.00–0.07)
BASOS ABS: 0 10*3/uL (ref 0.0–0.1)
Basophils Relative: 0 %
EOS ABS: 0.3 10*3/uL (ref 0.0–0.5)
EOS PCT: 3 %
HEMATOCRIT: 43.9 % (ref 36.0–46.0)
Hemoglobin: 13.6 g/dL (ref 12.0–15.0)
Immature Granulocytes: 0 %
LYMPHS ABS: 1.6 10*3/uL (ref 0.7–4.0)
Lymphocytes Relative: 20 %
MCH: 26.3 pg (ref 26.0–34.0)
MCHC: 31 g/dL (ref 30.0–36.0)
MCV: 84.7 fL (ref 80.0–100.0)
Monocytes Absolute: 0.6 10*3/uL (ref 0.1–1.0)
Monocytes Relative: 7 %
NRBC: 0 % (ref 0.0–0.2)
Neutro Abs: 5.7 10*3/uL (ref 1.7–7.7)
Neutrophils Relative %: 70 %
Platelets: 370 10*3/uL (ref 150–400)
RBC: 5.18 MIL/uL — AB (ref 3.87–5.11)
RDW: 13.9 % (ref 11.5–15.5)
WBC: 8.2 10*3/uL (ref 4.0–10.5)

## 2018-07-23 LAB — COMPREHENSIVE METABOLIC PANEL
ALK PHOS: 35 U/L — AB (ref 38–126)
ALT: 20 U/L (ref 0–44)
AST: 19 U/L (ref 15–41)
Albumin: 3.5 g/dL (ref 3.5–5.0)
Anion gap: 6 (ref 5–15)
BUN: 8 mg/dL (ref 6–20)
CALCIUM: 8.8 mg/dL — AB (ref 8.9–10.3)
CO2: 24 mmol/L (ref 22–32)
CREATININE: 0.72 mg/dL (ref 0.44–1.00)
Chloride: 106 mmol/L (ref 98–111)
GFR calc non Af Amer: >60 mL/min (ref >60)
Glucose, Bld: 104 mg/dL — ABNORMAL HIGH (ref 70–99)
Potassium: 3.7 mmol/L (ref 3.5–5.1)
Sodium: 136 mmol/L (ref 135–145)
Total Bilirubin: 0.3 mg/dL (ref 0.3–1.2)
Total Protein: 7 g/dL (ref 6.5–8.1)

## 2018-07-23 LAB — FERRITIN: Ferritin: 43 ng/mL (ref 11–307)

## 2018-07-23 LAB — LACTATE DEHYDROGENASE: LDH: 104 U/L (ref 98–192)

## 2018-07-23 LAB — IRON AND TIBC
IRON: 44 ug/dL (ref 28–170)
SATURATION RATIOS: 10 % — AB (ref 10.4–31.8)
TIBC: 437 ug/dL (ref 250–450)
UIBC: 393 ug/dL

## 2018-07-30 ENCOUNTER — Ambulatory Visit (HOSPITAL_COMMUNITY)
Admission: RE | Admit: 2018-07-30 | Discharge: 2018-07-30 | Disposition: A | Discharge status: Home / Self Care | Payer: 59 | Source: Ambulatory Visit | Attending: Nurse Practitioner | Admitting: Nurse Practitioner

## 2018-07-30 ENCOUNTER — Encounter (HOSPITAL_COMMUNITY): Payer: Self-pay

## 2018-07-30 DIAGNOSIS — C8100 Nodular lymphocyte predominant Hodgkin lymphoma, unspecified site: Secondary | ICD-10-CM | POA: Diagnosis not present

## 2018-07-30 MED ORDER — IOPAMIDOL (ISOVUE-300) INJECTION 61%
100.0000 mL | Freq: Once | INTRAVENOUS | Status: AC | PRN
  Administered 2018-07-30: 100 mL via INTRAVENOUS

## 2018-08-04 ENCOUNTER — Other Ambulatory Visit: Payer: Self-pay

## 2018-08-04 ENCOUNTER — Encounter (HOSPITAL_COMMUNITY): Payer: Self-pay | Admitting: Hematology

## 2018-08-04 ENCOUNTER — Inpatient Hospital Stay (HOSPITAL_BASED_OUTPATIENT_CLINIC_OR_DEPARTMENT_OTHER): Payer: 59 | Admitting: Hematology

## 2018-08-04 VITALS — BP 149/106 | HR 97 | Temp 97.9°F | Resp 18 | Wt 279.0 lb

## 2018-08-04 DIAGNOSIS — C8105 Nodular lymphocyte predominant Hodgkin lymphoma, lymph nodes of inguinal region and lower limb: Secondary | ICD-10-CM

## 2018-08-04 DIAGNOSIS — Z9221 Personal history of antineoplastic chemotherapy: Secondary | ICD-10-CM | POA: Diagnosis not present

## 2018-08-04 DIAGNOSIS — Z79899 Other long term (current) drug therapy: Secondary | ICD-10-CM

## 2018-08-04 DIAGNOSIS — Z90710 Acquired absence of both cervix and uterus: Secondary | ICD-10-CM | POA: Diagnosis not present

## 2018-08-04 DIAGNOSIS — I10 Essential (primary) hypertension: Secondary | ICD-10-CM

## 2018-08-04 DIAGNOSIS — C8100 Nodular lymphocyte predominant Hodgkin lymphoma, unspecified site: Secondary | ICD-10-CM

## 2018-08-04 NOTE — Patient Instructions (Signed)
[facility] Discharge Instructions     Thank you for choosing [facility] to provide your oncology and hematology care.  To afford each patient quality time with our provider, please arrive at least 15 minutes before your scheduled appointment time.   If you have a lab appointment with the Cancer Center please come in thru the  Main Entrance and check in at the main information desk  You need to re-schedule your appointment should you arrive 10 or more minutes late.  We strive to give you quality time with our providers, and arriving late affects you and other patients whose appointments are after yours.  Also, if you no show three or more times for appointments you may be dismissed from the clinic at the providers discretion.     Again, thank you for choosing [facility].  Our hope is that these requests will decrease the amount of time that you wait before being seen by our physicians.       _____________________________________________________________  Should you have questions after your visit to [facility], please contact our office at [phone] between the hours of 8:00 a.m. and 4:30 p.m.  Voicemails left after 4:00 p.m. will not be returned until the following business day.  For prescription refill requests, have your pharmacy contact our office and allow 72 hours.    Cancer Center Support Programs:   > Cancer Support Group  2nd Tuesday of the month 1pm-2pm, Journey Room    

## 2018-08-04 NOTE — Assessment & Plan Note (Signed)
1.  Nodular lymphocyte predominant Hodgkin's lymphoma: - Presentation with right inguinal lymphadenopathy, excisional biopsy on 04/07/2016, positive for night sweats - PET/CT scan showed hypermetabolic enlarged bilateral axillary, bilateral external iliac, bilateral inguinal lymph nodes on 04/25/2016 -6 cycles of R-CHOP from 05/05/2016 through 08/19/2016 - She had some residual adenopathy at the end of chemotherapy which have appeared to have slightly increased on subsequent scans.  It was unclear if this was reactive adenopathy from her hidradenitis versus recurrent lymphoma.  Her last PET CT scan was done in August 2018 showed focal uptake in the left inguinal skin surface, likely related to inflammation. - I have reviewed the results of the PET CT scan which showed hypermetabolic right axillary lymph node measuring 1.8 cm, previously 1.2 cm with SUV of 11, previously 6.2.  No abdominal adenopathy or organ involvement.  Hypermetabolic left groin and external iliac lymph nodes were more or less stable. - Hypermetabolism on the previous PET scan was thought to be secondary to hidradenitis. - She denies any fevers, night sweats or weight loss.  She denies any recurrent infections or hospitalizations. - We reviewed the results of the CT scan of the chest, abdomen and pelvis dated 07/30/2018.  This was compared to the PET scan from May 2018 instead of the July 2019 PET scan.  Right axillary lymph node has minimally increased in size from 18 mm to 21 mm.  Second right axillary lymph node has decreased from 1.5cm to 1.3 cm.  Left inguinal lymph node has increased from 76mm to 13.5 mm.  These are very minimal changes.  I have asked the radiologist to give a new report by comparing it to the latest PET CT scan.  As she is not having any symptoms, I have recommended further follow-up in 4 months with repeat CT scan.

## 2018-08-04 NOTE — Progress Notes (Signed)
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  CURRENT THERAPY: Observation  BRIEF ONCOLOGIC HISTORY:    Hodgkin lymphoma, nodular lymphocyte predominance ([place])   04/07/2016 Procedure    Excisional lymph node biopsy of right thigh    04/11/2016 Pathology Results    Nodular lymphocyte predominant hodgkin Lymphoma    04/23/2016 Miscellaneous    Normal ESR, LDH    04/25/2016 PET scan    1. Hypermetabolic enlarged bilateral axillary, bilateral external iliac and bilateral inguinal lymph nodes consistent with lymphoma. 2. Hypermetabolic foci of skin thickening in the bilateral axilla and bilateral lower ventral chest wall, nonspecific, cannot exclude cutaneous lymphoma. 3. Hypermetabolism throughout Waldeyer's ring with associated mucosal thickening on the CT images, which could be inflammatory or due to lymphoma. 4. No splenic enlargement or hypermetabolism. 5. Diffuse hypermetabolism throughout the axial skeleton without discrete bone lesions on the CT images, worrisome for diffuse osseous involvement by lymphoma.    04/29/2016 Imaging    MUGA- Left ventricular ejection fraction equals 66 %.    05/05/2016 - 08/19/2016 Chemotherapy    The patient had DOXOrubicin (ADRIAMYCIN) chemo injection 116 mg, 50 mg/m2 = 116 mg, Intravenous,  Once, 3 of 6 cycles  palonosetron (ALOXI) injection 0.25 mg, 0.25 mg, Intravenous,  Once, 3 of 6 cycles  pegfilgrastim (NEULASTA ONPRO KIT) injection 6 mg, 6 mg, Subcutaneous, Once, 3 of 6 cycles  vinCRIStine (ONCOVIN) 2 mg in sodium chloride 0.9 % 50 mL chemo infusion, 2 mg, Intravenous,  Once, 3 of 6 cycles Dose modification: 1 mg (original dose 2 mg, Cycle 3, Reason: Dose not tolerated, Comment: neuropathy)  riTUXimab (RITUXAN) 900 mg in sodium chloride  0.9 % 250 mL (2.6471 mg/mL) chemo infusion, 375 mg/m2 = 900 mg, Intravenous,  Once, 3 of 6 cycles  cyclophosphamide (CYTOXAN) 1,740 mg in sodium chloride 0.9 % 250 mL chemo infusion, 750 mg/m2 = 1,740 mg, Intravenous,  Once, 3 of 6 cycles  for chemotherapy treatment.      07/02/2016 Procedure    Flexible cystoscopy by Dr. Alyson Ingles (Urology) discovering a 1 cm lesion with clot attached on the posterior wall of bladder.    07/04/2016 PET scan    1. Interval improvement in hypermetabolic nodal activity within the axilla, pelvis and inguinal regions bilaterally consistent with positive response to therapy. Deauville stage III. 2. Interval improvement in the previous demonstrated multifocal subcutaneous activity. There is residual prominent activity in the anterior left perineal region which could be inflammatory.  There is no focal hypermetabolic activity to suggest osseous metastatic disease. Mildly prominent marrow activity is again noted throughout the axial and proximal appendicular skeleton.    09/08/2016 PET scan    No significant change in mild hypermetabolic lymphadenopathy in bilateral axillary, external iliac, and inguinal chains (Deauville score 3). No new or increased hypermetabolic lymphadenopathy identified .  Increased multifocal hypermetabolic subcutaneous foci within the chest, abdomen and pelvis. This is of uncertain etiology and clinical significance .    01/06/2017 PET scan    IMPRESSION: 1. Scattered mild adenopathy in the neck, chest, and pelvis with increased metabolic activity, along with newly hypermetabolic cutaneous lesions, primarily Deauville 4 and Deauville 5 activity as noted above. 2. Geographic hepatic steatosis.      INTERVAL HISTORY:  Ms. Boord 32 y.o. female returns for routine follow-up  for nodular lymphocyte predominant Hodgkin's lymphoma. She is here today and doing well. She has noticed one lymph node on the back of her neck and the  other in her left inguinal area. Otherwise she feels great and has no other complaints. She denies any new pains. Denies any nausea, vomiting, or diarrhea. Denies any bleeding or easy bruising. She reports her appetite at 100%. She has no problem maintaining her weight at this time.     REVIEW OF SYSTEMS:  Review of Systems  Psychiatric/Behavioral: Positive for sleep disturbance.  All other systems reviewed and are negative.    PAST MEDICAL/SURGICAL HISTORY:  Past Medical History:  Diagnosis Date  . Anxiety   . Asthma    had since childhood; has not had to use inhaler or nebulizer for 8-9 years. Been doing well.  Marland Kitchen Dyspnea   . Headache(784.0)    usually controlled c Tylenol when not pregnant, but more painful headaches  during preg. due to hypertension.  . Hidradenitis suppurativa   . Hodgkin lymphoma, nodular lymphocyte predominance ([place]) 04/15/2016  . Hypertension December 2011   Been on [place] since Dec. 2011  . Iron deficiency 01/13/2017  . Lymphoma, Hodgkin's ([place])    Stage IV  . Sleep apnea    Past Surgical History:  Procedure Laterality Date  . ABDOMINAL HYSTERECTOMY  05/2017  . ADENOIDECTOMY     age 12  . BREAST RECONSTRUCTION  December 2001   breast reduction at age 77  . FULGURATION OF BLADDER TUMOR N/A 07/23/2016   Procedure: CYSTOSCOPY, BIOPSY AND FULGURATION OF BLADDER;  Surgeon: Cleon Gustin, MD;  Location: [facility];  Service: Urology;  Laterality: N/A;  . MASS EXCISION Right 04/07/2016   Procedure: EXCISION/BIOPSY SOFT TISSUE MASS RIGHT THIGH;  Surgeon: Aviva Signs, MD;  Location: [facility];  Service: General;  Laterality: Right;  . port a cath placed    . PORTACATH PLACEMENT Left 04/28/2016   Procedure: INSERTION PORT-A-CATH;  Surgeon: Aviva Signs, MD;  Location: [facility];  Service: General;  Laterality: Left;     SOCIAL HISTORY:  Social History   Socioeconomic History  . Marital status: Married    Spouse name: Not on file  . Number of children: Not on  file  . Years of education: Not on file  . Highest education level: Not on file  Occupational History  . Not on file  Social Needs  . Financial resource strain: Not on file  . Food insecurity:    Worry: Not on file    Inability: Not on file  . Transportation needs:    Medical: Not on file    Non-medical: Not on file  Tobacco Use  . Smoking status: Never Smoker  . Smokeless tobacco: Never Used  Substance and Sexual Activity  . Alcohol use: Yes    Alcohol/week: 1.0 standard drinks    Types: 1 Glasses of wine per week  . Drug use: No  . Sexual activity: Yes    Birth control/protection: I.U.D.  Lifestyle  . Physical activity:    Days per week: Not on file    Minutes per session: Not on file  . Stress: Not on file  Relationships  . Social connections:    Talks on phone: Not on file    Gets together: Not on file    Attends religious service: Not on file    Active member of club or organization: Not on file    Attends meetings of clubs or organizations: Not on file  Relationship status: Not on file  . Intimate partner violence:    Fear of current or ex partner: Not on file    Emotionally abused: Not on file    Physically abused: Not on file    Forced sexual activity: Not on file  Other Topics Concern  . Not on file  Social History Narrative  . Not on file    FAMILY HISTORY:  Family History  Problem Relation Age of Onset  . Hypertension Mother   . Cancer Mother   . Hypertension Father   . Heart disease Maternal Grandmother     CURRENT MEDICATIONS:  Outpatient Encounter Medications as of 08/04/2018  Medication Sig  . amLODipine (NORVASC) 5 MG tablet Take 1 tablet (5 mg total) by mouth daily. (Patient taking differently: Take 10 mg by mouth daily. )  . citalopram (CELEXA) 20 MG tablet Take 20 mg by mouth at bedtime.  . ondansetron (ZOFRAN) 8 MG tablet TAKE 1 TABLET BY MOUTH 2 TIMES DAILY AS NEEDED FOR REFRACTORY NAUSEA/VOMITING. START ON DAY 3 AFTER  CYCLOPHOSAMIDE.  Marland Kitchen potassium chloride SA (K-DUR,KLOR-CON) 20 MEQ tablet Take 1 tablet (20 mEq total) by mouth daily.  . traMADol (ULTRAM) 50 MG tablet Take one every 6-8 hours for headache not helped by motrin  . Vitamin D, Ergocalciferol, (DRISDOL) 50000 units CAPS capsule Take 50,000 Units by mouth every Monday. Takes on Mondays.  . [DISCONTINUED] allopurinol (ZYLOPRIM) 300 MG tablet Take 1 tablet (300 mg total) by mouth daily.  . [DISCONTINUED] prochlorperazine (COMPAZINE) 10 MG tablet Take 1 tablet (10 mg total) by mouth every 6 (six) hours as needed (Nausea or vomiting).   No facility-administered encounter medications on file as of 08/04/2018.     ALLERGIES:  No Known Allergies   PHYSICAL EXAM:  ECOG Performance status: 1  Vitals:   08/04/18 0847  BP: (!) 149/106  Pulse: 97  Resp: 18  Temp: 97.9 F (36.6 C)  SpO2: 100%   Filed Weights   08/04/18 0847  Weight: 279 lb (126.6 kg)    Physical Exam Constitutional:      Appearance: Normal appearance. She is normal weight.  HENT:     Head: Normocephalic.  Neck:     Musculoskeletal: Normal range of motion.  Cardiovascular:     Rate and Rhythm: Normal rate and regular rhythm.     Heart sounds: Normal heart sounds.  Pulmonary:     Effort: Pulmonary effort is normal.     Breath sounds: Normal breath sounds.  Musculoskeletal: Normal range of motion.  Lymphadenopathy:     Cervical: Cervical adenopathy present.  Skin:    General: Skin is warm and dry.  Neurological:     Mental Status: She is alert and oriented to person, place, and time. Mental status is at baseline.  Psychiatric:        Mood and Affect: Mood normal.        Behavior: Behavior normal.        Thought Content: Thought content normal.        Judgment: Judgment normal.      LABORATORY DATA:  I have reviewed the labs as listed.  CBC    Component Value Date/Time   WBC 8.2 07/23/2018 0923   RBC 5.18 (H) 07/23/2018 0923   HGB 13.6 07/23/2018 0923    HCT 43.9 07/23/2018 0923   PLT 370 07/23/2018 0923   MCV 84.7 07/23/2018 0923   MCH 26.3 07/23/2018 0923   MCHC 31.0 07/23/2018 0923  RDW 13.9 07/23/2018 0923   LYMPHSABS 1.6 07/23/2018 0923   MONOABS 0.6 07/23/2018 0923   EOSABS 0.3 07/23/2018 0923   BASOSABS 0.0 07/23/2018 0923   CMP Latest Ref Rng & Units 07/23/2018 03/08/2018 01/24/2018  Glucose 70 - 99 mg/dL 104(H) 83 92  BUN 6 - 20 mg/dL _0 Creatinine 0.44 - 1.00 mg/dL 0.72 0.73 0.83  Sodium 135 - 145 mmol/L 136 136 142  Potassium 3.5 - 5.1 mmol/L 3.7 3.6 3.7  Chloride 98 - 111 mmol/L 106 102 106  CO2 22 - 32 mmol/L _1 Calcium 8.9 - 10.3 mg/dL 8.8(L) 9.3 9.7  Total Protein 6.5 - 8.1 g/dL 7.0 7.6 7.0  Total Bilirubin 0.3 - 1.2 mg/dL 0.3 0.6 0.2(L)  Alkaline Phos 38 - 126 U/L 35(L) 37(L) 43  AST 15 - 41 U/L 19 14(L) 23  ALT 0 - 44 U/L 20 15 9(L)       DIAGNOSTIC IMAGING:  I have independently reviewed the scans and discussed with the patient.   I have reviewed Francene Finders, NP's note and agree with the documentation.  I personally performed a face-to-face visit, made revisions and my assessment and plan is as follows.    ASSESSMENT & PLAN:   Hodgkin lymphoma, nodular lymphocyte predominance ([place]) 1.  Nodular lymphocyte predominant Hodgkin's lymphoma: - Presentation with right inguinal lymphadenopathy, excisional biopsy on 04/07/2016, positive for night sweats - PET/CT scan showed hypermetabolic enlarged bilateral axillary, bilateral external iliac, bilateral inguinal lymph nodes on 04/25/2016 -6 cycles of R-CHOP from 05/05/2016 through 08/19/2016 - She had some residual adenopathy at the end of chemotherapy which have appeared to have slightly increased on subsequent scans.  It was unclear if this was reactive adenopathy from her hidradenitis versus recurrent lymphoma.  Her last PET CT scan was done in August 2018 showed focal uptake in the left inguinal skin surface, likely related to inflammation. - I have  reviewed the results of the PET CT scan which showed hypermetabolic right axillary lymph node measuring 1.8 cm, previously 1.2 cm with SUV of 11, previously 6.2.  No abdominal adenopathy or organ involvement.  Hypermetabolic left groin and external iliac lymph nodes were more or less stable. - Hypermetabolism on the previous PET scan was thought to be secondary to hidradenitis. - She denies any fevers, night sweats or weight loss.  She denies any recurrent infections or hospitalizations. - We reviewed the results of the CT scan of the chest, abdomen and pelvis dated 07/30/2018.  This was compared to the PET scan from May 2018 instead of the July 2019 PET scan.  Right axillary lymph node has minimally increased in size from 18 mm to 21 mm.  Second right axillary lymph node has decreased from 1.5cm to 1.3 cm.  Left inguinal lymph node has increased from 40m to 13.5 mm.  These are very minimal changes.  I have asked the radiologist to give a new report by comparing it to the latest PET CT scan.  As she is not having any symptoms, I have recommended further follow-up in 4 months with repeat CT scan.       Orders placed this encounter:  Orders Placed This Encounter  Procedures  . CT Abdomen Pelvis W Contrast  . CT CHEST W CONTRAST  . CBC with Differential/Platelet  . Comprehensive metabolic panel  . Ferritin  . Iron and TIBC  . Lactate dehydrogenase  . Vitamin B12  . Folate  Derek Jack, MD [facility] [phone]

## 2018-09-17 ENCOUNTER — Emergency Department (HOSPITAL_COMMUNITY): Payer: 59

## 2018-09-17 ENCOUNTER — Other Ambulatory Visit: Payer: Self-pay

## 2018-09-17 ENCOUNTER — Observation Stay (HOSPITAL_COMMUNITY)
Admission: EM | Admit: 2018-09-17 | Discharge: 2018-09-19 | Disposition: A | Discharge status: Home / Self Care | Payer: 59 | Attending: Internal Medicine | Admitting: Internal Medicine

## 2018-09-17 ENCOUNTER — Encounter (HOSPITAL_COMMUNITY): Payer: Self-pay

## 2018-09-17 DIAGNOSIS — Z79899 Other long term (current) drug therapy: Secondary | ICD-10-CM | POA: Insufficient documentation

## 2018-09-17 DIAGNOSIS — I429 Cardiomyopathy, unspecified: Secondary | ICD-10-CM | POA: Insufficient documentation

## 2018-09-17 DIAGNOSIS — E876 Hypokalemia: Secondary | ICD-10-CM | POA: Diagnosis present

## 2018-09-17 DIAGNOSIS — E669 Obesity, unspecified: Secondary | ICD-10-CM | POA: Insufficient documentation

## 2018-09-17 DIAGNOSIS — I10 Essential (primary) hypertension: Secondary | ICD-10-CM | POA: Diagnosis not present

## 2018-09-17 DIAGNOSIS — R000 Tachycardia, unspecified: Secondary | ICD-10-CM

## 2018-09-17 DIAGNOSIS — R55 Syncope and collapse: Principal | ICD-10-CM

## 2018-09-17 DIAGNOSIS — J020 Streptococcal pharyngitis: Secondary | ICD-10-CM

## 2018-09-17 DIAGNOSIS — H6691 Otitis media, unspecified, right ear: Secondary | ICD-10-CM | POA: Insufficient documentation

## 2018-09-17 DIAGNOSIS — R42 Dizziness and giddiness: Secondary | ICD-10-CM | POA: Diagnosis present

## 2018-09-17 DIAGNOSIS — I428 Other cardiomyopathies: Secondary | ICD-10-CM

## 2018-09-17 DIAGNOSIS — E871 Hypo-osmolality and hyponatremia: Secondary | ICD-10-CM | POA: Diagnosis present

## 2018-09-17 DIAGNOSIS — E860 Dehydration: Secondary | ICD-10-CM

## 2018-09-17 DIAGNOSIS — J09X9 Influenza due to identified novel influenza A virus with other manifestations: Secondary | ICD-10-CM | POA: Insufficient documentation

## 2018-09-17 DIAGNOSIS — G4730 Sleep apnea, unspecified: Secondary | ICD-10-CM | POA: Diagnosis present

## 2018-09-17 DIAGNOSIS — Z8571 Personal history of Hodgkin lymphoma: Secondary | ICD-10-CM | POA: Diagnosis not present

## 2018-09-17 DIAGNOSIS — J45909 Unspecified asthma, uncomplicated: Secondary | ICD-10-CM | POA: Diagnosis not present

## 2018-09-17 DIAGNOSIS — E66813 Obesity, class 3: Secondary | ICD-10-CM

## 2018-09-17 DIAGNOSIS — H7491 Unspecified disorder of right middle ear and mastoid: Secondary | ICD-10-CM

## 2018-09-17 DIAGNOSIS — R590 Localized enlarged lymph nodes: Secondary | ICD-10-CM | POA: Diagnosis present

## 2018-09-17 DIAGNOSIS — J101 Influenza due to other identified influenza virus with other respiratory manifestations: Secondary | ICD-10-CM | POA: Diagnosis present

## 2018-09-17 DIAGNOSIS — H65191 Other acute nonsuppurative otitis media, right ear: Secondary | ICD-10-CM

## 2018-09-17 HISTORY — DX: Localized enlarged lymph nodes: R59.0

## 2018-09-17 LAB — D-DIMER, QUANTITATIVE: D-Dimer, Quant: 0.54 ug/mL-FEU — ABNORMAL HIGH (ref 0.00–0.50)

## 2018-09-17 LAB — COMPREHENSIVE METABOLIC PANEL
ALBUMIN: 3.7 g/dL (ref 3.5–5.0)
ALT: 15 U/L (ref 0–44)
AST: 18 U/L (ref 15–41)
Alkaline Phosphatase: 37 U/L — ABNORMAL LOW (ref 38–126)
Anion gap: 8 (ref 5–15)
BUN: 9 mg/dL (ref 6–20)
CO2: 27 mmol/L (ref 22–32)
CREATININE: 0.66 mg/dL (ref 0.44–1.00)
Calcium: 9.1 mg/dL (ref 8.9–10.3)
Chloride: 99 mmol/L (ref 98–111)
GFR calc Af Amer: >60 mL/min (ref >60)
GFR calc non Af Amer: >60 mL/min (ref >60)
GLUCOSE: 88 mg/dL (ref 70–99)
Potassium: 3.1 mmol/L — ABNORMAL LOW (ref 3.5–5.1)
Sodium: 134 mmol/L — ABNORMAL LOW (ref 135–145)
Total Bilirubin: 0.5 mg/dL (ref 0.3–1.2)
Total Protein: 7.6 g/dL (ref 6.5–8.1)

## 2018-09-17 LAB — CBC WITH DIFFERENTIAL/PLATELET
Abs Immature Granulocytes: 0.02 10*3/uL (ref 0.00–0.07)
Basophils Absolute: 0 10*3/uL (ref 0.0–0.1)
Basophils Relative: 1 %
Eosinophils Absolute: 0.2 10*3/uL (ref 0.0–0.5)
Eosinophils Relative: 2 %
HEMATOCRIT: 45.3 % (ref 36.0–46.0)
Hemoglobin: 14 g/dL (ref 12.0–15.0)
Immature Granulocytes: 0 %
LYMPHS ABS: 1.5 10*3/uL (ref 0.7–4.0)
Lymphocytes Relative: 24 %
MCH: 26.3 pg (ref 26.0–34.0)
MCHC: 30.9 g/dL (ref 30.0–36.0)
MCV: 85 fL (ref 80.0–100.0)
Monocytes Absolute: 0.9 10*3/uL (ref 0.1–1.0)
Monocytes Relative: 14 %
Neutro Abs: 3.7 10*3/uL (ref 1.7–7.7)
Neutrophils Relative %: 59 %
Platelets: 425 10*3/uL — ABNORMAL HIGH (ref 150–400)
RBC: 5.33 MIL/uL — ABNORMAL HIGH (ref 3.87–5.11)
RDW: 13.7 % (ref 11.5–15.5)
WBC: 6.3 10*3/uL (ref 4.0–10.5)
nRBC: 0 % (ref 0.0–0.2)

## 2018-09-17 LAB — URINALYSIS, ROUTINE W REFLEX MICROSCOPIC
Bilirubin Urine: NEGATIVE
Glucose, UA: NEGATIVE mg/dL
HGB URINE DIPSTICK: NEGATIVE
Ketones, ur: NEGATIVE mg/dL
Leukocytes, UA: NEGATIVE
Nitrite: NEGATIVE
Protein, ur: NEGATIVE mg/dL
Specific Gravity, Urine: 1.017 (ref 1.005–1.030)
pH: 6 (ref 5.0–8.0)

## 2018-09-17 LAB — PHOSPHORUS: Phosphorus: 3.3 mg/dL (ref 2.5–4.6)

## 2018-09-17 LAB — TROPONIN I: Troponin I: <0.03 ng/mL (ref <0.03)

## 2018-09-17 LAB — GROUP A STREP BY PCR: Group A Strep by PCR: DETECTED — AB

## 2018-09-17 LAB — MAGNESIUM: Magnesium: 2.1 mg/dL (ref 1.7–2.4)

## 2018-09-17 LAB — INFLUENZA PANEL BY PCR (TYPE A & B)
Influenza A By PCR: POSITIVE — AB
Influenza B By PCR: NEGATIVE

## 2018-09-17 MED ORDER — LACTATED RINGERS IV BOLUS
1000.0000 mL | Freq: Once | INTRAVENOUS | Status: AC
  Administered 2018-09-18: 1000 mL via INTRAVENOUS

## 2018-09-17 MED ORDER — ONDANSETRON HCL 4 MG/2ML IJ SOLN: 4.0000 mg | Freq: Four times a day (QID) | INTRAMUSCULAR | Status: DC | PRN

## 2018-09-17 MED ORDER — SODIUM CHLORIDE 0.9 % IV BOLUS
1000.0000 mL | Freq: Once | INTRAVENOUS | Status: AC
  Administered 2018-09-17: 1000 mL via INTRAVENOUS

## 2018-09-17 MED ORDER — SODIUM CHLORIDE 0.9% FLUSH
3.0000 mL | Freq: Two times a day (BID) | INTRAVENOUS | Status: DC
  Administered 2018-09-18 – 2018-09-19 (×2): 3 mL via INTRAVENOUS

## 2018-09-17 MED ORDER — HEPARIN SODIUM (PORCINE) 5000 UNIT/ML IJ SOLN
5000.0000 [IU] | Freq: Three times a day (TID) | INTRAMUSCULAR | Status: DC
  Administered 2018-09-17 – 2018-09-19 (×5): 5000 [IU] via SUBCUTANEOUS
  Filled 2018-09-17 (×6): qty 1

## 2018-09-17 MED ORDER — ONDANSETRON HCL 4 MG PO TABS: 4.0000 mg | ORAL_TABLET | Freq: Four times a day (QID) | ORAL | Status: DC | PRN

## 2018-09-17 MED ORDER — HEPARIN SODIUM (PORCINE) 5000 UNIT/ML IJ SOLN: 5000.0000 [IU] | Freq: Three times a day (TID) | INTRAMUSCULAR | Status: DC

## 2018-09-17 MED ORDER — IOPAMIDOL (ISOVUE-370) INJECTION 76%
100.0000 mL | Freq: Once | INTRAVENOUS | Status: AC | PRN
  Administered 2018-09-17: 100 mL via INTRAVENOUS

## 2018-09-17 MED ORDER — SODIUM CHLORIDE 0.9 % IV SOLN: INTRAVENOUS | Status: DC

## 2018-09-17 MED ORDER — POTASSIUM CHLORIDE CRYS ER 20 MEQ PO TBCR
40.0000 meq | EXTENDED_RELEASE_TABLET | Freq: Once | ORAL | Status: AC
  Administered 2018-09-17: 40 meq via ORAL
  Filled 2018-09-17: qty 2

## 2018-09-17 MED ORDER — SODIUM CHLORIDE 0.9 % IV SOLN
1.0000 g | Freq: Once | INTRAVENOUS | Status: DC
  Administered 2018-09-17: 1 g via INTRAVENOUS
  Filled 2018-09-17: qty 10

## 2018-09-17 MED ORDER — ACETAMINOPHEN 325 MG PO TABS
650.0000 mg | ORAL_TABLET | ORAL | Status: DC | PRN
  Administered 2018-09-18 – 2018-09-19 (×2): 650 mg via ORAL
  Filled 2018-09-17 (×2): qty 2

## 2018-09-17 MED ORDER — POTASSIUM CHLORIDE IN NACL 20-0.9 MEQ/L-% IV SOLN
INTRAVENOUS | Status: AC
  Filled 2018-09-17: qty 1000

## 2018-09-17 MED ORDER — POTASSIUM CHLORIDE IN NACL 20-0.9 MEQ/L-% IV SOLN
INTRAVENOUS | Status: AC
  Administered 2018-09-18 (×2): via INTRAVENOUS

## 2018-09-17 MED ORDER — MORPHINE SULFATE (PF) 2 MG/ML IV SOLN
2.0000 mg | INTRAVENOUS | Status: DC | PRN
  Administered 2018-09-17: 2 mg via INTRAVENOUS
  Filled 2018-09-17: qty 1

## 2018-09-17 MED ORDER — SODIUM CHLORIDE 0.9 % IV SOLN
INTRAVENOUS | Status: AC
  Filled 2018-09-17: qty 10

## 2018-09-17 MED ORDER — ACETAMINOPHEN 650 MG RE SUPP: 650.0000 mg | RECTAL | Status: DC | PRN

## 2018-09-17 MED ORDER — SODIUM CHLORIDE 0.9 % IV SOLN
2.0000 g | Freq: Once | INTRAVENOUS | Status: AC
  Administered 2018-09-17: 2 g via INTRAVENOUS

## 2018-09-17 NOTE — ED Triage Notes (Signed)
Pt was diagnosed with the flu at the beginning of this month. Took Tamiflu and is doing better. Pt states she passed out twice today. Feels like fluid is on her right ear. Hit her head on a chair when she passed out a second time. Friend says she went "unresponsive" twice on the way over here.

## 2018-09-17 NOTE — H&P (Signed)
History and Physical    [name] NFA:[id number] DOB: [birth year] DOA: 09/17/2018  PCP: Patient, No Pcp Per   Patient coming from: Home.  I have personally briefly reviewed patient's old medical records in [place]  Chief Complaint: "Passed out twice".  HPI: [name] is a 33 y.o. female with medical history significant of anxiety, depression, chronic cervical lymphadenopathy, asthma, history of headaches, hidradenitis, history of Hodgkin lymphoma, history of iron deficiency, sleep apnea who is coming to the emergency department due to losing consciousness twice earlier today.  She states that her first episode occurred while she was on the toilet urinating.  She felt lightheaded and the next thing she knew was that she was on the floor.  She may have passed out for a minute or 2.  She also passed out after getting up from her desk and walking a few steps.  She does not remember being lightheaded.  The next thing she remembers is her child touching her.  She denies fever, but complains of cough, which is occasionally productive, chills, rhinorrhea, fatigue and malaise.  She denies sore throat, hemoptysis, wheezing, chest pain, palpitations, diaphoresis, PND, orthopnea or pitting edema of the lower extremities.  No abdominal pain, nausea, emesis, diarrhea, constipation, melena or hematochezia.  No dysuria, frequency or hematuria.  ED Course: Initial vital signs temperature 99.1 F, pulse 124, respirations 12, blood pressure 151/108 mmHg and O2 sat 100% on room air.  She was given 2000 mL of NS bolus, K. Dur 40 mEq p.o. and 2 g of ceftriaxone IVPB.  Her urinalysis shows a hazy appearance, but is otherwise normal.  Group A strep by PCR was positive.  White count 6.3, hemoglobin 14.0 and platelets 425.  D-dimer 0.54 mcg/mL.  Troponin was normal.  CMP shows sodium 134 potassium 3.1 mmol/L.  Alkaline phosphatase is decreased at 37 units/L.  All other values are within normal  limits.  Imaging: Chest radiograph, CT head, CT cervical spine and CTA angiogram did not show any acute abnormalities.  Review of Systems: As per HPI otherwise 10 point review of systems negative.   Past Medical History:  Diagnosis Date  . Anxiety   . Asthma    had since childhood; has not had to use inhaler or nebulizer for 8-9 years. Been doing well.  . Cervical lymphadenopathy    left groin lymph node, axillary lymph nodes:  seen on PET scan 02/2018  . Dyspnea   . Headache(784.0)    usually controlled c Tylenol when not pregnant, but more painful headaches  during preg. due to hypertension.  . Hidradenitis suppurativa   . 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 Hypertension December 2011   Been on [place] since Dec. 2011  . Iron deficiency 01/13/2017  . 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 Sleep apnea     Past Surgical History:  Procedure Laterality Date  . ABDOMINAL HYSTERECTOMY  05/2017  . ADENOIDECTOMY     age 64  . BREAST RECONSTRUCTION  December 2001   breast reduction at age 61  . FULGURATION OF BLADDER TUMOR N/A 07/23/2016   Procedure: CYSTOSCOPY, BIOPSY AND FULGURATION OF BLADDER;  Surgeon: Cleon Gustin, MD;  Location: [facility];  Service: Urology;  Laterality: N/A;  . MASS EXCISION Right 04/07/2016   Procedure: EXCISION/BIOPSY SOFT TISSUE MASS RIGHT THIGH;  Surgeon: Aviva Signs, MD;  Location: [facility];  Service: General;  Laterality: Right;  . port a cath placed    .  PORTACATH PLACEMENT Left 04/28/2016   Procedure: INSERTION PORT-A-CATH;  Surgeon: Aviva Signs, MD;  Location: [facility];  Service: General;  Laterality: Left;     reports that she has never smoked. She has never used smokeless tobacco. She reports current alcohol use of about 1.0 standard drinks of alcohol per week. She reports that she does not use drugs.  No Known Allergies  Family History  Problem Relation Age of Onset  . Hypertension Mother   . Cancer Mother    . Hypertension Father   . Heart disease Maternal Grandmother    Prior to Admission medications   Medication Sig Start Date End Date Taking? Authorizing Provider  amLODipine (NORVASC) 5 MG tablet Take 1 tablet (5 mg total) by mouth daily. Patient taking differently: Take 10 mg by mouth daily.  06/16/16  Yes Penland, Kelby Fam, MD  potassium chloride SA (K-DUR,KLOR-CON) 20 MEQ tablet Take 1 tablet (20 mEq total) by mouth daily. 07/07/16  Yes Baird Cancer, PA-C  Vitamin D, Ergocalciferol, (DRISDOL) 50000 units CAPS capsule Take 50,000 Units by mouth every Monday. Takes on Mondays.   Yes [provider]  ondansetron (ZOFRAN) 8 MG tablet TAKE 1 TABLET BY MOUTH 2 TIMES DAILY AS NEEDED FOR REFRACTORY NAUSEA/VOMITING. START ON DAY 3 AFTER CYCLOPHOSAMIDE. Patient not taking: Reported on 09/17/2018 06/15/16   Patrici Ranks, MD  traMADol Veatrice Bourbon) 50 MG tablet Take one every 6-8 hours for headache not helped by motrin Patient not taking: Reported on 09/17/2018 01/24/18   Milton Ferguson, MD  allopurinol (ZYLOPRIM) 300 MG tablet Take 1 tablet (300 mg total) by mouth daily. 04/29/16 01/13/17  Penland, Kelby Fam, MD  prochlorperazine (COMPAZINE) 10 MG tablet Take 1 tablet (10 mg total) by mouth every 6 (six) hours as needed (Nausea or vomiting). 04/29/16 01/13/17  Patrici Ranks, MD    Physical Exam: Vitals:   09/17/18 2145 09/17/18 2159 09/17/18 2200 09/17/18 2206  BP:   (!) 155/105   Pulse: (!) 112  (!) 113 (!) 114  Resp: 17  18 (!) 24  Temp:  97.9 F (36.6 C)    TempSrc:  Oral    SpO2: 100%  98% 99%  Weight:      Height:        Constitutional: Looks acutely ill, but in NAD. Eyes: PERRL, lids and conjunctivae are injected ENMT: Mucous membranes look mildly dry.  Fluid level right TM.  Posterior pharynx clear of any exudate or lesions, but looks mildly erythematous. Neck: normal, supple, no masses, no thyromegaly Respiratory: Mild rhonchi, no wheezing, no crackles. Normal  respiratory effort. No accessory muscle use.  Cardiovascular: Tachycardic at 112 bpm, no murmurs / rubs / gallops. No extremity edema. 2+ pedal pulses. No carotid bruits.  Abdomen: Obese, soft, no tenderness, no masses palpated. No hepatosplenomegaly. Bowel sounds positive.  Musculoskeletal: no clubbing / cyanosis. Good ROM, no contractures. Normal muscle tone.  Skin: no rashes, lesions, ulcers. No induration examination. Neurologic: CN 2-12 grossly intact. Sensation intact, DTR normal. Strength 5/5 in all 4.  Psychiatric: Normal judgment and insight. Alert and oriented x 3. Normal mood.   Labs on Admission: I have personally reviewed following labs and imaging studies  CBC: Recent Labs  Lab 09/17/18 1935  WBC 6.3  NEUTROABS 3.7  HGB 14.0  HCT 45.3  MCV 85.0  PLT 697*   Basic Metabolic Panel: Recent Labs  Lab 09/17/18 1935 09/17/18 1952  NA 134*  --   K 3.1*  --  CL 99  --   CO2 27  --   GLUCOSE 88  --   BUN 9  --   CREATININE 0.66  --   CALCIUM 9.1  --   MG  --  2.1  PHOS  --  3.3   GFR: Estimated Creatinine Clearance: 125.8 mL/min (by C-G formula based on SCr of 0.66 mg/dL). Liver Function Tests: Recent Labs  Lab 09/17/18 1935  AST 18  ALT 15  ALKPHOS 37*  BILITOT 0.5  PROT 7.6  ALBUMIN 3.7   No results for input(s): LIPASE, AMYLASE in the last 168 hours. No results for input(s): AMMONIA in the last 168 hours. Coagulation Profile: No results for input(s): INR, PROTIME in the last 168 hours. Cardiac Enzymes: Recent Labs  Lab 09/17/18 1935  TROPONINI <0.03   BNP (last 3 results) No results for input(s): PROBNP in the last 8760 hours. HbA1C: No results for input(s): HGBA1C in the last 72 hours. CBG: No results for input(s): GLUCAP in the last 168 hours. Lipid Profile: No results for input(s): CHOL, HDL, LDLCALC, TRIG, CHOLHDL, LDLDIRECT in the last 72 hours. Thyroid Function Tests: No results for input(s): TSH, T4TOTAL, FREET4, T3FREE, THYROIDAB in  the last 72 hours. Anemia Panel: No results for input(s): VITAMINB12, FOLATE, FERRITIN, TIBC, IRON, RETICCTPCT in the last 72 hours. Urine analysis:    Component Value Date/Time   COLORURINE YELLOW 09/17/2018 2046   APPEARANCEUR HAZY (A) 09/17/2018 2046   LABSPEC 1.017 09/17/2018 2046   PHURINE 6.0 09/17/2018 2046   GLUCOSEU NEGATIVE 09/17/2018 2046   HGBUR NEGATIVE 09/17/2018 2046   BILIRUBINUR NEGATIVE 09/17/2018 2046   BILIRUBINUR neg 02/16/2013 1719   KETONESUR NEGATIVE 09/17/2018 2046   PROTEINUR NEGATIVE 09/17/2018 2046   UROBILINOGEN 0.2 11/01/2014 1937   NITRITE NEGATIVE 09/17/2018 2046   LEUKOCYTESUR NEGATIVE 09/17/2018 2046    Radiological Exams on Admission: Dg Chest 2 View  Result Date: 09/17/2018 CLINICAL DATA:  Diagnosed with the flu with beginning this month. Syncope. EXAM: CHEST - 2 VIEW COMPARISON:  04/28/2016 FINDINGS: The heart size and mediastinal contours are within normal limits. Both lungs are clear. The visualized skeletal structures are unremarkable. IMPRESSION: No active cardiopulmonary disease. Electronically Signed   By: Ashley Royalty M.D.   On: 09/17/2018 21:04   Ct Head Wo Contrast  Result Date: 09/17/2018 CLINICAL DATA:  Recurrent syncopal episodes today. Head injury. History of non-Hodgkin's lymphoma, hypertension. EXAM: CT HEAD WITHOUT CONTRAST TECHNIQUE: Contiguous axial images were obtained from the base of the skull through the vertex without intravenous contrast. COMPARISON:  MRI of the head April 26, 2018 FINDINGS: BRAIN: No intraparenchymal hemorrhage, mass effect nor midline shift. The ventricles and sulci are normal. No acute large vascular territory infarcts. No abnormal extra-axial fluid collections. Basal cisterns are patent. Expanded partially empty sella. VASCULAR: Unremarkable. SKULL/SOFT TISSUES: No skull fracture. No significant soft tissue swelling. ORBITS/SINUSES: The included ocular globes and orbital contents are normal.Mild paranasal  sinus mucosal thickening. Small RIGHT mastoid effusion without air cell coalescence. Soft tissue RIGHT middle ear. OTHER: None. IMPRESSION: 1. No acute intracranial process. 2. Partially empty sella; otherwise negative noncontrast CT HEAD. 3. Mild paranasal sinusitis.  RIGHT middle ear and mastoid effusion. Electronically Signed   By: Elon Alas M.D.   On: 09/17/2018 20:31   Ct Angio Chest Pe W/cm &/or Wo Cm  Result Date: 09/17/2018 CLINICAL DATA:  Syncope EXAM: CT ANGIOGRAPHY CHEST WITH CONTRAST TECHNIQUE: Multidetector CT imaging of the chest was performed using the standard protocol  during bolus administration of intravenous contrast. Multiplanar CT image reconstructions and MIPs were obtained to evaluate the vascular anatomy. CONTRAST:  162mL ISOVUE-370 IOPAMIDOL (ISOVUE-370) INJECTION 76% COMPARISON:  07/30/2018 FINDINGS: Cardiovascular: No filling defects in the pulmonary arteries to suggest pulmonary emboli. Heart is normal size. Aorta is normal caliber. Mediastinum/Nodes: Bilateral axillary adenopathy. Index right axillary lymph node measures 2.1 cm on image 26, stable since prior study. Index left axillary node has a short axis diameter of 12 mm on image 24, stable. Small scattered mediastinal lymph nodes, none pathologically enlarged. Lungs/Pleura: Lungs are clear. No focal airspace opacities or suspicious nodules. No effusions. Upper Abdomen: Imaging into the upper abdomen shows no acute findings. Musculoskeletal: Chest wall soft tissues are unremarkable. No acute bony abnormality. Review of the MIP images confirms the above findings. IMPRESSION: No evidence of pulmonary embolus. Bilateral axillary adenopathy is stable since prior study. Electronically Signed   By: Rolm Baptise M.D.   On: 09/17/2018 21:16   Ct Cervical Spine Wo Contrast  Result Date: 09/17/2018 CLINICAL DATA:  passed out twice today.Hit her head on a chair when she passed out a second time. Friend says she went  "unresponsive" twice on the way over here. History of hodgkin's lymphoma. EXAM: CT CERVICAL SPINE WITHOUT CONTRAST TECHNIQUE: Multidetector CT imaging of the cervical spine was performed without intravenous contrast. Multiplanar CT image reconstructions were also generated. COMPARISON:  Current head CT. FINDINGS: Alignment: Normal. Skull base and vertebrae: No acute fracture. No primary bone lesion or focal pathologic process. Soft tissues and spinal canal: No prevertebral fluid or swelling. No visible canal hematoma. There are scattered prominent neck lymph nodes, greater on the left, largest a jugular digastric node measuring 12 mm in short axis. These are presumed reactive. Disc levels: Discs are well maintained in height. No degenerative changes. No evidence of a disc herniation. Central spinal canal and neural foramina are well preserved. Upper chest: Left anterior chest wall Port-A-Cath. No acute findings. Other: None. IMPRESSION: Normal CT scan of the cervical spine. Electronically Signed   By: Lajean Manes M.D.   On: 09/17/2018 20:50    EKG: Independently reviewed.  Vent. rate 124 BPM PR interval 140 ms QRS duration 68 ms QT/QTc 320/459 ms P-R-T axes 56 38 45 Sinus tachycardia Possible Left atrial enlargement  Assessment/Plan Principal Problem:   Syncope Observation/telemetry. Continue IV fluids. Replace electrolytes. Trend troponin level. Check repeat EKG. Check echocardiogram.  Active Problems:   Influenza A The patient is still +3 weeks after treatment. She states she had a relapse in symptoms after initial treatment.  Could she have being infected by a different strain?  Resume Tamiflu 75 mg p.o. twice daily.  Supplemental oxygen. Bronchodilators as needed.    Acute otitis media with effusion of right ear The patient had a positive swab for strep group A. Continue ceftriaxone 2 g IVPB every 24 hours.    Hypertension Continue amlodipine 10 mg p.o. daily. Monitor blood  pressure.    Sleep apnea Cannot tolerate CPAP well. Nasal cannula oxygen while in the hospital.    Cervical lymphadenopathy Size is stable per imaging reports.    Hypokalemia Potassium has been supplemented. Follow-up potassium level.    Hyponatremia Replacing. Follow-up sodium level.     DVT prophylaxis: SQ heparin. Code Status: Full code. Family Communication: Disposition Plan: Observation for Dr. Susa Day replacement, syncope work-up and treatment. Consults called: Admission status: Observation/telemetry.   Reubin Milan MD [facility]  09/17/2018, 10:15 PM

## 2018-09-17 NOTE — ED Notes (Signed)
Pt began to have dizziness while ambulating around nurses desk, assisted pt in a wheelchair and wheeled back to room.  EDP made aware

## 2018-09-17 NOTE — ED Provider Notes (Signed)
[facility] EMERGENCY DEPARTMENT Provider Note   CSN: [id number] Arrival date & time: 09/17/18  1628     History   Chief Complaint Chief Complaint  Patient presents with  . Loss of Consciousness    HPI [name] is a 33 y.o. female.  HPI  Pt was seen at [facility]. Per pt, c/o sudden onset and resolution of 2 separate episodes of "passing out" that occurred today. Pt states she "passed out on the toilet" while urinating, and also "passed out after standing up from my desk chair and walking a few steps." Pt states she has sore throat, runny/stuffy nose and cough for the past several days. Has been associated with decreased PO intake.  Pt states she was dx with influenza A on 08/22/18, and took a course of tamiflu with improvement. Denies N/V/D, no abd pain, no neck or back pain, no CP/palpitations, no SOB, no focal motor weakness, no tingling/numbness in extremities, no seizure activity, no confusion/AMS, no incont bowel/bladder.    Past Medical History:  Diagnosis Date  . Anxiety   . Asthma    had since childhood; has not had to use inhaler or nebulizer for 8-9 years. Been doing well.  . Cervical lymphadenopathy    left groin lymph node, axillary lymph nodes:  seen on PET scan 02/2018  . Dyspnea   . Headache(784.0)    usually controlled c Tylenol when not pregnant, but more painful headaches  during preg. due to hypertension.  . Hidradenitis suppurativa   . 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 Hypertension December 2011   Been on Aldomet since Dec. 2011  . Iron deficiency 01/13/2017  . 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 Sleep apnea     Patient Active Problem List   Diagnosis Date Noted  . Iron deficiency 01/13/2017  . 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 Encounter for gynecological examination 06/01/2014  . Hypertension 02/02/2013  . Abscess of breast, right 09/01/2012  . Hypertension in pregnancy,  preeclampsia, severe, antepartum 12/11/2011  . Chronic hypertension complicating or reason for care during pregnancy 12/11/2011    Past Surgical History:  Procedure Laterality Date  . ABDOMINAL HYSTERECTOMY  05/2017  . ADENOIDECTOMY     age 86  . BREAST RECONSTRUCTION  December 2001   breast reduction at age 89  . FULGURATION OF BLADDER TUMOR N/A 07/23/2016   Procedure: CYSTOSCOPY, BIOPSY AND FULGURATION OF BLADDER;  Surgeon: Cleon Gustin, MD;  Location: [facility];  Service: Urology;  Laterality: N/A;  . MASS EXCISION Right 04/07/2016   Procedure: EXCISION/BIOPSY SOFT TISSUE MASS RIGHT THIGH;  Surgeon: Aviva Signs, MD;  Location: [facility];  Service: General;  Laterality: Right;  . port a cath placed    . PORTACATH PLACEMENT Left 04/28/2016   Procedure: INSERTION PORT-A-CATH;  Surgeon: Aviva Signs, MD;  Location: [facility];  Service: General;  Laterality: Left;     OB History    Gravida  3   Para  2   Term  1   Preterm  1   AB  1   Living  2     SAB  1   TAB  0   Ectopic  0   Multiple  0   Live Births  1            Home Medications    Prior to Admission medications   Medication Sig Start Date End Date Taking? Authorizing Provider  amLODipine (NORVASC) 5 MG tablet Take 1 tablet (5 mg total) by mouth daily. Patient taking differently: Take 10 mg by mouth daily.  06/16/16  Yes Penland, Kelby Fam, MD  potassium chloride SA (K-DUR,KLOR-CON) 20 MEQ tablet Take 1 tablet (20 mEq total) by mouth daily. 07/07/16  Yes Baird Cancer, PA-C  Vitamin D, Ergocalciferol, (DRISDOL) 50000 units CAPS capsule Take 50,000 Units by mouth every Monday. Takes on Mondays.   Yes [provider]  ondansetron (ZOFRAN) 8 MG tablet TAKE 1 TABLET BY MOUTH 2 TIMES DAILY AS NEEDED FOR REFRACTORY NAUSEA/VOMITING. START ON DAY 3 AFTER CYCLOPHOSAMIDE. Patient not taking: Reported on 09/17/2018 06/15/16   Patrici Ranks, MD  traMADol Veatrice Bourbon) 50 MG tablet Take one every 6-8 hours for  headache not helped by motrin Patient not taking: Reported on 09/17/2018 01/24/18   Milton Ferguson, MD  allopurinol (ZYLOPRIM) 300 MG tablet Take 1 tablet (300 mg total) by mouth daily. 04/29/16 01/13/17  Penland, Kelby Fam, MD  prochlorperazine (COMPAZINE) 10 MG tablet Take 1 tablet (10 mg total) by mouth every 6 (six) hours as needed (Nausea or vomiting). 04/29/16 01/13/17  Penland, Kelby Fam, MD    Family History Family History  Problem Relation Age of Onset  . Hypertension Mother   . Cancer Mother   . Hypertension Father   . Heart disease Maternal Grandmother     Social History Social History   Tobacco Use  . Smoking status: Never Smoker  . Smokeless tobacco: Never Used  Substance Use Topics  . Alcohol use: Yes    Alcohol/week: 1.0 standard drinks    Types: 1 Glasses of wine per week  . Drug use: No     Allergies   Patient has no known allergies.   Review of Systems Review of Systems ROS: Statement: All systems negative except as marked or noted in the HPI; Constitutional: Negative for fever and chills. ; ; Eyes: Negative for eye pain, redness and discharge. ; ; ENMT: Negative for ear pain, hoarseness, +nasal congestion, sinus pressure and sore throat. ; ; Cardiovascular: Negative for chest pain, palpitations, diaphoresis, dyspnea and peripheral edema. ; ; Respiratory: +cough. Negative for wheezing and stridor. ; ; Gastrointestinal: Negative for nausea, vomiting, diarrhea, abdominal pain, blood in stool, hematemesis, jaundice and rectal bleeding. . ; ; Genitourinary: Negative for dysuria, flank pain and hematuria. ; ; Musculoskeletal: Negative for back pain and neck pain. Negative for swelling and trauma.; ; Skin: Negative for pruritus, rash, abrasions, blisters, bruising and skin lesion.; ; Neuro: Negative for headache, lightheadedness and neck stiffness. Negative for extremity weakness, paresthesias, involuntary movement, seizure and +syncope.       Physical Exam Updated Vital  Signs BP (!) 155/100   Pulse (!) 115   Temp 99.1 F (37.3 C) (Oral)   Resp 20   Ht 5\' 3"  (1.6 m)   Wt 122.5 kg   LMP 02/02/2013   SpO2 100%   BMI 47.83 kg/m    Patient Vitals for the past 24 hrs:  BP Temp Temp src Pulse Resp SpO2 Height Weight  09/17/18 2142 - - - (!) 113 20 97 % - -  09/17/18 2115 (!) 164/107 - - (!) 115 15 100 % - -  09/17/18 2000 (!) 155/100 - - (!) 115 20 100 % - -  09/17/18 1945 - - - (!) 110 (!) 25 100 % - -  09/17/18 1930 (!) 153/116 - - (!) 115 (!) 22 99 % - -  09/17/18 1923 - - - Marland Kitchen)  113 (!) 25 97 % - -  09/17/18 1920 - - - (!) 111 20 98 % - -  09/17/18 1915 - - - (!) 115 (!) 23 98 % - -  09/17/18 1913 (!) 149/115 - - (!) 118 11 98 % - -  09/17/18 1650 (!) 151/108 99.1 F (37.3 C) Oral (!) 124 16 100 % 5\' 3"  (1.6 m) 122.5 kg  09/17/18 1649 - 99.1 F (37.3 C) Oral (!) 124 12 100 % - -         20:05 Orthostatic Vital Signs AH  Orthostatic Lying   BP- Lying: 160/112Abnormal   Pulse- Lying: 116      Orthostatic Sitting  BP- Sitting: 149/107Abnormal   Pulse- Sitting: 115      Orthostatic Standing at 0 minutes  BP- Standing at 0 minutes: 156/114Abnormal   Pulse- Standing at 0 minutes: 119      Orthostatic Standing at 3 minutes  BP- Standing at 3 minutes: (not able to perform)    Physical Exam 1945: Physical examination:  Nursing notes reviewed; Vital signs and O2 SAT reviewed;  Constitutional: Well developed, Well nourished, In no acute distress; Head:  Normocephalic, atraumatic; Eyes: EOMI, PERRL, No scleral icterus; ENMT: Clear fluid levels behind R>L TM. +edemetous nasal turbinates bilat with clear rhinorrhea. Mouth and pharynx without lesions. No tonsillar exudates. No intra-oral edema. No submandibular or sublingual edema. No hoarse voice, no drooling, no stridor. No pain with manipulation of larynx. No trismus. Mouth and pharynx normal, Mucous membranes dry; Neck: Supple, Full range of motion, +cervical lymphadenopathy; Cardiovascular:  Tachycardic rate and rhythm, No gallop; Respiratory: Breath sounds clear & equal bilaterally, No wheezes. Speaking full sentences with ease, Normal respiratory effort/excursion; Chest: Nontender, Movement normal; Abdomen: Soft, Nontender, Nondistended, Normal bowel sounds; Genitourinary: No CVA tenderness; Extremities: Peripheral pulses normal, No tenderness, No edema, No calf edema or asymmetry.; Neuro: AA&Ox3, Major CN grossly intact. No facial droop. Speech clear. No gross focal motor or sensory deficits in extremities.; Skin: Color normal, Warm, Dry.    ED Treatments / Results  Labs (all labs ordered are listed, but only abnormal results are displayed)   EKG EKG Interpretation  Date/Time:  Friday September 17 2018 16:57:02 EST Ventricular Rate:  124 PR Interval:  140 QRS Duration: 68 QT Interval:  320 QTC Calculation: 459 R Axis:   38 Text Interpretation:  Sinus tachycardia Possible Left atrial enlargement When compared with ECG of 07/21/2016 No significant change was found Confirmed by Francine Graven [phone]) on 09/17/2018 8:12:01 PM   Radiology   Procedures Procedures (including critical care time)  Medications Ordered in ED Medications  sodium chloride 0.9 % bolus 1,000 mL (has no administration in time range)  0.9 %  sodium chloride infusion (has no administration in time range)  morphine 2 MG/ML injection 2 mg (2 mg Intravenous Given 09/17/18 2153)  cefTRIAXone (ROCEPHIN) 2 g in sodium chloride 0.9 % 100 mL IVPB (has no administration in time range)  sodium chloride 0.9 % bolus 1,000 mL (1,000 mLs Intravenous New Bag/Given 09/17/18 1954)  iopamidol (ISOVUE-370) 76 % injection 100 mL (100 mLs Intravenous Contrast Given 09/17/18 2101)  potassium chloride SA (K-DUR,KLOR-CON) CR tablet 40 mEq (40 mEq Oral Given 09/17/18 2115)     Initial Impression / Assessment and Plan / ED Course  I have reviewed the triage vital signs and the nursing notes.  Pertinent labs & imaging  results that were available during my care of the patient were reviewed by me  and considered in my medical decision making (see chart for details).  MDM Reviewed: previous chart, nursing note and vitals Reviewed previous: labs, ECG and MRI Interpretation: labs, ECG, x-ray and CT scan Total time providing critical care: 30-74 minutes. This excludes time spent performing separately reportable procedures and services. Consults: admitting MD   CRITICAL CARE Performed by: Francine Graven Total critical care time: 35 minutes Critical care time was exclusive of separately billable procedures and treating other patients. Critical care was necessary to treat or prevent imminent or life-threatening deterioration. Critical care was time spent personally by me on the following activities: development of treatment plan with patient and/or surrogate as well as nursing, discussions with consultants, evaluation of patient's response to treatment, examination of patient, obtaining history from patient or surrogate, ordering and performing treatments and interventions, ordering and review of laboratory studies, ordering and review of radiographic studies, pulse oximetry and re-evaluation of patient's condition.   Results for orders placed or performed during the hospital encounter of 09/17/18  Group A Strep by PCR  Result Value Ref Range   Group A Strep by PCR DETECTED (A) NOT DETECTED  Comprehensive metabolic panel  Result Value Ref Range   Sodium 134 (L) 135 - 145 mmol/L   Potassium 3.1 (L) 3.5 - 5.1 mmol/L   Chloride 99 98 - 111 mmol/L   CO2 27 22 - 32 mmol/L   Glucose, Bld 88 70 - 99 mg/dL   BUN 9 6 - 20 mg/dL   Creatinine, Ser 0.66 0.44 - 1.00 mg/dL   Calcium 9.1 8.9 - 10.3 mg/dL   Total Protein 7.6 6.5 - 8.1 g/dL   Albumin 3.7 3.5 - 5.0 g/dL   AST 18 15 - 41 U/L   ALT 15 0 - 44 U/L   Alkaline Phosphatase 37 (L) 38 - 126 U/L   Total Bilirubin 0.5 0.3 - 1.2 mg/dL   GFR calc non Af Amer >60  >60 mL/min   GFR calc Af Amer >60 >60 mL/min   Anion gap 8 5 - 15  Troponin I - Once  Result Value Ref Range   Troponin I <0.03 <0.03 ng/mL  CBC with Differential  Result Value Ref Range   WBC 6.3 4.0 - 10.5 K/uL   RBC 5.33 (H) 3.87 - 5.11 MIL/uL   Hemoglobin 14.0 12.0 - 15.0 g/dL   HCT 45.3 36.0 - 46.0 %   MCV 85.0 80.0 - 100.0 fL   MCH 26.3 26.0 - 34.0 pg   MCHC 30.9 30.0 - 36.0 g/dL   RDW 13.7 11.5 - 15.5 %   Platelets 425 (H) 150 - 400 K/uL   nRBC 0.0 0.0 - 0.2 %   Neutrophils Relative % 59 %   Neutro Abs 3.7 1.7 - 7.7 K/uL   Lymphocytes Relative 24 %   Lymphs Abs 1.5 0.7 - 4.0 K/uL   Monocytes Relative 14 %   Monocytes Absolute 0.9 0.1 - 1.0 K/uL   Eosinophils Relative 2 %   Eosinophils Absolute 0.2 0.0 - 0.5 K/uL   Basophils Relative 1 %   Basophils Absolute 0.0 0.0 - 0.1 K/uL   Immature Granulocytes 0 %   Abs Immature Granulocytes 0.02 0.00 - 0.07 K/uL  Urinalysis, Routine w reflex microscopic  Result Value Ref Range   Color, Urine YELLOW YELLOW   APPearance HAZY (A) CLEAR   Specific Gravity, Urine 1.017 1.005 - 1.030   pH 6.0 5.0 - 8.0   Glucose, UA NEGATIVE NEGATIVE mg/dL  Hgb urine dipstick NEGATIVE NEGATIVE   Bilirubin Urine NEGATIVE NEGATIVE   Ketones, ur NEGATIVE NEGATIVE mg/dL   Protein, ur NEGATIVE NEGATIVE mg/dL   Nitrite NEGATIVE NEGATIVE   Leukocytes, UA NEGATIVE NEGATIVE  D-dimer, quantitative  Result Value Ref Range   D-Dimer, Quant 0.54 (H) 0.00 - 0.50 ug/mL-FEU   Dg Chest 2 View Result Date: 09/17/2018 CLINICAL DATA:  Diagnosed with the flu with beginning this month. Syncope. EXAM: CHEST - 2 VIEW COMPARISON:  04/28/2016 FINDINGS: The heart size and mediastinal contours are within normal limits. Both lungs are clear. The visualized skeletal structures are unremarkable. IMPRESSION: No active cardiopulmonary disease. Electronically Signed   By: Ashley Royalty M.D.   On: 09/17/2018 21:04   Ct Head Wo Contrast Result Date: 09/17/2018 CLINICAL DATA:   Recurrent syncopal episodes today. Head injury. History of non-Hodgkin's lymphoma, hypertension. EXAM: CT HEAD WITHOUT CONTRAST TECHNIQUE: Contiguous axial images were obtained from the base of the skull through the vertex without intravenous contrast. COMPARISON:  MRI of the head April 26, 2018 FINDINGS: BRAIN: No intraparenchymal hemorrhage, mass effect nor midline shift. The ventricles and sulci are normal. No acute large vascular territory infarcts. No abnormal extra-axial fluid collections. Basal cisterns are patent. Expanded partially empty sella. VASCULAR: Unremarkable. SKULL/SOFT TISSUES: No skull fracture. No significant soft tissue swelling. ORBITS/SINUSES: The included ocular globes and orbital contents are normal.Mild paranasal sinus mucosal thickening. Small RIGHT mastoid effusion without air cell coalescence. Soft tissue RIGHT middle ear. OTHER: None. IMPRESSION: 1. No acute intracranial process. 2. Partially empty sella; otherwise negative noncontrast CT HEAD. 3. Mild paranasal sinusitis.  RIGHT middle ear and mastoid effusion. Electronically Signed   By: Elon Alas M.D.   On: 09/17/2018 20:31   Ct Cervical Spine Wo Contrast Result Date: 09/17/2018 CLINICAL DATA:  passed out twice today.Hit her head on a chair when she passed out a second time. Friend says she went "unresponsive" twice on the way over here. History of hodgkin's lymphoma. EXAM: CT CERVICAL SPINE WITHOUT CONTRAST TECHNIQUE: Multidetector CT imaging of the cervical spine was performed without intravenous contrast. Multiplanar CT image reconstructions were also generated. COMPARISON:  Current head CT. FINDINGS: Alignment: Normal. Skull base and vertebrae: No acute fracture. No primary bone lesion or focal pathologic process. Soft tissues and spinal canal: No prevertebral fluid or swelling. No visible canal hematoma. There are scattered prominent neck lymph nodes, greater on the left, largest a jugular digastric node measuring  12 mm in short axis. These are presumed reactive. Disc levels: Discs are well maintained in height. No degenerative changes. No evidence of a disc herniation. Central spinal canal and neural foramina are well preserved. Upper chest: Left anterior chest wall Port-A-Cath. No acute findings. Other: None. IMPRESSION: Normal CT scan of the cervical spine. Electronically Signed   By: Lajean Manes M.D.   On: 09/17/2018 20:50   Ct Angio Chest Pe W/cm &/or Wo Cm Result Date: 09/17/2018 CLINICAL DATA:  Syncope EXAM: CT ANGIOGRAPHY CHEST WITH CONTRAST TECHNIQUE: Multidetector CT imaging of the chest was performed using the standard protocol during bolus administration of intravenous contrast. Multiplanar CT image reconstructions and MIPs were obtained to evaluate the vascular anatomy. CONTRAST:  132mL ISOVUE-370 IOPAMIDOL (ISOVUE-370) INJECTION 76% COMPARISON:  07/30/2018 FINDINGS: Cardiovascular: No filling defects in the pulmonary arteries to suggest pulmonary emboli. Heart is normal size. Aorta is normal caliber. Mediastinum/Nodes: Bilateral axillary adenopathy. Index right axillary lymph node measures 2.1 cm on image 26, stable since prior study. Index left axillary node has  a short axis diameter of 12 mm on image 24, stable. Small scattered mediastinal lymph nodes, none pathologically enlarged. Lungs/Pleura: Lungs are clear. No focal airspace opacities or suspicious nodules. No effusions. Upper Abdomen: Imaging into the upper abdomen shows no acute findings. Musculoskeletal: Chest wall soft tissues are unremarkable. No acute bony abnormality. Review of the MIP images confirms the above findings. IMPRESSION: No evidence of pulmonary embolus. Bilateral axillary adenopathy is stable since prior study. Electronically Signed   By: Rolm Baptise M.D.   On: 09/17/2018 21:16     2150:  Pt clinically appears dehydrated. Multiple IVF boluses given for tachycardia with minimal improvement; will re-check temp.  Pt unable to  stand for orthostatic VS due to weakness and SOB. CT scan ordered; no PE. Pt attempted to ambulate: pt continues to c/o lightheadedness, SOB and was unable to continue to walk. Pt was then escorted by wheelchair back to her stretcher. IVF continues. IV abx started. Will admit. Dx and testing d/w pt and family.  Questions answered.  Verb understanding, agreeable to admit. T/C returned from [facility]. Olevia Bowens, case discussed, including:  HPI, pertinent PM/SHx, VS/PE, dx testing, ED course and treatment:  Agreeable to admit.        Final Clinical Impressions(s) / ED Diagnoses   Final diagnoses:  None    ED Discharge Orders    None       Francine Graven, DO 09/19/18 1412

## 2018-09-17 NOTE — ED Notes (Signed)
Patient tolerated orthostatic vitals for short period of time. Patient was not able to perform standing for 3 minutes. Patient states that she is very dizzy and feeling short of breath at this time. Resp 27 at this time.

## 2018-09-17 NOTE — ED Notes (Signed)
Patient transported to CT 

## 2018-09-18 ENCOUNTER — Observation Stay (HOSPITAL_BASED_OUTPATIENT_CLINIC_OR_DEPARTMENT_OTHER): Payer: 59

## 2018-09-18 ENCOUNTER — Encounter (HOSPITAL_COMMUNITY): Payer: Self-pay | Admitting: *Deleted

## 2018-09-18 DIAGNOSIS — R55 Syncope and collapse: Secondary | ICD-10-CM

## 2018-09-18 DIAGNOSIS — E860 Dehydration: Secondary | ICD-10-CM | POA: Diagnosis not present

## 2018-09-18 DIAGNOSIS — I10 Essential (primary) hypertension: Secondary | ICD-10-CM

## 2018-09-18 DIAGNOSIS — R590 Localized enlarged lymph nodes: Secondary | ICD-10-CM | POA: Diagnosis not present

## 2018-09-18 DIAGNOSIS — E876 Hypokalemia: Secondary | ICD-10-CM

## 2018-09-18 DIAGNOSIS — H65191 Other acute nonsuppurative otitis media, right ear: Secondary | ICD-10-CM | POA: Diagnosis present

## 2018-09-18 DIAGNOSIS — J101 Influenza due to other identified influenza virus with other respiratory manifestations: Secondary | ICD-10-CM | POA: Diagnosis present

## 2018-09-18 LAB — TROPONIN I
Troponin I: <0.03 ng/mL (ref <0.03)
Troponin I: <0.03 ng/mL (ref <0.03)

## 2018-09-18 LAB — CBC WITH DIFFERENTIAL/PLATELET
Abs Immature Granulocytes: 0.01 10*3/uL (ref 0.00–0.07)
Basophils Absolute: 0 10*3/uL (ref 0.0–0.1)
Basophils Relative: 0 %
Eosinophils Absolute: 0.2 10*3/uL (ref 0.0–0.5)
Eosinophils Relative: 3 %
HCT: 42.6 % (ref 36.0–46.0)
Hemoglobin: 12.9 g/dL (ref 12.0–15.0)
Immature Granulocytes: 0 %
Lymphocytes Relative: 27 %
Lymphs Abs: 1.5 10*3/uL (ref 0.7–4.0)
MCH: 25.6 pg — ABNORMAL LOW (ref 26.0–34.0)
MCHC: 30.3 g/dL (ref 30.0–36.0)
MCV: 84.5 fL (ref 80.0–100.0)
MONO ABS: 1 10*3/uL (ref 0.1–1.0)
Monocytes Relative: 18 %
Neutro Abs: 2.8 10*3/uL (ref 1.7–7.7)
Neutrophils Relative %: 52 %
Platelets: 378 10*3/uL (ref 150–400)
RBC: 5.04 MIL/uL (ref 3.87–5.11)
RDW: 14 % (ref 11.5–15.5)
WBC: 5.4 10*3/uL (ref 4.0–10.5)
nRBC: 0 % (ref 0.0–0.2)

## 2018-09-18 LAB — BASIC METABOLIC PANEL
Anion gap: 6 (ref 5–15)
BUN: 6 mg/dL (ref 6–20)
CO2: 23 mmol/L (ref 22–32)
Calcium: 8.5 mg/dL — ABNORMAL LOW (ref 8.9–10.3)
Chloride: 104 mmol/L (ref 98–111)
Creatinine, Ser: 0.56 mg/dL (ref 0.44–1.00)
GFR calc Af Amer: >60 mL/min (ref >60)
GFR calc non Af Amer: >60 mL/min (ref >60)
Glucose, Bld: 101 mg/dL — ABNORMAL HIGH (ref 70–99)
POTASSIUM: 3.4 mmol/L — AB (ref 3.5–5.1)
Sodium: 133 mmol/L — ABNORMAL LOW (ref 135–145)

## 2018-09-18 LAB — RAPID URINE DRUG SCREEN, HOSP PERFORMED
Amphetamines: NOT DETECTED
Barbiturates: NOT DETECTED
Benzodiazepines: NOT DETECTED
Cocaine: NOT DETECTED
Opiates: NOT DETECTED
TETRAHYDROCANNABINOL: NOT DETECTED

## 2018-09-18 LAB — T4, FREE: Free T4: 0.8 ng/dL — ABNORMAL LOW (ref 0.82–1.77)

## 2018-09-18 LAB — ECHOCARDIOGRAM COMPLETE
HEIGHTINCHES: 63 in
Weight: 4504.44 oz

## 2018-09-18 LAB — TSH: TSH: 2.621 u[IU]/mL (ref 0.350–4.500)

## 2018-09-18 LAB — GLUCOSE, CAPILLARY: Glucose-Capillary: 87 mg/dL (ref 70–99)

## 2018-09-18 MED ORDER — OSELTAMIVIR PHOSPHATE 75 MG PO CAPS
75.0000 mg | ORAL_CAPSULE | Freq: Two times a day (BID) | ORAL | Status: DC
  Administered 2018-09-18 – 2018-09-19 (×3): 75 mg via ORAL
  Filled 2018-09-18 (×3): qty 1

## 2018-09-18 MED ORDER — HYDRALAZINE HCL 20 MG/ML IJ SOLN
10.0000 mg | Freq: Four times a day (QID) | INTRAMUSCULAR | Status: DC | PRN
  Administered 2018-09-18: 10 mg via INTRAVENOUS
  Filled 2018-09-18: qty 1

## 2018-09-18 MED ORDER — OSELTAMIVIR PHOSPHATE 75 MG PO CAPS
75.0000 mg | ORAL_CAPSULE | Freq: Once | ORAL | Status: AC
  Administered 2018-09-18: 75 mg via ORAL
  Filled 2018-09-18: qty 1

## 2018-09-18 MED ORDER — AMLODIPINE BESYLATE 5 MG PO TABS
5.0000 mg | ORAL_TABLET | Freq: Every day | ORAL | Status: DC
  Administered 2018-09-18: 5 mg via ORAL
  Filled 2018-09-18: qty 1

## 2018-09-18 MED ORDER — AMOXICILLIN-POT CLAVULANATE 875-125 MG PO TABS
1.0000 | ORAL_TABLET | Freq: Two times a day (BID) | ORAL | Status: DC
  Administered 2018-09-18 – 2018-09-19 (×3): 1 via ORAL
  Filled 2018-09-18 (×3): qty 1

## 2018-09-18 MED ORDER — CARVEDILOL 3.125 MG PO TABS
6.2500 mg | ORAL_TABLET | Freq: Two times a day (BID) | ORAL | Status: DC
  Administered 2018-09-18 – 2018-09-19 (×3): 6.25 mg via ORAL
  Filled 2018-09-18 (×3): qty 2

## 2018-09-18 MED ORDER — SODIUM CHLORIDE 0.9 % IV SOLN
2.0000 g | INTRAVENOUS | Status: DC
  Filled 2018-09-18 (×3): qty 20

## 2018-09-18 NOTE — Progress Notes (Signed)
*  PRELIMINARY RESULTS* Echocardiogram 2D Echocardiogram has been performed.  Leavy Cella 09/18/2018, 12:45 PM

## 2018-09-18 NOTE — Progress Notes (Addendum)
PROGRESS NOTE  [name] MEQ:[id number] DOB: [birth year] DOA: 09/17/2018 PCP: Patient, No Pcp Per  Brief History:  33 year old female with a history of hypertension, Hodgkin's lymphoma, obstructive sleep apnea, anxiety/depression presenting after 2 syncopal episodes on 09/17/2018.  The patient states that her initial episode occurred when she was sitting on the commode urinating.  She did not completely lose consciousness at that time but did feel some dizziness and leaned against the wall.  Approximately 2 hours later, the patient was getting up from her dining room table to walk to her bedroom when she had a syncopal episode.  There is no bowel bladder incontinence.  She does not bite her tongue.  The patient had denied any dizziness, palpitations, chest pain, aura.  However, the patient has been feeling ill since 09/13/2018.  Her symptoms began with a stuffy nose and nonproductive cough.  Over the next 24 to 48 hours, her symptoms progressed to having chest congestion and rhinorrhea with fever up to 102.0 Fahrenheit on 09/16/2018.  In addition, the patient has been complaining of right ear fullness and pain for the past 1 to 2 weeks.  She has not sought medical care.  However, the patient states that her 1 daughter has had a URI type symptoms earlier this past week.  The patient has endorsed decreased oral intake secondary to her illness.  She denies any chest discomfort, palpitations, nausea, vomiting, diarrhea, abdominal pain, dysuria, hematuria.  Upon presentation, the patient was noted to have a sodium 134, potassium 3.1.  Otherwise BMP, LFTs, and CBC were essentially unremarkable.  Patient had temperature of 99.1 F with tachycardia up to 118.  She started IV fluids.  Given a Streptococcus PCR was positive.  She was started on ceftriaxone.  Assessment/Plan: Syncope -Likely secondary to volume depletion and vagally mediated in the setting of the patient's acute illness with  fever -Orthostatic vital signs negative -Continue IV fluids -Remain on telemetry -Echocardiogram  Influenza A -Continue oseltamavir -personally reviewed CXR--no infiltrates or edema  Otitis media -2/1--right TM visualized with erythema and effusion--- no bulging or perforation -Discontinue ceftriaxone -start amox/clav  Essential hypertension -Restart amlodipine  Hypokalemia -Replete -Check magnesium  Hodgkin's lymphoma -Initially diagnosed August 2017 -Status post 6 cycles of R-CHOP -follows Dr. Delton Coombes  Sinus Tachycardia -personally reviewed EKG--no ST-T changes -troponins neg x 3 -remain on telemetry -TSH/Free T4 -Echo -likely reactive due acute medical illness -1/31 CTA chest--neg for PE; right mid ear & mastoid effusion   Disposition Plan:   Home in 1-2 days  Family Communication:   Spouse updated at bedside  Consultants:  none  Code Status:  FULL   DVT Prophylaxis:  [place] Heparin    Procedures: As Listed in Progress Note Above  Antibiotics: Ceftriaxone 1/31 Amox/clav 2/1>>>       Subjective: Patient complains of general malaise.  She complains of right ear fullness.  She denies any chest pain, shortness breath, coughing, hemoptysis, nausea, vomiting, diarrhea abdominal pain.  She denies any dizziness, headache, neck pain.  Objective: Vitals:   09/17/18 2243 09/17/18 2245 09/17/18 2316 09/18/18 0546  BP:   (!) 142/96 133/87  Pulse: (!) 118 (!) 110 (!) 117 (!) 106  Resp: 15 19 (!) 24 18  Temp:   98.4 F (36.9 C) 98.3 F (36.8 C)  TempSrc:   Oral Oral  SpO2: 98% 100% 96% 95%  Weight:   127.7 kg   Height:   5\' 3"  (1.6  m)     Intake/Output Summary (Last 24 hours) at 09/18/2018 0901 Last data filed at 09/18/2018 2353 Gross per 24 hour  Intake 3695 ml  Output 1 ml  Net 3694 ml   Weight change:  Exam:   General:  Pt is alert, follows commands appropriately, not in acute distress  HEENT: No icterus, No thrush, No neck mass,  [place]/AT  Cardiovascular: RRR, S1/S2, no rubs, no gallops  Respiratory: Diminished breath sounds at the bases.  Clear to auscultation without wheezing.  Abdomen: Soft/+BS, non tender, non distended, no guarding  Extremities: No edema, No lymphangitis, No petechiae, No rashes, no synovitis   Data Reviewed: I have personally reviewed following labs and imaging studies Basic Metabolic Panel: Recent Labs  Lab 09/17/18 1935 09/17/18 1952 09/18/18 0658  NA 134*  --  133*  K 3.1*  --  3.4*  CL 99  --  104  CO2 27  --  23  GLUCOSE 88  --  101*  BUN 9  --  6  CREATININE 0.66  --  0.56  CALCIUM 9.1  --  8.5*  MG  --  2.1  --   PHOS  --  3.3  --    Liver Function Tests: Recent Labs  Lab 09/17/18 1935  AST 18  ALT 15  ALKPHOS 37*  BILITOT 0.5  PROT 7.6  ALBUMIN 3.7   No results for input(s): LIPASE, AMYLASE in the last 168 hours. No results for input(s): AMMONIA in the last 168 hours. Coagulation Profile: No results for input(s): INR, PROTIME in the last 168 hours. CBC: Recent Labs  Lab 09/17/18 1935 09/18/18 0658  WBC 6.3 5.4  NEUTROABS 3.7 2.8  HGB 14.0 12.9  HCT 45.3 42.6  MCV 85.0 84.5  PLT 425* 378   Cardiac Enzymes: Recent Labs  Lab 09/17/18 1935 09/18/18 0201 09/18/18 0658  TROPONINI <0.03 <0.03 <0.03   BNP: Invalid input(s): POCBNP CBG: Recent Labs  Lab 09/18/18 0731  GLUCAP 87   HbA1C: No results for input(s): HGBA1C in the last 72 hours. Urine analysis:    Component Value Date/Time   COLORURINE YELLOW 09/17/2018 2046   APPEARANCEUR HAZY (A) 09/17/2018 2046   LABSPEC 1.017 09/17/2018 2046   PHURINE 6.0 09/17/2018 2046   GLUCOSEU NEGATIVE 09/17/2018 2046   HGBUR NEGATIVE 09/17/2018 2046   BILIRUBINUR NEGATIVE 09/17/2018 2046   BILIRUBINUR neg 02/16/2013 1719   KETONESUR NEGATIVE 09/17/2018 2046   PROTEINUR NEGATIVE 09/17/2018 2046   UROBILINOGEN 0.2 11/01/2014 1937   NITRITE NEGATIVE 09/17/2018 2046   LEUKOCYTESUR NEGATIVE 09/17/2018  2046   Sepsis Labs: @LABRCNTIP (procalcitonin:4,lacticidven:4) ) Recent Results (from the past 240 hour(s))  Group A Strep by PCR     Status: Abnormal   Collection Time: 09/17/18  7:52 PM  Result Value Ref Range Status   Group A Strep by PCR DETECTED (A) NOT DETECTED Final    Comment: Performed at [facility], [address], [place], [place] [address]     Scheduled Meds: . heparin  5,000 Units Subcutaneous Q8H  . oseltamivir  75 mg Oral BID  . sodium chloride flush  3 mL Intravenous Q12H   Continuous Infusions: . 0.9 % NaCl with KCl 20 mEq / L 125 mL/hr at 09/18/18 0147  . cefTRIAXone (ROCEPHIN)  IV      Procedures/Studies: Dg Chest 2 View  Result Date: 09/17/2018 CLINICAL DATA:  Diagnosed with the flu with beginning this month. Syncope. EXAM: CHEST - 2 VIEW COMPARISON:  04/28/2016 FINDINGS:  The heart size and mediastinal contours are within normal limits. Both lungs are clear. The visualized skeletal structures are unremarkable. IMPRESSION: No active cardiopulmonary disease. Electronically Signed   By: Ashley Royalty M.D.   On: 09/17/2018 21:04   Ct Head Wo Contrast  Result Date: 09/17/2018 CLINICAL DATA:  Recurrent syncopal episodes today. Head injury. History of non-Hodgkin's lymphoma, hypertension. EXAM: CT HEAD WITHOUT CONTRAST TECHNIQUE: Contiguous axial images were obtained from the base of the skull through the vertex without intravenous contrast. COMPARISON:  MRI of the head April 26, 2018 FINDINGS: BRAIN: No intraparenchymal hemorrhage, mass effect nor midline shift. The ventricles and sulci are normal. No acute large vascular territory infarcts. No abnormal extra-axial fluid collections. Basal cisterns are patent. Expanded partially empty sella. VASCULAR: Unremarkable. SKULL/SOFT TISSUES: No skull fracture. No significant soft tissue swelling. ORBITS/SINUSES: The included ocular globes and orbital contents are normal.Mild paranasal sinus mucosal thickening. Small RIGHT  mastoid effusion without air cell coalescence. Soft tissue RIGHT middle ear. OTHER: None. IMPRESSION: 1. No acute intracranial process. 2. Partially empty sella; otherwise negative noncontrast CT HEAD. 3. Mild paranasal sinusitis.  RIGHT middle ear and mastoid effusion. Electronically Signed   By: Elon Alas M.D.   On: 09/17/2018 20:31   Ct Angio Chest Pe W/cm &/or Wo Cm  Result Date: 09/17/2018 CLINICAL DATA:  Syncope EXAM: CT ANGIOGRAPHY CHEST WITH CONTRAST TECHNIQUE: Multidetector CT imaging of the chest was performed using the standard protocol during bolus administration of intravenous contrast. Multiplanar CT image reconstructions and MIPs were obtained to evaluate the vascular anatomy. CONTRAST:  1104mL ISOVUE-370 IOPAMIDOL (ISOVUE-370) INJECTION 76% COMPARISON:  07/30/2018 FINDINGS: Cardiovascular: No filling defects in the pulmonary arteries to suggest pulmonary emboli. Heart is normal size. Aorta is normal caliber. Mediastinum/Nodes: Bilateral axillary adenopathy. Index right axillary lymph node measures 2.1 cm on image 26, stable since prior study. Index left axillary node has a short axis diameter of 12 mm on image 24, stable. Small scattered mediastinal lymph nodes, none pathologically enlarged. Lungs/Pleura: Lungs are clear. No focal airspace opacities or suspicious nodules. No effusions. Upper Abdomen: Imaging into the upper abdomen shows no acute findings. Musculoskeletal: Chest wall soft tissues are unremarkable. No acute bony abnormality. Review of the MIP images confirms the above findings. IMPRESSION: No evidence of pulmonary embolus. Bilateral axillary adenopathy is stable since prior study. Electronically Signed   By: Rolm Baptise M.D.   On: 09/17/2018 21:16   Ct Cervical Spine Wo Contrast  Result Date: 09/17/2018 CLINICAL DATA:  passed out twice today.Hit her head on a chair when she passed out a second time. Friend says she went "unresponsive" twice on the way over here. History  of hodgkin's lymphoma. EXAM: CT CERVICAL SPINE WITHOUT CONTRAST TECHNIQUE: Multidetector CT imaging of the cervical spine was performed without intravenous contrast. Multiplanar CT image reconstructions were also generated. COMPARISON:  Current head CT. FINDINGS: Alignment: Normal. Skull base and vertebrae: No acute fracture. No primary bone lesion or focal pathologic process. Soft tissues and spinal canal: No prevertebral fluid or swelling. No visible canal hematoma. There are scattered prominent neck lymph nodes, greater on the left, largest a jugular digastric node measuring 12 mm in short axis. These are presumed reactive. Disc levels: Discs are well maintained in height. No degenerative changes. No evidence of a disc herniation. Central spinal canal and neural foramina are well preserved. Upper chest: Left anterior chest wall Port-A-Cath. No acute findings. Other: None. IMPRESSION: Normal CT scan of the cervical spine. Electronically Signed  By: Lajean Manes M.D.   On: 09/17/2018 20:50    Orson Eva, DO  [facility] Pager [phone]  If 7PM-7AM, please contact night-coverage [email or web address] Password [email or web address] 09/18/2018, 9:01 AM   LOS: 0 days

## 2018-09-19 DIAGNOSIS — R55 Syncope and collapse: Secondary | ICD-10-CM | POA: Diagnosis not present

## 2018-09-19 DIAGNOSIS — H65191 Other acute nonsuppurative otitis media, right ear: Secondary | ICD-10-CM | POA: Diagnosis not present

## 2018-09-19 DIAGNOSIS — R590 Localized enlarged lymph nodes: Secondary | ICD-10-CM | POA: Diagnosis not present

## 2018-09-19 DIAGNOSIS — E860 Dehydration: Secondary | ICD-10-CM | POA: Diagnosis not present

## 2018-09-19 DIAGNOSIS — E871 Hypo-osmolality and hyponatremia: Secondary | ICD-10-CM

## 2018-09-19 DIAGNOSIS — I428 Other cardiomyopathies: Secondary | ICD-10-CM

## 2018-09-19 LAB — CBC
HCT: 42.8 % (ref 36.0–46.0)
Hemoglobin: 13.1 g/dL (ref 12.0–15.0)
MCH: 25.9 pg — ABNORMAL LOW (ref 26.0–34.0)
MCHC: 30.6 g/dL (ref 30.0–36.0)
MCV: 84.6 fL (ref 80.0–100.0)
Platelets: 394 10*3/uL (ref 150–400)
RBC: 5.06 MIL/uL (ref 3.87–5.11)
RDW: 14 % (ref 11.5–15.5)
WBC: 5.8 10*3/uL (ref 4.0–10.5)
nRBC: 0 % (ref 0.0–0.2)

## 2018-09-19 LAB — URINE CULTURE

## 2018-09-19 LAB — BASIC METABOLIC PANEL
ANION GAP: 7 (ref 5–15)
BUN: 8 mg/dL (ref 6–20)
CO2: 24 mmol/L (ref 22–32)
Calcium: 9 mg/dL (ref 8.9–10.3)
Chloride: 102 mmol/L (ref 98–111)
Creatinine, Ser: 0.56 mg/dL (ref 0.44–1.00)
GFR calc Af Amer: >60 mL/min (ref >60)
GFR calc non Af Amer: >60 mL/min (ref >60)
Glucose, Bld: 93 mg/dL (ref 70–99)
Potassium: 3.4 mmol/L — ABNORMAL LOW (ref 3.5–5.1)
Sodium: 133 mmol/L — ABNORMAL LOW (ref 135–145)

## 2018-09-19 LAB — URINALYSIS, COMPLETE (UACMP) WITH MICROSCOPIC
Bilirubin Urine: NEGATIVE
Glucose, UA: NEGATIVE mg/dL
Hgb urine dipstick: NEGATIVE
Ketones, ur: NEGATIVE mg/dL
Leukocytes, UA: NEGATIVE
Nitrite: NEGATIVE
Protein, ur: NEGATIVE mg/dL
Specific Gravity, Urine: 1.014 (ref 1.005–1.030)
pH: 7 (ref 5.0–8.0)

## 2018-09-19 LAB — GLUCOSE, CAPILLARY: Glucose-Capillary: 81 mg/dL (ref 70–99)

## 2018-09-19 LAB — MAGNESIUM: Magnesium: 1.8 mg/dL (ref 1.7–2.4)

## 2018-09-19 LAB — HIV ANTIBODY (ROUTINE TESTING W REFLEX): HIV Screen 4th Generation wRfx: NONREACTIVE

## 2018-09-19 MED ORDER — GUAIFENESIN ER 600 MG PO TB12: 600.0000 mg | ORAL_TABLET | Freq: Two times a day (BID) | ORAL | Status: DC

## 2018-09-19 MED ORDER — OSELTAMIVIR PHOSPHATE 75 MG PO CAPS: 75.0000 mg | ORAL_CAPSULE | Freq: Two times a day (BID) | ORAL | 0 refills | Status: DC

## 2018-09-19 MED ORDER — GUAIFENESIN ER 600 MG PO TB12
600.0000 mg | ORAL_TABLET | Freq: Two times a day (BID) | ORAL | Status: DC
  Administered 2018-09-19: 600 mg via ORAL
  Filled 2018-09-19: qty 1

## 2018-09-19 MED ORDER — DM-GUAIFENESIN ER 30-600 MG PO TB12: 1.0000 | ORAL_TABLET | Freq: Two times a day (BID) | ORAL | Status: DC

## 2018-09-19 MED ORDER — AMOXICILLIN-POT CLAVULANATE 875-125 MG PO TABS: 1.0000 | ORAL_TABLET | Freq: Two times a day (BID) | ORAL | 0 refills | Status: DC

## 2018-09-19 MED ORDER — FLUTICASONE PROPIONATE 50 MCG/ACT NA SUSP
2.0000 | Freq: Every day | NASAL | Status: DC
  Administered 2018-09-19: 2 via NASAL
  Filled 2018-09-19: qty 16

## 2018-09-19 MED ORDER — CARVEDILOL 6.25 MG PO TABS: 6.2500 mg | ORAL_TABLET | Freq: Two times a day (BID) | ORAL | 1 refills | Status: DC

## 2018-09-19 MED ORDER — FLUTICASONE PROPIONATE 50 MCG/ACT NA SUSP: 2.0000 | Freq: Every day | NASAL | 2 refills | Status: DC

## 2018-09-19 NOTE — Discharge Summary (Addendum)
Physician Discharge Summary  [name] QIH:[id number] DOB: Sep 21, 1985 DOA: 09/17/2018  PCP: Patient, No Pcp Per  Admit date: 09/17/2018 Discharge date: 09/19/2018  Admitted From: Home Disposition:  Home   Recommendations for Outpatient Follow-up:  1. Follow up with PCP in 1-2 weeks 2. Please obtain BMP/CBC in one week     Discharge Condition: Stable CODE STATUS: FULL Diet recommendation: Heart Healthy    Brief/Interim Summary: 33 year old female with a history of hypertension, Hodgkin's lymphoma, obstructive sleep apnea, anxiety/depression presenting after 2 syncopal episodes on 09/17/2018.  The patient states that her initial episode occurred when she was sitting on the commode urinating.  She did not completely lose consciousness at that time but did feel some dizziness and leaned against the wall.  Approximately 2 hours later, the patient was getting up from her dining room table to walk to her bedroom when she had a syncopal episode.  There is no bowel bladder incontinence.  She does not bite her tongue.  The patient had denied any dizziness, palpitations, chest pain, aura.  However, the patient has been feeling ill since 09/13/2018.  Her symptoms began with a stuffy nose and nonproductive cough.  Over the next 24 to 48 hours, her symptoms progressed to having chest congestion and rhinorrhea with fever up to 102.0 Fahrenheit on 09/16/2018.  In addition, the patient has been complaining of right ear fullness and pain for the past 1 to 2 weeks.  She has not sought medical care.  However, the patient states that her 1 daughter has had a URI type symptoms earlier this past week.  The patient has endorsed decreased oral intake secondary to her illness.  She denies any chest discomfort, palpitations, nausea, vomiting, diarrhea, abdominal pain, dysuria, hematuria.  Upon presentation, the patient was noted to have a sodium 134, potassium 3.1.  Otherwise BMP, LFTs, and CBC were essentially  unremarkable.  Patient had temperature of 99.1 F with tachycardia up to 118.  She started IV fluids.  Given a Streptococcus PCR was positive.  She was started on ceftriaxone.  She was sent home with Augmentin for 6 more days.  Echocardiogram was performed that showed EF 45-50% with global HK.  There was thought this may be due to chemotherapeutic effect from her previous R-CHOP versus her recent viral illness, the patient was started on carvedilol, she will need cardiology follow-up and repeat echo in 3 months  Discharge Diagnoses:  Syncope -Likely secondary to volume depletion and vagally mediated in the setting of the patient's acute illness with fever and poor p.o. intake -Orthostatic vital signs negative -Continue IV fluids -Remain on telemetry--personally reviewed--no concerning dysrhythmia -Echocardiogram--EF 45-50%, global HK; no significant viral abnormality  Influenza A -Continue oseltamavir>>> discharge home with 3 more days to complete 5-day course -personally reviewed CXR--no infiltrates or edema  Cardiomyopathy--nonischemic -Newly diagnosed with EF 45-50% on echo -May be related to chemotherapy versus acute illness -Discontinue amlodipine -Start carvedilol -Follow-up echo in 3 months  Otitis media -2/1--right TM visualized with erythema and effusion--- no bulging or perforation -Discontinue ceftriaxone -start amox/clav--> discharge home with 5 more days to complete 1 week course of antibiotics  Essential hypertension -Continue amlodipine in the setting of decreased EF on echo -Start carvedilol  Hypokalemia -Replete -Check magnesium--1.8  Hodgkin's lymphoma -Initially diagnosed August 2017 -Status post 6 cycles of R-CHOP -follows Dr. Delton Coombes  Sinus Tachycardia -personally reviewed EKG--no ST-T changes -troponins neg x 3 -remain on telemetry -TSH 2.621 -Echo--as above -likely reactive due acute medical illness -  1/31 CTA chest--neg for PE; right mid  ear & mastoid effusion  Morbid obesity -BMI 49.87 -Lifestyle modification   Discharge Instructions   Allergies as of 09/19/2018   No Known Allergies     Medication List    STOP taking these medications   amLODipine 5 MG tablet Commonly known as:  NORVASC   ondansetron 8 MG tablet Commonly known as:  ZOFRAN   traMADol 50 MG tablet Commonly known as:  ULTRAM     TAKE these medications   amoxicillin-clavulanate 875-125 MG tablet Commonly known as:  AUGMENTIN Take 1 tablet by mouth every 12 (twelve) hours.   carvedilol 6.25 MG tablet Commonly known as:  COREG Take 1 tablet (6.25 mg total) by mouth 2 (two) times daily with a meal.   fluticasone 50 MCG/ACT nasal spray Commonly known as:  FLONASE Place 2 sprays into both nostrils daily. Start taking on:  September 20, 2018   guaiFENesin 600 MG 12 hr tablet Commonly known as:  MUCINEX Take 1 tablet (600 mg total) by mouth 2 (two) times daily.   oseltamivir 75 MG capsule Commonly known as:  TAMIFLU Take 1 capsule (75 mg total) by mouth 2 (two) times daily.   potassium chloride SA 20 MEQ tablet Commonly known as:  K-DUR,KLOR-CON Take 1 tablet (20 mEq total) by mouth daily.   Vitamin D (Ergocalciferol) 1.25 MG (50000 UT) Caps capsule Commonly known as:  DRISDOL Take 50,000 Units by mouth every Monday. Takes on Mondays.       No Known Allergies  Consultations:  none   Procedures/Studies: Dg Chest 2 View  Result Date: 09/17/2018 CLINICAL DATA:  Diagnosed with the flu with beginning this month. Syncope. EXAM: CHEST - 2 VIEW COMPARISON:  04/28/2016 FINDINGS: The heart size and mediastinal contours are within normal limits. Both lungs are clear. The visualized skeletal structures are unremarkable. IMPRESSION: No active cardiopulmonary disease. Electronically Signed   By: Ashley Royalty M.D.   On: 09/17/2018 21:04   Ct Head Wo Contrast  Result Date: 09/17/2018 CLINICAL DATA:  Recurrent syncopal episodes today. Head  injury. History of non-Hodgkin's lymphoma, hypertension. EXAM: CT HEAD WITHOUT CONTRAST TECHNIQUE: Contiguous axial images were obtained from the base of the skull through the vertex without intravenous contrast. COMPARISON:  MRI of the head April 26, 2018 FINDINGS: BRAIN: No intraparenchymal hemorrhage, mass effect nor midline shift. The ventricles and sulci are normal. No acute large vascular territory infarcts. No abnormal extra-axial fluid collections. Basal cisterns are patent. Expanded partially empty sella. VASCULAR: Unremarkable. SKULL/SOFT TISSUES: No skull fracture. No significant soft tissue swelling. ORBITS/SINUSES: The included ocular globes and orbital contents are normal.Mild paranasal sinus mucosal thickening. Small RIGHT mastoid effusion without air cell coalescence. Soft tissue RIGHT middle ear. OTHER: None. IMPRESSION: 1. No acute intracranial process. 2. Partially empty sella; otherwise negative noncontrast CT HEAD. 3. Mild paranasal sinusitis.  RIGHT middle ear and mastoid effusion. Electronically Signed   By: Elon Alas M.D.   On: 09/17/2018 20:31   Ct Angio Chest Pe W/cm &/or Wo Cm  Result Date: 09/17/2018 CLINICAL DATA:  Syncope EXAM: CT ANGIOGRAPHY CHEST WITH CONTRAST TECHNIQUE: Multidetector CT imaging of the chest was performed using the standard protocol during bolus administration of intravenous contrast. Multiplanar CT image reconstructions and MIPs were obtained to evaluate the vascular anatomy. CONTRAST:  169mL ISOVUE-370 IOPAMIDOL (ISOVUE-370) INJECTION 76% COMPARISON:  07/30/2018 FINDINGS: Cardiovascular: No filling defects in the pulmonary arteries to suggest pulmonary emboli. Heart is normal size. Aorta is normal caliber.  Mediastinum/Nodes: Bilateral axillary adenopathy. Index right axillary lymph node measures 2.1 cm on image 26, stable since prior study. Index left axillary node has a short axis diameter of 12 mm on image 24, stable. Small scattered mediastinal  lymph nodes, none pathologically enlarged. Lungs/Pleura: Lungs are clear. No focal airspace opacities or suspicious nodules. No effusions. Upper Abdomen: Imaging into the upper abdomen shows no acute findings. Musculoskeletal: Chest wall soft tissues are unremarkable. No acute bony abnormality. Review of the MIP images confirms the above findings. IMPRESSION: No evidence of pulmonary embolus. Bilateral axillary adenopathy is stable since prior study. Electronically Signed   By: Rolm Baptise M.D.   On: 09/17/2018 21:16   Ct Cervical Spine Wo Contrast  Result Date: 09/17/2018 CLINICAL DATA:  passed out twice today.Hit her head on a chair when she passed out a second time. Friend says she went "unresponsive" twice on the way over here. History of hodgkin's lymphoma. EXAM: CT CERVICAL SPINE WITHOUT CONTRAST TECHNIQUE: Multidetector CT imaging of the cervical spine was performed without intravenous contrast. Multiplanar CT image reconstructions were also generated. COMPARISON:  Current head CT. FINDINGS: Alignment: Normal. Skull base and vertebrae: No acute fracture. No primary bone lesion or focal pathologic process. Soft tissues and spinal canal: No prevertebral fluid or swelling. No visible canal hematoma. There are scattered prominent neck lymph nodes, greater on the left, largest a jugular digastric node measuring 12 mm in short axis. These are presumed reactive. Disc levels: Discs are well maintained in height. No degenerative changes. No evidence of a disc herniation. Central spinal canal and neural foramina are well preserved. Upper chest: Left anterior chest wall Port-A-Cath. No acute findings. Other: None. IMPRESSION: Normal CT scan of the cervical spine. Electronically Signed   By: Lajean Manes M.D.   On: 09/17/2018 20:50        Discharge Exam: Vitals:   09/19/18 0202 09/19/18 0406  BP: 124/82 126/82  Pulse: (!) 103 (!) 106  Resp: 20 20  Temp: 98.1 F (36.7 C) 98.1 F (36.7 C)  SpO2: 99%  96%   Vitals:   09/18/18 2226 09/19/18 0006 09/19/18 0202 09/19/18 0406  BP: (!) 127/92 112/84 124/82 126/82  Pulse: (!) 101 (!) 102 (!) 103 (!) 106  Resp:  20 20 20   Temp:  97.9 F (36.6 C) 98.1 F (36.7 C) 98.1 F (36.7 C)  TempSrc:  Oral Oral Oral  SpO2: 100% 100% 99% 96%  Weight:      Height:        General: Pt is alert, awake, not in acute distress Cardiovascular: RRR, S1/S2 +, no rubs, no gallops Respiratory: CTA bilaterally, no wheezing, no rhonchi Abdominal: Soft, NT, ND, bowel sounds + Extremities: no edema, no cyanosis   The results of significant diagnostics from this hospitalization (including imaging, microbiology, ancillary and laboratory) are listed below for reference.    Significant Diagnostic Studies: Dg Chest 2 View  Result Date: 09/17/2018 CLINICAL DATA:  Diagnosed with the flu with beginning this month. Syncope. EXAM: CHEST - 2 VIEW COMPARISON:  04/28/2016 FINDINGS: The heart size and mediastinal contours are within normal limits. Both lungs are clear. The visualized skeletal structures are unremarkable. IMPRESSION: No active cardiopulmonary disease. Electronically Signed   By: Ashley Royalty M.D.   On: 09/17/2018 21:04   Ct Head Wo Contrast  Result Date: 09/17/2018 CLINICAL DATA:  Recurrent syncopal episodes today. Head injury. History of non-Hodgkin's lymphoma, hypertension. EXAM: CT HEAD WITHOUT CONTRAST TECHNIQUE: Contiguous axial images were obtained from  the base of the skull through the vertex without intravenous contrast. COMPARISON:  MRI of the head April 26, 2018 FINDINGS: BRAIN: No intraparenchymal hemorrhage, mass effect nor midline shift. The ventricles and sulci are normal. No acute large vascular territory infarcts. No abnormal extra-axial fluid collections. Basal cisterns are patent. Expanded partially empty sella. VASCULAR: Unremarkable. SKULL/SOFT TISSUES: No skull fracture. No significant soft tissue swelling. ORBITS/SINUSES: The included  ocular globes and orbital contents are normal.Mild paranasal sinus mucosal thickening. Small RIGHT mastoid effusion without air cell coalescence. Soft tissue RIGHT middle ear. OTHER: None. IMPRESSION: 1. No acute intracranial process. 2. Partially empty sella; otherwise negative noncontrast CT HEAD. 3. Mild paranasal sinusitis.  RIGHT middle ear and mastoid effusion. Electronically Signed   By: Elon Alas M.D.   On: 09/17/2018 20:31   Ct Angio Chest Pe W/cm &/or Wo Cm  Result Date: 09/17/2018 CLINICAL DATA:  Syncope EXAM: CT ANGIOGRAPHY CHEST WITH CONTRAST TECHNIQUE: Multidetector CT imaging of the chest was performed using the standard protocol during bolus administration of intravenous contrast. Multiplanar CT image reconstructions and MIPs were obtained to evaluate the vascular anatomy. CONTRAST:  154mL ISOVUE-370 IOPAMIDOL (ISOVUE-370) INJECTION 76% COMPARISON:  07/30/2018 FINDINGS: Cardiovascular: No filling defects in the pulmonary arteries to suggest pulmonary emboli. Heart is normal size. Aorta is normal caliber. Mediastinum/Nodes: Bilateral axillary adenopathy. Index right axillary lymph node measures 2.1 cm on image 26, stable since prior study. Index left axillary node has a short axis diameter of 12 mm on image 24, stable. Small scattered mediastinal lymph nodes, none pathologically enlarged. Lungs/Pleura: Lungs are clear. No focal airspace opacities or suspicious nodules. No effusions. Upper Abdomen: Imaging into the upper abdomen shows no acute findings. Musculoskeletal: Chest wall soft tissues are unremarkable. No acute bony abnormality. Review of the MIP images confirms the above findings. IMPRESSION: No evidence of pulmonary embolus. Bilateral axillary adenopathy is stable since prior study. Electronically Signed   By: Rolm Baptise M.D.   On: 09/17/2018 21:16   Ct Cervical Spine Wo Contrast  Result Date: 09/17/2018 CLINICAL DATA:  passed out twice today.Hit her head on a chair when  she passed out a second time. Friend says she went "unresponsive" twice on the way over here. History of hodgkin's lymphoma. EXAM: CT CERVICAL SPINE WITHOUT CONTRAST TECHNIQUE: Multidetector CT imaging of the cervical spine was performed without intravenous contrast. Multiplanar CT image reconstructions were also generated. COMPARISON:  Current head CT. FINDINGS: Alignment: Normal. Skull base and vertebrae: No acute fracture. No primary bone lesion or focal pathologic process. Soft tissues and spinal canal: No prevertebral fluid or swelling. No visible canal hematoma. There are scattered prominent neck lymph nodes, greater on the left, largest a jugular digastric node measuring 12 mm in short axis. These are presumed reactive. Disc levels: Discs are well maintained in height. No degenerative changes. No evidence of a disc herniation. Central spinal canal and neural foramina are well preserved. Upper chest: Left anterior chest wall Port-A-Cath. No acute findings. Other: None. IMPRESSION: Normal CT scan of the cervical spine. Electronically Signed   By: Lajean Manes M.D.   On: 09/17/2018 20:50     Microbiology: Recent Results (from the past 240 hour(s))  Group A Strep by PCR     Status: Abnormal   Collection Time: 09/17/18  7:52 PM  Result Value Ref Range Status   Group A Strep by PCR DETECTED (A) NOT DETECTED Final    Comment: Performed at [facility], [address] [place],  [address]  Urine culture     Status: Abnormal   Collection Time: 09/17/18  8:46 PM  Result Value Ref Range Status   Specimen Description   Final    URINE, CLEAN CATCH Performed at [facility], [address] [place], [place] [address]    Special Requests   Final    NONE Performed at [facility], [address] [place], [place] [address]    Culture MULTIPLE SPECIES PRESENT, SUGGEST RECOLLECTION (A)  Final   Report Status 09/19/2018 FINAL  Final     Labs: Basic Metabolic Panel: Recent Labs  Lab  09/17/18 1935 09/17/18 1952 09/18/18 0658 09/19/18 0504  NA 134*  --  133* 133*  K 3.1*  --  3.4* 3.4*  CL 99  --  104 102  CO2 27  --  23 24  GLUCOSE 88  --  101* 93  BUN 9  --  6 8  CREATININE 0.66  --  0.56 0.56  CALCIUM 9.1  --  8.5* 9.0  MG  --  2.1  --  1.8  PHOS  --  3.3  --   --    Liver Function Tests: Recent Labs  Lab 09/17/18 1935  AST 18  ALT 15  ALKPHOS 37*  BILITOT 0.5  PROT 7.6  ALBUMIN 3.7   No results for input(s): LIPASE, AMYLASE in the last 168 hours. No results for input(s): AMMONIA in the last 168 hours. CBC: Recent Labs  Lab 09/17/18 1935 09/18/18 0658 09/19/18 0504  WBC 6.3 5.4 5.8  NEUTROABS 3.7 2.8  --   HGB 14.0 12.9 13.1  HCT 45.3 42.6 42.8  MCV 85.0 84.5 84.6  PLT 425* 378 394   Cardiac Enzymes: Recent Labs  Lab 09/17/18 1935 09/18/18 0201 09/18/18 0658  TROPONINI <0.03 <0.03 <0.03   BNP: Invalid input(s): POCBNP CBG: Recent Labs  Lab 09/18/18 0731 09/19/18 0724  GLUCAP 87 81    Time coordinating discharge:  36 minutes  Signed:  Orson Eva, DO [facility] Pager: [phone] 09/19/2018, 12:36 PM

## 2018-09-19 NOTE — Progress Notes (Signed)
IV removed to Right AC, 2x2 gauze and paper tape applied to site, patient tolerated well.  Reviewed AVS with patient who verbalized understanding.  Patient to be transported home by her husband.

## 2018-10-13 ENCOUNTER — Ambulatory Visit (INDEPENDENT_AMBULATORY_CARE_PROVIDER_SITE_OTHER): Payer: 59 | Admitting: Cardiovascular Disease

## 2018-10-13 ENCOUNTER — Encounter: Payer: Self-pay | Admitting: Cardiovascular Disease

## 2018-10-13 VITALS — BP 135/96 | HR 94 | Ht 64.0 in | Wt 282.0 lb

## 2018-10-13 DIAGNOSIS — I10 Essential (primary) hypertension: Secondary | ICD-10-CM

## 2018-10-13 DIAGNOSIS — G4730 Sleep apnea, unspecified: Secondary | ICD-10-CM

## 2018-10-13 DIAGNOSIS — I429 Cardiomyopathy, unspecified: Secondary | ICD-10-CM | POA: Diagnosis not present

## 2018-10-13 DIAGNOSIS — T451X5A Adverse effect of antineoplastic and immunosuppressive drugs, initial encounter: Secondary | ICD-10-CM

## 2018-10-13 DIAGNOSIS — Z9289 Personal history of other medical treatment: Secondary | ICD-10-CM

## 2018-10-13 MED ORDER — LISINOPRIL 5 MG PO TABS: 5.0000 mg | ORAL_TABLET | Freq: Every day | ORAL | 3 refills | Status: DC

## 2018-10-13 NOTE — Progress Notes (Signed)
CARDIOLOGY CONSULT NOTE  Patient ID: [name] MRN: [id number] DOB/AGE: 11-06-1985 33 y.o.  Admit date: (Not on file) Primary Physician: Patient, No Pcp Per Referring Physician: Dr. Shanon Brow Tat  Reason for Consultation: Cardiomyopathy  HPI: [name] is a 33 y.o. female who is being seen today for the evaluation of syncope and cardiomyopathy at the request of Dr. Shanon Brow Tat.  She was recently hospitalized earlier this month for syncope at [facility].  Past medical history includes hypertension, Hodgkin's lymphoma, obstructive sleep apnea, and anxiety and depression.  She reportedly sustained 2 syncopal episodes on 09/17/2018.  Initial episode occurred while she was sitting on the commode and urinating.  She did not completely lose consciousness but felt some dizziness.  2 hours later she was getting up from the dining room table to walk to her bedroom when she passed out.  There was no tongue biting nor bowel or bladder incontinence.  There was no report of antecedent chest pain, palpitations, nor shortness of breath.  She had been feeling ill since 09/13/2018.  Symptoms included nasal congestion and a nonproductive cough.  She then developed a fever up to 102 degrees.  She was given IV fluids and it was felt syncope was due to volume depletion and vasovagal in etiology.  She was not orthostatic.  She tested positive for influenza A and was prescribed oseltamivir.  Renal function was normal.  Troponins were also normal.  Chest x-ray showed no active cardiopulmonary disease.  I personally reviewed the ECG performed on 09/17/2018 which demonstrated sinus tachycardia.  An echocardiogram was performed and demonstrated mildly reduced left ventricular systolic function, LVEF 45 to 50%.  There was mild global hypokinesis and no significant valvular pathology.   Amlodipine was discontinued and she was started on carvedilol.  CT of the chest showed no evidence of  pulmonary embolism with bilateral axillary adenopathy stable since prior study.  She is here with her husband.  She seldom has "chest twinges "in the left inframammary region.  She denies shortness of breath.  She is not compliant with CPAP treatment.  Her husband has witnessed apneic episodes.  She had some feet and ankle swelling when she had been taking amlodipine but this has since resolved since amlodipine cessation.  She denies orthopnea, palpitations, and further episodes of syncope.   Social history: Married.  She has a 53 year old son who wrestles and plays football.  She has a 2-year-old daughter who is a Tourist information centre manager.   No Known Allergies  Current Outpatient Medications  Medication Sig Dispense Refill  . carvedilol (COREG) 6.25 MG tablet Take 1 tablet (6.25 mg total) by mouth 2 (two) times daily with a meal. 60 tablet 1  . potassium chloride SA (K-DUR,KLOR-CON) 20 MEQ tablet Take 1 tablet (20 mEq total) by mouth daily. 30 tablet 0  . Vitamin D, Ergocalciferol, (DRISDOL) 50000 units CAPS capsule Take 50,000 Units by mouth every Monday. Takes on Mondays.     No current facility-administered medications for this visit.     Past Medical History:  Diagnosis Date  . Anxiety   . Asthma    had since childhood; has not had to use inhaler or nebulizer for 8-9 years. Been doing well.  . Cervical lymphadenopathy    left groin lymph node, axillary lymph nodes:  seen on PET scan 02/2018  . Dyspnea   . Headache(784.0)    usually controlled c Tylenol when not pregnant, but more painful headaches  during preg. due to hypertension.  . Hidradenitis suppurativa   . 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 Hypertension December 2011   Been on Aldomet since Dec. 2011  . Iron deficiency 01/13/2017  . Lymphoma, Hodgkin's ([place])    Stage IV  . Sleep apnea     Past Surgical History:  Procedure Laterality Date  . ABDOMINAL HYSTERECTOMY  05/2017  . ADENOIDECTOMY     age  57  . BREAST RECONSTRUCTION  December 2001   breast reduction at age 7  . FULGURATION OF BLADDER TUMOR N/A 07/23/2016   Procedure: CYSTOSCOPY, BIOPSY AND FULGURATION OF BLADDER;  Surgeon: Cleon Gustin, MD;  Location: [facility];  Service: Urology;  Laterality: N/A;  . MASS EXCISION Right 04/07/2016   Procedure: EXCISION/BIOPSY SOFT TISSUE MASS RIGHT THIGH;  Surgeon: Aviva Signs, MD;  Location: [facility];  Service: General;  Laterality: Right;  . port a cath placed    . PORTACATH PLACEMENT Left 04/28/2016   Procedure: INSERTION PORT-A-CATH;  Surgeon: Aviva Signs, MD;  Location: [facility];  Service: General;  Laterality: Left;    Social History   Socioeconomic History  . Marital status: Married    Spouse name: Not on file  . Number of children: Not on file  . Years of education: Not on file  . Highest education level: Not on file  Occupational History  . Not on file  Social Needs  . Financial resource strain: Not hard at all  . Food insecurity:    Worry: Never true    Inability: Never true  . Transportation needs:    Medical: Not on file    Non-medical: Not on file  Tobacco Use  . Smoking status: Never Smoker  . Smokeless tobacco: Never Used  Substance and Sexual Activity  . Alcohol use: Yes    Alcohol/week: 1.0 standard drinks    Types: 1 Glasses of wine per week  . Drug use: No  . Sexual activity: Yes    Birth control/protection: I.U.D.  Lifestyle  . Physical activity:    Days per week: 4 days    Minutes per session: 120 min  . Stress: Not at all  Relationships  . Social connections:    Talks on phone: More than three times a week    Gets together: More than three times a week    Attends religious service: More than 4 times per year    Active member of club or organization: Yes    Attends meetings of clubs or organizations: More than 4 times per year    Relationship status: Married  . Intimate partner violence:    Fear of current or ex partner: No    Emotionally  abused: No    Physically abused: No    Forced sexual activity: No  Other Topics Concern  . Not on file  Social History Narrative  . Not on file     No family history of premature CAD in 1st degree relatives.  Current Meds  Medication Sig  . carvedilol (COREG) 6.25 MG tablet Take 1 tablet (6.25 mg total) by mouth 2 (two) times daily with a meal.  . potassium chloride SA (K-DUR,KLOR-CON) 20 MEQ tablet Take 1 tablet (20 mEq total) by mouth daily.  . Vitamin D, Ergocalciferol, (DRISDOL) 50000 units CAPS capsule Take 50,000 Units by mouth every Monday. Takes on Mondays.      Review of systems complete and found to be negative unless listed above in HPI  Physical exam Blood pressure (!) 135/96, pulse 94, height 5\' 4"  (1.626 m), weight 282 lb (127.9 kg), last menstrual period 02/02/2013, SpO2 98 %. General: NAD Neck: No JVD, no thyromegaly or thyroid nodule.  Lungs: Clear to auscultation bilaterally with normal respiratory effort. CV: Nondisplaced PMI. Regular rate and rhythm, normal S1/S2, no S3/S4, no murmur.  No peripheral edema.  No carotid bruit.    Abdomen: Soft, nontender, no distention.  Skin: Intact without lesions or rashes.  Neurologic: Alert and oriented x 3.  Psych: Normal affect. Extremities: No clubbing or cyanosis.  HEENT: Normal.   ECG: Most recent ECG reviewed.   Labs: Lab Results  Component Value Date/Time   K 3.4 (L) 09/19/2018 05:04 AM   BUN 8 09/19/2018 05:04 AM   CREATININE 0.56 09/19/2018 05:04 AM   CREATININE 0.78 02/16/2013 05:21 PM   ALT 15 09/17/2018 07:35 PM   TSH 2.621 09/18/2018 06:58 AM   TSH 1.480 06/12/2015 04:30 PM   HGB 13.1 09/19/2018 05:04 AM     Lipids: Lab Results  Component Value Date/Time   LDLCALC 92 02/16/2013 05:21 PM   CHOL 142 02/16/2013 05:21 PM   TRIG 72 02/16/2013 05:21 PM   HDL 36 (L) 02/16/2013 05:21 PM        ASSESSMENT AND PLAN:  1.  Cardiomyopathy: This was diagnosed by an echocardiogram performed on  09/18/2018, LVEF 45 to 50%, with diffuse hypokinesis.  She is euvolemic.  This may be due to the doxorubicin component of the R-CHOP regimen which she received for Hodgkin's lymphoma between 05/05/2016 through 08/19/2016.  Cardiomyopathy is likely nonischemic.  She is now on carvedilol.  I will add lisinopril as diastolic blood pressure is mildly elevated today.  I will reassess cardiac function in several months.  I have also recommended she comply with CPAP.  I will make a referral to a sleep specialist.  2.  Hypertension: Diastolic blood pressure is mildly elevated.  She is on carvedilol.  I will add lisinopril 5 mg daily given her cardiomyopathy.  CPAP compliance will also help to improve blood pressure.  3.  Obstructive sleep apnea: She is noncompliant with CPAP.  She has had witnessed apneic episodes recently.  This is likely contributing to hypertension and also to lack of improvement in LV systolic function.  I will make a referral to a sleep specialist.     Disposition: Follow up in 6 months  Signed: Kate Sable, M.D., F.A.C.C.  10/13/2018, 9:10 AM

## 2018-10-13 NOTE — Patient Instructions (Signed)
Medication Instructions:  Start lisinopril 5 mg once daily   Labwork: none  Testing/Procedures: None  Follow-Up: Your physician wants you to follow-up in: 6 months.  You will receive a reminder letter in the mail two months in advance. If you don't receive a letter, please call our office to schedule the follow-up appointment.   Any Other Special Instructions Will Be Listed Below (If Applicable).  You have been referred to [place], someone will contact you once insurance has approved your visit.       If you need a refill on your cardiac medications before your next appointment, please call your pharmacy.

## 2018-11-05 ENCOUNTER — Telehealth: Payer: Self-pay | Admitting: Cardiovascular Disease

## 2018-11-05 MED ORDER — LISINOPRIL 5 MG PO TABS: 5.0000 mg | ORAL_TABLET | Freq: Every day | ORAL | 3 refills | Status: DC

## 2018-11-05 NOTE — Telephone Encounter (Signed)
Spoke with pharmacist at [facility] - did not have on profile.  New rx given today.  Patient notified.

## 2018-11-05 NOTE — Telephone Encounter (Signed)
[name]'s pharmacy told patient that her lisinopril (PRINIVIL,ZESTRIL) 5 MG tablet   Has not been sent to them.

## 2018-11-15 ENCOUNTER — Encounter (HOSPITAL_COMMUNITY): Payer: Self-pay

## 2018-11-17 ENCOUNTER — Encounter (HOSPITAL_COMMUNITY): Payer: Self-pay | Admitting: *Deleted

## 2018-11-23 ENCOUNTER — Telehealth: Payer: Self-pay | Admitting: Neurology

## 2018-11-23 ENCOUNTER — Encounter: Payer: Self-pay | Admitting: Neurology

## 2018-11-23 ENCOUNTER — Other Ambulatory Visit (HOSPITAL_COMMUNITY): Payer: Self-pay | Admitting: Oncology

## 2018-11-23 NOTE — Telephone Encounter (Signed)
Pt returned call and I was able to review with her about the video visits and she agreed to complete this process. I have reviewed her chart and made sure everything is updated and ready for her upcoming apt. Pt's email is [email or web address]. Pt understands that the [employer] webex software must be downloaded and operational on the device pt plans to use for the visit. Patient verbalized understanding about completing the sleep scale and measuring her neck prior to the visit. Pt verbalized understanding.

## 2018-11-23 NOTE — Telephone Encounter (Signed)
Called the patient to inform them that our office has placed new protocols in place for our office visits. Due to the virus pandemic our office is reducing our number of office visits in order to minimize the risk to our patients and healthcare providers. Pt agreed she would be interested in doing this but asked that I called back at 11:30 to go over information.   I called the patient at 11:35 am and there was no answer and VM set up. Will send a mychart message to the patient

## 2018-12-01 ENCOUNTER — Encounter: Payer: Self-pay | Admitting: Neurology

## 2018-12-01 ENCOUNTER — Other Ambulatory Visit: Payer: Self-pay

## 2018-12-01 ENCOUNTER — Ambulatory Visit (INDEPENDENT_AMBULATORY_CARE_PROVIDER_SITE_OTHER): Payer: 59 | Admitting: Neurology

## 2018-12-01 DIAGNOSIS — C8100 Nodular lymphocyte predominant Hodgkin lymphoma, unspecified site: Secondary | ICD-10-CM

## 2018-12-01 DIAGNOSIS — G4733 Obstructive sleep apnea (adult) (pediatric): Secondary | ICD-10-CM

## 2018-12-01 DIAGNOSIS — I10 Essential (primary) hypertension: Secondary | ICD-10-CM

## 2018-12-01 DIAGNOSIS — R0683 Snoring: Secondary | ICD-10-CM

## 2018-12-01 DIAGNOSIS — R55 Syncope and collapse: Secondary | ICD-10-CM | POA: Diagnosis not present

## 2018-12-01 DIAGNOSIS — G4710 Hypersomnia, unspecified: Secondary | ICD-10-CM | POA: Diagnosis not present

## 2018-12-01 DIAGNOSIS — G4730 Sleep apnea, unspecified: Secondary | ICD-10-CM

## 2018-12-01 DIAGNOSIS — Z6841 Body Mass Index (BMI) 40.0 and over, adult: Secondary | ICD-10-CM

## 2018-12-01 NOTE — Progress Notes (Signed)
Virtual Visit via Video Note  I connected with [name] on 12/01/18 at 10:00 AM EDT by a video enabled telemedicine application and verified that I am speaking with the correct person using two identifiers.   I discussed the limitations of evaluation and management by telemedicine and the availability of in person appointments. The patient expressed understanding and agreed to proceed.  Larey Seat, MD   [facility]   Provider:  Larey Seat, MD   Primary Care Physician:  Orson Eva, DO  Referring Provider: Herminio Commons, MD      HPI:  [name] is a 33 y.o. female patient , seen here  in a referral from Dr. Bronson Ing for a video sleep consultation.   Chief complaint according to patient : 'I had a sleep study 5 years ago and soon after became severely ill, so at the time a CPAP was not a priority".  Mrs. [name]  D. [name] is a 33 year old married African-American patient , a mother of 2, who had undergone a sleep study approximately 5 years ago but soon after she received the results she was diagnosed with Hodgkin's lymphoma and was not able to follow through with OSA treatment.    She was hospitalized on 17 September 2018 at [facility], and was under the care of the hospitalist service.  She sustained 2 syncopal episodes the initial episode while sitting on the commode and urinating she felt that she never lost complete consciousness it was more of a lightheadedness and alteration of awareness.  2 hours later she was getting up from the dining room table to walk to her bedroom when she passed out.  There was no incontinence no tongue biting no witnessed tonic-clonic activity.  She had felt ill since the 27th 4 days prior and had a nonproductive cough she was also febrile up to 102 degrees from height and had some nasal congestion.  She was given IV fluids but she was not orthostatic when tested.  She later tested positive for influenza  type a and was prescribed oseltamivir.  The hospitalist nurses noted that the patient had significant apnea and was snoring loudly, she also had intermittent hypoxemia.   A chest x-ray showed no pulmonary infiltrates.  An EKG demonstrated sinus tachycardia.  To her surprise an echocardiogram revealed mildly reduced left ventricular systolic function, and the left ventricular ejection fraction was 45 to 50% global hypokinesis was noted, but no significant valvular pathology.  She had been on amlodipine and yet had developed ankle and foot edema as well as puffiness in her fingers and the dry cough may have been worse on this medication, amlodipine was therefore replaced with carvedilol. She followed up with dr Bronson Ing after discharge, his visit took place on 10-13-2018.  He noted that the patient was euvolemic and that she had this mild cardiomyopathy with restriction of the ejection fraction may be due to a chemotherapeutic side effect and she received treatment for Hodgkin lymphoma.  Hodgkin lymphoma chemotherapy was last given in January 2018.  He referred for a sleep study because the patient husband had  witnessed apnea and snoring and the patient was excessively daytime sleepy. Her risk facor of morbid obesity remains also.   Dr. Bronson Ing also hopes that her hypertension will be easier controlled if OSA is treated, and that she may find more restful and restorative sleep helping also with weight loss.    Sleep habits are as follows: The patient always works  from home but her current situation is altered as all her kids are at home as well.  Usually dinnertime was as late as 9  PM, her husband is a high school sports coach and both children participate in sports as well.  This delayed dinnertime.  Now dinnertime at 730 for bedtime is later however she goes to bed often after midnight and she starts to work later as well.  She describes her bedroom as cool, quiet and dark.  She has light blocking  curtains.  She prefers to sleep on her side but often wakes up with numbness and tingling in the affected arm and shoulder.  She has orthopnea and she states that she sleeps with 3 pillows.  3-4 times each night she has to go to the bathroom often she wakes up with a headache in the morning now she rises at 8:30 AM.  She feels that her sleep is restless and not refreshing restoring.  She often has a dry mouth.    Sleep medical history : The patient was diagnosed with Hodgkin's lymphoma, she received chemotherapy which concluded in January 2018, she was diagnosed with obstructive sleep apnea also not sure to which degree.  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  She has been diagnosed with low vitamin D and was prescribed 50,000 units once a week, she is taking potassium supplements, carvedilol, she does have a rescue inhaler for asthma.  She has a cervical lymphadenopathy as well as axillary lymph nodes and left groin lymph nodes last seen on the PET scan in July 2019.  She does have anxiety, hypertension.  She had a Port-A-Cath placement she had treatment for a bladder tumor, she had breast reconstruction surgery in December 2001.     Family history: The patient has no family history of early coronary artery disease, but she remembers that the paternal grandmother was excessively daytime sleepy, but a fall asleep in conversations.  She had obstructive sleep apnea.   Social history: The patient does not use tobacco of any kind and never has, she drinks 1 or 2 glasses of wine at night with dinner, she drinks orders caffeinated 2 a day tea in addition 3 to 4 glasses of iced tea but no coffee.  As I described above she is now working from home usually from 6 AM to 2:30 PM but now from about 9 AM to 5:30 PM her husband is a high school sports coach, her children are 87 year old son and a 15-year-old  daughter.  Review of Systems: Out of a complete 14 system review, the patient complains of only the following symptoms, and all other reviewed systems are negative. How likely are you to doze in the following situations: 0 = not likely, 1 = slight chance, 2 = moderate chance, 3 = high chance  Sitting and Reading? Watching Television? Sitting inactive in a public place (theater or meeting)? Lying down in the afternoon when circumstances permit? Sitting and talking to someone? Sitting quietly after lunch without alcohol? In a car, while stopped for a few minutes in traffic? As a passenger in a car for an hour without a break?  Total = [name] endorsed the Epworth sleepiness score at 16 points which is a high score.  She also endorsed a high degree of fatigue, of muscle aches, of joint pain, some hand numbness in the right hand some tingling most evident is arm or  hand numbness after she sleeps on the affected side.nocturia, loud snoring, apnea was witnessed.    Social History   Socioeconomic History   Marital status: Married    Spouse name: Not on file   Number of children: Not on file   Years of education: Not on file   Highest education level: Not on file  Occupational History   Not on file  Social Needs   Financial resource strain: Not hard at all   Food insecurity:    Worry: Never true    Inability: Never true   Transportation needs:    Medical: Not on file    Non-medical: Not on file  Tobacco Use   Smoking status: Never Smoker   Smokeless tobacco: Never Used  Substance and Sexual Activity   Alcohol use: Yes    Alcohol/week: 1.0 standard drinks    Types: 1 Glasses of wine per week   Drug use: No   Sexual activity: Yes    Birth control/protection: I.U.D.  Lifestyle   Physical activity:    Days per week: 4 days    Minutes per session: 120 min   Stress: Not at all  Relationships   Social connections:    Talks on phone: More than three times a  week    Gets together: More than three times a week    Attends religious service: More than 4 times per year    Active member of club or organization: Yes    Attends meetings of clubs or organizations: More than 4 times per year    Relationship status: Married   Intimate partner violence:    Fear of current or ex partner: No    Emotionally abused: No    Physically abused: No    Forced sexual activity: No  Other Topics Concern   Not on file  Social History Narrative   Not on file    Family History  Problem Relation Age of Onset   Hypertension Mother    Cancer Mother    Hypertension Father    Heart disease Maternal Grandmother    Diabetes Paternal Grandmother     Past Medical History:  Diagnosis Date   Anxiety    Asthma    had since childhood; has not had to use inhaler or nebulizer for 8-9 years. Been doing well.   Cervical lymphadenopathy    left groin lymph node, axillary lymph nodes:  seen on PET scan 02/2018   Dyspnea    Headache(784.0)    usually controlled c Tylenol when not pregnant, but more painful headaches  during preg. due to hypertension.   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 2011   Iron deficiency 01/13/2017   Lymphoma, Hodgkin's ([place])    Stage IV   Sleep apnea     Past Surgical History:  Procedure Laterality Date   ABDOMINAL HYSTERECTOMY  05/2017   ADENOIDECTOMY     age 33   BREAST RECONSTRUCTION  December 2001   breast reduction at age 81   FULGURATION OF BLADDER TUMOR N/A 07/23/2016   Procedure: CYSTOSCOPY, BIOPSY AND FULGURATION OF BLADDER;  Surgeon: Cleon Gustin, MD;  Location: [facility];  Service: Urology;  Laterality: N/A;   MASS EXCISION Right 04/07/2016   Procedure: EXCISION/BIOPSY SOFT TISSUE MASS RIGHT THIGH;  Surgeon: Aviva Signs, MD;  Location: [facility];  Service: General;  Laterality: Right;   port  a cath placed     PORTACATH PLACEMENT Left 04/28/2016   Procedure: INSERTION PORT-A-CATH;  Surgeon: Aviva Signs, MD;  Location: [facility];  Service: General;  Laterality: Left;    Current Outpatient Medications  Medication Sig Dispense Refill   carvedilol (COREG) 6.25 MG tablet Take 1 tablet (6.25 mg total) by mouth 2 (two) times daily with a meal. 60 tablet 1   lisinopril (PRINIVIL,ZESTRIL) 5 MG tablet Take 1 tablet (5 mg total) by mouth daily. 90 tablet 3   potassium chloride SA (K-DUR,KLOR-CON) 20 MEQ tablet Take 1 tablet (20 mEq total) by mouth daily. 30 tablet 0   Vitamin D, Ergocalciferol, (DRISDOL) 50000 units CAPS capsule Take 50,000 Units by mouth every Monday. Takes on Mondays.     No current facility-administered medications for this visit.     Allergies as of 12/01/2018   (No Known Allergies)    Vitals: LMP 02/02/2013  Last Weight:   Wt Readings from Last 1 Encounters:  10/13/18 282 lb (127.9 kg)   BMI is 48 kg/m2.    Last Height:   Ht Readings from Last 1 Encounters:  10/13/18 5\' 4"  (1.626 m)    Observations/Objective:  General: The patient is awake, alert and appears not in acute distress. The patient is well groomed. Head: Normocephalic, atraumatic. Neck is supple. Mallampati 4,  neck circumference:18. Nasal airflow patent,  Retrognathia is seen.  Trunk: BMI is 48. *   Neurologic exam : The patient is awake and alert, oriented to place and time.  Memory subjective described as intact.  Attention span & concentration ability appears normal. Speech is fluent,  without dysarthria, dysphonia or aphasia. Mood and affect are appropriate.  Cranial nerves: Pupils are equal  Facial motor strength is symmetric and tongue and uvula move midline. Shoulder shrug was symmetrical.   Motor exam: symmetric bulk and strength in all extremities. Sensory:  Reportedly numbness in left hand when typing, tingling- carpal tunnel?  Coordination: Rapid alternating movements in  the fingers/hands was normal. Finger-to-nose maneuver  normal without evidence of ataxia, dysmetria or tremor.  Gait and station: Patient walks without assistive device.  I reviewed the patient's latest labs, her hypokalemia has been resolved with potassium supplements, her cardiologist mentioned her vital signs during his visit with her 135/96.  Oxygen saturation seated was 98%, he noted no abnormalities of cardiac rhythm or rate and no murmur.  Also stated that there was no peripheral edema noted in his visit.    Assessment:    1) I like for the patient to undergo a home sleep test, given that she is a higher risk patient should she contract COVID 19.  Her history of chemotherapy, Hodgkin lymphoma, recent influenza and cardiomyopathy could lead to a fulminant course.  She also has hypertension and morbid obesity.  For this reason I will order a home sleep test by mail this will be after interpretation of data is completed, followed by either an auto CPAP order or a visit in the office showed a central kind of apnea be present or any other more complicating comorbidity be evident such as prolonged hypoxemia.  I also mentioned that she probably has carpal tunnel the way she describes the hand numbness and tingling and for this reason I would like for her to undergo an EMG and nerve conduction study in the future may be 3 months from now. She may also benefit from a medical weight management referral.  The patient was advised of the nature of the  diagnosed disorder , the treatment options and the  risks for general health and wellness arising from not treating the condition.   Plan:  HST ordered, may need o2 check given recent developments of hypoxemia, shortness of breath and cardiomyopathy diagnosis in hospital .    Follow Up Instructions:    I discussed the assessment and treatment plan with the patient. The patient was provided an opportunity to ask questions and all were answered. The  patient agreed with the plan and demonstrated an understanding of the instructions.   The patient was advised to call back or seek an in-person evaluation if the symptoms worsen or if the condition fails to improve as anticipated.  I provided 30 minutes of non-face-to-face time during this video-encounter. I prepared for this visit , reviewed her cardiology notes, her inpatient hospitalization record.     Larey Seat, MD 4/49/6759, 1:63 PM  Certified in Neurology by [employer]BP[employer] Certified in Sleep Medicine by [facility] [address], [place], [place] [address]  Larey Seat, MD

## 2018-12-02 ENCOUNTER — Ambulatory Visit (HOSPITAL_COMMUNITY)
Admission: RE | Admit: 2018-12-02 | Discharge: 2018-12-02 | Disposition: A | Discharge status: Home / Self Care | Payer: 59 | Source: Ambulatory Visit | Attending: Nurse Practitioner | Admitting: Nurse Practitioner

## 2018-12-02 ENCOUNTER — Inpatient Hospital Stay (HOSPITAL_COMMUNITY): Payer: 59 | Attending: Hematology

## 2018-12-02 ENCOUNTER — Other Ambulatory Visit: Payer: Self-pay

## 2018-12-02 DIAGNOSIS — Z87891 Personal history of nicotine dependence: Secondary | ICD-10-CM | POA: Diagnosis not present

## 2018-12-02 DIAGNOSIS — D509 Iron deficiency anemia, unspecified: Secondary | ICD-10-CM | POA: Insufficient documentation

## 2018-12-02 DIAGNOSIS — J45909 Unspecified asthma, uncomplicated: Secondary | ICD-10-CM | POA: Diagnosis not present

## 2018-12-02 DIAGNOSIS — C8100 Nodular lymphocyte predominant Hodgkin lymphoma, unspecified site: Secondary | ICD-10-CM

## 2018-12-02 DIAGNOSIS — L732 Hidradenitis suppurativa: Secondary | ICD-10-CM | POA: Diagnosis not present

## 2018-12-02 DIAGNOSIS — C8108 Nodular lymphocyte predominant Hodgkin lymphoma, lymph nodes of multiple sites: Secondary | ICD-10-CM | POA: Insufficient documentation

## 2018-12-02 DIAGNOSIS — Z79899 Other long term (current) drug therapy: Secondary | ICD-10-CM | POA: Diagnosis not present

## 2018-12-02 DIAGNOSIS — G4700 Insomnia, unspecified: Secondary | ICD-10-CM | POA: Insufficient documentation

## 2018-12-02 DIAGNOSIS — F419 Anxiety disorder, unspecified: Secondary | ICD-10-CM | POA: Diagnosis not present

## 2018-12-02 DIAGNOSIS — M7989 Other specified soft tissue disorders: Secondary | ICD-10-CM | POA: Diagnosis not present

## 2018-12-02 DIAGNOSIS — Z809 Family history of malignant neoplasm, unspecified: Secondary | ICD-10-CM | POA: Insufficient documentation

## 2018-12-02 LAB — COMPREHENSIVE METABOLIC PANEL
ALT: 14 U/L (ref 0–44)
AST: 13 U/L — ABNORMAL LOW (ref 15–41)
Albumin: 3.6 g/dL (ref 3.5–5.0)
Alkaline Phosphatase: 45 U/L (ref 38–126)
Anion gap: 7 (ref 5–15)
BUN: 9 mg/dL (ref 6–20)
CO2: 26 mmol/L (ref 22–32)
Calcium: 9.3 mg/dL (ref 8.9–10.3)
Chloride: 103 mmol/L (ref 98–111)
Creatinine, Ser: 0.74 mg/dL (ref 0.44–1.00)
GFR calc Af Amer: >60 mL/min (ref >60)
GFR calc non Af Amer: >60 mL/min (ref >60)
Glucose, Bld: 94 mg/dL (ref 70–99)
Potassium: 3.7 mmol/L (ref 3.5–5.1)
Sodium: 136 mmol/L (ref 135–145)
Total Bilirubin: 0.6 mg/dL (ref 0.3–1.2)
Total Protein: 7.5 g/dL (ref 6.5–8.1)

## 2018-12-02 LAB — CBC WITH DIFFERENTIAL/PLATELET
Abs Immature Granulocytes: 0.02 10*3/uL (ref 0.00–0.07)
Basophils Absolute: 0 10*3/uL (ref 0.0–0.1)
Basophils Relative: 1 %
Eosinophils Absolute: 0.3 10*3/uL (ref 0.0–0.5)
Eosinophils Relative: 4 %
HCT: 44 % (ref 36.0–46.0)
Hemoglobin: 13.7 g/dL (ref 12.0–15.0)
Immature Granulocytes: 0 %
Lymphocytes Relative: 21 %
Lymphs Abs: 1.6 10*3/uL (ref 0.7–4.0)
MCH: 26.7 pg (ref 26.0–34.0)
MCHC: 31.1 g/dL (ref 30.0–36.0)
MCV: 85.8 fL (ref 80.0–100.0)
Monocytes Absolute: 0.7 10*3/uL (ref 0.1–1.0)
Monocytes Relative: 8 %
Neutro Abs: 5.2 10*3/uL (ref 1.7–7.7)
Neutrophils Relative %: 66 %
Platelets: 419 10*3/uL — ABNORMAL HIGH (ref 150–400)
RBC: 5.13 MIL/uL — ABNORMAL HIGH (ref 3.87–5.11)
RDW: 13.5 % (ref 11.5–15.5)
WBC: 7.9 10*3/uL (ref 4.0–10.5)
nRBC: 0 % (ref 0.0–0.2)

## 2018-12-02 LAB — FERRITIN: Ferritin: 57 ng/mL (ref 11–307)

## 2018-12-02 LAB — IRON AND TIBC
Iron: 24 ug/dL — ABNORMAL LOW (ref 28–170)
Saturation Ratios: 6 % — ABNORMAL LOW (ref 10.4–31.8)
TIBC: 434 ug/dL (ref 250–450)
UIBC: 410 ug/dL

## 2018-12-02 LAB — LACTATE DEHYDROGENASE: LDH: 99 U/L (ref 98–192)

## 2018-12-02 LAB — FOLATE: Folate: 13.4 ng/mL (ref >5.9)

## 2018-12-02 LAB — VITAMIN B12: Vitamin B-12: 311 pg/mL (ref 180–914)

## 2018-12-02 MED ORDER — IOHEXOL 300 MG/ML  SOLN
100.0000 mL | Freq: Once | INTRAMUSCULAR | Status: AC | PRN
  Administered 2018-12-02: 100 mL via INTRAVENOUS

## 2018-12-07 ENCOUNTER — Inpatient Hospital Stay (HOSPITAL_BASED_OUTPATIENT_CLINIC_OR_DEPARTMENT_OTHER): Payer: 59 | Admitting: Hematology

## 2018-12-07 ENCOUNTER — Other Ambulatory Visit: Payer: Self-pay

## 2018-12-07 ENCOUNTER — Encounter (HOSPITAL_COMMUNITY): Payer: Self-pay | Admitting: Hematology

## 2018-12-07 VITALS — BP 134/57 | HR 104 | Temp 98.4°F | Resp 18 | Wt 281.6 lb

## 2018-12-07 DIAGNOSIS — C8108 Nodular lymphocyte predominant Hodgkin lymphoma, lymph nodes of multiple sites: Secondary | ICD-10-CM

## 2018-12-07 DIAGNOSIS — C8100 Nodular lymphocyte predominant Hodgkin lymphoma, unspecified site: Secondary | ICD-10-CM

## 2018-12-07 DIAGNOSIS — L732 Hidradenitis suppurativa: Secondary | ICD-10-CM

## 2018-12-07 DIAGNOSIS — J45909 Unspecified asthma, uncomplicated: Secondary | ICD-10-CM

## 2018-12-07 DIAGNOSIS — D509 Iron deficiency anemia, unspecified: Secondary | ICD-10-CM

## 2018-12-07 DIAGNOSIS — Z809 Family history of malignant neoplasm, unspecified: Secondary | ICD-10-CM

## 2018-12-07 DIAGNOSIS — G4700 Insomnia, unspecified: Secondary | ICD-10-CM

## 2018-12-07 DIAGNOSIS — Z79899 Other long term (current) drug therapy: Secondary | ICD-10-CM

## 2018-12-07 DIAGNOSIS — F419 Anxiety disorder, unspecified: Secondary | ICD-10-CM | POA: Diagnosis not present

## 2018-12-07 DIAGNOSIS — Z87891 Personal history of nicotine dependence: Secondary | ICD-10-CM

## 2018-12-07 DIAGNOSIS — M7989 Other specified soft tissue disorders: Secondary | ICD-10-CM

## 2018-12-07 NOTE — Progress Notes (Signed)
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  CURRENT THERAPY: Observation   BRIEF ONCOLOGIC HISTORY:    Hodgkin lymphoma, nodular lymphocyte predominance ([place])   04/07/2016 Procedure    Excisional lymph node biopsy of right thigh    04/11/2016 Pathology Results    Nodular lymphocyte predominant hodgkin Lymphoma    04/23/2016 Miscellaneous    Normal ESR, LDH    04/25/2016 PET scan    1. Hypermetabolic enlarged bilateral axillary, bilateral external iliac and bilateral inguinal lymph nodes consistent with lymphoma. 2. Hypermetabolic foci of skin thickening in the bilateral axilla and bilateral lower ventral chest wall, nonspecific, cannot exclude cutaneous lymphoma. 3. Hypermetabolism throughout Waldeyer's ring with associated mucosal thickening on the CT images, which could be inflammatory or due to lymphoma. 4. No splenic enlargement or hypermetabolism. 5. Diffuse hypermetabolism throughout the axial skeleton without discrete bone lesions on the CT images, worrisome for diffuse osseous involvement by lymphoma.    04/29/2016 Imaging    MUGA- Left ventricular ejection fraction equals 66 %.    05/05/2016 - 08/19/2016 Chemotherapy    The patient had DOXOrubicin (ADRIAMYCIN) chemo injection 116 mg, 50 mg/m2 = 116 mg, Intravenous,  Once, 3 of 6 cycles  palonosetron (ALOXI) injection 0.25 mg, 0.25 mg, Intravenous,  Once, 3 of 6 cycles  pegfilgrastim (NEULASTA ONPRO KIT) injection 6 mg, 6 mg, Subcutaneous, Once, 3 of 6 cycles  vinCRIStine (ONCOVIN) 2 mg in sodium chloride 0.9 % 50 mL chemo infusion, 2 mg, Intravenous,  Once, 3 of 6 cycles Dose modification: 1 mg (original dose 2 mg, Cycle 3, Reason: Dose not tolerated, Comment: neuropathy)  riTUXimab (RITUXAN) 900 mg in sodium  chloride 0.9 % 250 mL (2.6471 mg/mL) chemo infusion, 375 mg/m2 = 900 mg, Intravenous,  Once, 3 of 6 cycles  cyclophosphamide (CYTOXAN) 1,740 mg in sodium chloride 0.9 % 250 mL chemo infusion, 750 mg/m2 = 1,740 mg, Intravenous,  Once, 3 of 6 cycles  for chemotherapy treatment.      07/02/2016 Procedure    Flexible cystoscopy by Dr. Alyson Ingles (Urology) discovering a 1 cm lesion with clot attached on the posterior wall of bladder.    07/04/2016 PET scan    1. Interval improvement in hypermetabolic nodal activity within the axilla, pelvis and inguinal regions bilaterally consistent with positive response to therapy. Deauville stage III. 2. Interval improvement in the previous demonstrated multifocal subcutaneous activity. There is residual prominent activity in the anterior left perineal region which could be inflammatory.  There is no focal hypermetabolic activity to suggest osseous metastatic disease. Mildly prominent marrow activity is again noted throughout the axial and proximal appendicular skeleton.    09/08/2016 PET scan    No significant change in mild hypermetabolic lymphadenopathy in bilateral axillary, external iliac, and inguinal chains (Deauville score 3). No new or increased hypermetabolic lymphadenopathy identified .  Increased multifocal hypermetabolic subcutaneous foci within the chest, abdomen and pelvis. This is of uncertain etiology and clinical significance .    01/06/2017 PET scan    IMPRESSION: 1. Scattered mild adenopathy in the neck, chest, and pelvis with increased metabolic activity, along with newly hypermetabolic cutaneous lesions, primarily Deauville 4 and Deauville 5 activity as noted above. 2. Geographic hepatic steatosis.      CANCER STAGING: Cancer Staging No matching staging information was found  for the patient.   INTERVAL HISTORY:  [name] 33 y.o. female returns for routine follow-up. She is here today alone. She states that she has  noticed some swelling in her feet. She states that she continues to have numbness in her hands and feet. She states that she was hospitalized in February with the flu, strep throat and an ear infection. She states that she has been doing well since then. Denies any nausea, vomiting, or diarrhea. Denies any new pains. Had not noticed any recent bleeding such as epistaxis, hematuria or hematochezia. Denies recent chest pain on exertion, shortness of breath on minimal exertion, pre-syncopal episodes, or palpitations. Denies any recent fevers, or infections. Patient reports appetite at 100% and energy level at 75%.     REVIEW OF SYSTEMS:  Review of Systems  Cardiovascular: Positive for leg swelling.  Neurological: Positive for numbness.  Psychiatric/Behavioral: Positive for sleep disturbance.     PAST MEDICAL/SURGICAL HISTORY:  Past Medical History:  Diagnosis Date  . Anxiety   . Asthma    had since childhood; has not had to use inhaler or nebulizer for 8-9 years. Been doing well.  . Cervical lymphadenopathy    left groin lymph node, axillary lymph nodes:  seen on PET scan 02/2018  . Dyspnea   . Headache(784.0)    usually controlled c Tylenol when not pregnant, but more painful headaches  during preg. due to hypertension.  . Hidradenitis suppurativa   . Hodgkin lymphoma, nodular lymphocyte predominance ([place]) 04/15/2016  . Hypertension December 2011   Been on [place] since Dec. 2011  . Iron deficiency 01/13/2017  . Lymphoma, Hodgkin's ([place])    Stage IV  . Sleep apnea    Past Surgical History:  Procedure Laterality Date  . ABDOMINAL HYSTERECTOMY  05/2017  . ADENOIDECTOMY     age 1  . BREAST RECONSTRUCTION  December 2001   breast reduction at age 35  . FULGURATION OF BLADDER TUMOR N/A 07/23/2016   Procedure: CYSTOSCOPY, BIOPSY AND FULGURATION OF BLADDER;  Surgeon: Cleon Gustin, MD;  Location: [facility];  Service: Urology;  Laterality: N/A;  . MASS EXCISION Right 04/07/2016    Procedure: EXCISION/BIOPSY SOFT TISSUE MASS RIGHT THIGH;  Surgeon: Aviva Signs, MD;  Location: [facility];  Service: General;  Laterality: Right;  . port a cath placed    . PORTACATH PLACEMENT Left 04/28/2016   Procedure: INSERTION PORT-A-CATH;  Surgeon: Aviva Signs, MD;  Location: [facility];  Service: General;  Laterality: Left;     SOCIAL HISTORY:  Social History   Socioeconomic History  . Marital status: Married    Spouse name: Not on file  . Number of children: Not on file  . Years of education: Not on file  . Highest education level: Not on file  Occupational History  . Not on file  Social Needs  . Financial resource strain: Not hard at all  . Food insecurity:    Worry: Never true    Inability: Never true  . Transportation needs:    Medical: Not on file    Non-medical: Not on file  Tobacco Use  . Smoking status: Never Smoker  . Smokeless tobacco: Never Used  Substance and Sexual Activity  . Alcohol use: Yes    Alcohol/week: 1.0 standard drinks    Types: 1 Glasses of wine per week  . Drug use: No  . Sexual activity: Yes    Birth control/protection: I.U.D.  Lifestyle  . Physical activity:    Days per  week: 4 days    Minutes per session: 120 min  . Stress: Not at all  Relationships  . Social connections:    Talks on phone: More than three times a week    Gets together: More than three times a week    Attends religious service: More than 4 times per year    Active member of club or organization: Yes    Attends meetings of clubs or organizations: More than 4 times per year    Relationship status: Married  . Intimate partner violence:    Fear of current or ex partner: No    Emotionally abused: No    Physically abused: No    Forced sexual activity: No  Other Topics Concern  . Not on file  Social History Narrative  . Not on file    FAMILY HISTORY:  Family History  Problem Relation Age of Onset  . Hypertension Mother   . Cancer Mother   . Hypertension Father    . Heart disease Maternal Grandmother   . Diabetes Paternal Grandmother     CURRENT MEDICATIONS:  Outpatient Encounter Medications as of 12/07/2018  Medication Sig  . albuterol (VENTOLIN HFA) 108 (90 Base) MCG/ACT inhaler Inhale into the lungs.  . carvedilol (COREG) 6.25 MG tablet Take 1 tablet (6.25 mg total) by mouth 2 (two) times daily with a meal.  . lisinopril (PRINIVIL,ZESTRIL) 5 MG tablet Take 1 tablet (5 mg total) by mouth daily.  . potassium chloride SA (K-DUR,KLOR-CON) 20 MEQ tablet Take 1 tablet (20 mEq total) by mouth daily.  . Vitamin D, Ergocalciferol, (DRISDOL) 50000 units CAPS capsule Take 50,000 Units by mouth every Monday. Takes on Mondays.   No facility-administered encounter medications on file as of 12/07/2018.     ALLERGIES:  No Known Allergies   PHYSICAL EXAM:  ECOG Performance status: 0  Vitals:   12/07/18 0827  BP: (!) 134/57  Pulse: (!) 104  Resp: 18  Temp: 98.4 F (36.9 C)  SpO2: 98%   Filed Weights   12/07/18 0827  Weight: 281 lb 9.6 oz (127.7 kg)    Physical Exam Vitals signs reviewed.  Constitutional:      Appearance: Normal appearance.  Cardiovascular:     Rate and Rhythm: Normal rate and regular rhythm.     Heart sounds: Normal heart sounds.  Pulmonary:     Effort: Pulmonary effort is normal.     Breath sounds: Normal breath sounds.  Abdominal:     General: There is no distension.     Palpations: Abdomen is soft. There is no mass.  Musculoskeletal:        General: No swelling.  Skin:    General: Skin is warm.  Neurological:     General: No focal deficit present.     Mental Status: She is alert and oriented to person, place, and time.  Psychiatric:        Mood and Affect: Mood normal.        Behavior: Behavior normal.      LABORATORY DATA:  I have reviewed the labs as listed.  CBC    Component Value Date/Time   WBC 7.9 12/02/2018 0912   RBC 5.13 (H) 12/02/2018 0912   HGB 13.7 12/02/2018 0912   HCT 44.0 12/02/2018  0912   PLT 419 (H) 12/02/2018 0912   MCV 85.8 12/02/2018 0912   MCH 26.7 12/02/2018 0912   MCHC 31.1 12/02/2018 0912   RDW 13.5 12/02/2018 0912   LYMPHSABS 1.6  12/02/2018 0912   MONOABS 0.7 12/02/2018 0912   EOSABS 0.3 12/02/2018 0912   BASOSABS 0.0 12/02/2018 0912   CMP Latest Ref Rng & Units 12/02/2018 09/19/2018 09/18/2018  Glucose 70 - 99 mg/dL 94 93 101(H)  BUN 6 - 20 mg/dL '9 8 6  ' Creatinine 0.44 - 1.00 mg/dL 0.74 0.56 0.56  Sodium 135 - 145 mmol/L 136 133(L) 133(L)  Potassium 3.5 - 5.1 mmol/L 3.7 3.4(L) 3.4(L)  Chloride 98 - 111 mmol/L 103 102 104  CO2 22 - 32 mmol/L '26 24 23  ' Calcium 8.9 - 10.3 mg/dL 9.3 9.0 8.5(L)  Total Protein 6.5 - 8.1 g/dL 7.5 - -  Total Bilirubin 0.3 - 1.2 mg/dL 0.6 - -  Alkaline Phos 38 - 126 U/L 45 - -  AST 15 - 41 U/L 13(L) - -  ALT 0 - 44 U/L 14 - -       DIAGNOSTIC IMAGING:  I have independently reviewed the scans and discussed with the patient.   I have reviewed Venita Lick LPN's note and agree with the documentation.  I personally performed a face-to-face visit, made revisions and my assessment and plan is as follows.    ASSESSMENT & PLAN:   Hodgkin lymphoma, nodular lymphocyte predominance ([place]) 1.  Nodular lymphocyte predominant Hodgkin's lymphoma: - Presentation with right inguinal lymphadenopathy, excisional biopsy on 04/07/2016, positive for night sweats - PET/CT scan showed hypermetabolic enlarged bilateral axillary, bilateral external iliac, bilateral inguinal lymph nodes on 04/25/2016 -6 cycles of R-CHOP from 05/05/2016 through 08/19/2016 - She had some residual adenopathy at the end of chemotherapy which have appeared to have slightly increased on subsequent scans.  It was unclear if this was reactive adenopathy from her hidradenitis versus recurrent lymphoma.  Her last PET CT scan was done in August 2018 showed focal uptake in the left inguinal skin surface, likely related to inflammation. - I have reviewed the results of the PET CT  scan which showed hypermetabolic right axillary lymph node measuring 1.8 cm, previously 1.2 cm with SUV of 11, previously 6.2.  No abdominal adenopathy or organ involvement.  Hypermetabolic left groin and external iliac lymph nodes were more or less stable. - She had an episode of syncope recently and was hospitalized.  CT of the brain was negative. - She does not report any fevers, night sweats or weight loss in the last few months. -We reviewed results of the CT of the CAP from 12/02/2018.  There is unchanged bilateral axillary, iliac and inguinal adenopathy. -We have also reviewed her blood work which is grossly unchanged. -I have recommended a follow-up visit in 6 months with repeat blood work.  We will also repeat CT scan at that time as she has pre-existing adenopathy.      Orders placed this encounter:  Orders Placed This Encounter  Procedures  . CT Chest W Contrast  . CT Abdomen Pelvis W Contrast  . CBC with Differential/Platelet  . Comprehensive metabolic panel  . Sedimentation rate  . Lactate dehydrogenase      Derek Jack, MD [place] [phone]

## 2018-12-07 NOTE — Patient Instructions (Addendum)
[facility] at [facility] Discharge Instructions  You were seen today by Dr. Katragadda. He went over your recent lab results. He will see you back in 6 months for labs and follow up.   Thank you for choosing [facility] at [facility] to provide your oncology and hematology care.  To afford each patient quality time with our provider, please arrive at least 15 minutes before your scheduled appointment time.   If you have a lab appointment with the Cancer Center please come in thru the  Main Entrance and check in at the main information desk  You need to re-schedule your appointment should you arrive 10 or more minutes late.  We strive to give you quality time with our providers, and arriving late affects you and other patients whose appointments are after yours.  Also, if you no show three or more times for appointments you may be dismissed from the clinic at the providers discretion.     Again, thank you for choosing [facility].  Our hope is that these requests will decrease the amount of time that you wait before being seen by our physicians.       _____________________________________________________________  Should you have questions after your visit to [facility], please contact our office at [phone] between the hours of 8:00 a.m. and 4:30 p.m.  Voicemails left after 4:00 p.m. will not be returned until the following business day.  For prescription refill requests, have your pharmacy contact our office and allow 72 hours.    Cancer Center Support Programs:   > Cancer Support Group  2nd Tuesday of the month 1pm-2pm, Journey Room    

## 2018-12-07 NOTE — Assessment & Plan Note (Signed)
1.  Nodular lymphocyte predominant Hodgkin's lymphoma: - Presentation with right inguinal lymphadenopathy, excisional biopsy on 04/07/2016, positive for night sweats - PET/CT scan showed hypermetabolic enlarged bilateral axillary, bilateral external iliac, bilateral inguinal lymph nodes on 04/25/2016 -6 cycles of R-CHOP from 05/05/2016 through 08/19/2016 - She had some residual adenopathy at the end of chemotherapy which have appeared to have slightly increased on subsequent scans.  It was unclear if this was reactive adenopathy from her hidradenitis versus recurrent lymphoma.  Her last PET CT scan was done in August 2018 showed focal uptake in the left inguinal skin surface, likely related to inflammation. - I have reviewed the results of the PET CT scan which showed hypermetabolic right axillary lymph node measuring 1.8 cm, previously 1.2 cm with SUV of 11, previously 6.2.  No abdominal adenopathy or organ involvement.  Hypermetabolic left groin and external iliac lymph nodes were more or less stable. - She had an episode of syncope recently and was hospitalized.  CT of the brain was negative. - She does not report any fevers, night sweats or weight loss in the last few months. -We reviewed results of the CT of the CAP from 12/02/2018.  There is unchanged bilateral axillary, iliac and inguinal adenopathy. -We have also reviewed her blood work which is grossly unchanged. -I have recommended a follow-up visit in 6 months with repeat blood work.  We will also repeat CT scan at that time as she has pre-existing adenopathy.

## 2018-12-16 ENCOUNTER — Ambulatory Visit: Payer: 59 | Admitting: Neurology

## 2018-12-16 ENCOUNTER — Other Ambulatory Visit: Payer: Self-pay

## 2018-12-16 DIAGNOSIS — G4710 Hypersomnia, unspecified: Secondary | ICD-10-CM

## 2018-12-16 DIAGNOSIS — G4730 Sleep apnea, unspecified: Secondary | ICD-10-CM

## 2018-12-16 DIAGNOSIS — C8100 Nodular lymphocyte predominant Hodgkin lymphoma, unspecified site: Secondary | ICD-10-CM

## 2018-12-16 DIAGNOSIS — G4733 Obstructive sleep apnea (adult) (pediatric): Secondary | ICD-10-CM

## 2018-12-16 DIAGNOSIS — Z6841 Body Mass Index (BMI) 40.0 and over, adult: Secondary | ICD-10-CM

## 2018-12-16 DIAGNOSIS — R55 Syncope and collapse: Secondary | ICD-10-CM

## 2018-12-16 DIAGNOSIS — I10 Essential (primary) hypertension: Principal | ICD-10-CM

## 2018-12-16 DIAGNOSIS — R0683 Snoring: Secondary | ICD-10-CM

## 2019-03-09 ENCOUNTER — Ambulatory Visit (INDEPENDENT_AMBULATORY_CARE_PROVIDER_SITE_OTHER): Payer: 59 | Admitting: Neurology

## 2019-03-09 DIAGNOSIS — G4733 Obstructive sleep apnea (adult) (pediatric): Secondary | ICD-10-CM | POA: Diagnosis not present

## 2019-03-09 DIAGNOSIS — G4730 Sleep apnea, unspecified: Secondary | ICD-10-CM

## 2019-03-09 DIAGNOSIS — G4731 Primary central sleep apnea: Secondary | ICD-10-CM

## 2019-03-17 ENCOUNTER — Telehealth: Payer: Self-pay | Admitting: Neurology

## 2019-03-17 ENCOUNTER — Other Ambulatory Visit: Payer: Self-pay | Admitting: Neurology

## 2019-03-17 DIAGNOSIS — E611 Iron deficiency: Secondary | ICD-10-CM

## 2019-03-17 DIAGNOSIS — O1410 Severe pre-eclampsia, unspecified trimester: Secondary | ICD-10-CM

## 2019-03-17 DIAGNOSIS — C8100 Nodular lymphocyte predominant Hodgkin lymphoma, unspecified site: Secondary | ICD-10-CM

## 2019-03-17 DIAGNOSIS — E66813 Obesity, class 3: Secondary | ICD-10-CM

## 2019-03-17 DIAGNOSIS — R590 Localized enlarged lymph nodes: Secondary | ICD-10-CM

## 2019-03-17 DIAGNOSIS — G4739 Other sleep apnea: Secondary | ICD-10-CM

## 2019-03-17 DIAGNOSIS — G4731 Primary central sleep apnea: Secondary | ICD-10-CM

## 2019-03-17 DIAGNOSIS — R55 Syncope and collapse: Secondary | ICD-10-CM

## 2019-03-17 NOTE — Telephone Encounter (Signed)
-----   Message from Larey Seat, MD sent at 03/17/2019  1:01 PM EDT ----- Mrs. [name] is a 33 year old married African-American patient and mother of 2, and seen in a video consultation on 12-01-2018.   Summary & Diagnosis:   This HST showed an AHI of 90.5/h (!) and worse in NREM sleep than  in REM sleep. This is concerning for a central sleep apnea or  complex apnea. Further noted were tachycardia and 30 minutes of  hypoxemia time. Hypoxia related to REM sleep. A positional  dependent apnea was not found.   Recommendations:    Return for attended PAP therapy initiation- the severity of  apnea, the medical history, and the possible central component  make an attended sleep study titration the first choice.

## 2019-03-17 NOTE — Telephone Encounter (Signed)
I called pt. I advised pt that Dr. Brett Fairy reviewed their sleep study results and found that patient had severe sleep apnea and recommends that pt be treated with a cpap. Dr. Brett Fairy recommends that pt return for a repeat sleep study in order to properly titrate the cpap and ensure a good mask fit. Pt is agreeable to returning for a titration study. I advised pt that our sleep lab will file with pt's insurance and call pt to schedule the sleep study when we hear back from the pt's insurance regarding coverage of this sleep study. Pt verbalized understanding of results. Pt had no questions at this time but was encouraged to call back if questions arise.

## 2019-03-17 NOTE — Procedures (Addendum)
Patient Information     First Name: [name] Last Name: Asja [name]: [id number]  Birth Date: 05-Sep-1985 Age: 33 Gender: Female  Referring Provider: Kate Sable, MD BMI: 48.2 (W=282 lb, H=5' 4'')  Neck Circ.:  18 '' Epworth:  16  Sleep Study Information    Study Date: Mar 09, 2019 S/H/A Version: 001.001.001.001 / 4.0.1515 / 63  History:     Mrs. [name] is a 33 year old married African-American patient and mother of 2, who had undergone a sleep study approximately 5 years ago but soon after she received the results she was diagnosed with Hodgkin's lymphoma and was not able to follow through with OSA treatment.  She was hospitalized on 17 September 2018 at [facility], and was under the care of the hospitalist service.  She sustained 2 syncopal episodes; the initial episode while sitting on the commode and urinating. She felt that she never lost complete consciousness it was more of a lightheadedness and alteration of awareness.  2 hours later she was getting up from the dining room table to walk to her bedroom when she passed out.  There was no incontinence, no tongue biting, and no witnessed tonic-clonic activity.  She had felt ill since the 27th Jan., 4 days prior, and had a nonproductive cough. She became febrile up to 102 degrees and had some nasal congestion.  She was given IV fluids and she was not orthostatic when tested.  She later tested positive for influenza type A. The hospitalist and nurses noted that the patient had significant apnea and was snoring loudly, she also had intermittent hypoxemia. Summary & Diagnosis:    This HST showed an AHI of 90.5/h (!) and worse in NREM sleep than in REM sleep. This is concerning for a central sleep apnea or complex apnea. Further noted were tachycardia and 30 minutes of hypoxemia time. Hypoxia related to REM sleep. A positional dependent apnea was not found.   Recommendations:     Return for attended PAP therapy initiation- the  severity of apnea, the medical history, and the possible central component make an attended sleep study titration the first choice.                           Sleep Summary  Oxygen Saturation Statistics   Start Study Time: End Study Time: Total Recording Time:  10:45:43PM 6:47:11 AM 8 h, 1 min  Total Sleep Time % REM of Sleep Time:  7 h, 2 min 25.2    Mean: 92 Minimum: 77 Maximum: 100  Mean of Desaturations Nadirs (%):   89  Oxygen Desat. %:   4-9 10-20 >20 Total  Events Number Total   306  89 77.5 22.5  0 0.0  395 100.0  Oxygen Saturation: <90 <=88 <85 <80 <70  Duration (minutes): Sleep % 48.2 11.4 29.2 2.8 6.9 0.7 0.2 0.1 0.0 0.0     Respiratory Indices      Total Events REM NREM All Night  pRDI:  472  pAHI:  472 ODI:  395  pAHIc:  25  % CSR: 0.0 80.2 80.2 75.2 6.2 92.5 92.5 75.9 4.5 90.5 90.5 75.8 4.8       Pulse Rate Statistics during Sleep (BPM)      Mean: 103 Minimum: 71 Maximum: 132    Indices are calculated using technically valid sleep time of 5 h, 12 min. pRDI/pAHI are calculated using 02 desaturations ? 3% Body Position  Statistics  Position Supine Prone Right Left Non-Supine  Sleep (min) 148.0 45.0 136.5 93.5 275.0  Sleep % 35.0 10.6 32.3 22.1 65.0  pRDI 94.9 81.7 95.5 78.0 87.9  pAHI 94.9 81.7 95.5 78.0 87.9  ODI 75.3 81.7 79.5 69.8 76.0     Snoring Statistics Snoring Level (dB) >40 >50 >60 >70 >80 >Threshold (45)  Sleep (min) 403.7 84.8 3.5 0.0 0.0 212.0  Sleep % 95.4 20.0 0.8 0.0 0.0 50.1    Mean Sit  Body Position Statistics Sleep Stages Chart                                                                             pAHI=90.5                                                                                               Mild              Moderate                    Severe                                                    5              15                    30

## 2019-05-03 ENCOUNTER — Other Ambulatory Visit: Payer: Self-pay

## 2019-05-03 ENCOUNTER — Other Ambulatory Visit (HOSPITAL_COMMUNITY)
Admission: RE | Admit: 2019-05-03 | Discharge: 2019-05-03 | Disposition: A | Discharge status: Home / Self Care | Payer: 59 | Source: Ambulatory Visit | Attending: Neurology | Admitting: Neurology

## 2019-05-03 DIAGNOSIS — Z01812 Encounter for preprocedural laboratory examination: Secondary | ICD-10-CM | POA: Diagnosis present

## 2019-05-03 DIAGNOSIS — Z20828 Contact with and (suspected) exposure to other viral communicable diseases: Secondary | ICD-10-CM | POA: Insufficient documentation

## 2019-05-03 LAB — SARS CORONAVIRUS 2 (TAT 6-24 HRS): SARS Coronavirus 2: NEGATIVE

## 2019-05-05 ENCOUNTER — Ambulatory Visit (INDEPENDENT_AMBULATORY_CARE_PROVIDER_SITE_OTHER): Payer: 59 | Admitting: Neurology

## 2019-05-05 DIAGNOSIS — R55 Syncope and collapse: Secondary | ICD-10-CM

## 2019-05-05 DIAGNOSIS — R590 Localized enlarged lymph nodes: Secondary | ICD-10-CM

## 2019-05-05 DIAGNOSIS — G4731 Primary central sleep apnea: Secondary | ICD-10-CM

## 2019-05-05 DIAGNOSIS — G4730 Sleep apnea, unspecified: Secondary | ICD-10-CM

## 2019-05-05 DIAGNOSIS — C8100 Nodular lymphocyte predominant Hodgkin lymphoma, unspecified site: Secondary | ICD-10-CM

## 2019-05-10 DIAGNOSIS — G4730 Sleep apnea, unspecified: Secondary | ICD-10-CM | POA: Insufficient documentation

## 2019-05-10 DIAGNOSIS — G4731 Primary central sleep apnea: Secondary | ICD-10-CM | POA: Insufficient documentation

## 2019-05-10 NOTE — Procedures (Signed)
PATIENT'S NAME:  [name], [name] DOB:      [birth year]      MR#:    [id number]     DATE OF RECORDING: 05/05/2019 REFERRING M.D.:  Kate Sable, MD Study Performed:   Titration to Positive Airway Pressure  HISTORY:  This 33 year old African-American patient returns for a PAP titration after her HST rom 03-09-2019 showed an AHI of 90.5/h (!) and a worsening of AHI in NREM sleep rather than in REM sleep. This is concerning for a central sleep apnea or complex apnea. Further noted were tachycardia and 30 minutes of hypoxemia time. Hypoxia related to REM sleep. A positional dependent apnea was not found.  Return for attended PAP therapy initiation- the severity of apnea, the medical history, and the possible central component make an attended sleep study titration the first choice.    The patient endorsed the Epworth Sleepiness Scale at 16/24 points.   The patient's weight 281 pounds with a height of 64 (inches), resulting in a BMI of 47.8 kg/m2. The patient's neck circumference measured 18 inches.  CURRENT MEDICATIONS: Coreg, Prinivil, K-dur, Drisdol.   PROCEDURE:  This is a multichannel digital polysomnogram utilizing the SomnoStar 11.2 system.  Electrodes and sensors were applied and monitored per AASM Specifications.   EEG, EOG, Chin and Limb EMG, were sampled at 200 Hz.  ECG, Snore and Nasal Pressure, Thermal Airflow, Respiratory Effort, CPAP Flow and Pressure, Oximetry was sampled at 50 Hz. Digital video and audio were recorded.      CPAP was initiated at 5 cmH20 with heated humidity per AASM split night standards and pressure was advanced to 14 cmH20 because of ongoing hypopneas, apneas and desaturations. At none of the CPAP pressures was there a resolution of apnea noted. The technical changed to BIPAP and initiated treatment at 14/10 cm water, advancing to a BIPAP pressure of 17/14 cmH20. At this final BiPAP pressure there was still an AHI to 7.7 without further improvement of sleep  apnea. The best result was a BiPAP setting of 15/10 with an AHI of 2.6/h, nadir 02 at 91% and 23 minutes of sleep time.   Lights Out was at 00:03 and Lights On at 06:43. Total recording time (TRT) was 400.5 minutes, with a total sleep time (TST) of 270.5 minutes. The patient's sleep latency was 98 minutes. REM latency was 103.5 minutes.  The sleep efficiency was 67.5 %.    SLEEP ARCHITECTURE: WASO time (Wake after sleep onset) was 42.5 minutes.  There were 4.5 minutes in Stage N1, 157 minutes Stage N2, 50.5 minutes Stage N3 and 58.5 minutes in Stage REM.  The percentage of Stage N1 was 1.7%, Stage N2 was 58.%, Stage N3 was 18.7% and Stage R (REM sleep) was 21.6%.   RESPIRATORY ANALYSIS:  There was a total of 114 respiratory events: 6 obstructive apneas, 0 central apneas and 0 mixed apneas with 108 hypopneas.  The total APNEA/HYPOPNEA INDEX (AHI) was 25.3 /hour .  11 events occurred in REM sleep and 103 events in NREM. The REM AHI was 11.3 /hour versus a non-REM AHI of 29.2 /hour.  The patient spent 159.5 minutes of total sleep time in the supine position and 111 minutes in non-supine. The supine AHI was 35.7/h, versus a non-supine AHI of 10.3/h.  OXYGEN SATURATION & C02:  The baseline 02 saturation was 96%, with the lowest being 82%. Time spent below 89% saturation equaled 75 minutes. Hypoxemia resolved under BiPAP.   The arousals were noted as: 46  were spontaneous, 0 were associated with PLMs, and 14 were associated with respiratory events. The patient had a total of 0 Periodic Limb Movements.   Audio and video analysis did not show any abnormal or unusual movements, behaviors, phonations or vocalizations. The patient took one bathroom breaks. Snoring was noted. EKG was in keeping with normal sinus rhythm (NSR). The patient was given a medium sized [employer], a full face mask.  DIAGNOSIS 1. Severe Obstructive Sleep Apnea did not respond to CPAP, and only partially to BiPAP titration.   2. Sleep Related Hypoxemia was controlled under BiPAP. 3. No central apnea arose under BiPAP therapy.    PLANS/RECOMMENDATIONS: Start with15/10 cm water BiPAP therapy with heated humidification. 1. The patient can chose a mask of her comfort- the interface used in the sleep laboratory was a medium sized FFM by [employer], the [employer].    DISCUSSION: Revisit with NP in 2-3 month for compliance with BiPAP and troubleshooting.   A follow up appointment will be scheduled in the Sleep Clinic at [facility].   Please call [phone] with any questions.      I certify that I have reviewed the entire raw data recording prior to the issuance of this report in accordance with the Standards of Accreditation of the American Academy of Sleep Medicine (AASM)  Larey Seat, M.D.  05-10-2019 Diplomat, American Board of Psychiatry and Neurology  Diplomat, [place] of Sleep Medicine Medical Director, [place] [facility] at [facility]

## 2019-05-10 NOTE — Addendum Note (Signed)
Addended by: Larey Seat on: 05/10/2019 04:51 PM   Modules accepted: Orders

## 2019-05-11 ENCOUNTER — Telehealth: Payer: Self-pay | Admitting: Neurology

## 2019-05-11 NOTE — Telephone Encounter (Signed)
-----   Message from Larey Seat, MD sent at 05/10/2019  4:50 PM EDT ----- DIAGNOSIS  1. Severe Complex/ mostly Obstructive Sleep Apnea did not respond to CPAP, and  only partially to BiPAP titration.  2. Sleep Related Hypoxemia was controlled under BiPAP.  3. No central apnea arose under BiPAP therapy.    PLANS/RECOMMENDATIONS: Start with15/10 cm water BiPAP therapy  with heated humidification.  1. The patient can chose a mask of her comfort- the interface  used in the sleep laboratory was a medium sized FFM by [employer],  the [employer].   DISCUSSION: Revisit with NP in 2-3 month for compliance with  BiPAP and troubleshooting.

## 2019-05-11 NOTE — Telephone Encounter (Signed)
I called pt. I advised pt that Dr. Brett Fairy reviewed their sleep study results and found that pt was best tolerated on a BiPAP machine. Dr. Brett Fairy recommends that pt starts BiPAP. I reviewed PAP compliance expectations with the pt. Pt is agreeable to starting a CPAP. I advised pt that an order will be sent to a DME, [facility], and [employer] will call the pt within about one week after they file with the pt's insurance. [place] will show the pt how to use the machine, fit for masks, and troubleshoot the CPAP if needed. A follow up appt was made for insurance purposes with Ward Givens, NP on Dec 17,2020 at 10. Pt verbalized understanding to arrive 15 minutes early and bring their CPAP. A letter with all of this information in it will be mailed to the pt as a reminder. I verified with the pt that the address we have on file is correct. Pt verbalized understanding of results. Pt had no questions at this time but was encouraged to call back if questions arise. I have sent the order to [employer] and have received confirmation that they have received the order. Pt will need to bring the machine to DME prior to apt for a download to be faxed and sent to [place].

## 2019-05-30 ENCOUNTER — Inpatient Hospital Stay (HOSPITAL_COMMUNITY): Payer: 59 | Attending: Hematology

## 2019-05-30 ENCOUNTER — Other Ambulatory Visit: Payer: Self-pay

## 2019-05-30 DIAGNOSIS — C8105 Nodular lymphocyte predominant Hodgkin lymphoma, lymph nodes of inguinal region and lower limb: Secondary | ICD-10-CM | POA: Insufficient documentation

## 2019-05-30 DIAGNOSIS — C8100 Nodular lymphocyte predominant Hodgkin lymphoma, unspecified site: Secondary | ICD-10-CM

## 2019-05-30 LAB — CBC WITH DIFFERENTIAL/PLATELET
Abs Immature Granulocytes: 0.03 10*3/uL (ref 0.00–0.07)
Basophils Absolute: 0 10*3/uL (ref 0.0–0.1)
Basophils Relative: 0 %
Eosinophils Absolute: 0.3 10*3/uL (ref 0.0–0.5)
Eosinophils Relative: 3 %
HCT: 46.3 % — ABNORMAL HIGH (ref 36.0–46.0)
Hemoglobin: 14 g/dL (ref 12.0–15.0)
Immature Granulocytes: 0 %
Lymphocytes Relative: 24 %
Lymphs Abs: 2.1 10*3/uL (ref 0.7–4.0)
MCH: 25.6 pg — ABNORMAL LOW (ref 26.0–34.0)
MCHC: 30.2 g/dL (ref 30.0–36.0)
MCV: 84.8 fL (ref 80.0–100.0)
Monocytes Absolute: 0.7 10*3/uL (ref 0.1–1.0)
Monocytes Relative: 7 %
Neutro Abs: 5.9 10*3/uL (ref 1.7–7.7)
Neutrophils Relative %: 66 %
Platelets: 430 10*3/uL — ABNORMAL HIGH (ref 150–400)
RBC: 5.46 MIL/uL — ABNORMAL HIGH (ref 3.87–5.11)
RDW: 14 % (ref 11.5–15.5)
WBC: 9.1 10*3/uL (ref 4.0–10.5)
nRBC: 0 % (ref 0.0–0.2)

## 2019-05-30 LAB — COMPREHENSIVE METABOLIC PANEL
ALT: 18 U/L (ref 0–44)
AST: 16 U/L (ref 15–41)
Albumin: 3.4 g/dL — ABNORMAL LOW (ref 3.5–5.0)
Alkaline Phosphatase: 39 U/L (ref 38–126)
Anion gap: 8 (ref 5–15)
BUN: 10 mg/dL (ref 6–20)
CO2: 29 mmol/L (ref 22–32)
Calcium: 9.2 mg/dL (ref 8.9–10.3)
Chloride: 101 mmol/L (ref 98–111)
Creatinine, Ser: 0.85 mg/dL (ref 0.44–1.00)
GFR calc Af Amer: >60 mL/min (ref >60)
GFR calc non Af Amer: >60 mL/min (ref >60)
Glucose, Bld: 100 mg/dL — ABNORMAL HIGH (ref 70–99)
Potassium: 3.9 mmol/L (ref 3.5–5.1)
Sodium: 138 mmol/L (ref 135–145)
Total Bilirubin: 0.5 mg/dL (ref 0.3–1.2)
Total Protein: 7.2 g/dL (ref 6.5–8.1)

## 2019-05-30 LAB — SEDIMENTATION RATE: Sed Rate: 2 mm/hr (ref 0–22)

## 2019-05-30 LAB — LACTATE DEHYDROGENASE: LDH: 120 U/L (ref 98–192)

## 2019-06-06 ENCOUNTER — Ambulatory Visit (HOSPITAL_COMMUNITY): Payer: 59

## 2019-06-08 ENCOUNTER — Ambulatory Visit (HOSPITAL_COMMUNITY): Payer: 59 | Admitting: Hematology

## 2019-06-24 ENCOUNTER — Ambulatory Visit (HOSPITAL_COMMUNITY)
Admission: RE | Admit: 2019-06-24 | Discharge: 2019-06-24 | Disposition: A | Discharge status: Home / Self Care | Payer: 59 | Source: Ambulatory Visit | Attending: Hematology | Admitting: Hematology

## 2019-06-24 ENCOUNTER — Other Ambulatory Visit: Payer: Self-pay

## 2019-06-24 DIAGNOSIS — C8100 Nodular lymphocyte predominant Hodgkin lymphoma, unspecified site: Secondary | ICD-10-CM | POA: Diagnosis not present

## 2019-06-24 MED ORDER — IOHEXOL 300 MG/ML  SOLN
100.0000 mL | Freq: Once | INTRAMUSCULAR | Status: AC | PRN
  Administered 2019-06-24: 100 mL via INTRAVENOUS

## 2019-06-28 ENCOUNTER — Inpatient Hospital Stay (HOSPITAL_COMMUNITY): Payer: 59 | Attending: Hematology | Admitting: Hematology

## 2019-06-28 ENCOUNTER — Other Ambulatory Visit: Payer: Self-pay

## 2019-06-28 ENCOUNTER — Encounter (HOSPITAL_COMMUNITY): Payer: Self-pay | Admitting: Hematology

## 2019-06-28 VITALS — BP 142/107 | HR 111 | Temp 97.7°F | Resp 20 | Wt 291.0 lb

## 2019-06-28 DIAGNOSIS — Z79899 Other long term (current) drug therapy: Secondary | ICD-10-CM | POA: Insufficient documentation

## 2019-06-28 DIAGNOSIS — I10 Essential (primary) hypertension: Secondary | ICD-10-CM | POA: Insufficient documentation

## 2019-06-28 DIAGNOSIS — Z809 Family history of malignant neoplasm, unspecified: Secondary | ICD-10-CM | POA: Diagnosis not present

## 2019-06-28 DIAGNOSIS — Z8249 Family history of ischemic heart disease and other diseases of the circulatory system: Secondary | ICD-10-CM | POA: Insufficient documentation

## 2019-06-28 DIAGNOSIS — Z9221 Personal history of antineoplastic chemotherapy: Secondary | ICD-10-CM | POA: Diagnosis not present

## 2019-06-28 DIAGNOSIS — C8105 Nodular lymphocyte predominant Hodgkin lymphoma, lymph nodes of inguinal region and lower limb: Secondary | ICD-10-CM | POA: Diagnosis not present

## 2019-06-28 DIAGNOSIS — Z90710 Acquired absence of both cervix and uterus: Secondary | ICD-10-CM | POA: Insufficient documentation

## 2019-06-28 DIAGNOSIS — C8100 Nodular lymphocyte predominant Hodgkin lymphoma, unspecified site: Secondary | ICD-10-CM

## 2019-06-28 NOTE — Patient Instructions (Signed)
[facility] at [facility] Discharge Instructions  You were seen today by Dr. Katragadda. He went over your recent lab results. He will see you back in 6 months for labs, scans and follow up.   Thank you for choosing [facility] at [facility] to provide your oncology and hematology care.  To afford each patient quality time with our provider, please arrive at least 15 minutes before your scheduled appointment time.   If you have a lab appointment with the Cancer Center please come in thru the  Main Entrance and check in at the main information desk  You need to re-schedule your appointment should you arrive 10 or more minutes late.  We strive to give you quality time with our providers, and arriving late affects you and other patients whose appointments are after yours.  Also, if you no show three or more times for appointments you may be dismissed from the clinic at the providers discretion.     Again, thank you for choosing [facility].  Our hope is that these requests will decrease the amount of time that you wait before being seen by our physicians.       _____________________________________________________________  Should you have questions after your visit to [facility], please contact our office at [phone] between the hours of 8:00 a.m. and 4:30 p.m.  Voicemails left after 4:00 p.m. will not be returned until the following business day.  For prescription refill requests, have your pharmacy contact our office and allow 72 hours.    Cancer Center Support Programs:   > Cancer Support Group  2nd Tuesday of the month 1pm-2pm, Journey Room    

## 2019-06-28 NOTE — Progress Notes (Signed)
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  CURRENT THERAPY: Observation   BRIEF ONCOLOGIC HISTORY:  Oncology History  Hodgkin lymphoma, nodular lymphocyte predominance ([place])  04/07/2016 Procedure   Excisional lymph node biopsy of right thigh   04/11/2016 Pathology Results   Nodular lymphocyte predominant hodgkin Lymphoma   04/23/2016 Miscellaneous   Normal ESR, LDH   04/25/2016 PET scan   1. Hypermetabolic enlarged bilateral axillary, bilateral external iliac and bilateral inguinal lymph nodes consistent with lymphoma. 2. Hypermetabolic foci of skin thickening in the bilateral axilla and bilateral lower ventral chest wall, nonspecific, cannot exclude cutaneous lymphoma. 3. Hypermetabolism throughout Waldeyer's ring with associated mucosal thickening on the CT images, which could be inflammatory or due to lymphoma. 4. No splenic enlargement or hypermetabolism. 5. Diffuse hypermetabolism throughout the axial skeleton without discrete bone lesions on the CT images, worrisome for diffuse osseous involvement by lymphoma.   04/29/2016 Imaging   MUGA- Left ventricular ejection fraction equals 66 %.   05/05/2016 - 08/19/2016 Chemotherapy   The patient had DOXOrubicin (ADRIAMYCIN) chemo injection 116 mg, 50 mg/m2 = 116 mg, Intravenous,  Once, 3 of 6 cycles  palonosetron (ALOXI) injection 0.25 mg, 0.25 mg, Intravenous,  Once, 3 of 6 cycles  pegfilgrastim (NEULASTA ONPRO KIT) injection 6 mg, 6 mg, Subcutaneous, Once, 3 of 6 cycles  vinCRIStine (ONCOVIN) 2 mg in sodium chloride 0.9 % 50 mL chemo infusion, 2 mg, Intravenous,  Once, 3 of 6 cycles Dose modification: 1 mg (original dose 2 mg, Cycle 3, Reason: Dose not tolerated, Comment: neuropathy)  riTUXimab (RITUXAN) 900 mg in sodium  chloride 0.9 % 250 mL (2.6471 mg/mL) chemo infusion, 375 mg/m2 = 900 mg, Intravenous,  Once, 3 of 6 cycles  cyclophosphamide (CYTOXAN) 1,740 mg in sodium chloride 0.9 % 250 mL chemo infusion, 750 mg/m2 = 1,740 mg, Intravenous,  Once, 3 of 6 cycles  for chemotherapy treatment.     07/02/2016 Procedure   Flexible cystoscopy by Dr. Alyson Ingles (Urology) discovering a 1 cm lesion with clot attached on the posterior wall of bladder.   07/04/2016 PET scan   1. Interval improvement in hypermetabolic nodal activity within the axilla, pelvis and inguinal regions bilaterally consistent with positive response to therapy. Deauville stage III. 2. Interval improvement in the previous demonstrated multifocal subcutaneous activity. There is residual prominent activity in the anterior left perineal region which could be inflammatory.  There is no focal hypermetabolic activity to suggest osseous metastatic disease. Mildly prominent marrow activity is again noted throughout the axial and proximal appendicular skeleton.   09/08/2016 PET scan   No significant change in mild hypermetabolic lymphadenopathy in bilateral axillary, external iliac, and inguinal chains (Deauville score 3). No new or increased hypermetabolic lymphadenopathy identified .  Increased multifocal hypermetabolic subcutaneous foci within the chest, abdomen and pelvis. This is of uncertain etiology and clinical significance .   01/06/2017 PET scan   IMPRESSION: 1. Scattered mild adenopathy in the neck, chest, and pelvis with increased metabolic activity, along with newly hypermetabolic cutaneous lesions, primarily Deauville 4 and Deauville 5 activity as noted above. 2. Geographic hepatic steatosis.      CANCER STAGING: Cancer Staging No matching staging information was found for the patient.   INTERVAL HISTORY:  [name] 33 y.o. female seen for follow-up of lymphoma.  Appetite and energy levels are 50%.  Reports shortness  of breath on exertion.  Numbness in the hands were seen.  Leg swelling has been stable.  No pain reported.  She reports that she has been using BiPAP for the last 3 to 4 weeks, after she was diagnosed with sleep apnea.  Denies palpating any new lumps.  No fevers or infections reported.  Reports some night sweats for the past 2 to 4 weeks.  Does not happen every night.  They are not drenching type.  She did not have any major B symptoms at diagnosis.    REVIEW OF SYSTEMS:  Review of Systems  Cardiovascular: Positive for leg swelling.  Neurological: Positive for numbness.  Psychiatric/Behavioral: Positive for sleep disturbance.     PAST MEDICAL/SURGICAL HISTORY:  Past Medical History:  Diagnosis Date  . Anxiety   . Asthma    had since childhood; has not had to use inhaler or nebulizer for 8-9 years. Been doing well.  . Cervical lymphadenopathy    left groin lymph node, axillary lymph nodes:  seen on PET scan 02/2018  . Dyspnea   . Headache(784.0)    usually controlled c Tylenol when not pregnant, but more painful headaches  during preg. due to hypertension.  . Hidradenitis suppurativa   . Hodgkin lymphoma, nodular lymphocyte predominance ([place]) 04/15/2016  . Hypertension December 2011   Been on Ald[place] since Dec. 2011  . Iron deficiency 01/13/2017  . Lymphoma, Hodgkin's ([place])    Stage IV  . Sleep apnea    Past Surgical History:  Procedure Laterality Date  . ABDOMINAL HYSTERECTOMY  05/2017  . ADENOIDECTOMY     age 1  . BREAST RECONSTRUCTION  December 2001   breast reduction at age 74  . FULGURATION OF BLADDER TUMOR N/A 07/23/2016   Procedure: CYSTOSCOPY, BIOPSY AND FULGURATION OF BLADDER;  Surgeon: Cleon Gustin, MD;  Location: [facility];  Service: Urology;  Laterality: N/A;  . MASS EXCISION Right 04/07/2016   Procedure: EXCISION/BIOPSY SOFT TISSUE MASS RIGHT THIGH;  Surgeon: Aviva Signs, MD;  Location: [facility];  Service: General;  Laterality: Right;  . port a cath placed    .  PORTACATH PLACEMENT Left 04/28/2016   Procedure: INSERTION PORT-A-CATH;  Surgeon: Aviva Signs, MD;  Location: [facility];  Service: General;  Laterality: Left;     SOCIAL HISTORY:  Social History   Socioeconomic History  . Marital status: Married    Spouse name: Not on file  . Number of children: Not on file  . Years of education: Not on file  . Highest education level: Not on file  Occupational History  . Not on file  Social Needs  . Financial resource strain: Not hard at all  . Food insecurity    Worry: Never true    Inability: Never true  . Transportation needs    Medical: Not on file    Non-medical: Not on file  Tobacco Use  . Smoking status: Never Smoker  . Smokeless tobacco: Never Used  Substance and Sexual Activity  . Alcohol use: Yes    Alcohol/week: 1.0 standard drinks    Types: 1 Glasses of wine per week  . Drug use: No  . Sexual activity: Yes    Birth control/protection: I.U.D.  Lifestyle  . Physical activity    Days per week: 4 days    Minutes per session: 120 min  . Stress: Not at all  Relationships  . Social Herbalist on  phone: More than three times a week    Gets together: More than three times a week    Attends religious service: More than 4 times per year    Active member of club or organization: Yes    Attends meetings of clubs or organizations: More than 4 times per year    Relationship status: Married  . Intimate partner violence    Fear of current or ex partner: No    Emotionally abused: No    Physically abused: No    Forced sexual activity: No  Other Topics Concern  . Not on file  Social History Narrative  . Not on file    FAMILY HISTORY:  Family History  Problem Relation Age of Onset  . Hypertension Mother   . Cancer Mother   . Hypertension Father   . Heart disease Maternal Grandmother   . Diabetes Paternal Grandmother     CURRENT MEDICATIONS:  Outpatient Encounter Medications as of 06/28/2019  Medication Sig  .  carvedilol (COREG) 6.25 MG tablet Take 1 tablet (6.25 mg total) by mouth 2 (two) times daily with a meal.  . lisinopril (PRINIVIL,ZESTRIL) 5 MG tablet Take 1 tablet (5 mg total) by mouth daily.  . potassium chloride SA (K-DUR,KLOR-CON) 20 MEQ tablet Take 1 tablet (20 mEq total) by mouth daily.  . Vitamin D, Ergocalciferol, (DRISDOL) 50000 units CAPS capsule Take 50,000 Units by mouth every Monday. Takes on Mondays.  Marland Kitchen albuterol (VENTOLIN HFA) 108 (90 Base) MCG/ACT inhaler Inhale into the lungs.   No facility-administered encounter medications on file as of 06/28/2019.     ALLERGIES:  No Known Allergies   PHYSICAL EXAM:  ECOG Performance status: 0  Vitals:   06/28/19 1608  BP: (!) 142/107  Pulse: (!) 111  Resp: 20  Temp: 97.7 F (36.5 C)  SpO2: 99%   Filed Weights   06/28/19 1608  Weight: 291 lb (132 kg)    Physical Exam Vitals signs reviewed.  Constitutional:      Appearance: Normal appearance.  Cardiovascular:     Rate and Rhythm: Normal rate and regular rhythm.     Heart sounds: Normal heart sounds.  Pulmonary:     Effort: Pulmonary effort is normal.     Breath sounds: Normal breath sounds.  Abdominal:     General: There is no distension.     Palpations: Abdomen is soft. There is no mass.  Musculoskeletal:        General: No swelling.  Skin:    General: Skin is warm.  Neurological:     General: No focal deficit present.     Mental Status: She is alert and oriented to person, place, and time.  Psychiatric:        Mood and Affect: Mood normal.        Behavior: Behavior normal.    Bilateral axillary lymphadenopathy palpable.  Right more than left.  LABORATORY DATA:  I have reviewed the labs as listed.  CBC    Component Value Date/Time   WBC 9.1 05/30/2019 1041   RBC 5.46 (H) 05/30/2019 1041   HGB 14.0 05/30/2019 1041   HCT 46.3 (H) 05/30/2019 1041   PLT 430 (H) 05/30/2019 1041   MCV 84.8 05/30/2019 1041   MCH 25.6 (L) 05/30/2019 1041   MCHC 30.2  05/30/2019 1041   RDW 14.0 05/30/2019 1041   LYMPHSABS 2.1 05/30/2019 1041   MONOABS 0.7 05/30/2019 1041   EOSABS 0.3 05/30/2019 1041   BASOSABS 0.0 05/30/2019 1041  CMP Latest Ref Rng & Units 05/30/2019 12/02/2018 09/19/2018  Glucose 70 - 99 mg/dL 100(H) 94 93  BUN 6 - 20 mg/dL _0 Creatinine 0.44 - 1.00 mg/dL 0.85 0.74 0.56  Sodium 135 - 145 mmol/L 138 136 133(L)  Potassium 3.5 - 5.1 mmol/L 3.9 3.7 3.4(L)  Chloride 98 - 111 mmol/L 101 103 102  CO2 22 - 32 mmol/L _1 Calcium 8.9 - 10.3 mg/dL 9.2 9.3 9.0  Total Protein 6.5 - 8.1 g/dL 7.2 7.5 -  Total Bilirubin 0.3 - 1.2 mg/dL 0.5 0.6 -  Alkaline Phos 38 - 126 U/L 39 45 -  AST 15 - 41 U/L 16 13(L) -  ALT 0 - 44 U/L 18 14 -       DIAGNOSTIC IMAGING:  I have independently reviewed the scans and discussed with the patient.   I have reviewed Venita Lick LPN's note and agree with the documentation.  I personally performed a face-to-face visit, made revisions and my assessment and plan is as follows.    ASSESSMENT & PLAN:   Hodgkin lymphoma, nodular lymphocyte predominance ([place]) 1.  Nodular lymphocyte predominant Hodgkin's lymphoma: - Presentation with right inguinal lymphadenopathy, excisional biopsy on 04/07/2016, positive for night sweats - PET/CT scan showed hypermetabolic enlarged bilateral axillary, bilateral external iliac, bilateral inguinal lymph nodes on 04/25/2016 -6 cycles of R-CHOP from 05/05/2016 through 08/19/2016 - She had some residual adenopathy at the end of chemotherapy which have appeared to have slightly increased on subsequent scans.  It was unclear if this was reactive adenopathy from her hidradenitis versus recurrent lymphoma.  Her last PET CT scan was done in August 2018 showed focal uptake in the left inguinal skin surface, likely related to inflammation. -PET scan from 03/08/2018 showed hypermetabolic right axillary lymph node measuring 1.8 cm, previously 1.2 cm with SUV of 11, previously 6.2.  No  abdominal adenopathy or organ involvement.  Hypermetabolic left groin and external iliac lymph nodes were more or less stable. -We reviewed results of the CT CAP from 06/24/2019.  Bilateral axillary lymphadenopathy has increased by 0.1 cm.  Bilateral external iliac lymph nodes have increased by 2 mm.  No new adenopathy. -She also reported some night sweats which started 3 to 4 weeks ago.  She reports 1 or 2 episodes of night sweats per week.  She already had a hysterectomy. -We reviewed her blood work which is grossly within normal limits. -She also reported that she has been started on BiPAP machine for her severe sleep apnea about 4 weeks ago.  Since then she also reported some worsening shortness of breath on exertion.  I have told her to reach out to her pulmonary doctor. -If the night sweats get any worse, she will call us back.  Otherwise I will see her back in 6 months with repeat CT CAP.      Orders placed this encounter:  Orders Placed This Encounter  Procedures  . CT Chest W Contrast  . CT Abdomen Pelvis W Contrast  . CBC with Differential/Platelet  . Comprehensive metabolic panel  . Lactate dehydrogenase  . Sedimentation rate      Derek Jack, MD [place] [phone]

## 2019-06-28 NOTE — Assessment & Plan Note (Signed)
1.  Nodular lymphocyte predominant Hodgkin's lymphoma: - Presentation with right inguinal lymphadenopathy, excisional biopsy on 04/07/2016, positive for night sweats - PET/CT scan showed hypermetabolic enlarged bilateral axillary, bilateral external iliac, bilateral inguinal lymph nodes on 04/25/2016 -6 cycles of R-CHOP from 05/05/2016 through 08/19/2016 - She had some residual adenopathy at the end of chemotherapy which have appeared to have slightly increased on subsequent scans.  It was unclear if this was reactive adenopathy from her hidradenitis versus recurrent lymphoma.  Her last PET CT scan was done in August 2018 showed focal uptake in the left inguinal skin surface, likely related to inflammation. -PET scan from 03/08/2018 showed hypermetabolic right axillary lymph node measuring 1.8 cm, previously 1.2 cm with SUV of 11, previously 6.2.  No abdominal adenopathy or organ involvement.  Hypermetabolic left groin and external iliac lymph nodes were more or less stable. -We reviewed results of the CT CAP from 06/24/2019.  Bilateral axillary lymphadenopathy has increased by 0.1 cm.  Bilateral external iliac lymph nodes have increased by 2 mm.  No new adenopathy. -She also reported some night sweats which started 3 to 4 weeks ago.  She reports 1 or 2 episodes of night sweats per week.  She already had a hysterectomy. -We reviewed her blood work which is grossly within normal limits. -She also reported that she has been started on BiPAP machine for her severe sleep apnea about 4 weeks ago.  Since then she also reported some worsening shortness of breath on exertion.  I have told her to reach out to her pulmonary doctor. -If the night sweats get any worse, she will call us back.  Otherwise I will see her back in 6 months with repeat CT CAP.

## 2019-08-04 ENCOUNTER — Ambulatory Visit: Payer: Self-pay | Admitting: Adult Health

## 2019-08-04 ENCOUNTER — Telehealth: Payer: Self-pay | Admitting: Adult Health

## 2019-08-04 ENCOUNTER — Telehealth: Payer: Self-pay

## 2019-08-04 NOTE — Telephone Encounter (Signed)
Patient was a no call/no show for their appointment today.   

## 2019-08-04 NOTE — Telephone Encounter (Signed)
Patient was originally scheduled to see Jinny Blossom this am at 10. MM is out and I attempted to contact patient twice to discuss appt. Patient did not answer and does not have a vm set up. Janett Billow had an open spot for [phone] so I asked Janett Billow for approval to move pt to this spot, in case pt shows up, d/t pt needing next available for initial bipap f/u. Janett Billow approved & I placed pt in spot.

## 2019-08-08 ENCOUNTER — Encounter: Payer: Self-pay | Admitting: Adult Health

## 2019-08-16 ENCOUNTER — Telehealth: Payer: Self-pay | Admitting: Cardiovascular Disease

## 2019-08-16 MED ORDER — CARVEDILOL 6.25 MG PO TABS: 6.2500 mg | ORAL_TABLET | Freq: Two times a day (BID) | ORAL | 1 refills | Status: DC

## 2019-08-16 NOTE — Telephone Encounter (Signed)
*  STAT* If patient is at the pharmacy, call can be transferred to refill team.   1. Which medications need to be refilled?  carvedilol (COREG) 6.25 MG tablet   2. Which pharmacy/location (including street and city if local pharmacy) is medication to be sent to? [facility]  3. Do they need a 30 day or 90 day supply? [facility]

## 2019-08-16 NOTE — Telephone Encounter (Signed)
Refill sent.

## 2019-10-27 ENCOUNTER — Emergency Department (HOSPITAL_COMMUNITY): Payer: 59

## 2019-10-27 ENCOUNTER — Inpatient Hospital Stay (HOSPITAL_COMMUNITY)
Admission: EM | Admit: 2019-10-27 | Discharge: 2019-10-30 | DRG: 291 | Disposition: A | Discharge status: Home / Self Care | Payer: 59 | Attending: Internal Medicine | Admitting: Internal Medicine

## 2019-10-27 ENCOUNTER — Other Ambulatory Visit: Payer: Self-pay

## 2019-10-27 ENCOUNTER — Encounter (HOSPITAL_COMMUNITY): Payer: Self-pay | Admitting: Emergency Medicine

## 2019-10-27 DIAGNOSIS — Z6841 Body Mass Index (BMI) 40.0 and over, adult: Secondary | ICD-10-CM

## 2019-10-27 DIAGNOSIS — Z79899 Other long term (current) drug therapy: Secondary | ICD-10-CM

## 2019-10-27 DIAGNOSIS — Z90710 Acquired absence of both cervix and uterus: Secondary | ICD-10-CM

## 2019-10-27 DIAGNOSIS — I110 Hypertensive heart disease with heart failure: Principal | ICD-10-CM | POA: Diagnosis present

## 2019-10-27 DIAGNOSIS — Z8249 Family history of ischemic heart disease and other diseases of the circulatory system: Secondary | ICD-10-CM | POA: Diagnosis not present

## 2019-10-27 DIAGNOSIS — J45909 Unspecified asthma, uncomplicated: Secondary | ICD-10-CM | POA: Diagnosis present

## 2019-10-27 DIAGNOSIS — I428 Other cardiomyopathies: Secondary | ICD-10-CM | POA: Diagnosis present

## 2019-10-27 DIAGNOSIS — J9600 Acute respiratory failure, unspecified whether with hypoxia or hypercapnia: Secondary | ICD-10-CM | POA: Diagnosis present

## 2019-10-27 DIAGNOSIS — E66813 Obesity, class 3: Secondary | ICD-10-CM | POA: Diagnosis present

## 2019-10-27 DIAGNOSIS — Z8572 Personal history of non-Hodgkin lymphomas: Secondary | ICD-10-CM | POA: Diagnosis not present

## 2019-10-27 DIAGNOSIS — J9601 Acute respiratory failure with hypoxia: Secondary | ICD-10-CM

## 2019-10-27 DIAGNOSIS — Z20822 Contact with and (suspected) exposure to covid-19: Secondary | ICD-10-CM | POA: Diagnosis present

## 2019-10-27 DIAGNOSIS — R0602 Shortness of breath: Secondary | ICD-10-CM | POA: Diagnosis present

## 2019-10-27 DIAGNOSIS — G4733 Obstructive sleep apnea (adult) (pediatric): Secondary | ICD-10-CM | POA: Diagnosis present

## 2019-10-27 DIAGNOSIS — I5023 Acute on chronic systolic (congestive) heart failure: Secondary | ICD-10-CM | POA: Diagnosis present

## 2019-10-27 DIAGNOSIS — C8100 Nodular lymphocyte predominant Hodgkin lymphoma, unspecified site: Secondary | ICD-10-CM | POA: Diagnosis present

## 2019-10-27 DIAGNOSIS — Z9221 Personal history of antineoplastic chemotherapy: Secondary | ICD-10-CM | POA: Diagnosis not present

## 2019-10-27 DIAGNOSIS — Z833 Family history of diabetes mellitus: Secondary | ICD-10-CM

## 2019-10-27 DIAGNOSIS — I340 Nonrheumatic mitral (valve) insufficiency: Secondary | ICD-10-CM | POA: Diagnosis not present

## 2019-10-27 LAB — COMPREHENSIVE METABOLIC PANEL
ALT: 30 U/L (ref 0–44)
AST: 25 U/L (ref 15–41)
Albumin: 3.6 g/dL (ref 3.5–5.0)
Alkaline Phosphatase: 40 U/L (ref 38–126)
Anion gap: 6 (ref 5–15)
BUN: 17 mg/dL (ref 6–20)
CO2: 24 mmol/L (ref 22–32)
Calcium: 9.3 mg/dL (ref 8.9–10.3)
Chloride: 105 mmol/L (ref 98–111)
Creatinine, Ser: 0.82 mg/dL (ref 0.44–1.00)
GFR calc Af Amer: >60 mL/min (ref >60)
GFR calc non Af Amer: >60 mL/min (ref >60)
Glucose, Bld: 106 mg/dL — ABNORMAL HIGH (ref 70–99)
Potassium: 3.7 mmol/L (ref 3.5–5.1)
Sodium: 135 mmol/L (ref 135–145)
Total Bilirubin: 0.9 mg/dL (ref 0.3–1.2)
Total Protein: 7 g/dL (ref 6.5–8.1)

## 2019-10-27 LAB — POC URINE PREG, ED: Preg Test, Ur: NEGATIVE

## 2019-10-27 LAB — CBC WITH DIFFERENTIAL/PLATELET
Abs Immature Granulocytes: 0.02 10*3/uL (ref 0.00–0.07)
Basophils Absolute: 0 10*3/uL (ref 0.0–0.1)
Basophils Relative: 1 %
Eosinophils Absolute: 0.2 10*3/uL (ref 0.0–0.5)
Eosinophils Relative: 2 %
HCT: 43 % (ref 36.0–46.0)
Hemoglobin: 13.1 g/dL (ref 12.0–15.0)
Immature Granulocytes: 0 %
Lymphocytes Relative: 20 %
Lymphs Abs: 1.6 10*3/uL (ref 0.7–4.0)
MCH: 25.1 pg — ABNORMAL LOW (ref 26.0–34.0)
MCHC: 30.5 g/dL (ref 30.0–36.0)
MCV: 82.5 fL (ref 80.0–100.0)
Monocytes Absolute: 0.6 10*3/uL (ref 0.1–1.0)
Monocytes Relative: 8 %
Neutro Abs: 5.5 10*3/uL (ref 1.7–7.7)
Neutrophils Relative %: 69 %
Platelets: 463 10*3/uL — ABNORMAL HIGH (ref 150–400)
RBC: 5.21 MIL/uL — ABNORMAL HIGH (ref 3.87–5.11)
RDW: 14.4 % (ref 11.5–15.5)
WBC: 7.8 10*3/uL (ref 4.0–10.5)
nRBC: 0 % (ref 0.0–0.2)

## 2019-10-27 LAB — RESPIRATORY PANEL BY RT PCR (FLU A&B, COVID)
Influenza A by PCR: NEGATIVE
Influenza B by PCR: NEGATIVE
SARS Coronavirus 2 by RT PCR: NEGATIVE

## 2019-10-27 LAB — D-DIMER, QUANTITATIVE: D-Dimer, Quant: 1.12 ug/mL-FEU — ABNORMAL HIGH (ref 0.00–0.50)

## 2019-10-27 LAB — FIBRINOGEN: Fibrinogen: 439 mg/dL (ref 210–475)

## 2019-10-27 LAB — LACTIC ACID, PLASMA: Lactic Acid, Venous: 1 mmol/L (ref 0.5–1.9)

## 2019-10-27 LAB — POC SARS CORONAVIRUS 2 AG -  ED: SARS Coronavirus 2 Ag: NEGATIVE

## 2019-10-27 LAB — LACTATE DEHYDROGENASE: LDH: 123 U/L (ref 98–192)

## 2019-10-27 LAB — BRAIN NATRIURETIC PEPTIDE: B Natriuretic Peptide: 256 pg/mL — ABNORMAL HIGH (ref 0.0–100.0)

## 2019-10-27 LAB — FERRITIN: Ferritin: 33 ng/mL (ref 11–307)

## 2019-10-27 LAB — TRIGLYCERIDES: Triglycerides: 64 mg/dL (ref <150)

## 2019-10-27 LAB — TROPONIN I (HIGH SENSITIVITY): Troponin I (High Sensitivity): 6 ng/L (ref <18)

## 2019-10-27 LAB — C-REACTIVE PROTEIN: CRP: 2.8 mg/dL — ABNORMAL HIGH (ref <1.0)

## 2019-10-27 LAB — PROCALCITONIN: Procalcitonin: <0.1 ng/mL

## 2019-10-27 MED ORDER — SODIUM CHLORIDE 0.9% FLUSH: 3.0000 mL | Freq: Two times a day (BID) | INTRAVENOUS | Status: DC

## 2019-10-27 MED ORDER — ACETAMINOPHEN 325 MG PO TABS
650.0000 mg | ORAL_TABLET | Freq: Four times a day (QID) | ORAL | Status: DC | PRN
  Administered 2019-10-28 (×2): 650 mg via ORAL
  Filled 2019-10-27 (×2): qty 2

## 2019-10-27 MED ORDER — LISINOPRIL 5 MG PO TABS
5.0000 mg | ORAL_TABLET | Freq: Every day | ORAL | Status: DC
  Administered 2019-10-28 – 2019-10-30 (×3): 5 mg via ORAL
  Filled 2019-10-27 (×3): qty 1

## 2019-10-27 MED ORDER — ALBUTEROL SULFATE (2.5 MG/3ML) 0.083% IN NEBU: 3.0000 mL | INHALATION_SOLUTION | Freq: Four times a day (QID) | RESPIRATORY_TRACT | Status: DC | PRN

## 2019-10-27 MED ORDER — IOHEXOL 350 MG/ML SOLN
100.0000 mL | Freq: Once | INTRAVENOUS | Status: AC | PRN
  Administered 2019-10-27: 100 mL via INTRAVENOUS

## 2019-10-27 MED ORDER — METHYLPREDNISOLONE SODIUM SUCC 125 MG IJ SOLR
60.0000 mg | Freq: Two times a day (BID) | INTRAMUSCULAR | Status: DC
  Administered 2019-10-28 – 2019-10-30 (×5): 60 mg via INTRAVENOUS
  Filled 2019-10-27 (×5): qty 2

## 2019-10-27 MED ORDER — ALBUTEROL SULFATE HFA 108 (90 BASE) MCG/ACT IN AERS
2.0000 | INHALATION_SPRAY | Freq: Once | RESPIRATORY_TRACT | Status: AC
  Administered 2019-10-27: 2 via RESPIRATORY_TRACT
  Filled 2019-10-27: qty 6.7

## 2019-10-27 MED ORDER — ENOXAPARIN SODIUM 80 MG/0.8ML ~~LOC~~ SOLN
0.5000 mg/kg | SUBCUTANEOUS | Status: DC
  Administered 2019-10-27 – 2019-10-29 (×3): 65 mg via SUBCUTANEOUS
  Filled 2019-10-27 (×3): qty 0.8

## 2019-10-27 MED ORDER — CHLORHEXIDINE GLUCONATE CLOTH 2 % EX PADS
6.0000 | MEDICATED_PAD | Freq: Every day | CUTANEOUS | Status: DC
  Administered 2019-10-29: 6 via TOPICAL

## 2019-10-27 MED ORDER — FUROSEMIDE 10 MG/ML IJ SOLN
40.0000 mg | Freq: Two times a day (BID) | INTRAMUSCULAR | Status: AC
  Administered 2019-10-28 – 2019-10-29 (×4): 40 mg via INTRAVENOUS
  Filled 2019-10-27 (×5): qty 4

## 2019-10-27 MED ORDER — POTASSIUM CHLORIDE CRYS ER 20 MEQ PO TBCR
20.0000 meq | EXTENDED_RELEASE_TABLET | Freq: Every day | ORAL | Status: DC
  Administered 2019-10-27 – 2019-10-29 (×3): 20 meq via ORAL
  Filled 2019-10-27 (×3): qty 1

## 2019-10-27 MED ORDER — ONDANSETRON HCL 4 MG/2ML IJ SOLN: 4.0000 mg | Freq: Four times a day (QID) | INTRAMUSCULAR | Status: DC | PRN

## 2019-10-27 MED ORDER — FUROSEMIDE 10 MG/ML IJ SOLN
INTRAMUSCULAR | Status: AC
  Filled 2019-10-27: qty 4

## 2019-10-27 MED ORDER — SODIUM CHLORIDE 0.9 % IV SOLN: 250.0000 mL | INTRAVENOUS | Status: DC | PRN

## 2019-10-27 MED ORDER — ONDANSETRON HCL 4 MG PO TABS: 4.0000 mg | ORAL_TABLET | Freq: Four times a day (QID) | ORAL | Status: DC | PRN

## 2019-10-27 MED ORDER — GUAIFENESIN-DM 100-10 MG/5ML PO SYRP: 10.0000 mL | ORAL_SOLUTION | Freq: Four times a day (QID) | ORAL | Status: DC | PRN

## 2019-10-27 MED ORDER — POLYETHYLENE GLYCOL 3350 17 G PO PACK: 17.0000 g | PACK | Freq: Every day | ORAL | Status: DC | PRN

## 2019-10-27 MED ORDER — ACETAMINOPHEN 650 MG RE SUPP: 650.0000 mg | Freq: Four times a day (QID) | RECTAL | Status: DC | PRN

## 2019-10-27 MED ORDER — FUROSEMIDE 10 MG/ML IJ SOLN
40.0000 mg | Freq: Once | INTRAMUSCULAR | Status: AC
  Administered 2019-10-27: 40 mg via INTRAVENOUS

## 2019-10-27 MED ORDER — SODIUM CHLORIDE 0.9% FLUSH: 3.0000 mL | INTRAVENOUS | Status: DC | PRN

## 2019-10-27 MED ORDER — CARVEDILOL 3.125 MG PO TABS
6.2500 mg | ORAL_TABLET | Freq: Two times a day (BID) | ORAL | Status: DC
  Administered 2019-10-28 – 2019-10-30 (×5): 6.25 mg via ORAL
  Filled 2019-10-27 (×5): qty 2

## 2019-10-27 MED ORDER — METHYLPREDNISOLONE SODIUM SUCC 125 MG IJ SOLR
125.0000 mg | Freq: Once | INTRAMUSCULAR | Status: AC
  Administered 2019-10-27: 125 mg via INTRAVENOUS
  Filled 2019-10-27: qty 2

## 2019-10-27 NOTE — ED Notes (Signed)
Pt is breathing a lot better, respirations are now 20-24 per minute, pt maintains normal O2 levels (96%) with O2 decreased to 2 L.

## 2019-10-27 NOTE — ED Provider Notes (Signed)
[facility] Provider Note   CSN: [id number] Arrival date & time: 10/27/19  1551     History Chief Complaint  Patient presents with  . Shortness of Breath    [name] is a 34 y.o. female.  HPI   This patient is a 34 year old female, she has a known history of asthma, has had very good control over her asthma for quite some time.  She also has a history of lymphoma, Hodgkin's, has been in remission for 3 years.  She has sleep apnea and hypertension and takes daily carvedilol and lisinopril.  She presents today after having her first dose of the Covid vaccine earlier in the day because of increasing shortness of breath.  This started 1 week ago and has been progressively getting worse.  She now notes that she cannot walk across the house without becoming severely dyspneic.  She has been trying to use her albuterol but is not getting much relief, has not recently been on any steroid therapy, has never been intubated and has not been hospitalized since 1 year ago when she had a respiratory illness.  Her symptoms are severe persistent and gradually worsening.  She denies any other systemic symptoms including fevers diarrhea dysuria.  She does endorse having some leg swelling which is unusual and new for her.  It seems symmetrical.  Past Medical History:  Diagnosis Date  . Anxiety   . Asthma    had since childhood; has not had to use inhaler or nebulizer for 8-9 years. Been doing well.  . Cervical lymphadenopathy    left groin lymph node, axillary lymph nodes:  seen on PET scan 02/2018  . Dyspnea   . Headache(784.0)    usually controlled c Tylenol when not pregnant, but more painful headaches  during preg. due to hypertension.  . Hidradenitis suppurativa   . 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 Hypertension December 2011   Been on [place] since Dec. 2011  . Iron deficiency 01/13/2017  . 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 Sleep apnea     Patient Active Problem List   Diagnosis Date Noted  . Acute respiratory failure ([place]) 10/27/2019  . 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 Severe sleep apnea 05/10/2019  . Nonischemic cardiomyopathy ([place]) 09/19/2018  . Obesity, Class III, BMI 40-49.9 (morbid obesity) ([place]) 09/19/2018  . Acute middle ear effusion, right   . Syncope and collapse   . Influenza A 09/18/2018  . Acute otitis media with effusion of right ear 09/18/2018  . Dehydration   . Syncope 09/17/2018  . Sleep apnea 09/17/2018  . Cervical lymphadenopathy 09/17/2018  . Hypokalemia 09/17/2018  . Hyponatremia 09/17/2018  . Iron deficiency 01/13/2017  . 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 Encounter for gynecological examination 06/01/2014  . Hypertension 02/02/2013  . Abscess of breast, right 09/01/2012  . Hypertension in pregnancy, preeclampsia, severe, antepartum 12/11/2011  . Chronic hypertension complicating or reason for care during pregnancy 12/11/2011    Past Surgical History:  Procedure Laterality Date  . ABDOMINAL HYSTERECTOMY  05/2017  . ADENOIDECTOMY     age 61  . BREAST RECONSTRUCTION  December 2001   breast reduction at age 70  . FULGURATION OF BLADDER TUMOR N/A 07/23/2016   Procedure: CYSTOSCOPY, BIOPSY AND FULGURATION OF BLADDER;  Surgeon: Cleon Gustin, MD;  Location: [facility];  Service: Urology;  Laterality: N/A;  . MASS EXCISION Right 04/07/2016  Procedure: EXCISION/BIOPSY SOFT TISSUE MASS RIGHT THIGH;  Surgeon: Aviva Signs, MD;  Location: [facility];  Service: General;  Laterality: Right;  . port a cath placed    . PORTACATH PLACEMENT Left 04/28/2016   Procedure: INSERTION PORT-A-CATH;  Surgeon: Aviva Signs, MD;  Location: [facility];  Service: General;  Laterality: Left;     OB History    Gravida  3   Para  2   Term  1   Preterm  1   AB  1   Living  2     SAB  1   TAB  0   Ectopic  0   Multiple  0   Live Births  1            Family History  Problem Relation Age of Onset  . Hypertension Mother   . Cancer Mother   . Hypertension Father   . Heart disease Maternal Grandmother   . Diabetes Paternal Grandmother     Social History   Tobacco Use  . Smoking status: Never Smoker  . Smokeless tobacco: Never Used  Substance Use Topics  . Alcohol use: Yes    Alcohol/week: 1.0 standard drinks    Types: 1 Glasses of wine per week  . Drug use: No    Home Medications Prior to Admission medications   Medication Sig Start Date End Date Taking? Authorizing Provider  albuterol (VENTOLIN HFA) 108 (90 Base) MCG/ACT inhaler Inhale 1-2 puffs into the lungs every 6 (six) hours as needed for wheezing or shortness of breath.  12/03/17  Yes [provider]  carvedilol (COREG) 6.25 MG tablet Take 1 tablet (6.25 mg total) by mouth 2 (two) times daily with a meal. 08/16/19  Yes Herminio Commons, MD  COVID-19 mRNA vaccine, Moderna, 100 MCG/0.5ML SUSP Inject 0.5 mLs into the muscle once.   Yes [provider]  ibuprofen (ADVIL) 200 MG tablet Take 200 mg by mouth every 6 (six) hours as needed for mild pain or moderate pain.   Yes [provider]  lisinopril (PRINIVIL,ZESTRIL) 5 MG tablet Take 1 tablet (5 mg total) by mouth daily. 11/05/18 10/27/19 Yes Herminio Commons, MD  potassium chloride SA (K-DUR,KLOR-CON) 20 MEQ tablet Take 1 tablet (20 mEq total) by mouth daily. Patient taking differently: Take 20 mEq by mouth at bedtime.  07/07/16  Yes Baird Cancer, PA-C  Vitamin D, Ergocalciferol, (DRISDOL) 50000 units CAPS capsule Take 50,000 Units by mouth every Monday. Takes on Mondays.   Yes [provider]    Allergies    Patient has no known allergies.  Review of Systems   Review of Systems  All other systems reviewed and are negative.   Physical Exam Updated Vital Signs BP (!) 145/103   Pulse (!) 101   Temp 98.7 F (37.1 C) (Oral)   Resp 18   Ht 1.626 m (5\' 4" )   Wt  129.3 kg   LMP 02/02/2013   SpO2 95%   BMI 48.92 kg/m   Physical Exam Vitals and nursing note reviewed.  Constitutional:      General: She is in acute distress.     Appearance: She is well-developed. She is ill-appearing and toxic-appearing.  HENT:     Head: Normocephalic and atraumatic.     Mouth/Throat:     Pharynx: No oropharyngeal exudate.  Eyes:     General: No scleral icterus.       Right eye: No discharge.  Left eye: No discharge.     Conjunctiva/sclera: Conjunctivae normal.     Pupils: Pupils are equal, round, and reactive to light.  Neck:     Thyroid: No thyromegaly.     Vascular: No JVD.  Cardiovascular:     Rate and Rhythm: Regular rhythm. Tachycardia present.     Heart sounds: Normal heart sounds. No murmur. No friction rub. No gallop.      Comments: Tachycardic to 120 Pulmonary:     Effort: Respiratory distress present.     Breath sounds: Wheezing present. No rales.     Comments: Speaks in very short and sentences, prolonged expiratory phase, rapid breathing tachypneic, shallow Abdominal:     General: Bowel sounds are normal. There is no distension.     Palpations: Abdomen is soft. There is no mass.     Tenderness: There is no abdominal tenderness.  Musculoskeletal:        General: No tenderness. Normal range of motion.     Cervical back: Normal range of motion and neck supple.     Right lower leg: Edema present.     Left lower leg: Edema present.  Lymphadenopathy:     Cervical: No cervical adenopathy.  Skin:    General: Skin is warm and dry.     Findings: No erythema or rash.  Neurological:     Mental Status: She is alert.     Coordination: Coordination normal.     Comments: speaks in very short and sentences, moves all 4 extremities, ambulatory but dyspneic  Psychiatric:        Behavior: Behavior normal.     ED Results / Procedures / Treatments   Labs (all labs ordered are listed, but only abnormal results are displayed) Labs Reviewed   CBC WITH DIFFERENTIAL/PLATELET - Abnormal; Notable for the following components:      Result Value   RBC 5.21 (*)    MCH 25.1 (*)    Platelets 463 (*)    All other components within normal limits  COMPREHENSIVE METABOLIC PANEL - Abnormal; Notable for the following components:   Glucose, Bld 106 (*)    All other components within normal limits  D-DIMER, QUANTITATIVE (NOT AT [facility]) - Abnormal; Notable for the following components:   D-Dimer, Quant 1.12 (*)    All other components within normal limits  C-REACTIVE PROTEIN - Abnormal; Notable for the following components:   CRP 2.8 (*)    All other components within normal limits  BRAIN NATRIURETIC PEPTIDE - Abnormal; Notable for the following components:   B Natriuretic Peptide 256.0 (*)    All other components within normal limits  CULTURE, BLOOD (ROUTINE X 2)  CULTURE, BLOOD (ROUTINE X 2)  RESPIRATORY PANEL BY RT PCR (FLU A&B, COVID)  MRSA PCR SCREENING  LACTIC ACID, PLASMA  PROCALCITONIN  LACTATE DEHYDROGENASE  FERRITIN  TRIGLYCERIDES  FIBRINOGEN  HIV ANTIBODY (ROUTINE TESTING W REFLEX)  BASIC METABOLIC PANEL  CBC  POC URINE PREG, ED  POC SARS CORONAVIRUS 2 AG -  ED  TROPONIN I (HIGH SENSITIVITY)    EKG EKG Interpretation  Date/Time:  Thursday October 27 2019 16:22:45 EST Ventricular Rate:  115 PR Interval:    QRS Duration: 95 QT Interval:  337 QTC Calculation: 467 R Axis:   56 Text Interpretation: Sinus tachycardia Aberrant conduction of SV complex(es) Low voltage, extremity and precordial leads Baseline wander in lead(s) V4 V5 V6 since last tracing no significant change Confirmed by Noemi Chapel [phone]) on 10/27/2019 4:33:14 PM  Radiology CT Angio Chest PE W and/or Wo Contrast  Result Date: 10/27/2019 CLINICAL DATA:  Shortness of breath for 1 week EXAM: CT ANGIOGRAPHY CHEST WITH CONTRAST TECHNIQUE: Multidetector CT imaging of the chest was performed using the standard protocol during bolus administration of  intravenous contrast. Multiplanar CT image reconstructions and MIPs were obtained to evaluate the vascular anatomy. CONTRAST:  11mL OMNIPAQUE IOHEXOL 350 MG/ML SOLN COMPARISON:  06/24/2019 FINDINGS: Cardiovascular: Thoracic aorta and its branches are within normal limits. No aneurysmal dilatation is seen. Mild cardiac enlargement is noted. The pulmonary artery is well visualized within normal branching pattern. No filling defects are identified to suggest pulmonary emboli. Mediastinum/Nodes: Thoracic inlet is within normal limits. Scattered small mediastinal lymph nodes are again seen similar to that noted on the prior exam. The previously seen axillary lymph nodes are stable as well. Esophagus is within normal limits. Lungs/Pleura: Bilateral pleural effusions are seen. Lungs are well aerated bilaterally. Patchy opacities are noted throughout both lungs similar to that seen on the prior chest x-ray. These are new from the prior CT examination. This likely represents combination of pulmonary edema and atypical pneumonia. Correlation with COVID testing is recommended. No focal parenchymal nodules are seen. Upper Abdomen: Visualized upper abdomen is within normal limits. Musculoskeletal: Bony structures show no acute abnormality. Review of the MIP images confirms the above findings. IMPRESSION: No evidence of pulmonary emboli. Bilateral pleural effusions and diffuse bilateral airspace opacities. The effusions suggest diffuse pulmonary edema as an etiology for the airspace opacities although atypical pneumonia deserves consideration as well. Stable axillary adenopathy bilaterally more prominent on the right than the left. Electronically Signed   By: Inez Catalina M.D.   On: 10/27/2019 18:55   DG Chest Port 1 View  Result Date: 10/27/2019 CLINICAL DATA:  Cough and short of breath, CT 06/24/2019 EXAM: PORTABLE CHEST 1 VIEW COMPARISON:  09/17/2018 FINDINGS: Cardiomegaly with vascular congestion. Diffuse bilateral  somewhat nodular appearing opacities. Possible small pleural effusions. Left-sided central venous port tip over the SVC. No pneumothorax. IMPRESSION: 1. Cardiomegaly with central vascular congestion and possible small effusions. 2. Ill-defined somewhat nodular appearing pulmonary opacities, potentially due to atypical pneumonia or pulmonary nodules. CT is suggested for further evaluation Electronically Signed   By: Donavan Foil M.D.   On: 10/27/2019 18:02    Procedures .Critical Care Performed by: Noemi Chapel, MD Authorized by: Noemi Chapel, MD   Critical care provider statement:    Critical care time (minutes):  35   Critical care time was exclusive of:  Separately billable procedures and treating other patients and teaching time   Critical care was necessary to treat or prevent imminent or life-threatening deterioration of the following conditions:  Respiratory failure   Critical care was time spent personally by me on the following activities:  Blood draw for specimens, development of treatment plan with patient or surrogate, discussions with consultants, evaluation of patient's response to treatment, examination of patient, obtaining history from patient or surrogate, ordering and performing treatments and interventions, ordering and review of laboratory studies, ordering and review of radiographic studies, pulse oximetry, re-evaluation of patient's condition and review of old charts   (including critical care time)  Medications Ordered in ED Medications  0.9 %  sodium chloride infusion (has no administration in time range)  albuterol (PROVENTIL) (2.5 MG/3ML) 0.083% nebulizer solution 3 mL (has no administration in time range)  carvedilol (COREG) tablet 6.25 mg (has no administration in time range)  lisinopril (ZESTRIL) tablet 5 mg (has no administration  in time range)  potassium chloride SA (KLOR-CON) CR tablet 20 mEq (has no administration in time range)  enoxaparin (LOVENOX)  injection 65 mg (has no administration in time range)  acetaminophen (TYLENOL) tablet 650 mg (has no administration in time range)    Or  acetaminophen (TYLENOL) suppository 650 mg (has no administration in time range)  ondansetron (ZOFRAN) tablet 4 mg (has no administration in time range)    Or  ondansetron (ZOFRAN) injection 4 mg (has no administration in time range)  polyethylene glycol (MIRALAX / GLYCOLAX) packet 17 g (has no administration in time range)  guaiFENesin-dextromethorphan (ROBITUSSIN DM) 100-10 MG/5ML syrup 10 mL (has no administration in time range)  furosemide (LASIX) injection 40 mg (has no administration in time range)  methylPREDNISolone sodium succinate (SOLU-MEDROL) 125 mg/2 mL injection 60 mg (has no administration in time range)  Chlorhexidine Gluconate Cloth 2 % PADS 6 each (has no administration in time range)  methylPREDNISolone sodium succinate (SOLU-MEDROL) 125 mg/2 mL injection 125 mg (125 mg Intravenous Given 10/27/19 1632)  albuterol (VENTOLIN HFA) 108 (90 Base) MCG/ACT inhaler 2 puff (2 puffs Inhalation Given 10/27/19 1633)  iohexol (OMNIPAQUE) 350 MG/ML injection 100 mL (100 mLs Intravenous Contrast Given 10/27/19 1826)  furosemide (LASIX) injection 40 mg (40 mg Intravenous Given 10/27/19 1841)    ED Course  I have reviewed the triage vital signs and the nursing notes.  Pertinent labs & imaging results that were available during my care of the patient were reviewed by me and considered in my medical decision making (see chart for details).    MDM Rules/Calculators/A&P                      This patient has acute hypoxic respiratory failure, this could be related to reactive airway disease, Covid, pulmonary embolism although given her respiratory pattern and wheezing and decreased air movement I suggest this is probably reactive airway disease.  And will need nebulizer therapy when she is cleared with Covid testing.  She is not febrile, she is slightly  hypertensive, she will need x-ray and labs to rule out other sources including congestive heart failure given her edema.  The patient is agreeable, she does appear critically ill requiring oxygen  This patient continued to be short of breath, tachycardic and hypoxic requiring higher doses of oxygen up to 6 L by nasal cannula by the time of admission.  Laboratory work-up did not reveal a pulmonary embolism but did reveal what was likely pulmonary edema or possibly an atypical infection.  Initial Covid test was negative, repeat was done.  Patient will be admitted to the hospital to high level of care, critical care provided for acute hypoxic respiratory failure  SHAQUELLA PENNELLA was evaluated in Emergency Department on 10/27/2019 for the symptoms described in the history of present illness. She was evaluated in the context of the global COVID-19 pandemic, which necessitated consideration that the patient might be at risk for infection with the SARS-CoV-2 virus that causes COVID-19. Institutional protocols and algorithms that pertain to the evaluation of patients at risk for COVID-19 are in a state of rapid change based on information released by regulatory bodies including the CDC and federal and state organizations. These policies and algorithms were followed during the patient's care in the ED.   Final Clinical Impression(s) / ED Diagnoses Final diagnoses:  Acute respiratory failure with hypoxia ([place])    Rx / DC Orders ED Discharge Orders    None  Noemi Chapel, MD 10/27/19 2234

## 2019-10-27 NOTE — H&P (Signed)
History and Physical    [name] [id number] DOB: 01-25-1986 DOA: 10/27/2019  PCP: Patient, No Pcp Per   Patient coming from: Home  I have personally briefly reviewed patient's old medical records in [place]  Chief Complaint: Difficulty breathing  HPI: [name] is a 34 y.o. female with medical history significant for hypertension, nonischemic cardiomyopathy, asthma, Hodgkin's lymphoma, elevated BMI of 48. Patient presented to the ED with complaints of increasing difficulty breathing that started just after Christmas, has persisted and over the past week has significantly worsened.  She reports cough productive of slightly blood-tinged sputum started today.  She reports wheezing intermittently when she uses her CPAP at night.  Reports some mild central chest pain present when she coughs.  No fever, no chills, no nasal congestion.  She reports chronic lower extremity swelling that is unchanged.  She reports about a 15 pound weight gain over the past year that she attributes to the pandemic and quarantine.  She denies sick contacts.  No Covid positive contacts.  Never been intubated for asthma.  She got her first COVID-19 vaccine shot today, symptoms started before today.  ED Course: 7.  Tachycardic to 118.  Tachypneic to 44.  O2 sats 86% on room air requiring 6 L O2 to keep sats greater than 94%. POC Covid test negative.  Lactic acid normal 1.  Procalcitonin < 0.1.  BNP mildly elevated at 256.  D-dimer elevated 1.12.  CRP elevated at 2.8 otherwise other inflammatory markers unremarkable.  CTA chest -negative for PE, shows bilateral pleural effusions and diffuse bilateral airspace opacities.  Effusion suggest pulmonary edema as etiology for airspace opacities although atypical pneumonia deserves consideration as well.  IV Lasix 40 mg x 1 given.  Respiratory virus panel ordered and pending. Rales and wheezing appreciated by EDP on evaluation in the ED. 125 mg Solu-Medrol  given in ED.  Hospitalist admit for further evaluation and management.  Review of Systems: As per HPI all other systems reviewed and negative.  Past Medical History:  Diagnosis Date  . Anxiety   . Asthma    had since childhood; has not had to use inhaler or nebulizer for 8-9 years. Been doing well.  . Cervical lymphadenopathy    left groin lymph node, axillary lymph nodes:  seen on PET scan 02/2018  . Dyspnea   . Headache(784.0)    usually controlled c Tylenol when not pregnant, but more painful headaches  during preg. due to hypertension.  . Hidradenitis suppurativa   . 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 Hypertension December 2011   Been on [place] since Dec. 2011  . Iron deficiency 01/13/2017  . Lymphoma, Hodgkin's ([place])    Stage IV  . Sleep apnea     Past Surgical History:  Procedure Laterality Date  . ABDOMINAL HYSTERECTOMY  05/2017  . ADENOIDECTOMY     age 45  . BREAST RECONSTRUCTION  December 2001   breast reduction at age 11  . FULGURATION OF BLADDER TUMOR N/A 07/23/2016   Procedure: CYSTOSCOPY, BIOPSY AND FULGURATION OF BLADDER;  Surgeon: Cleon Gustin, MD;  Location: [facility];  Service: Urology;  Laterality: N/A;  . MASS EXCISION Right 04/07/2016   Procedure: EXCISION/BIOPSY SOFT TISSUE MASS RIGHT THIGH;  Surgeon: Aviva Signs, MD;  Location: [facility];  Service: General;  Laterality: Right;  . port a cath placed    . PORTACATH PLACEMENT Left 04/28/2016   Procedure: INSERTION PORT-A-CATH;  Surgeon: Aviva Signs, MD;  Location: [facility];  Service: General;  Laterality: Left;     reports that she has never smoked. She has never used smokeless tobacco. She reports current alcohol use of about 1.0 standard drinks of alcohol per week. She reports that she does not use drugs.  No Known Allergies  Family History  Problem Relation Age of Onset  . Hypertension Mother   . Cancer Mother   . Hypertension Father   . Heart disease Maternal  Grandmother   . Diabetes Paternal Grandmother     Prior to Admission medications   Medication Sig Start Date End Date Taking? Authorizing Provider  albuterol (VENTOLIN HFA) 108 (90 Base) MCG/ACT inhaler Inhale 1-2 puffs into the lungs every 6 (six) hours as needed for wheezing or shortness of breath.  12/03/17  Yes [provider]  carvedilol (COREG) 6.25 MG tablet Take 1 tablet (6.25 mg total) by mouth 2 (two) times daily with a meal. 08/16/19  Yes Herminio Commons, MD  COVID-19 mRNA vaccine, Moderna, 100 MCG/0.5ML SUSP Inject 0.5 mLs into the muscle once.   Yes [provider]  ibuprofen (ADVIL) 200 MG tablet Take 200 mg by mouth every 6 (six) hours as needed for mild pain or moderate pain.   Yes [provider]  lisinopril (PRINIVIL,ZESTRIL) 5 MG tablet Take 1 tablet (5 mg total) by mouth daily. 11/05/18 10/27/19 Yes Herminio Commons, MD  potassium chloride SA (K-DUR,KLOR-CON) 20 MEQ tablet Take 1 tablet (20 mEq total) by mouth daily. Patient taking differently: Take 20 mEq by mouth at bedtime.  07/07/16  Yes Kefalas, Manon Hilding, PA-C  Blood-tinged, Ergocalciferol, (DRISDOL) 50000 units CAPS capsule Take 50,000 Units by mouth every Monday. Takes on Mondays.   Yes [provider]    Physical Exam: Vitals:   10/27/19 1700 10/27/19 1745 10/27/19 1800 10/27/19 1930  BP: (!) 144/112  (!) 133/118 (!) 153/112  Pulse: (!) 110 (!) 110 (!) 114 (!) 118  Resp: (!) 36 (!) 25 (!) 44 (!) 30  Temp:      TempSrc:      SpO2: 94% 98% 96% 95%  Weight:      Height:        Constitutional: Obese, Vitals:   10/27/19 1700 10/27/19 1745 10/27/19 1800 10/27/19 1930  BP: (!) 144/112  (!) 133/118 (!) 153/112  Pulse: (!) 110 (!) 110 (!) 114 (!) 118  Resp: (!) 36 (!) 25 (!) 44 (!) 30  Temp:      TempSrc:      SpO2: 94% 98% 96% 95%  Weight:      Height:       Eyes: PERRL, lids and conjunctivae normal ENMT: Mucous membranes are moist.  Neck: normal, supple, no  masses, no thyromegaly Respiratory: clear to auscultation bilaterally, no wheezing, faint crackles at bases.  Mild increased work of breathing, able to make full sentences.  Expectorated sputum in cup at bedside, blood-tinged/pink. Cardiovascular: Tachycardic, regular rate and rhythm, no murmurs / rubs / gallops.  1+ pitting extremity edema to knees.  Bilateral legs warm and well-perfused. Abdomen: full, no tenderness, no masses palpated. No hepatosplenomegaly. Bowel sounds positive.  Musculoskeletal: no clubbing / cyanosis. No joint deformity upper and lower extremities. Good ROM, no contractures. Normal muscle tone.  Skin: no rashes, lesions, ulcers. No induration Neurologic: No apparent cranial nerve abnormality, moving extremities spontaneously Psychiatric: Normal judgment and insight. Alert and oriented x 3. Normal mood.   Labs on Admission: I have personally reviewed following labs and  imaging studies  CBC: Recent Labs  Lab 10/27/19 1631  WBC 7.8  NEUTROABS 5.5  HGB 13.1  HCT 43.0  MCV 82.5  PLT Q000111Q*   Basic Metabolic Panel: Recent Labs  Lab 10/27/19 1631  NA 135  K 3.7  CL 105  CO2 24  GLUCOSE 106*  BUN 17  CREATININE 0.82  CALCIUM 9.3   Liver Function Tests: Recent Labs  Lab 10/27/19 1631  AST 25  ALT 30  ALKPHOS 40  BILITOT 0.9  PROT 7.0  ALBUMIN 3.6   Lipid Profile: Recent Labs    10/27/19 1631  TRIG 64   Anemia Panel: Recent Labs    10/27/19 1631  FERRITIN 33    Radiological Exams on Admission: CT Angio Chest PE W and/or Wo Contrast  Result Date: 10/27/2019 CLINICAL DATA:  Shortness of breath for 1 week EXAM: CT ANGIOGRAPHY CHEST WITH CONTRAST TECHNIQUE: Multidetector CT imaging of the chest was performed using the standard protocol during bolus administration of intravenous contrast. Multiplanar CT image reconstructions and MIPs were obtained to evaluate the vascular anatomy. CONTRAST:  141mL OMNIPAQUE IOHEXOL 350 MG/ML SOLN COMPARISON:   06/24/2019 FINDINGS: Cardiovascular: Thoracic aorta and its branches are within normal limits. No aneurysmal dilatation is seen. Mild cardiac enlargement is noted. The pulmonary artery is well visualized within normal branching pattern. No filling defects are identified to suggest pulmonary emboli. Mediastinum/Nodes: Thoracic inlet is within normal limits. Scattered small mediastinal lymph nodes are again seen similar to that noted on the prior exam. The previously seen axillary lymph nodes are stable as well. Esophagus is within normal limits. Lungs/Pleura: Bilateral pleural effusions are seen. Lungs are well aerated bilaterally. Patchy opacities are noted throughout both lungs similar to that seen on the prior chest x-ray. These are new from the prior CT examination. This likely represents combination of pulmonary edema and atypical pneumonia. Correlation with COVID testing is recommended. No focal parenchymal nodules are seen. Upper Abdomen: Visualized upper abdomen is within normal limits. Musculoskeletal: Bony structures show no acute abnormality. Review of the MIP images confirms the above findings. IMPRESSION: No evidence of pulmonary emboli. Bilateral pleural effusions and diffuse bilateral airspace opacities. The effusions suggest diffuse pulmonary edema as an etiology for the airspace opacities although atypical pneumonia deserves consideration as well. Stable axillary adenopathy bilaterally more prominent on the right than the left. Electronically Signed   By: Inez Catalina M.D.   On: 10/27/2019 18:55   DG Chest Port 1 View  Result Date: 10/27/2019 CLINICAL DATA:  Cough and short of breath, CT 06/24/2019 EXAM: PORTABLE CHEST 1 VIEW COMPARISON:  09/17/2018 FINDINGS: Cardiomegaly with vascular congestion. Diffuse bilateral somewhat nodular appearing opacities. Possible small pleural effusions. Left-sided central venous port tip over the SVC. No pneumothorax. IMPRESSION: 1. Cardiomegaly with central  vascular congestion and possible small effusions. 2. Ill-defined somewhat nodular appearing pulmonary opacities, potentially due to atypical pneumonia or pulmonary nodules. CT is suggested for further evaluation Electronically Signed   By: Donavan Foil M.D.   On: 10/27/2019 18:02    EKG: Independently reviewed.  Sinus tachycardia rate 115.  QTc 467.  No significant change compared to prior EKG.  Assessment/Plan Active Problems:   Acute respiratory failure (HCC)   Acute respiratory failure-currently on 6 L O2, sats greater than 94%, dyspneic, new cough, tachycardic, tachypneic.  Likely secondary to volume overload with possible component of asthma exacerbation.  BNP elevated at 256 (considering BMI probably not reflective of true volume status), CTA chest negative for PE,  but showing bilateral pleural effusions and diffuse bilateral airspace opacities, possible atypical pneumonia also.   D-dimer and CRP elevated, other inflammatory markers unremarkable.  Point-of-care Covid test negative. - Admit to stepdown with new high oxygen requirements and tachypnea - F/u respiratory virus panel - Procalcitonin < 0.1, she afebrile, without leukocytosis, normal lactate, hence will hold off on antibiotics -Continue IV Lasix 40 every 12 hourly -Strict input- output, daily weight -Supplemental O2, mucolytics -Steroids, bronchodilators as needed and scheduled -Updated echocardiogram -Trend troponin  Asthma exacerbation- initial wheezing on arrival to the ED, resolved at the time of my evaluation, she was s/p steroids and bronchodilators.  Exacerbation likely 2/2 volume overload. -Albuterol PRN, scheduled -Solumedrol 125 mg given, continue 60mg  to  q12 hourly  Hypertension-systolic Q000111Q to Q000111Q. -Resume home carvedilol and low-dose lisinopril she reports compliance with both.  History of cardiomyopathy- 09/2018 shows EF of 45 to [id number], LV diastolic G could not be evaluated due to nondiagnostic images, mild  global hypokinesis.  Morbid obesity, OSA- BMI 48.  -Resume Home CPAP QHS - Monitor glucose while on steroids  Hodgkins Lymphoma- s/p chemotherapy 2017 - 2018 , follows with Dr. Delton Coombes.  Currently not on treatment.   DVT prophylaxis: Lovenox Code Status: Full code Family Communication: None at bedside. Disposition Plan: > 2 days Consults called: None Admission status: Inpatient, stepdown I certify that at the point of admission it is my clinical judgment that the patient will require inpatient hospital care spanning beyond 2 midnights from the point of admission due to high intensity of service, high risk for further deterioration and high frequency of surveillance required. The following factors support the patient status of inpatient: Acute respiratory failure requiring IV diuresis pending further work-up to elicit etiology.   Bethena Roys MD [facility]  10/27/2019, 8:17 PM

## 2019-10-27 NOTE — ED Triage Notes (Signed)
Increasing shortness of breath x 1 week

## 2019-10-28 ENCOUNTER — Encounter (HOSPITAL_COMMUNITY): Payer: Self-pay | Admitting: Internal Medicine

## 2019-10-28 ENCOUNTER — Inpatient Hospital Stay (HOSPITAL_COMMUNITY): Payer: 59

## 2019-10-28 DIAGNOSIS — C8100 Nodular lymphocyte predominant Hodgkin lymphoma, unspecified site: Secondary | ICD-10-CM

## 2019-10-28 DIAGNOSIS — I5023 Acute on chronic systolic (congestive) heart failure: Secondary | ICD-10-CM

## 2019-10-28 DIAGNOSIS — I340 Nonrheumatic mitral (valve) insufficiency: Secondary | ICD-10-CM

## 2019-10-28 DIAGNOSIS — J9601 Acute respiratory failure with hypoxia: Secondary | ICD-10-CM

## 2019-10-28 HISTORY — DX: Acute on chronic systolic (congestive) heart failure: I50.23

## 2019-10-28 LAB — BASIC METABOLIC PANEL
Anion gap: 10 (ref 5–15)
BUN: 16 mg/dL (ref 6–20)
CO2: 24 mmol/L (ref 22–32)
Calcium: 9.3 mg/dL (ref 8.9–10.3)
Chloride: 103 mmol/L (ref 98–111)
Creatinine, Ser: 0.68 mg/dL (ref 0.44–1.00)
GFR calc Af Amer: >60 mL/min (ref >60)
GFR calc non Af Amer: >60 mL/min (ref >60)
Glucose, Bld: 128 mg/dL — ABNORMAL HIGH (ref 70–99)
Potassium: 3.8 mmol/L (ref 3.5–5.1)
Sodium: 137 mmol/L (ref 135–145)

## 2019-10-28 LAB — CBC
HCT: 43.9 % (ref 36.0–46.0)
Hemoglobin: 13.4 g/dL (ref 12.0–15.0)
MCH: 24.9 pg — ABNORMAL LOW (ref 26.0–34.0)
MCHC: 30.5 g/dL (ref 30.0–36.0)
MCV: 81.6 fL (ref 80.0–100.0)
Platelets: 433 10*3/uL — ABNORMAL HIGH (ref 150–400)
RBC: 5.38 MIL/uL — ABNORMAL HIGH (ref 3.87–5.11)
RDW: 14.3 % (ref 11.5–15.5)
WBC: 16.5 10*3/uL — ABNORMAL HIGH (ref 4.0–10.5)
nRBC: 0 % (ref 0.0–0.2)

## 2019-10-28 LAB — ECHOCARDIOGRAM COMPLETE
Height: 64 in
Weight: 4599.68 oz

## 2019-10-28 LAB — HIV ANTIBODY (ROUTINE TESTING W REFLEX): HIV Screen 4th Generation wRfx: NONREACTIVE

## 2019-10-28 LAB — GLUCOSE, CAPILLARY: Glucose-Capillary: 155 mg/dL — ABNORMAL HIGH (ref 70–99)

## 2019-10-28 MED ORDER — LABETALOL HCL 5 MG/ML IV SOLN
10.0000 mg | Freq: Once | INTRAVENOUS | Status: AC
  Administered 2019-10-28: 10 mg via INTRAVENOUS
  Filled 2019-10-28: qty 4

## 2019-10-28 NOTE — Progress Notes (Signed)
PROGRESS NOTE  [name] [id number] DOB: [birth year] DOA: 10/27/2019 PCP: Patient, No Pcp Per  Brief History:  34 year old female with a history of hypertension, nonischemic cardiomyopathy, Hodgkin's lymphoma, morbid obesity, OSA, anxiety presenting with 71-month history of intermittent dyspnea on exertion that has progressively worsened over the past week.  The patient also endorses increasing lower extremity edema and abdominal girth.  She states that she does not particularly have orthopnea, but has been very compliant with her CPAP.  She states that her last weight at home was approximately 2 weeks prior to this admission and it was 285 pounds.  She feels like she has gained 10 pounds in the past month.  She denies any fevers, chills, chest pain,  nausea, vomiting, diarrhea, abdominal pain. Patient did have some coughing with blood-tinged sputum on 10/27/2019. She states she has been drinking "plenty of water" to flush out her kidneys. In the emergency department, the patient was afebrile hemodynamically stable with oxygen saturation 96% on 2 L.  BMP and CBC were essentially unremarkable.  CT angiogram chest was negative for pulmonary embolus but showed bilateral pleural effusions and bilateral patchy opacities.  There was stable bilateral axillary lymphadenopathy right greater than left.  The patient was started on IV furosemide and admitted for further evaluation and treatment.  Assessment/Plan: Acute respiratory failure with hypoxia -Initially placed on 2 L nasal cannula secondary to oxygen saturation 86% on room air -Secondary to CHF -Wean oxygen as tolerated for saturation greater 92% -3/11 CTA chest as discussed above  Acute on chronic systolic CHF -[id number] echo EF 45-50%, mild global HK -Daily weights -Accurate I's and O's -Continue IV furosemide 40 mg IV twice daily -Continue carvedilol and lisinopril -repeat Echo  Essential hypertension -Continue carvedilol  lisinopril  Hodgkin's lymphoma -Follow-up outpatient with medical oncology -Initially diagnosed August 2017 -Status post6cycles of R-CHOP -follows Dr. Delton Coombes  Morbid obesity -BMI 49.35 -Lifestyle modification  OSA/OHS -Continue nighttime CPAP    Disposition Plan: Patient From: Home D/C Place: Home - 2-3  Days when more euvoemic Barriers: Not Clinically Stable--remains fluid overloaded  Family Communication:   No Family at bedside  Consultants:  none  Code Status:  FUL  DVT Prophylaxis:  [place] Heparin / [place] Lovenox   Procedures: As Listed in Progress Note Above  Antibiotics: None       Subjective: Patient denies fevers, chills, headache, chest pain,  nausea, vomiting, diarrhea, abdominal pain, dysuria, hematuria, hematochezia, and melena.   Objective: Vitals:   10/28/19 0133 10/28/19 0500 10/28/19 0559 10/28/19 0816  BP: (!) 134/92  102/63 126/88  Pulse: 95  85 88  Resp:      Temp:   98.3 F (36.8 C)   TempSrc:   Axillary   SpO2:   99%   Weight:  130.4 kg    Height:        Intake/Output Summary (Last 24 hours) at 10/28/2019 1022 Last data filed at 10/28/2019 0900 Gross per 24 hour  Intake 240 ml  Output --  Net 240 ml   Weight change:  Exam:   General:  Pt is alert, follows commands appropriately, not in acute distress  HEENT: No icterus, No thrush, No neck mass, [place]/AT  Cardiovascular: RRR, S1/S2, no rubs, no gallops  Respiratory: diminished BS, bibasilar crackles, no wheeze  Abdomen: Soft/+BS, non tender, non distended, no guarding  Extremities: 1 + LE edema, No lymphangitis, No petechiae, No rashes, no synovitis  Data Reviewed: I have personally reviewed following labs and imaging studies Basic Metabolic Panel: Recent Labs  Lab 10/27/19 1631 10/28/19 0527  NA 135 137  K 3.7 3.8  CL 105 103  CO2 24 24  GLUCOSE 106* 128*  BUN 17 16  CREATININE 0.82 0.68  CALCIUM 9.3 9.3   Liver Function Tests: Recent Labs  Lab  10/27/19 1631  AST 25  ALT 30  ALKPHOS 40  BILITOT 0.9  PROT 7.0  ALBUMIN 3.6   No results for input(s): LIPASE, AMYLASE in the last 168 hours. No results for input(s): AMMONIA in the last 168 hours. Coagulation Profile: No results for input(s): INR, PROTIME in the last 168 hours. CBC: Recent Labs  Lab 10/27/19 1631 10/28/19 0527  WBC 7.8 16.5*  NEUTROABS 5.5  --   HGB 13.1 13.4  HCT 43.0 43.9  MCV 82.5 81.6  PLT 463* 433*   Cardiac Enzymes: No results for input(s): CKTOTAL, CKMB, CKMBINDEX, TROPONINI in the last 168 hours. BNP: Invalid input(s): POCBNP CBG: No results for input(s): GLUCAP in the last 168 hours. HbA1C: No results for input(s): HGBA1C in the last 72 hours. Urine analysis:    Component Value Date/Time   COLORURINE YELLOW 09/18/2018 1228   APPEARANCEUR CLEAR 09/18/2018 1228   LABSPEC 1.014 09/18/2018 1228   PHURINE 7.0 09/18/2018 1228   GLUCOSEU NEGATIVE 09/18/2018 1228   HGBUR NEGATIVE 09/18/2018 1228   BILIRUBINUR NEGATIVE 09/18/2018 1228   BILIRUBINUR neg 02/16/2013 1719   KETONESUR NEGATIVE 09/18/2018 1228   PROTEINUR NEGATIVE 09/18/2018 1228   UROBILINOGEN 0.2 11/01/2014 1937   NITRITE NEGATIVE 09/18/2018 1228   LEUKOCYTESUR NEGATIVE 09/18/2018 1228   Sepsis Labs: @LABRCNTIP (procalcitonin:4,lacticidven:4) ) Recent Results (from the past 240 hour(s))  Blood Culture (routine x 2)     Status: None (Preliminary result)   Collection Time: 10/27/19  4:30 PM   Specimen: BLOOD RIGHT FOREARM  Result Value Ref Range Status   Specimen Description BLOOD RIGHT FOREARM  Final   Special Requests   Final    BOTTLES DRAWN AEROBIC AND ANAEROBIC Blood Culture adequate volume   Culture   Final    NO GROWTH < 24 HOURS Performed at [facility], [address], [place], [place] [address]    Report Status PENDING  Incomplete  Blood Culture (routine x 2)     Status: None (Preliminary result)   Collection Time: 10/27/19  4:32 PM   Specimen: Left  Antecubital; Blood  Result Value Ref Range Status   Specimen Description LEFT ANTECUBITAL  Final   Special Requests   Final    BOTTLES DRAWN AEROBIC AND ANAEROBIC Blood Culture adequate volume   Culture   Final    NO GROWTH < 24 HOURS Performed at [facility], [address], [place], [place] [address]    Report Status PENDING  Incomplete  Respiratory Panel by RT PCR (Flu A&B, Covid) - Nasopharyngeal Swab     Status: None   Collection Time: 10/27/19  8:05 PM   Specimen: Nasopharyngeal Swab  Result Value Ref Range Status   SARS Coronavirus 2 by RT PCR NEGATIVE NEGATIVE Final    Comment: (NOTE) SARS-CoV-2 target nucleic acids are NOT DETECTED. The SARS-CoV-2 RNA is generally detectable in upper respiratoy specimens during the acute phase of infection. The lowest concentration of SARS-CoV-2 viral copies this assay can detect is 131 copies/mL. A negative result does not preclude SARS-Cov-2 infection and should not be used as the sole basis for treatment or other patient management  decisions. A negative result may occur with  improper specimen collection/handling, submission of specimen other than nasopharyngeal swab, presence of viral mutation(s) within the areas targeted by this assay, and inadequate number of viral copies (<131 copies/mL). A negative result must be combined with clinical observations, patient history, and epidemiological information. The expected result is Negative. Fact Sheet for Patients:  PinkCheek.be Fact Sheet for Healthcare Providers:  GravelBags.it This test is not yet ap proved or cleared by the [place] FDA and  has been authorized for detection and/or diagnosis of SARS-CoV-2 by FDA under an Emergency Use Authorization (EUA). This EUA will remain  in effect (meaning this test can be used) for the duration of the COVID-19 declaration under Section 564(b)(1) of the Act, 21 U.S.C. section  360bbb-3(b)(1), unless the authorization is terminated or revoked sooner.    Influenza A by PCR NEGATIVE NEGATIVE Final   Influenza B by PCR NEGATIVE NEGATIVE Final    Comment: (NOTE) The Xpert Xpress SARS-CoV-2/FLU/RSV assay is intended as an aid in  the diagnosis of influenza from Nasopharyngeal swab specimens and  should not be used as a sole basis for treatment. Nasal washings and  aspirates are unacceptable for Xpert Xpress SARS-CoV-2/FLU/RSV  testing. Fact Sheet for Patients: PinkCheek.be Fact Sheet for Healthcare Providers: GravelBags.it This test is not yet approved or cleared by the [place] FDA and  has been authorized for detection and/or diagnosis of SARS-CoV-2 by  FDA under an Emergency Use Authorization (EUA). This EUA will remain  in effect (meaning this test can be used) for the duration of the  Covid-19 declaration under Section 564(b)(1) of the Act, 21  U.S.C. section 360bbb-3(b)(1), unless the authorization is  terminated or revoked. Performed at [facility], [address], [place], [place] [address]      Scheduled Meds: . carvedilol  6.25 mg Oral BID WC  . Chlorhexidine Gluconate Cloth  6 each Topical Q0600  . enoxaparin (LOVENOX) injection  0.5 mg/kg Subcutaneous Q24H  . furosemide  40 mg Intravenous Q12H  . lisinopril  5 mg Oral Daily  . methylPREDNISolone (SOLU-MEDROL) injection  60 mg Intravenous Q12H  . potassium chloride SA  20 mEq Oral QHS   Continuous Infusions: . sodium chloride      Procedures/Studies: CT Angio Chest PE W and/or Wo Contrast  Result Date: 10/27/2019 CLINICAL DATA:  Shortness of breath for 1 week EXAM: CT ANGIOGRAPHY CHEST WITH CONTRAST TECHNIQUE: Multidetector CT imaging of the chest was performed using the standard protocol during bolus administration of intravenous contrast. Multiplanar CT image reconstructions and MIPs were obtained to evaluate the vascular  anatomy. CONTRAST:  161mL OMNIPAQUE IOHEXOL 350 MG/ML SOLN COMPARISON:  06/24/2019 FINDINGS: Cardiovascular: Thoracic aorta and its branches are within normal limits. No aneurysmal dilatation is seen. Mild cardiac enlargement is noted. The pulmonary artery is well visualized within normal branching pattern. No filling defects are identified to suggest pulmonary emboli. Mediastinum/Nodes: Thoracic inlet is within normal limits. Scattered small mediastinal lymph nodes are again seen similar to that noted on the prior exam. The previously seen axillary lymph nodes are stable as well. Esophagus is within normal limits. Lungs/Pleura: Bilateral pleural effusions are seen. Lungs are well aerated bilaterally. Patchy opacities are noted throughout both lungs similar to that seen on the prior chest x-ray. These are new from the prior CT examination. This likely represents combination of pulmonary edema and atypical pneumonia. Correlation with COVID testing is recommended. No focal parenchymal nodules are seen. Upper Abdomen: Visualized upper abdomen  is within normal limits. Musculoskeletal: Bony structures show no acute abnormality. Review of the MIP images confirms the above findings. IMPRESSION: No evidence of pulmonary emboli. Bilateral pleural effusions and diffuse bilateral airspace opacities. The effusions suggest diffuse pulmonary edema as an etiology for the airspace opacities although atypical pneumonia deserves consideration as well. Stable axillary adenopathy bilaterally more prominent on the right than the left. Electronically Signed   By: Inez Catalina M.D.   On: 10/27/2019 18:55   DG Chest Port 1 View  Result Date: 10/27/2019 CLINICAL DATA:  Cough and short of breath, CT 06/24/2019 EXAM: PORTABLE CHEST 1 VIEW COMPARISON:  09/17/2018 FINDINGS: Cardiomegaly with vascular congestion. Diffuse bilateral somewhat nodular appearing opacities. Possible small pleural effusions. Left-sided central venous port tip over  the SVC. No pneumothorax. IMPRESSION: 1. Cardiomegaly with central vascular congestion and possible small effusions. 2. Ill-defined somewhat nodular appearing pulmonary opacities, potentially due to atypical pneumonia or pulmonary nodules. CT is suggested for further evaluation Electronically Signed   By: Donavan Foil M.D.   On: 10/27/2019 18:02    Orson Eva, DO  [facility]  If 7PM-7AM, please contact night-coverage [email or web address] Password [email or web address] 10/28/2019, 10:22 AM   LOS: 1 day

## 2019-10-28 NOTE — Progress Notes (Signed)
*  PRELIMINARY RESULTS* Echocardiogram 2D Echocardiogram has been performed.  Leavy Cella 10/28/2019, 9:45 AM

## 2019-10-29 LAB — BASIC METABOLIC PANEL
Anion gap: 9 (ref 5–15)
BUN: 24 mg/dL — ABNORMAL HIGH (ref 6–20)
CO2: 27 mmol/L (ref 22–32)
Calcium: 9.4 mg/dL (ref 8.9–10.3)
Chloride: 103 mmol/L (ref 98–111)
Creatinine, Ser: 0.8 mg/dL (ref 0.44–1.00)
GFR calc Af Amer: >60 mL/min (ref >60)
GFR calc non Af Amer: >60 mL/min (ref >60)
Glucose, Bld: 115 mg/dL — ABNORMAL HIGH (ref 70–99)
Potassium: 3.8 mmol/L (ref 3.5–5.1)
Sodium: 139 mmol/L (ref 135–145)

## 2019-10-29 LAB — MAGNESIUM: Magnesium: 2 mg/dL (ref 1.7–2.4)

## 2019-10-29 MED ORDER — FUROSEMIDE 40 MG PO TABS
40.0000 mg | ORAL_TABLET | Freq: Every day | ORAL | Status: DC
  Administered 2019-10-30: 40 mg via ORAL
  Filled 2019-10-29: qty 1

## 2019-10-29 MED ORDER — SIMETHICONE 80 MG PO CHEW
80.0000 mg | CHEWABLE_TABLET | Freq: Four times a day (QID) | ORAL | Status: DC | PRN
  Administered 2019-10-29: 80 mg via ORAL
  Filled 2019-10-29: qty 1

## 2019-10-29 NOTE — Progress Notes (Signed)
PROGRESS NOTE  [name] [id number] DOB: [birth year] DOA: 10/27/2019 PCP: Patient, No Pcp Per  Brief History:  34 year old female with a history of hypertension, nonischemic cardiomyopathy, Hodgkin's lymphoma, morbid obesity, OSA, anxiety presenting with 70-month history of intermittent dyspnea on exertion that has progressively worsened over the past week.  The patient also endorses increasing lower extremity edema and abdominal girth.  She states that she does not particularly have orthopnea, but has been very compliant with her CPAP.  She states that her last weight at home was approximately 2 weeks prior to this admission and it was 285 pounds.  She feels like she has gained 10 pounds in the past month.  She denies any fevers, chills, chest pain,  nausea, vomiting, diarrhea, abdominal pain. Patient did have some coughing with blood-tinged sputum on 10/27/2019. She states she has been drinking "plenty of water" to flush out her kidneys. In the emergency department, the patient was afebrile hemodynamically stable with oxygen saturation 96% on 2 L.  BMP and CBC were essentially unremarkable.  CT angiogram chest was negative for pulmonary embolus but showed bilateral pleural effusions and bilateral patchy opacities.  There was stable bilateral axillary lymphadenopathy right greater than left.  The patient was started on IV furosemide and admitted for further evaluation and treatment.  Assessment/Plan: Acute respiratory failure with hypoxia -Initially placed on 2 L nasal cannula secondary to oxygen saturation 86% on room air -Secondary to CHF -Wean oxygen as tolerated for saturation greater 92% -3/11 CTA chest as discussed above  Acute on chronic systolic CHF -[id number] echo EF 45-50%, mild global HK -Daily weights--NEG 4 lbs -3/13 weight 282 -Accurate I's and O's -Continue IV furosemide 40 mg IV twice daily -Continue carvedilol and lisinopril -10/28/19 Echo EF45%, global  HK  Essential hypertension -Continue carvedilol and lisinopril  Hodgkin's lymphoma -Follow-up outpatient with medical oncology -Initially diagnosed August 2017 -Status post6cycles of R-CHOP -follows Dr. Delton Coombes  Morbid obesity -BMI 49.35 -Lifestyle modification  OSA/OHS -Continue nighttime CPAP    Disposition Plan: Patient From: Home D/C Place: Home - 3/14 if euvolemic Barriers: Not Clinically Stable--remains fluid overloaded  Family Communication:   spouse updated on phone 3/13  Consultants:  none  Code Status:  FULL  DVT Prophylaxis:  [place] Heparin / [place] Lovenox   Procedures: As Listed in Progress Note Above  Antibiotics: None      Subjective: Patient denies fevers, chills, headache, chest pain, dyspnea, nausea, vomiting, diarrhea, abdominal pain, dysuria, hematuria, hematochezia, and melena.   Objective: Vitals:   10/29/19 0813 10/29/19 0918 10/29/19 1340 10/29/19 1340  BP:  126/89 (!) 93/52 (!) 93/52  Pulse:  84 88 86  Resp:   20 20  Temp:  98.2 F (36.8 C) 98.6 F (37 C) 98.6 F (37 C)  TempSrc:  Oral Oral Oral  SpO2: 99% 99% 99% 99%  Weight:      Height:        Intake/Output Summary (Last 24 hours) at 10/29/2019 1730 Last data filed at 10/28/2019 2300 Gross per 24 hour  Intake 240 ml  Output --  Net 240 ml   Weight change: -0.675 kg Exam:   General:  Pt is alert, follows commands appropriately, not in acute distress  HEENT: No icterus, No thrush, No neck mass, [place]/AT  Cardiovascular: RRR, S1/S2, no rubs, no gallops  Respiratory: bibasilar crackles. No wheeze  Abdomen: Soft/+BS, non tender, non distended, no guarding  Extremities: trace LE edema,  No lymphangitis, No petechiae, No rashes, no synovitis   Data Reviewed: I have personally reviewed following labs and imaging studies Basic Metabolic Panel: Recent Labs  Lab 10/27/19 1631 10/28/19 0527 10/29/19 0717  NA 135 137 139  K 3.7 3.8 3.8  CL 105 103  103  CO2 24 24 27   GLUCOSE 106* 128* 115*  BUN 17 16 24*  CREATININE 0.82 0.68 0.80  CALCIUM 9.3 9.3 9.4  MG  --   --  2.0   Liver Function Tests: Recent Labs  Lab 10/27/19 1631  AST 25  ALT 30  ALKPHOS 40  BILITOT 0.9  PROT 7.0  ALBUMIN 3.6   No results for input(s): LIPASE, AMYLASE in the last 168 hours. No results for input(s): AMMONIA in the last 168 hours. Coagulation Profile: No results for input(s): INR, PROTIME in the last 168 hours. CBC: Recent Labs  Lab 10/27/19 1631 10/28/19 0527  WBC 7.8 16.5*  NEUTROABS 5.5  --   HGB 13.1 13.4  HCT 43.0 43.9  MCV 82.5 81.6  PLT 463* 433*   Cardiac Enzymes: No results for input(s): CKTOTAL, CKMB, CKMBINDEX, TROPONINI in the last 168 hours. BNP: Invalid input(s): POCBNP CBG: Recent Labs  Lab 10/28/19 1617  GLUCAP 155*   HbA1C: No results for input(s): HGBA1C in the last 72 hours. Urine analysis:    Component Value Date/Time   COLORURINE YELLOW 09/18/2018 1228   APPEARANCEUR CLEAR 09/18/2018 1228   LABSPEC 1.014 09/18/2018 1228   PHURINE 7.0 09/18/2018 1228   GLUCOSEU NEGATIVE 09/18/2018 1228   HGBUR NEGATIVE 09/18/2018 1228   BILIRUBINUR NEGATIVE 09/18/2018 1228   BILIRUBINUR neg 02/16/2013 1719   KETONESUR NEGATIVE 09/18/2018 1228   PROTEINUR NEGATIVE 09/18/2018 1228   UROBILINOGEN 0.2 11/01/2014 1937   NITRITE NEGATIVE 09/18/2018 1228   LEUKOCYTESUR NEGATIVE 09/18/2018 1228   Sepsis Labs: @LABRCNTIP (procalcitonin:4,lacticidven:4) ) Recent Results (from the past 240 hour(s))  Blood Culture (routine x 2)     Status: None (Preliminary result)   Collection Time: 10/27/19  4:30 PM   Specimen: BLOOD RIGHT FOREARM  Result Value Ref Range Status   Specimen Description BLOOD RIGHT FOREARM  Final   Special Requests   Final    BOTTLES DRAWN AEROBIC AND ANAEROBIC Blood Culture adequate volume   Culture   Final    NO GROWTH 2 DAYS Performed at [facility], [address] [place], [place] [address]     Report Status PENDING  Incomplete  Blood Culture (routine x 2)     Status: None (Preliminary result)   Collection Time: 10/27/19  4:32 PM   Specimen: Left Antecubital; Blood  Result Value Ref Range Status   Specimen Description LEFT ANTECUBITAL  Final   Special Requests   Final    BOTTLES DRAWN AEROBIC AND ANAEROBIC Blood Culture adequate volume   Culture   Final    NO GROWTH 2 DAYS Performed at [facility], [address] [place], [place] [address]    Report Status PENDING  Incomplete  Respiratory Panel by RT PCR (Flu A&B, Covid) - Nasopharyngeal Swab     Status: None   Collection Time: 10/27/19  8:05 PM   Specimen: Nasopharyngeal Swab  Result Value Ref Range Status   SARS Coronavirus 2 by RT PCR NEGATIVE NEGATIVE Final    Comment: (NOTE) SARS-CoV-2 target nucleic acids are NOT DETECTED. The SARS-CoV-2 RNA is generally detectable in upper respiratoy specimens during the acute phase of infection. The lowest concentration of SARS-CoV-2 viral copies this assay  can detect is 131 copies/mL. A negative result does not preclude SARS-Cov-2 infection and should not be used as the sole basis for treatment or other patient management decisions. A negative result may occur with  improper specimen collection/handling, submission of specimen other than nasopharyngeal swab, presence of viral mutation(s) within the areas targeted by this assay, and inadequate number of viral copies (<131 copies/mL). A negative result must be combined with clinical observations, patient history, and epidemiological information. The expected result is Negative. Fact Sheet for Patients:  PinkCheek.be Fact Sheet for Healthcare Providers:  GravelBags.it This test is not yet ap proved or cleared by the [place] FDA and  has been authorized for detection and/or diagnosis of SARS-CoV-2 by FDA under an Emergency Use Authorization (EUA). This EUA will remain   in effect (meaning this test can be used) for the duration of the COVID-19 declaration under Section 564(b)(1) of the Act, 21 U.S.C. section 360bbb-3(b)(1), unless the authorization is terminated or revoked sooner.    Influenza A by PCR NEGATIVE NEGATIVE Final   Influenza B by PCR NEGATIVE NEGATIVE Final    Comment: (NOTE) The Xpert Xpress SARS-CoV-2/FLU/RSV assay is intended as an aid in  the diagnosis of influenza from Nasopharyngeal swab specimens and  should not be used as a sole basis for treatment. Nasal washings and  aspirates are unacceptable for Xpert Xpress SARS-CoV-2/FLU/RSV  testing. Fact Sheet for Patients: PinkCheek.be Fact Sheet for Healthcare Providers: GravelBags.it This test is not yet approved or cleared by the [place] FDA and  has been authorized for detection and/or diagnosis of SARS-CoV-2 by  FDA under an Emergency Use Authorization (EUA). This EUA will remain  in effect (meaning this test can be used) for the duration of the  Covid-19 declaration under Section 564(b)(1) of the Act, 21  U.S.C. section 360bbb-3(b)(1), unless the authorization is  terminated or revoked. Performed at [facility], [address], [place], [place] [address]      Scheduled Meds: . carvedilol  6.25 mg Oral BID WC  . Chlorhexidine Gluconate Cloth  6 each Topical Q0600  . enoxaparin (LOVENOX) injection  0.5 mg/kg Subcutaneous Q24H  . furosemide  40 mg Intravenous Q12H  . [START ON 10/30/2019] furosemide  40 mg Oral Daily  . lisinopril  5 mg Oral Daily  . methylPREDNISolone (SOLU-MEDROL) injection  60 mg Intravenous Q12H  . potassium chloride SA  20 mEq Oral QHS   Continuous Infusions: . sodium chloride      Procedures/Studies: CT Angio Chest PE W and/or Wo Contrast  Result Date: 10/27/2019 CLINICAL DATA:  Shortness of breath for 1 week EXAM: CT ANGIOGRAPHY CHEST WITH CONTRAST TECHNIQUE: Multidetector CT imaging  of the chest was performed using the standard protocol during bolus administration of intravenous contrast. Multiplanar CT image reconstructions and MIPs were obtained to evaluate the vascular anatomy. CONTRAST:  17mL OMNIPAQUE IOHEXOL 350 MG/ML SOLN COMPARISON:  06/24/2019 FINDINGS: Cardiovascular: Thoracic aorta and its branches are within normal limits. No aneurysmal dilatation is seen. Mild cardiac enlargement is noted. The pulmonary artery is well visualized within normal branching pattern. No filling defects are identified to suggest pulmonary emboli. Mediastinum/Nodes: Thoracic inlet is within normal limits. Scattered small mediastinal lymph nodes are again seen similar to that noted on the prior exam. The previously seen axillary lymph nodes are stable as well. Esophagus is within normal limits. Lungs/Pleura: Bilateral pleural effusions are seen. Lungs are well aerated bilaterally. Patchy opacities are noted throughout both lungs similar to that seen on  the prior chest x-ray. These are new from the prior CT examination. This likely represents combination of pulmonary edema and atypical pneumonia. Correlation with COVID testing is recommended. No focal parenchymal nodules are seen. Upper Abdomen: Visualized upper abdomen is within normal limits. Musculoskeletal: Bony structures show no acute abnormality. Review of the MIP images confirms the above findings. IMPRESSION: No evidence of pulmonary emboli. Bilateral pleural effusions and diffuse bilateral airspace opacities. The effusions suggest diffuse pulmonary edema as an etiology for the airspace opacities although atypical pneumonia deserves consideration as well. Stable axillary adenopathy bilaterally more prominent on the right than the left. Electronically Signed   By: Inez Catalina M.D.   On: 10/27/2019 18:55   DG Chest Port 1 View  Result Date: 10/27/2019 CLINICAL DATA:  Cough and short of breath, CT 06/24/2019 EXAM: PORTABLE CHEST 1 VIEW  COMPARISON:  09/17/2018 FINDINGS: Cardiomegaly with vascular congestion. Diffuse bilateral somewhat nodular appearing opacities. Possible small pleural effusions. Left-sided central venous port tip over the SVC. No pneumothorax. IMPRESSION: 1. Cardiomegaly with central vascular congestion and possible small effusions. 2. Ill-defined somewhat nodular appearing pulmonary opacities, potentially due to atypical pneumonia or pulmonary nodules. CT is suggested for further evaluation Electronically Signed   By: Donavan Foil M.D.   On: 10/27/2019 18:02   ECHOCARDIOGRAM COMPLETE  Result Date: 10/28/2019    ECHOCARDIOGRAM REPORT   Patient Name:   [name] Date of Exam: 10/28/2019 Medical Rec #:  [id number]         Height:       64.0 in Accession #:    [id number]        Weight:       287.5 lb Date of Birth:  June 19, 1986         BSA:          2.283 m Patient Age:    52 years          BP:           126/88 mmHg Patient Gender: F                 HR:           88 bpm. Exam Location:  [facility] Procedure: 2D Echo Indications:    Acute Respiratory Insufficiency 518.82 / R06.89  History:        Patient has prior history of Echocardiogram examinations, most                 recent 09/18/2018. Signs/Symptoms:Syncope; Risk                 Factors:Hypertension and Non-Smoker. Acute respiratory failure ,                 Hodgkin lymphoma, nodular lymphocyte predominance , Abscess of                 breast, right.  Sonographer:    Leavy Cella RDCS (AE) Referring Phys: [place]  1. Left ventricular ejection fraction, by estimation, is approximately 45%. The left ventricle has mildly decreased function. The left ventricle demonstrates global hypokinesis. Left ventricular diastolic parameters are indeterminate.  2. Right ventricular systolic function is normal. The right ventricular size is normal. There is mildly elevated pulmonary artery systolic pressure. The estimated right ventricular systolic  pressure is 123XX123 mmHg.  3. Left atrial size was upper normal.  4. The mitral valve is grossly normal. Mild mitral valve regurgitation.  5. The aortic valve is  tricuspid. Aortic valve regurgitation is not visualized.  6. The inferior vena cava is normal in size with greater than 50% respiratory variability, suggesting right atrial pressure of 3 mmHg. FINDINGS  Left Ventricle: Left ventricular ejection fraction, by estimation, is 45%. The left ventricle has mildly decreased function. The left ventricle demonstrates global hypokinesis. The left ventricular internal cavity size was normal in size. There is borderline left ventricular hypertrophy. Left ventricular diastolic parameters are indeterminate. Right Ventricle: The right ventricular size is normal. No increase in right ventricular wall thickness. Right ventricular systolic function is normal. There is mildly elevated pulmonary artery systolic pressure. The tricuspid regurgitant velocity is 2.61  m/s, and with an assumed right atrial pressure of 3 mmHg, the estimated right ventricular systolic pressure is 123XX123 mmHg. Left Atrium: Left atrial size was upper normal. Right Atrium: Right atrial size was normal in size. Pericardium: There is no evidence of pericardial effusion. Mitral Valve: The mitral valve is grossly normal. Mild mitral valve regurgitation. Tricuspid Valve: The tricuspid valve is grossly normal. Tricuspid valve regurgitation is trivial. Aortic Valve: The aortic valve is tricuspid. Aortic valve regurgitation is not visualized. Pulmonic Valve: The pulmonic valve was grossly normal. Pulmonic valve regurgitation is trivial. Aorta: The aortic root is normal in size and structure. Venous: The inferior vena cava is normal in size with greater than 50% respiratory variability, suggesting right atrial pressure of 3 mmHg. IAS/Shunts: No atrial level shunt detected by color flow Doppler.  LEFT VENTRICLE PLAX 2D LVIDd:         4.86 cm  Diastology LVIDs:          3.62 cm  LV e' lateral:   6.96 cm/s LV PW:         1.14 cm  LV E/e' lateral: 18.1 LV IVS:        0.88 cm  LV e' medial:    6.31 cm/s LVOT diam:     2.00 cm  LV E/e' medial:  20.0 LVOT Area:     3.14 cm  RIGHT VENTRICLE RV S prime:     12.90 cm/s TAPSE (M-mode): 2.2 cm LEFT ATRIUM           Index       RIGHT ATRIUM           Index LA diam:      4.80 cm 2.10 cm/m  RA Area:     20.40 cm LA Vol (A2C): 61.1 ml 26.77 ml/m RA Volume:   64.60 ml  28.30 ml/m LA Vol (A4C): 80.3 ml 35.18 ml/m   AORTA Ao Root diam: 2.80 cm MITRAL VALVE                TRICUSPID VALVE MV Area (PHT): 4.80 cm     TR Peak grad:   27.2 mmHg MV Decel Time: 158 msec     TR Vmax:        261.00 cm/s MR Peak grad: 91.4 mmHg MR Mean grad: 62.0 mmHg     SHUNTS MR Vmax:      478.00 cm/s   Systemic Diam: 2.00 cm MR Vmean:     365.0 cm/s MV E velocity: 126.00 cm/s MV A velocity: 34.00 cm/s MV E/A ratio:  3.71 Rozann Lesches MD Electronically signed by Rozann Lesches MD Signature Date/Time: 10/28/2019/11:22:30 AM    Final     Orson Eva, DO  [facility]  If 7PM-7AM, please contact night-coverage [email or web address] Password [email or web address] 10/29/2019, 5:30 PM   LOS: 2 days

## 2019-10-30 LAB — BASIC METABOLIC PANEL
Anion gap: 8 (ref 5–15)
BUN: 30 mg/dL — ABNORMAL HIGH (ref 6–20)
CO2: 25 mmol/L (ref 22–32)
Calcium: 9.3 mg/dL (ref 8.9–10.3)
Chloride: 103 mmol/L (ref 98–111)
Creatinine, Ser: 0.75 mg/dL (ref 0.44–1.00)
GFR calc Af Amer: >60 mL/min (ref >60)
GFR calc non Af Amer: >60 mL/min (ref >60)
Glucose, Bld: 128 mg/dL — ABNORMAL HIGH (ref 70–99)
Potassium: 4 mmol/L (ref 3.5–5.1)
Sodium: 136 mmol/L (ref 135–145)

## 2019-10-30 LAB — MAGNESIUM: Magnesium: 2 mg/dL (ref 1.7–2.4)

## 2019-10-30 MED ORDER — FUROSEMIDE 40 MG PO TABS: 40.0000 mg | ORAL_TABLET | Freq: Every day | ORAL | 1 refills | Status: DC

## 2019-10-30 NOTE — Discharge Summary (Signed)
Physician Discharge Summary  [name] [id number] DOB: [birth year] DOA: 10/27/2019  PCP: Patient, No Pcp Per  Admit date: 10/27/2019 Discharge date: 10/30/2019  Admitted From: Home Disposition:  Home   Recommendations for Outpatient Follow-up:  1. Follow up with PCP in 1-2 weeks 2. Please obtain BMP/CBC in one week    Discharge Condition: Stable CODE STATUS: FULL Diet recommendation: Heart Healthy   Brief/Interim Summary: 34 year old female with a history of hypertension, nonischemic cardiomyopathy, Hodgkin's lymphoma, morbid obesity, OSA, anxiety presenting with 22-month history of intermittent dyspnea on exertion that has progressively worsened over the past week. The patient also endorses increasing lower extremity edema and abdominal girth. She states that she does not particularly have orthopnea, but has been very compliant with her CPAP. She states that her last weight at home was approximately 2 weeks prior to this admission and it was 285 pounds. She feels like she has gained 10 pounds in the past month. She denies any fevers, chills, chest pain, nausea, vomiting, diarrhea, abdominal pain. Patient did have some coughing with blood-tinged sputum on 10/27/2019.She states she has been drinking "plenty of water" to flush out her kidneys. In the emergency department, the patient was afebrile hemodynamically stable with oxygen saturation 96% on 2 L. BMP and CBC were essentially unremarkable. CT angiogram chest was negative for pulmonary embolus but showed bilateral pleural effusions and bilateral patchy opacities. There was stable bilateral axillary lymphadenopathy right greater than left. The patient was started on IV furosemide and admitted for further evaluation and treatment.  Discharge Diagnoses:  Acute respiratory failure with hypoxia -Initially placed on 2 L nasal cannula secondary to oxygen saturation 86% on room air -Secondary to CHF -Wean oxygen as  tolerated for saturation greater 92% -3/11 CTA chest as discussed above -stable on RA at time of d/c  Acute on chronic systolic CHF -[id number] echo EF 45-50%, mild global HK -Daily weights--NEG 5.8 lbs -3/14 discharge weight 281.7 lb (standing) -Accurate I's and O's -Continue IV furosemide 40 mg IV twice daily>>>lasix 40 mg po daily -Continue carvedilol and lisinopril -10/28/19 Echo EF45%, global HK  Essential hypertension -Continue carvedilol and lisinopril  Hodgkin's lymphoma -Follow-up outpatient with medical oncology -Initially diagnosed August 2017 -Status post6cycles of R-CHOP -follows Dr. Delton Coombes  Morbid obesity -BMI 48.42 -Lifestyle modification  OSA/OHS -Continue nighttime CPAP   Discharge Instructions   Allergies as of 10/30/2019   No Known Allergies     Medication List    STOP taking these medications   ibuprofen 200 MG tablet Commonly known as: ADVIL     TAKE these medications   albuterol 108 (90 Base) MCG/ACT inhaler Commonly known as: VENTOLIN HFA Inhale 1-2 puffs into the lungs every 6 (six) hours as needed for wheezing or shortness of breath.   carvedilol 6.25 MG tablet Commonly known as: COREG Take 1 tablet (6.25 mg total) by mouth 2 (two) times daily with a meal.   COVID-19 mRNA vaccine (Moderna) 100 MCG/0.5ML Susp Inject 0.5 mLs into the muscle once.   furosemide 40 MG tablet Commonly known as: LASIX Take 1 tablet (40 mg total) by mouth daily. Start taking on: October 31, 2019   lisinopril 5 MG tablet Commonly known as: ZESTRIL Take 1 tablet (5 mg total) by mouth daily.   potassium chloride SA 20 MEQ tablet Commonly known as: KLOR-CON Take 1 tablet (20 mEq total) by mouth daily. What changed: when to take this   Vitamin D (Ergocalciferol) 1.25 MG (50000 UNIT) Caps capsule Commonly known as:  DRISDOL Take 50,000 Units by mouth every Monday. Takes on Mondays.       No Known  Allergies  Consultations:  none   Procedures/Studies: CT Angio Chest PE W and/or Wo Contrast  Result Date: 10/27/2019 CLINICAL DATA:  Shortness of breath for 1 week EXAM: CT ANGIOGRAPHY CHEST WITH CONTRAST TECHNIQUE: Multidetector CT imaging of the chest was performed using the standard protocol during bolus administration of intravenous contrast. Multiplanar CT image reconstructions and MIPs were obtained to evaluate the vascular anatomy. CONTRAST:  162mL OMNIPAQUE IOHEXOL 350 MG/ML SOLN COMPARISON:  06/24/2019 FINDINGS: Cardiovascular: Thoracic aorta and its branches are within normal limits. No aneurysmal dilatation is seen. Mild cardiac enlargement is noted. The pulmonary artery is well visualized within normal branching pattern. No filling defects are identified to suggest pulmonary emboli. Mediastinum/Nodes: Thoracic inlet is within normal limits. Scattered small mediastinal lymph nodes are again seen similar to that noted on the prior exam. The previously seen axillary lymph nodes are stable as well. Esophagus is within normal limits. Lungs/Pleura: Bilateral pleural effusions are seen. Lungs are well aerated bilaterally. Patchy opacities are noted throughout both lungs similar to that seen on the prior chest x-ray. These are new from the prior CT examination. This likely represents combination of pulmonary edema and atypical pneumonia. Correlation with COVID testing is recommended. No focal parenchymal nodules are seen. Upper Abdomen: Visualized upper abdomen is within normal limits. Musculoskeletal: Bony structures show no acute abnormality. Review of the MIP images confirms the above findings. IMPRESSION: No evidence of pulmonary emboli. Bilateral pleural effusions and diffuse bilateral airspace opacities. The effusions suggest diffuse pulmonary edema as an etiology for the airspace opacities although atypical pneumonia deserves consideration as well. Stable axillary adenopathy bilaterally more  prominent on the right than the left. Electronically Signed   By: Inez Catalina M.D.   On: 10/27/2019 18:55   DG Chest Port 1 View  Result Date: 10/27/2019 CLINICAL DATA:  Cough and short of breath, CT 06/24/2019 EXAM: PORTABLE CHEST 1 VIEW COMPARISON:  09/17/2018 FINDINGS: Cardiomegaly with vascular congestion. Diffuse bilateral somewhat nodular appearing opacities. Possible small pleural effusions. Left-sided central venous port tip over the SVC. No pneumothorax. IMPRESSION: 1. Cardiomegaly with central vascular congestion and possible small effusions. 2. Ill-defined somewhat nodular appearing pulmonary opacities, potentially due to atypical pneumonia or pulmonary nodules. CT is suggested for further evaluation Electronically Signed   By: Donavan Foil M.D.   On: 10/27/2019 18:02   ECHOCARDIOGRAM COMPLETE  Result Date: 10/28/2019    ECHOCARDIOGRAM REPORT   Patient Name:   [name] Date of Exam: 10/28/2019 Medical Rec #:  [id number]         Height:       64.0 in Accession #:    [id number]        Weight:       287.5 lb Date of Birth:  1985-09-09         BSA:          2.283 m Patient Age:    69 years          BP:           126/88 mmHg Patient Gender: F                 HR:           88 bpm. Exam Location:  [facility] Procedure: 2D Echo Indications:    Acute Respiratory Insufficiency 518.82 / R06.89  History:  Patient has prior history of Echocardiogram examinations, most                 recent 09/18/2018. Signs/Symptoms:Syncope; Risk                 Factors:Hypertension and Non-Smoker. Acute respiratory failure ,                 Hodgkin lymphoma, nodular lymphocyte predominance , Abscess of                 breast, right.  Sonographer:    Leavy Cella RDCS (AE) Referring Phys: [place]  1. Left ventricular ejection fraction, by estimation, is approximately 45%. The left ventricle has mildly decreased function. The left ventricle demonstrates global hypokinesis. Left  ventricular diastolic parameters are indeterminate.  2. Right ventricular systolic function is normal. The right ventricular size is normal. There is mildly elevated pulmonary artery systolic pressure. The estimated right ventricular systolic pressure is 123XX123 mmHg.  3. Left atrial size was upper normal.  4. The mitral valve is grossly normal. Mild mitral valve regurgitation.  5. The aortic valve is tricuspid. Aortic valve regurgitation is not visualized.  6. The inferior vena cava is normal in size with greater than 50% respiratory variability, suggesting right atrial pressure of 3 mmHg. FINDINGS  Left Ventricle: Left ventricular ejection fraction, by estimation, is 45%. The left ventricle has mildly decreased function. The left ventricle demonstrates global hypokinesis. The left ventricular internal cavity size was normal in size. There is borderline left ventricular hypertrophy. Left ventricular diastolic parameters are indeterminate. Right Ventricle: The right ventricular size is normal. No increase in right ventricular wall thickness. Right ventricular systolic function is normal. There is mildly elevated pulmonary artery systolic pressure. The tricuspid regurgitant velocity is 2.61  m/s, and with an assumed right atrial pressure of 3 mmHg, the estimated right ventricular systolic pressure is 123XX123 mmHg. Left Atrium: Left atrial size was upper normal. Right Atrium: Right atrial size was normal in size. Pericardium: There is no evidence of pericardial effusion. Mitral Valve: The mitral valve is grossly normal. Mild mitral valve regurgitation. Tricuspid Valve: The tricuspid valve is grossly normal. Tricuspid valve regurgitation is trivial. Aortic Valve: The aortic valve is tricuspid. Aortic valve regurgitation is not visualized. Pulmonic Valve: The pulmonic valve was grossly normal. Pulmonic valve regurgitation is trivial. Aorta: The aortic root is normal in size and structure. Venous: The inferior vena cava is  normal in size with greater than 50% respiratory variability, suggesting right atrial pressure of 3 mmHg. IAS/Shunts: No atrial level shunt detected by color flow Doppler.  LEFT VENTRICLE PLAX 2D LVIDd:         4.86 cm  Diastology LVIDs:         3.62 cm  LV e' lateral:   6.96 cm/s LV PW:         1.14 cm  LV E/e' lateral: 18.1 LV IVS:        0.88 cm  LV e' medial:    6.31 cm/s LVOT diam:     2.00 cm  LV E/e' medial:  20.0 LVOT Area:     3.14 cm  RIGHT VENTRICLE RV S prime:     12.90 cm/s TAPSE (M-mode): 2.2 cm LEFT ATRIUM           Index       RIGHT ATRIUM           Index LA diam:  4.80 cm 2.10 cm/m  RA Area:     20.40 cm LA Vol (A2C): 61.1 ml 26.77 ml/m RA Volume:   64.60 ml  28.30 ml/m LA Vol (A4C): 80.3 ml 35.18 ml/m   AORTA Ao Root diam: 2.80 cm MITRAL VALVE                TRICUSPID VALVE MV Area (PHT): 4.80 cm     TR Peak grad:   27.2 mmHg MV Decel Time: 158 msec     TR Vmax:        261.00 cm/s MR Peak grad: 91.4 mmHg MR Mean grad: 62.0 mmHg     SHUNTS MR Vmax:      478.00 cm/s   Systemic Diam: 2.00 cm MR Vmean:     365.0 cm/s MV E velocity: 126.00 cm/s MV A velocity: 34.00 cm/s MV E/A ratio:  3.71 Rozann Lesches MD Electronically signed by Rozann Lesches MD Signature Date/Time: 10/28/2019/11:22:30 AM    Final          Discharge Exam: Vitals:   10/29/19 2233 10/30/19 0618  BP: 125/87 110/70  Pulse: 86 73  Resp: 16 18  Temp: 97.7 F (36.5 C) (!) 97.2 F (36.2 C)  SpO2: 100% 100%   Vitals:   10/29/19 1340 10/29/19 2233 10/30/19 0618 10/30/19 0620  BP: (!) 93/52 125/87 110/70   Pulse: 86 86 73   Resp: 20 16 18    Temp: 98.6 F (37 C) 97.7 F (36.5 C) (!) 97.2 F (36.2 C)   TempSrc: Oral Oral Axillary   SpO2: 99% 100% 100%   Weight:    128 kg  Height:        General: Pt is alert, awake, not in acute distress Cardiovascular: RRR, S1/S2 +, no rubs, no gallops Respiratory: CTA bilaterally, no wheezing, no rhonchi Abdominal: Soft, NT, ND, bowel sounds + Extremities: no  edema, no cyanosis   The results of significant diagnostics from this hospitalization (including imaging, microbiology, ancillary and laboratory) are listed below for reference.    Significant Diagnostic Studies: CT Angio Chest PE W and/or Wo Contrast  Result Date: 10/27/2019 CLINICAL DATA:  Shortness of breath for 1 week EXAM: CT ANGIOGRAPHY CHEST WITH CONTRAST TECHNIQUE: Multidetector CT imaging of the chest was performed using the standard protocol during bolus administration of intravenous contrast. Multiplanar CT image reconstructions and MIPs were obtained to evaluate the vascular anatomy. CONTRAST:  139mL OMNIPAQUE IOHEXOL 350 MG/ML SOLN COMPARISON:  06/24/2019 FINDINGS: Cardiovascular: Thoracic aorta and its branches are within normal limits. No aneurysmal dilatation is seen. Mild cardiac enlargement is noted. The pulmonary artery is well visualized within normal branching pattern. No filling defects are identified to suggest pulmonary emboli. Mediastinum/Nodes: Thoracic inlet is within normal limits. Scattered small mediastinal lymph nodes are again seen similar to that noted on the prior exam. The previously seen axillary lymph nodes are stable as well. Esophagus is within normal limits. Lungs/Pleura: Bilateral pleural effusions are seen. Lungs are well aerated bilaterally. Patchy opacities are noted throughout both lungs similar to that seen on the prior chest x-ray. These are new from the prior CT examination. This likely represents combination of pulmonary edema and atypical pneumonia. Correlation with COVID testing is recommended. No focal parenchymal nodules are seen. Upper Abdomen: Visualized upper abdomen is within normal limits. Musculoskeletal: Bony structures show no acute abnormality. Review of the MIP images confirms the above findings. IMPRESSION: No evidence of pulmonary emboli. Bilateral pleural effusions and diffuse bilateral airspace opacities. The effusions  suggest diffuse  pulmonary edema as an etiology for the airspace opacities although atypical pneumonia deserves consideration as well. Stable axillary adenopathy bilaterally more prominent on the right than the left. Electronically Signed   By: Inez Catalina M.D.   On: 10/27/2019 18:55   DG Chest Port 1 View  Result Date: 10/27/2019 CLINICAL DATA:  Cough and short of breath, CT 06/24/2019 EXAM: PORTABLE CHEST 1 VIEW COMPARISON:  09/17/2018 FINDINGS: Cardiomegaly with vascular congestion. Diffuse bilateral somewhat nodular appearing opacities. Possible small pleural effusions. Left-sided central venous port tip over the SVC. No pneumothorax. IMPRESSION: 1. Cardiomegaly with central vascular congestion and possible small effusions. 2. Ill-defined somewhat nodular appearing pulmonary opacities, potentially due to atypical pneumonia or pulmonary nodules. CT is suggested for further evaluation Electronically Signed   By: Donavan Foil M.D.   On: 10/27/2019 18:02   ECHOCARDIOGRAM COMPLETE  Result Date: 10/28/2019    ECHOCARDIOGRAM REPORT   Patient Name:   [name] Date of Exam: 10/28/2019 Medical Rec #:  [id number]         Height:       64.0 in Accession #:    [id number]        Weight:       287.5 lb Date of Birth:  [birth year]         BSA:          2.283 m Patient Age:    13 years          BP:           126/88 mmHg Patient Gender: F                 HR:           88 bpm. Exam Location:  [facility] Procedure: 2D Echo Indications:    Acute Respiratory Insufficiency 518.82 / R06.89  History:        Patient has prior history of Echocardiogram examinations, most                 recent 09/18/2018. Signs/Symptoms:Syncope; Risk                 Factors:Hypertension and Non-Smoker. Acute respiratory failure ,                 Hodgkin lymphoma, nodular lymphocyte predominance , Abscess of                 breast, right.  Sonographer:    Leavy Cella RDCS (AE) Referring Phys: [place]  1. Left ventricular  ejection fraction, by estimation, is approximately 45%. The left ventricle has mildly decreased function. The left ventricle demonstrates global hypokinesis. Left ventricular diastolic parameters are indeterminate.  2. Right ventricular systolic function is normal. The right ventricular size is normal. There is mildly elevated pulmonary artery systolic pressure. The estimated right ventricular systolic pressure is 123XX123 mmHg.  3. Left atrial size was upper normal.  4. The mitral valve is grossly normal. Mild mitral valve regurgitation.  5. The aortic valve is tricuspid. Aortic valve regurgitation is not visualized.  6. The inferior vena cava is normal in size with greater than 50% respiratory variability, suggesting right atrial pressure of 3 mmHg. FINDINGS  Left Ventricle: Left ventricular ejection fraction, by estimation, is 45%. The left ventricle has mildly decreased function. The left ventricle demonstrates global hypokinesis. The left ventricular internal cavity size was normal in size. There is borderline left ventricular hypertrophy. Left ventricular diastolic parameters  are indeterminate. Right Ventricle: The right ventricular size is normal. No increase in right ventricular wall thickness. Right ventricular systolic function is normal. There is mildly elevated pulmonary artery systolic pressure. The tricuspid regurgitant velocity is 2.61  m/s, and with an assumed right atrial pressure of 3 mmHg, the estimated right ventricular systolic pressure is 123XX123 mmHg. Left Atrium: Left atrial size was upper normal. Right Atrium: Right atrial size was normal in size. Pericardium: There is no evidence of pericardial effusion. Mitral Valve: The mitral valve is grossly normal. Mild mitral valve regurgitation. Tricuspid Valve: The tricuspid valve is grossly normal. Tricuspid valve regurgitation is trivial. Aortic Valve: The aortic valve is tricuspid. Aortic valve regurgitation is not visualized. Pulmonic Valve: The  pulmonic valve was grossly normal. Pulmonic valve regurgitation is trivial. Aorta: The aortic root is normal in size and structure. Venous: The inferior vena cava is normal in size with greater than 50% respiratory variability, suggesting right atrial pressure of 3 mmHg. IAS/Shunts: No atrial level shunt detected by color flow Doppler.  LEFT VENTRICLE PLAX 2D LVIDd:         4.86 cm  Diastology LVIDs:         3.62 cm  LV e' lateral:   6.96 cm/s LV PW:         1.14 cm  LV E/e' lateral: 18.1 LV IVS:        0.88 cm  LV e' medial:    6.31 cm/s LVOT diam:     2.00 cm  LV E/e' medial:  20.0 LVOT Area:     3.14 cm  RIGHT VENTRICLE RV S prime:     12.90 cm/s TAPSE (M-mode): 2.2 cm LEFT ATRIUM           Index       RIGHT ATRIUM           Index LA diam:      4.80 cm 2.10 cm/m  RA Area:     20.40 cm LA Vol (A2C): 61.1 ml 26.77 ml/m RA Volume:   64.60 ml  28.30 ml/m LA Vol (A4C): 80.3 ml 35.18 ml/m   AORTA Ao Root diam: 2.80 cm MITRAL VALVE                TRICUSPID VALVE MV Area (PHT): 4.80 cm     TR Peak grad:   27.2 mmHg MV Decel Time: 158 msec     TR Vmax:        261.00 cm/s MR Peak grad: 91.4 mmHg MR Mean grad: 62.0 mmHg     SHUNTS MR Vmax:      478.00 cm/s   Systemic Diam: 2.00 cm MR Vmean:     365.0 cm/s MV E velocity: 126.00 cm/s MV A velocity: 34.00 cm/s MV E/A ratio:  3.71 Rozann Lesches MD Electronically signed by Rozann Lesches MD Signature Date/Time: 10/28/2019/11:22:30 AM    Final      Microbiology: Recent Results (from the past 240 hour(s))  Blood Culture (routine x 2)     Status: None (Preliminary result)   Collection Time: 10/27/19  4:30 PM   Specimen: BLOOD RIGHT FOREARM  Result Value Ref Range Status   Specimen Description BLOOD RIGHT FOREARM  Final   Special Requests   Final    BOTTLES DRAWN AEROBIC AND ANAEROBIC Blood Culture adequate volume   Culture   Final    NO GROWTH 3 DAYS Performed at [facility], [address], [place], [place] [address]    Report  Status PENDING  Incomplete   Blood Culture (routine x 2)     Status: None (Preliminary result)   Collection Time: 10/27/19  4:32 PM   Specimen: Left Antecubital; Blood  Result Value Ref Range Status   Specimen Description LEFT ANTECUBITAL  Final   Special Requests   Final    BOTTLES DRAWN AEROBIC AND ANAEROBIC Blood Culture adequate volume   Culture   Final    NO GROWTH 3 DAYS Performed at [facility], [address], [place], [place] [address]    Report Status PENDING  Incomplete  Respiratory Panel by RT PCR (Flu A&B, Covid) - Nasopharyngeal Swab     Status: None   Collection Time: 10/27/19  8:05 PM   Specimen: Nasopharyngeal Swab  Result Value Ref Range Status   SARS Coronavirus 2 by RT PCR NEGATIVE NEGATIVE Final    Comment: (NOTE) SARS-CoV-2 target nucleic acids are NOT DETECTED. The SARS-CoV-2 RNA is generally detectable in upper respiratoy specimens during the acute phase of infection. The lowest concentration of SARS-CoV-2 viral copies this assay can detect is 131 copies/mL. A negative result does not preclude SARS-Cov-2 infection and should not be used as the sole basis for treatment or other patient management decisions. A negative result may occur with  improper specimen collection/handling, submission of specimen other than nasopharyngeal swab, presence of viral mutation(s) within the areas targeted by this assay, and inadequate number of viral copies (<131 copies/mL). A negative result must be combined with clinical observations, patient history, and epidemiological information. The expected result is Negative. Fact Sheet for Patients:  PinkCheek.be Fact Sheet for Healthcare Providers:  GravelBags.it This test is not yet ap proved or cleared by the [place] FDA and  has been authorized for detection and/or diagnosis of SARS-CoV-2 by FDA under an Emergency Use Authorization (EUA). This EUA will remain  in effect (meaning this test  can be used) for the duration of the COVID-19 declaration under Section 564(b)(1) of the Act, 21 U.S.C. section 360bbb-3(b)(1), unless the authorization is terminated or revoked sooner.    Influenza A by PCR NEGATIVE NEGATIVE Final   Influenza B by PCR NEGATIVE NEGATIVE Final    Comment: (NOTE) The Xpert Xpress SARS-CoV-2/FLU/RSV assay is intended as an aid in  the diagnosis of influenza from Nasopharyngeal swab specimens and  should not be used as a sole basis for treatment. Nasal washings and  aspirates are unacceptable for Xpert Xpress SARS-CoV-2/FLU/RSV  testing. Fact Sheet for Patients: PinkCheek.be Fact Sheet for Healthcare Providers: GravelBags.it This test is not yet approved or cleared by the [place] FDA and  has been authorized for detection and/or diagnosis of SARS-CoV-2 by  FDA under an Emergency Use Authorization (EUA). This EUA will remain  in effect (meaning this test can be used) for the duration of the  Covid-19 declaration under Section 564(b)(1) of the Act, 21  U.S.C. section 360bbb-3(b)(1), unless the authorization is  terminated or revoked. Performed at [facility], [address], [place], [place] [address]      Labs: Basic Metabolic Panel: Recent Labs  Lab 10/27/19 1631 10/27/19 1631 10/28/19 0527 10/28/19 0527 10/29/19 0717 10/30/19 0735  NA 135  --  137  --  139 136  K 3.7   < > 3.8   < > 3.8 4.0  CL 105  --  103  --  103 103  CO2 24  --  24  --  27 25  GLUCOSE 106*  --  128*  --  115* 128*  BUN 17  --  16  --  24* 30*  CREATININE 0.82  --  0.68  --  0.80 0.75  CALCIUM 9.3  --  9.3  --  9.4 9.3  MG  --   --   --   --  2.0 2.0   < > = values in this interval not displayed.   Liver Function Tests: Recent Labs  Lab 10/27/19 1631  AST 25  ALT 30  ALKPHOS 40  BILITOT 0.9  PROT 7.0  ALBUMIN 3.6   No results for input(s): LIPASE, AMYLASE in the last 168 hours. No results for  input(s): AMMONIA in the last 168 hours. CBC: Recent Labs  Lab 10/27/19 1631 10/28/19 0527  WBC 7.8 16.5*  NEUTROABS 5.5  --   HGB 13.1 13.4  HCT 43.0 43.9  MCV 82.5 81.6  PLT 463* 433*   Cardiac Enzymes: No results for input(s): CKTOTAL, CKMB, CKMBINDEX, TROPONINI in the last 168 hours. BNP: Invalid input(s): POCBNP CBG: Recent Labs  Lab 10/28/19 1617  GLUCAP 155*    Time coordinating discharge:  36 minutes  Signed:  Orson Eva, DO [facility] Pager: [phone] 10/30/2019, 9:54 AM

## 2019-10-30 NOTE — Progress Notes (Signed)
Nsg Discharge Note  Admit Date:  10/27/2019 Discharge date: 10/30/2019   Raford Pitcher to be D/C'd home  per MD order.  AVS completed.  Copy for chart, and copy for patient signed, and dated. Patient/caregiver able to verbalize understanding.  Discharge Medication: Allergies as of 10/30/2019   No Known Allergies     Medication List    STOP taking these medications   ibuprofen 200 MG tablet Commonly known as: ADVIL     TAKE these medications   albuterol 108 (90 Base) MCG/ACT inhaler Commonly known as: VENTOLIN HFA Inhale 1-2 puffs into the lungs every 6 (six) hours as needed for wheezing or shortness of breath.   carvedilol 6.25 MG tablet Commonly known as: COREG Take 1 tablet (6.25 mg total) by mouth 2 (two) times daily with a meal.   COVID-19 mRNA vaccine (Moderna) 100 MCG/0.5ML Susp Inject 0.5 mLs into the muscle once.   furosemide 40 MG tablet Commonly known as: LASIX Take 1 tablet (40 mg total) by mouth daily. Start taking on: October 31, 2019   lisinopril 5 MG tablet Commonly known as: ZESTRIL Take 1 tablet (5 mg total) by mouth daily.   potassium chloride SA 20 MEQ tablet Commonly known as: KLOR-CON Take 1 tablet (20 mEq total) by mouth daily. What changed: when to take this   Vitamin D (Ergocalciferol) 1.25 MG (50000 UNIT) Caps capsule Commonly known as: DRISDOL Take 50,000 Units by mouth every Monday. Takes on Mondays.       Discharge Assessment: Vitals:   10/29/19 2233 10/30/19 0618  BP: 125/87 110/70  Pulse: 86 73  Resp: 16 18  Temp: 97.7 F (36.5 C) (!) 97.2 F (36.2 C)  SpO2: 100% 100%   Skin clean, dry and intact without evidence of skin break down, no evidence of skin tears noted. IV catheter discontinued intact. Site without signs and symptoms of complications - no redness or edema noted at insertion site, patient denies c/o pain - only slight tenderness at site.  Dressing with slight pressure applied.  D/c  Instructions-Education: Discharge instructions given to patient/family with verbalized understanding. D/c education completed with patient/family including follow up instructions, medication list, d/c activities limitations if indicated, with other d/c instructions as indicated by MD - patient able to verbalize understanding, all questions fully answered. Patient instructed to return to ED, call 911, or call MD for any changes in condition.  Patient escorted via [place], and D/C home via private auto.  Venita Sheffield, RN 10/30/2019 11:07 AM

## 2019-10-30 NOTE — Plan of Care (Signed)

## 2019-11-01 LAB — CULTURE, BLOOD (ROUTINE X 2)
Culture: NO GROWTH
Culture: NO GROWTH
Special Requests: ADEQUATE
Special Requests: ADEQUATE

## 2019-11-02 ENCOUNTER — Other Ambulatory Visit (HOSPITAL_COMMUNITY): Payer: Self-pay | Admitting: *Deleted

## 2019-11-02 DIAGNOSIS — E876 Hypokalemia: Secondary | ICD-10-CM

## 2019-11-02 MED ORDER — POTASSIUM CHLORIDE CRYS ER 20 MEQ PO TBCR: 20.0000 meq | EXTENDED_RELEASE_TABLET | Freq: Every day | ORAL | 2 refills | Status: DC

## 2019-11-09 ENCOUNTER — Other Ambulatory Visit (HOSPITAL_COMMUNITY)
Admission: RE | Admit: 2019-11-09 | Discharge: 2019-11-09 | Disposition: A | Discharge status: Home / Self Care | Payer: 59 | Source: Ambulatory Visit | Attending: Cardiovascular Disease | Admitting: Cardiovascular Disease

## 2019-11-09 ENCOUNTER — Other Ambulatory Visit: Payer: Self-pay

## 2019-11-09 ENCOUNTER — Ambulatory Visit: Payer: 59 | Admitting: Cardiovascular Disease

## 2019-11-09 ENCOUNTER — Telehealth: Payer: Self-pay

## 2019-11-09 ENCOUNTER — Encounter: Payer: Self-pay | Admitting: Cardiovascular Disease

## 2019-11-09 ENCOUNTER — Ambulatory Visit (INDEPENDENT_AMBULATORY_CARE_PROVIDER_SITE_OTHER): Payer: 59 | Admitting: Cardiovascular Disease

## 2019-11-09 VITALS — BP 104/71 | HR 89 | Ht 64.0 in | Wt 279.0 lb

## 2019-11-09 DIAGNOSIS — I428 Other cardiomyopathies: Secondary | ICD-10-CM

## 2019-11-09 DIAGNOSIS — I10 Essential (primary) hypertension: Secondary | ICD-10-CM

## 2019-11-09 DIAGNOSIS — G4733 Obstructive sleep apnea (adult) (pediatric): Secondary | ICD-10-CM

## 2019-11-09 DIAGNOSIS — Z79899 Other long term (current) drug therapy: Secondary | ICD-10-CM | POA: Insufficient documentation

## 2019-11-09 DIAGNOSIS — I5022 Chronic systolic (congestive) heart failure: Secondary | ICD-10-CM

## 2019-11-09 DIAGNOSIS — Z9289 Personal history of other medical treatment: Secondary | ICD-10-CM | POA: Diagnosis not present

## 2019-11-09 DIAGNOSIS — Z9989 Dependence on other enabling machines and devices: Secondary | ICD-10-CM

## 2019-11-09 LAB — BASIC METABOLIC PANEL
Anion gap: 8 (ref 5–15)
BUN: 19 mg/dL (ref 6–20)
CO2: 25 mmol/L (ref 22–32)
Calcium: 9.7 mg/dL (ref 8.9–10.3)
Chloride: 104 mmol/L (ref 98–111)
Creatinine, Ser: 0.78 mg/dL (ref 0.44–1.00)
GFR calc Af Amer: >60 mL/min (ref >60)
GFR calc non Af Amer: >60 mL/min (ref >60)
Glucose, Bld: 98 mg/dL (ref 70–99)
Potassium: 4.2 mmol/L (ref 3.5–5.1)
Sodium: 137 mmol/L (ref 135–145)

## 2019-11-09 LAB — MAGNESIUM: Magnesium: 2.1 mg/dL (ref 1.7–2.4)

## 2019-11-09 NOTE — Telephone Encounter (Signed)

## 2019-11-09 NOTE — Progress Notes (Signed)
SUBJECTIVE: Patient is a 34 year old woman whom I evaluated once before in February 2020.  She has a history of nonischemic cardiomyopathy, hypertension, Hodgkin's lymphoma, obstructive sleep apnea, and anxiety and depression.  She was recently hospitalized for acute systolic heart failure at [facility].  Echocardiogram demonstrated LVEF 45%.  I reviewed the chest CT performed on 10/27/2019 which demonstrated bilateral pleural effusions and CHF.  There was stable axillary adenopathy bilaterally more prominent on the right than the left.  She was discharged on Lasix 40 mg daily.  She is also on carvedilol and lisinopril.  I reviewed labs dated 10/30/2019: BUN 30, creatinine 0.75, sodium 136, potassium 4, magnesium 2.  I personally reviewed the ECG performed on 10/27/2019 which demonstrated sinus tachycardia, 115 bpm.  She feels much better and denies chest pain, palpitations, shortness of breath.  She sometimes experiences a whooshing sensation in her left ear when she bends over or when lying down at night.  She also has some lower abdominal cramping.  She denies ringing in her years.  She denies orthopnea and paroxysmal nocturnal dyspnea.  She is trying to lose weight and is restricting herself to 1300 cal.  Social history: She has a 86-year-old daughter and a 75 year old son who is a Administrator, arts at [employer].  Review of Systems: As per "subjective", otherwise negative.  No Known Allergies  Current Outpatient Medications  Medication Sig Dispense Refill  . albuterol (VENTOLIN HFA) 108 (90 Base) MCG/ACT inhaler Inhale 1-2 puffs into the lungs every 6 (six) hours as needed for wheezing or shortness of breath.     . carvedilol (COREG) 6.25 MG tablet Take 1 tablet (6.25 mg total) by mouth 2 (two) times daily with a meal. 60 tablet 1  . COVID-19 mRNA vaccine, Moderna, 100 MCG/0.5ML SUSP Inject 0.5 mLs into the muscle once.    . furosemide (LASIX) 40 MG tablet Take 1  tablet (40 mg total) by mouth daily. 30 tablet 1  . lisinopril (PRINIVIL,ZESTRIL) 5 MG tablet Take 1 tablet (5 mg total) by mouth daily. 90 tablet 3  . potassium chloride SA (KLOR-CON) 20 MEQ tablet Take 1 tablet (20 mEq total) by mouth daily. 30 tablet 2  . Vitamin D, Ergocalciferol, (DRISDOL) 50000 units CAPS capsule Take 50,000 Units by mouth every Monday. Takes on Mondays.     No current facility-administered medications for this visit.    Past Medical History:  Diagnosis Date  . Acute on chronic systolic CHF (congestive heart failure) ([place]) 10/28/2019  . Anxiety   . Asthma    had since childhood; has not had to use inhaler or nebulizer for 8-9 years. Been doing well.  . Cervical lymphadenopathy    left groin lymph node, axillary lymph nodes:  seen on PET scan 02/2018  . Dyspnea   . Headache(784.0)    usually controlled c Tylenol when not pregnant, but more painful headaches  during preg. due to hypertension.  . Hidradenitis suppurativa   . 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 Hypertension December 2011   Been on [place] since Dec. 2011  . Iron deficiency 01/13/2017  . Lymphoma, Hodgkin's ([place])    Stage IV  . Sleep apnea     Past Surgical History:  Procedure Laterality Date  . ABDOMINAL HYSTERECTOMY  05/2017  . ADENOIDECTOMY     age 53  . BREAST RECONSTRUCTION  December 2001   breast reduction at age 87  . FULGURATION OF BLADDER TUMOR N/A  07/23/2016   Procedure: CYSTOSCOPY, BIOPSY AND FULGURATION OF BLADDER;  Surgeon: Cleon Gustin, MD;  Location: [facility];  Service: Urology;  Laterality: N/A;  . MASS EXCISION Right 04/07/2016   Procedure: EXCISION/BIOPSY SOFT TISSUE MASS RIGHT THIGH;  Surgeon: Aviva Signs, MD;  Location: [facility];  Service: General;  Laterality: Right;  . port a cath placed    . PORTACATH PLACEMENT Left 04/28/2016   Procedure: INSERTION PORT-A-CATH;  Surgeon: Aviva Signs, MD;  Location: [facility];  Service: General;  Laterality:  Left;    Social History   Socioeconomic History  . Marital status: Married    Spouse name: Not on file  . Number of children: Not on file  . Years of education: Not on file  . Highest education level: Not on file  Occupational History  . Not on file  Tobacco Use  . Smoking status: Never Smoker  . Smokeless tobacco: Never Used  Substance and Sexual Activity  . Alcohol use: Yes    Alcohol/week: 1.0 standard drinks    Types: 1 Glasses of wine per week  . Drug use: No  . Sexual activity: Yes    Birth control/protection: I.U.D.  Other Topics Concern  . Not on file  Social History Narrative  . Not on file   Social Determinants of Health   Financial Resource Strain:   . Difficulty of Paying Living Expenses:   Food Insecurity:   . Worried About Charity fundraiser in the Last Year:   . Arboriculturist in the Last Year:   Transportation Needs:   . Film/video editor (Medical):   Marland Kitchen Lack of Transportation (Non-Medical):   Physical Activity:   . Days of Exercise per Week:   . Minutes of Exercise per Session:   Stress:   . Feeling of Stress :   Social Connections:   . Frequency of Communication with Friends and Family:   . Frequency of Social Gatherings with Friends and Family:   . Attends Religious Services:   . Active Member of Clubs or Organizations:   . Attends Archivist Meetings:   Marland Kitchen Marital Status:   Intimate Partner Violence:   . Fear of Current or Ex-Partner:   . Emotionally Abused:   Marland Kitchen Physically Abused:   . Sexually Abused:     Barbarann Ehlers, RN was present throughout the entirety of the encounter.  Vitals:   11/09/19 0820  BP: 104/71  Pulse: 89  Weight: 279 lb (126.6 kg)  Height: 5\' 4"  (1.626 m)    Wt Readings from Last 3 Encounters:  11/09/19 279 lb (126.6 kg)  10/30/19 282 lb 1.6 oz (128 kg)  06/28/19 291 lb (132 kg)     PHYSICAL EXAM General: NAD HEENT: Normal. Neck: No JVD, no thyromegaly. Lungs: Clear to auscultation  bilaterally with normal respiratory effort. CV: Regular rate and rhythm, normal S1/S2, no S3/S4, no murmur. No pretibial or periankle edema.  No carotid bruit.   Abdomen: Soft, nontender, no distention.  Neurologic: Alert and oriented.  Psych: Normal affect. Skin: Normal. Musculoskeletal: No gross deformities.      Labs: Lab Results  Component Value Date/Time   K 4.0 10/30/2019 07:35 AM   BUN 30 (H) 10/30/2019 07:35 AM   CREATININE 0.75 10/30/2019 07:35 AM   CREATININE 0.78 02/16/2013 05:21 PM   ALT 30 10/27/2019 04:31 PM   TSH 2.621 09/18/2018 06:58 AM   TSH 1.480 06/12/2015 04:30 PM   HGB 13.4 10/28/2019 05:27  AM     Lipids: Lab Results  Component Value Date/Time   LDLCALC 92 02/16/2013 05:21 PM   CHOL 142 02/16/2013 05:21 PM   TRIG 64 10/27/2019 04:31 PM   TRIG 72 02/16/2013 05:21 PM   HDL 36 (L) 02/16/2013 05:21 PM      Echocardiogram 10/28/2019:  1. Left ventricular ejection fraction, by estimation, is approximately  45%. The left ventricle has mildly decreased function. The left ventricle  demonstrates global hypokinesis. Left ventricular diastolic parameters are  indeterminate.  2. Right ventricular systolic function is normal. The right ventricular  size is normal. There is mildly elevated pulmonary artery systolic  pressure. The estimated right ventricular systolic pressure is [id number] mmHg.  3. Left atrial size was upper normal.  4. The mitral valve is grossly normal. Mild mitral valve regurgitation.  5. The aortic valve is tricuspid. Aortic valve regurgitation is not  visualized.  6. The inferior vena cava is normal in size with greater than 50%  respiratory variability, suggesting right atrial pressure of 3 mmHg.    ASSESSMENT AND PLAN:  1.    Nonischemic cardiomyopathy/chronic systolic heart failure: This was diagnosed by an echocardiogram performed on 09/18/2018, LVEF 45 to 50%, with diffuse hypokinesis.  Most recent echocardiogram in March 2021  demonstrated LVEF 45%.  She was recently hospitalized for acute on chronic systolic heart failure. She is euvolemic.    Cardiomyopathy may be due to the doxorubicin component of the R-CHOP regimen which she received for Hodgkin's lymphoma between 05/05/2016 through 08/19/2016.  Continue carvedilol, lisinopril, and Lasix 40 mg daily.  I encouraged her to weigh herself daily and educated her on the importance of a low-sodium diet.  2.  Hypertension: Blood pressure is normal.  No changes to therapy.  3.  Obstructive sleep apnea: CPAP use encouraged.  4.  Morbid obesity: She is trying to lose weight by restricting herself to 1300 cal daily.   Disposition: Follow up virtual visit 3 months  Time spent: 40 minutes, of which greater than 50% was spent reviewing symptoms, relevant blood tests and studies, and discussing management plan with the patient.    Kate Sable, M.D., F.A.C.C.

## 2019-11-09 NOTE — Patient Instructions (Addendum)
Medication Instructions:  Your physician recommends that you continue on your current medications as directed. Please refer to the Current Medication list given to you today.  *If you need a refill on your cardiac medications before your next appointment, please call your pharmacy*   Lab Work: BMET , magnesium  If you have labs (blood work) drawn today and your tests are completely normal, you will receive your results only by: Marland Kitchen MyChart Message (if you have MyChart) OR . A paper copy in the mail If you have any lab test that is abnormal or we need to change your treatment, we will call you to review the results.   Testing/Procedures: None today   Follow-Up: At [facility], you and your health needs are our priority.  As part of our continuing mission to provide you with exceptional heart care, we have created designated Provider Care Teams.  These Care Teams include your primary Cardiologist (physician) and Advanced Practice Providers (APPs -  Physician Assistants and Nurse Practitioners) who all work together to provide you with the care you need, when you need it.  We recommend signing up for the patient portal called "MyChart".  Sign up information is provided on this After Visit Summary.  MyChart is used to connect with patients for Virtual Visits (Telemedicine).  Patients are able to view lab/test results, encounter notes, upcoming appointments, etc.  Non-urgent messages can be sent to your provider as well.   To learn more about what you can do with MyChart, go to [email or web address].    Your next appointment:   3 month(s)  The format for your next appointment:   Virtual Visit   Provider:   Kate Sable, MD   Other Instructions   None      Thank you for choosing [facility] !

## 2019-12-16 ENCOUNTER — Other Ambulatory Visit (HOSPITAL_COMMUNITY): Payer: Self-pay | Admitting: Nurse Practitioner

## 2019-12-22 ENCOUNTER — Inpatient Hospital Stay (HOSPITAL_COMMUNITY): Payer: 59 | Attending: Hematology

## 2019-12-22 ENCOUNTER — Other Ambulatory Visit: Payer: Self-pay

## 2019-12-22 ENCOUNTER — Ambulatory Visit (HOSPITAL_COMMUNITY)
Admission: RE | Admit: 2019-12-22 | Discharge: 2019-12-22 | Disposition: A | Discharge status: Home / Self Care | Payer: 59 | Source: Ambulatory Visit | Attending: Hematology | Admitting: Hematology

## 2019-12-22 DIAGNOSIS — C8100 Nodular lymphocyte predominant Hodgkin lymphoma, unspecified site: Secondary | ICD-10-CM | POA: Diagnosis present

## 2019-12-22 LAB — COMPREHENSIVE METABOLIC PANEL
ALT: 21 U/L (ref 0–44)
AST: 16 U/L (ref 15–41)
Albumin: 3.9 g/dL (ref 3.5–5.0)
Alkaline Phosphatase: 41 U/L (ref 38–126)
Anion gap: 7 (ref 5–15)
BUN: 16 mg/dL (ref 6–20)
CO2: 27 mmol/L (ref 22–32)
Calcium: 9.7 mg/dL (ref 8.9–10.3)
Chloride: 103 mmol/L (ref 98–111)
Creatinine, Ser: 0.8 mg/dL (ref 0.44–1.00)
GFR calc Af Amer: >60 mL/min (ref >60)
GFR calc non Af Amer: >60 mL/min (ref >60)
Glucose, Bld: 91 mg/dL (ref 70–99)
Potassium: 4.1 mmol/L (ref 3.5–5.1)
Sodium: 137 mmol/L (ref 135–145)
Total Bilirubin: 0.4 mg/dL (ref 0.3–1.2)
Total Protein: 7.5 g/dL (ref 6.5–8.1)

## 2019-12-22 LAB — CBC WITH DIFFERENTIAL/PLATELET
Abs Immature Granulocytes: 0.01 10*3/uL (ref 0.00–0.07)
Basophils Absolute: 0 10*3/uL (ref 0.0–0.1)
Basophils Relative: 1 %
Eosinophils Absolute: 0.4 10*3/uL (ref 0.0–0.5)
Eosinophils Relative: 5 %
HCT: 43.7 % (ref 36.0–46.0)
Hemoglobin: 13.2 g/dL (ref 12.0–15.0)
Immature Granulocytes: 0 %
Lymphocytes Relative: 22 %
Lymphs Abs: 1.6 10*3/uL (ref 0.7–4.0)
MCH: 24.9 pg — ABNORMAL LOW (ref 26.0–34.0)
MCHC: 30.2 g/dL (ref 30.0–36.0)
MCV: 82.3 fL (ref 80.0–100.0)
Monocytes Absolute: 0.8 10*3/uL (ref 0.1–1.0)
Monocytes Relative: 11 %
Neutro Abs: 4.3 10*3/uL (ref 1.7–7.7)
Neutrophils Relative %: 61 %
Platelets: 371 10*3/uL (ref 150–400)
RBC: 5.31 MIL/uL — ABNORMAL HIGH (ref 3.87–5.11)
RDW: 15.5 % (ref 11.5–15.5)
WBC: 7.1 10*3/uL (ref 4.0–10.5)
nRBC: 0 % (ref 0.0–0.2)

## 2019-12-22 LAB — SEDIMENTATION RATE: Sed Rate: 9 mm/hr (ref 0–22)

## 2019-12-22 LAB — LACTATE DEHYDROGENASE: LDH: 96 U/L — ABNORMAL LOW (ref 98–192)

## 2019-12-22 MED ORDER — IOHEXOL 300 MG/ML  SOLN
100.0000 mL | Freq: Once | INTRAMUSCULAR | Status: AC | PRN
  Administered 2019-12-22: 11:00:00 100 mL via INTRAVENOUS

## 2019-12-29 ENCOUNTER — Ambulatory Visit (HOSPITAL_COMMUNITY): Payer: 59 | Admitting: Hematology

## 2020-01-01 ENCOUNTER — Other Ambulatory Visit: Payer: Self-pay | Admitting: Cardiovascular Disease

## 2020-01-02 ENCOUNTER — Other Ambulatory Visit: Payer: Self-pay | Admitting: Cardiovascular Disease

## 2020-01-09 ENCOUNTER — Other Ambulatory Visit: Payer: Self-pay

## 2020-01-09 MED ORDER — FUROSEMIDE 40 MG PO TABS: 40.0000 mg | ORAL_TABLET | Freq: Every day | ORAL | 1 refills | Status: DC

## 2020-01-18 ENCOUNTER — Other Ambulatory Visit: Payer: Self-pay

## 2020-01-18 ENCOUNTER — Inpatient Hospital Stay (HOSPITAL_COMMUNITY): Payer: 59 | Attending: Hematology | Admitting: Hematology

## 2020-01-18 VITALS — BP 114/77 | HR 89 | Temp 97.5°F | Resp 18 | Wt 282.0 lb

## 2020-01-18 DIAGNOSIS — I110 Hypertensive heart disease with heart failure: Secondary | ICD-10-CM | POA: Diagnosis not present

## 2020-01-18 DIAGNOSIS — Z79899 Other long term (current) drug therapy: Secondary | ICD-10-CM | POA: Diagnosis not present

## 2020-01-18 DIAGNOSIS — J45909 Unspecified asthma, uncomplicated: Secondary | ICD-10-CM | POA: Diagnosis not present

## 2020-01-18 DIAGNOSIS — C8100 Nodular lymphocyte predominant Hodgkin lymphoma, unspecified site: Secondary | ICD-10-CM

## 2020-01-18 DIAGNOSIS — F419 Anxiety disorder, unspecified: Secondary | ICD-10-CM | POA: Insufficient documentation

## 2020-01-18 DIAGNOSIS — L732 Hidradenitis suppurativa: Secondary | ICD-10-CM | POA: Insufficient documentation

## 2020-01-18 DIAGNOSIS — R630 Anorexia: Secondary | ICD-10-CM | POA: Diagnosis not present

## 2020-01-18 DIAGNOSIS — G4730 Sleep apnea, unspecified: Secondary | ICD-10-CM | POA: Insufficient documentation

## 2020-01-18 DIAGNOSIS — R590 Localized enlarged lymph nodes: Secondary | ICD-10-CM | POA: Insufficient documentation

## 2020-01-18 DIAGNOSIS — R5383 Other fatigue: Secondary | ICD-10-CM | POA: Diagnosis not present

## 2020-01-18 DIAGNOSIS — I5022 Chronic systolic (congestive) heart failure: Secondary | ICD-10-CM | POA: Diagnosis not present

## 2020-01-18 DIAGNOSIS — M5136 Other intervertebral disc degeneration, lumbar region: Secondary | ICD-10-CM | POA: Diagnosis not present

## 2020-01-18 DIAGNOSIS — M47816 Spondylosis without myelopathy or radiculopathy, lumbar region: Secondary | ICD-10-CM | POA: Insufficient documentation

## 2020-01-18 NOTE — Progress Notes (Signed)
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  CURRENT THERAPY: Observation  INTERVAL HISTORY:  Ms. [name], a 34 y.o. female, returns for routine follow-up for her nodular lymphocyte predominant Hodgkin's lymphoma. [name] was last seen on 06/28/2019.  Since her last visit, she was diagnosed with CHF and was admitted to the hospital from 10/27/2019 to 10/30/2019. She notes that they diuresed about 6 lbs of fluid off of her. She is now on lasix and has a follow up with cardiology.   Her night sweats have improved since she was placed on lasix and she believes that her CHF may have something to do with that as she had difficulty breathing at night even with her CPAP on.   She is now fully vaccinated against COVID-19. She got her first COVID shot in her right arm and her second shot in her left arm, which could explain her recent enlargement of her axillary lymph nodes.    REVIEW OF SYSTEMS:  Review of Systems  Constitutional: Positive for appetite change and fatigue (moderate).  All other systems reviewed and are negative.   PAST MEDICAL/SURGICAL HISTORY:  Past Medical History:  Diagnosis Date  . Acute on chronic systolic CHF (congestive heart failure) ([facility]) 10/28/2019  . Anxiety   . Asthma    had since childhood; has not had to use inhaler or nebulizer for 8-9 years. Been doing well.  . Cervical lymphadenopathy    left groin lymph node, axillary lymph nodes:  seen on PET scan 02/2018  . Dyspnea   . Headache(784.0)    usually controlled c Tylenol when not pregnant, but more painful headaches  during preg. due to hypertension.  . Hidradenitis suppurativa   . Hodgkin lymphoma, nodular lymphocyte predominance ([facility])  04/15/2016  . Hypertension December 2011   Been on [place] since Dec. 2011  . Iron deficiency 01/13/2017  . Lymphoma, Hodgkin's ([facility])    Stage IV  . Sleep apnea    Past Surgical History:  Procedure Laterality Date  . ABDOMINAL HYSTERECTOMY  05/2017  . ADENOIDECTOMY     age 32  . BREAST RECONSTRUCTION  December 2001   breast reduction at age 77  . FULGURATION OF BLADDER TUMOR N/A 07/23/2016   Procedure: CYSTOSCOPY, BIOPSY AND FULGURATION OF BLADDER;  Surgeon: Cleon Gustin, MD;  Location: [facility];  Service: Urology;  Laterality: N/A;  . MASS EXCISION Right 04/07/2016   Procedure: EXCISION/BIOPSY SOFT TISSUE MASS RIGHT THIGH;  Surgeon: Aviva Signs, MD;  Location: [facility];  Service: General;  Laterality: Right;  . port a cath placed    . PORTACATH PLACEMENT Left 04/28/2016   Procedure: INSERTION PORT-A-CATH;  Surgeon: Aviva Signs, MD;  Location: [facility];  Service: General;  Laterality: Left;    SOCIAL HISTORY:  Social History   Socioeconomic History  . Marital status: Married    Spouse name: Not on file  . Number of children: Not on file  . Years of education: Not on file  . Highest education level: Not on file  Occupational History  . Not on file  Tobacco Use  . Smoking status: Never Smoker  . Smokeless tobacco: Never Used  Substance and Sexual Activity  . Alcohol use: Yes    Alcohol/week: 1.0 standard  drinks    Types: 1 Glasses of wine per week  . Drug use: No  . Sexual activity: Yes    Birth control/protection: I.U.D.  Other Topics Concern  . Not on file  Social History Narrative  . Not on file   Social Determinants of Health   Financial Resource Strain:   . Difficulty of Paying Living Expenses:   Food Insecurity:   . Worried About Charity fundraiser in the Last Year:   . Arboriculturist in the Last Year:   Transportation Needs:   . Film/video editor (Medical):   Marland Kitchen Lack of Transportation (Non-Medical):   Physical Activity:   . Days of Exercise per  Week:   . Minutes of Exercise per Session:   Stress:   . Feeling of Stress :   Social Connections:   . Frequency of Communication with Friends and Family:   . Frequency of Social Gatherings with Friends and Family:   . Attends Religious Services:   . Active Member of Clubs or Organizations:   . Attends Archivist Meetings:   Marland Kitchen Marital Status:   Intimate Partner Violence:   . Fear of Current or Ex-Partner:   . Emotionally Abused:   Marland Kitchen Physically Abused:   . Sexually Abused:     FAMILY HISTORY:  Family History  Problem Relation Age of Onset  . Hypertension Mother   . Cancer Mother   . Hypertension Father   . Heart disease Maternal Grandmother   . Diabetes Paternal Grandmother     CURRENT MEDICATIONS:  Current Outpatient Medications  Medication Sig Dispense Refill  . albuterol (VENTOLIN HFA) 108 (90 Base) MCG/ACT inhaler Inhale 1-2 puffs into the lungs every 6 (six) hours as needed for wheezing or shortness of breath.     . carvedilol (COREG) 6.25 MG tablet Take 1 tablet (6.25 mg total) by mouth 2 (two) times daily with a meal. 60 tablet 1  . COVID-19 mRNA vaccine, Moderna, 100 MCG/0.5ML SUSP Inject 0.5 mLs into the muscle once.    . furosemide (LASIX) 40 MG tablet Take 1 tablet (40 mg total) by mouth daily. 30 tablet 1  . lisinopril (ZESTRIL) 5 MG tablet TAKE 1 TABLET ONCE DAILY. 90 tablet 1  . potassium chloride SA (KLOR-CON) 20 MEQ tablet Take 1 tablet (20 mEq total) by mouth daily. 30 tablet 2  . Vitamin D, Ergocalciferol, (DRISDOL) 50000 units CAPS capsule Take 50,000 Units by mouth every Monday. Takes on Mondays.     No current facility-administered medications for this visit.    ALLERGIES:  No Known Allergies  PHYSICAL EXAM:  Performance status (ECOG): 0 - Asymptomatic  There were no vitals filed for this visit. Wt Readings from Last 3 Encounters:  11/09/19 279 lb (126.6 kg)  10/30/19 282 lb 1.6 oz (128 kg)  06/28/19 291 lb (132 kg)   Physical  Exam Vitals reviewed.  Constitutional:      Appearance: Normal appearance.  Cardiovascular:     Rate and Rhythm: Normal rate and regular rhythm.  Pulmonary:     Effort: Pulmonary effort is normal.     Breath sounds: Normal breath sounds.  Abdominal:     Palpations: Abdomen is soft. There is no mass.     Tenderness: There is no abdominal tenderness.  Musculoskeletal:     Right lower leg: No edema.     Left lower leg: No edema.  Lymphadenopathy:     Upper Body:     Right  upper body: Axillary adenopathy present.  Neurological:     General: No focal deficit present.     Mental Status: She is alert and oriented to person, place, and time.  Psychiatric:        Mood and Affect: Mood normal.        Behavior: Behavior normal.     LABORATORY DATA:  I have reviewed the labs as listed.  CBC Latest Ref Rng & Units 12/22/2019 10/28/2019 10/27/2019  WBC 4.0 - 10.5 K/uL 7.1 16.5(H) 7.8  Hemoglobin 12.0 - 15.0 g/dL 13.2 13.4 13.1  Hematocrit 36.0 - 46.0 % 43.7 43.9 43.0  Platelets 150 - 400 K/uL 371 433(H) 463(H)   CMP Latest Ref Rng & Units 12/22/2019 11/09/2019 10/30/2019  Glucose 70 - 99 mg/dL 91 98 128(H)  BUN 6 - 20 mg/dL 16 19 30(H)  Creatinine 0.44 - 1.00 mg/dL 0.80 0.78 0.75  Sodium 135 - 145 mmol/L 137 137 136  Potassium 3.5 - 5.1 mmol/L 4.1 4.2 4.0  Chloride 98 - 111 mmol/L 103 104 103  CO2 22 - 32 mmol/L 27 25 25   Calcium 8.9 - 10.3 mg/dL 9.7 9.7 9.3  Total Protein 6.5 - 8.1 g/dL 7.5 - -  Total Bilirubin 0.3 - 1.2 mg/dL 0.4 - -  Alkaline Phos 38 - 126 U/L 41 - -  AST 15 - 41 U/L 16 - -  ALT 0 - 44 U/L 21 - -      Component Value Date/Time   RBC 5.31 (H) 12/22/2019 0951   MCV 82.3 12/22/2019 0951   MCH 24.9 (L) 12/22/2019 0951   MCHC 30.2 12/22/2019 0951   RDW 15.5 12/22/2019 0951   LYMPHSABS 1.6 12/22/2019 0951   MONOABS 0.8 12/22/2019 0951   EOSABS 0.4 12/22/2019 0951   BASOSABS 0.0 12/22/2019 0951    DIAGNOSTIC IMAGING:  I have independently reviewed the scans and  discussed with the patient. CT Chest W Contrast  Result Date: 12/22/2019 CLINICAL DATA:  Restaging of nodular lymphocyte-predominant Hodgkin's lymphoma. Follow up of right axillary lymph node seen on prior CT. Prior chemotherapy. EXAM: CT CHEST, ABDOMEN, AND PELVIS WITH CONTRAST TECHNIQUE: Multidetector CT imaging of the chest, abdomen and pelvis was performed following the standard protocol during bolus administration of intravenous contrast. CONTRAST:  130mL OMNIPAQUE IOHEXOL 300 MG/ML  SOLN COMPARISON:  10/27/2019 and 06/24/2019 FINDINGS: CT CHEST FINDINGS Cardiovascular: Left Port-A-Cath tip: SVC. Mild cardiomegaly. Mediastinum/Nodes: Bilateral axillary adenopathy. Left axillary node 1.9 cm in short axis on image 9/2, formerly 1.0 cm 10/27/2019. Right axillary lymph node 2.0 cm in short axis on 15/2, previously not included on imaging due to field of view. However, back on 06/24/2019 this measured 2.2 cm in diameter. Multiple additional prominent axillary nodes are present. Indistinctly marginated right hilar node about 0.8 cm in short axis on image 21/2. Lungs/Pleura: Unremarkable Musculoskeletal: Unremarkable CT ABDOMEN PELVIS FINDINGS Hepatobiliary: Unremarkable Pancreas: Unremarkable Spleen: Unremarkable. No splenomegaly. Adrenals/Urinary Tract: Unremarkable Stomach/Bowel: Unremarkable Vascular/Lymphatic: Left external iliac node 1.1 cm in short axis on image 102/2, previously 1.2 cm by my measurements. Right external iliac node 1.2 cm in short axis on image 106/2, formerly by my measurements 1.5 cm. Reproductive: Uterus absent. Adnexa unremarkable. Other: No supplemental non-categorized findings. Musculoskeletal: Short pedicles and mild spondylosis and degenerative disc disease lead to borderline lower lumbar impingement. IMPRESSION: 1. Bilateral axillary adenopathy and mild bilateral external iliac adenopathy. Left axillary lymph node has enlarged compared to prior exams of 10/27/2019 and 06/24/2019. A  right axillary lymph node  is stable to minimally reduced from 06/24/2019. The external iliac nodes are likewise slightly reduced from 06/24/2019. 2. Mild cardiomegaly. 3. Short pedicles and mild spondylosis and degenerative disc disease lead to borderline lower lumbar impingement. Electronically Signed   By: Van Clines M.D.   On: 12/22/2019 14:17   CT Abdomen Pelvis W Contrast  Result Date: 12/22/2019 CLINICAL DATA:  Restaging of nodular lymphocyte-predominant Hodgkin's lymphoma. Follow up of right axillary lymph node seen on prior CT. Prior chemotherapy. EXAM: CT CHEST, ABDOMEN, AND PELVIS WITH CONTRAST TECHNIQUE: Multidetector CT imaging of the chest, abdomen and pelvis was performed following the standard protocol during bolus administration of intravenous contrast. CONTRAST:  177mL OMNIPAQUE IOHEXOL 300 MG/ML  SOLN COMPARISON:  10/27/2019 and 06/24/2019 FINDINGS: CT CHEST FINDINGS Cardiovascular: Left Port-A-Cath tip: SVC. Mild cardiomegaly. Mediastinum/Nodes: Bilateral axillary adenopathy. Left axillary node 1.9 cm in short axis on image 9/2, formerly 1.0 cm 10/27/2019. Right axillary lymph node 2.0 cm in short axis on 15/2, previously not included on imaging due to field of view. However, back on 06/24/2019 this measured 2.2 cm in diameter. Multiple additional prominent axillary nodes are present. Indistinctly marginated right hilar node about 0.8 cm in short axis on image 21/2. Lungs/Pleura: Unremarkable Musculoskeletal: Unremarkable CT ABDOMEN PELVIS FINDINGS Hepatobiliary: Unremarkable Pancreas: Unremarkable Spleen: Unremarkable. No splenomegaly. Adrenals/Urinary Tract: Unremarkable Stomach/Bowel: Unremarkable Vascular/Lymphatic: Left external iliac node 1.1 cm in short axis on image 102/2, previously 1.2 cm by my measurements. Right external iliac node 1.2 cm in short axis on image 106/2, formerly by my measurements 1.5 cm. Reproductive: Uterus absent. Adnexa unremarkable. Other: No supplemental  non-categorized findings. Musculoskeletal: Short pedicles and mild spondylosis and degenerative disc disease lead to borderline lower lumbar impingement. IMPRESSION: 1. Bilateral axillary adenopathy and mild bilateral external iliac adenopathy. Left axillary lymph node has enlarged compared to prior exams of 10/27/2019 and 06/24/2019. A right axillary lymph node is stable to minimally reduced from 06/24/2019. The external iliac nodes are likewise slightly reduced from 06/24/2019. 2. Mild cardiomegaly. 3. Short pedicles and mild spondylosis and degenerative disc disease lead to borderline lower lumbar impingement. Electronically Signed   By: Van Clines M.D.   On: 12/22/2019 14:17     ASSESSMENT:  1.  Nodular lymphocyte predominant Hodgkin's lymphoma: -Presentation with right inguinal lymphadenopathy, and night sweats.  Excisional biopsy on 04/07/2016 -PET scan on 04/25/2016 showed hypermetabolic enlarged bilateral axillary, bilateral external iliac and bilateral inguinal lymph nodes. -6 cycles of R-CHOP from 05/05/2016 through 08/19/2016. -She had some residual adenopathy at the end of chemotherapy which have appeared to have slightly increased on subsequent scans. -She also has hidradenitis in her axilla which could also contribute to the waxing and waning adenopathy. -PET scan on 03/08/2018 showed hypermetabolic right axillary lymph node measuring 1.8 cm, previously 1.2 cm with SUV 11, previously 6.2.  No abdominal adenopathy or organ involvement.  Hypermetabolic left groin and external iliac lymph nodes are more or less stable. -CT CAP on 12/22/2019 showed left axillary lymph node measuring 1.9 cm, previously 1.0 cm.  Right axillary lymph node is more or less stable.  Left and right external iliac lymph nodes are stable.  Mild cardiomegaly.   PLAN:  1.  Nodular lymphocyte predominant Hodgkin's lymphoma: -She does not report any night sweats at this time. -I have reviewed scans with her.  There is  slight enlargement of left axillary lymph node compared to prior scan.  This could be reactive from her recent COVID-19 vaccine.  However we will keep a  close eye on it. -I have reviewed her labs.  Normal LDH, CMP.  I have recommended follow-up in 6 months with repeat scan and labs.  2.  CHF: -Admitted from 10/27/2019 through 10/30/2019 -2D echo on 10/28/2019 shows EF 45%.  LV mildly decreased function.  LV demonstrates global hypokinesis. -Continue Coreg 6.25 mg twice daily.  Continue Lasix 40 mg by mouth daily.  Continue lisinopril 5 mg daily.   Orders placed this encounter:  No orders of the defined types were placed in this encounter.    Derek Jack, MD [facility] [phone]   I, Jacqualyn Posey, am acting as a scribe for Dr. Sanda Linger.  I, Derek Jack MD, have reviewed the above documentation for accuracy and completeness, and I agree with the above.

## 2020-01-18 NOTE — Patient Instructions (Signed)
[place] at [facility] Discharge Instructions  You were seen today by Dr. Delton Coombes. He went over your recent results. He is scheduling you for a repeat CT chest abdomen and pelvis prior to your next visit. He will see you back in 6 months for labs and follow up.   Thank you for choosing [place] at [facility] to provide your oncology and hematology care.  To afford each patient quality time with our provider, please arrive at least 15 minutes before your scheduled appointment time.   If you have a lab appointment with the [facility] please come in thru the  Main Entrance and check in at the main information desk  You need to re-schedule your appointment should you arrive 10 or more minutes late.  We strive to give you quality time with our providers, and arriving late affects you and other patients whose appointments are after yours.  Also, if you no show three or more times for appointments you may be dismissed from the clinic at the providers discretion.     Again, thank you for choosing [facility].  Our hope is that these requests will decrease the amount of time that you wait before being seen by our physicians.       _____________________________________________________________  Should you have questions after your visit to [facility], please contact our office at [phone] between the hours of 8:00 a.m. and 4:30 p.m.  Voicemails left after 4:00 p.m. will not be returned until the following business day.  For prescription refill requests, have your pharmacy contact our office and allow 72 hours.    Cancer Center Support Programs:   > Cancer Support Group  2nd Tuesday of the month 1pm-2pm, Journey Room

## 2020-02-15 ENCOUNTER — Telehealth: Payer: Self-pay | Admitting: Student

## 2020-02-15 NOTE — Telephone Encounter (Signed)
  Patient Consent for Virtual Visit         [name] has provided verbal consent on 02/15/2020 for a virtual visit (video or telephone).   CONSENT FOR VIRTUAL VISIT FOR:  [name]  By participating in this virtual visit I agree to the following:  I hereby voluntarily request, consent and authorize [facility] and its employed or contracted physicians, physician assistants, nurse practitioners or other licensed health care professionals (the Practitioner), to provide me with telemedicine health care services (the "Services") as deemed necessary by the treating Practitioner. I acknowledge and consent to receive the Services by the Practitioner via telemedicine. I understand that the telemedicine visit will involve communicating with the Practitioner through live audiovisual communication technology and the disclosure of certain medical information by electronic transmission. I acknowledge that I have been given the opportunity to request an in-person assessment or other available alternative prior to the telemedicine visit and am voluntarily participating in the telemedicine visit.  I understand that I have the right to withhold or withdraw my consent to the use of telemedicine in the course of my care at any time, without affecting my right to future care or treatment, and that the Practitioner or I may terminate the telemedicine visit at any time. I understand that I have the right to inspect all information obtained and/or recorded in the course of the telemedicine visit and may receive copies of available information for a reasonable fee.  I understand that some of the potential risks of receiving the Services via telemedicine include:  Marland Kitchen Delay or interruption in medical evaluation due to technological equipment failure or disruption; . Information transmitted may not be sufficient (e.g. poor resolution of images) to allow for appropriate medical decision making by the  Practitioner; and/or  . In rare instances, security protocols could fail, causing a breach of personal health information.  Furthermore, I acknowledge that it is my responsibility to provide information about my medical history, conditions and care that is complete and accurate to the best of my ability. I acknowledge that Practitioner's advice, recommendations, and/or decision may be based on factors not within their control, such as incomplete or inaccurate data provided by me or distortions of diagnostic images or specimens that may result from electronic transmissions. I understand that the practice of medicine is not an exact science and that Practitioner makes no warranties or guarantees regarding treatment outcomes. I acknowledge that a copy of this consent can be made available to me via my patient portal ([facility]), or I can request a printed copy by calling the office of [facility].    I understand that my insurance will be billed for this visit.   I have read or had this consent read to me. . I understand the contents of this consent, which adequately explains the benefits and risks of the Services being provided via telemedicine.  . I have been provided ample opportunity to ask questions regarding this consent and the Services and have had my questions answered to my satisfaction. . I give my informed consent for the services to be provided through the use of telemedicine in my medical care

## 2020-02-16 ENCOUNTER — Telehealth: Payer: 59 | Admitting: Cardiovascular Disease

## 2020-03-01 ENCOUNTER — Telehealth (INDEPENDENT_AMBULATORY_CARE_PROVIDER_SITE_OTHER): Payer: 59 | Admitting: Student

## 2020-03-01 ENCOUNTER — Encounter: Payer: Self-pay | Admitting: Student

## 2020-03-01 ENCOUNTER — Other Ambulatory Visit: Payer: Self-pay

## 2020-03-01 VITALS — BP 122/97 | HR 99 | Ht 64.0 in | Wt 278.0 lb

## 2020-03-01 DIAGNOSIS — I5022 Chronic systolic (congestive) heart failure: Secondary | ICD-10-CM

## 2020-03-01 DIAGNOSIS — G4733 Obstructive sleep apnea (adult) (pediatric): Secondary | ICD-10-CM | POA: Diagnosis not present

## 2020-03-01 DIAGNOSIS — M79642 Pain in left hand: Secondary | ICD-10-CM

## 2020-03-01 DIAGNOSIS — M79641 Pain in right hand: Secondary | ICD-10-CM

## 2020-03-01 DIAGNOSIS — I10 Essential (primary) hypertension: Secondary | ICD-10-CM

## 2020-03-01 DIAGNOSIS — I428 Other cardiomyopathies: Secondary | ICD-10-CM | POA: Diagnosis not present

## 2020-03-01 DIAGNOSIS — Z9989 Dependence on other enabling machines and devices: Secondary | ICD-10-CM

## 2020-03-01 NOTE — Patient Instructions (Signed)
Medication Instructions:  Your physician recommends that you continue on your current medications as directed. Please refer to the Current Medication list given to you today.  *If you need a refill on your cardiac medications before your next appointment, please call your pharmacy*   Lab Work: None today If you have labs (blood work) drawn today and your tests are completely normal, you will receive your results only by:  [facility] (if you have MyChart) OR  A paper copy in the mail If you have any lab test that is abnormal or we need to change your treatment, we will call you to review the results.   Testing/Procedures: Your physician has requested that you have an echocardiogram. Echocardiography is a painless test that uses sound waves to create images of your heart. It provides your doctor with information about the size and shape of your heart and how well your hearts chambers and valves are working. This procedure takes approximately one hour. There are no restrictions for this procedure.     Follow-Up: At [facility], you and your health needs are our priority.  As part of our continuing mission to provide you with exceptional heart care, we have created designated Provider Care Teams.  These Care Teams include your primary Cardiologist (physician) and Advanced Practice Providers (APPs -  Physician Assistants and Nurse Practitioners) who all work together to provide you with the care you need, when you need it.  We recommend signing up for the patient portal called "MyChart".  Sign up information is provided on this After Visit Summary.  MyChart is used to connect with patients for Virtual Visits (Telemedicine).  Patients are able to view lab/test results, encounter notes, upcoming appointments, etc.  Non-urgent messages can be sent to your provider as well.   To learn more about what you can do with MyChart, go to [email or web address].    Your next appointment:   6  month(s)  The format for your next appointment:   In Person In the [place] office  Provider:   Carlyle Dolly, MD   Other Instructions A referral was placed to [facility] in [place].They will call you to schedule an appt.

## 2020-03-01 NOTE — Progress Notes (Signed)
Virtual Visit via Telephone Note   This visit type was conducted due to national recommendations for restrictions regarding the COVID-19 Pandemic (e.g. social distancing) in an effort to limit this patient's exposure and mitigate transmission in our community.  Due to her co-morbid illnesses, this patient is at least at moderate risk for complications without adequate follow up.  This format is felt to be most appropriate for this patient at this time.  The patient did not have access to video technology/had technical difficulties with video requiring transitioning to audio format only (telephone).  All issues noted in this document were discussed and addressed.  No physical exam could be performed with this format.  Please refer to the patient's chart for her  consent to telehealth for [facility].   The patient was identified using 2 identifiers.  Date:  03/01/2020   ID:  [name], DOB 29-Jan-1986, MRN [id number]  Patient Location: Home Provider Location: Office/Clinic  PCP:  Patient, No Pcp Per  Cardiologist:  Kate Sable, MD Electrophysiologist:  None   Evaluation Performed:  Follow-Up Visit  Chief Complaint: 86-month visit  History of Present Illness:    [name] is a 34 y.o. female with past medical history chronic systolic CHF/presumed NICM (EF 45-50% by echo in 09/2018, at 45% by echo in 10/2019 - possibly secondary to doxorubicin), HTN, OSA and Hodgkin's Lymphoma who presents for a 57-month follow-up telehealth visit.  She was last examined by Dr. Bronson Ing in 10/2019 following a recent hospitalization for an acute CHF exacerbation. She had been discharged on Lasix 40 mg daily while remaining on Coreg 6.25 mg twice daily and Lisinopril 5 mg daily. It was felt that her cardiomyopathy might be secondary to doxorubicin as it is a component of her R-CHOP regimen for lymphoma in 2017 - 2018. Blood pressure was well-controlled and she was continued on her  current medication regimen.   In talking with the patient today, she reports overall doing well from a cardiac perspective since her last visit and reports her breathing has been at baseline. She reports good compliance with Lasix and does take an extra tablet if needed for lower extremity edema. She denies any specific orthopnea or PND.  She does utilize her CPAP on a nightly basis. No recent chest pain or palpitations.  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 She uses her hands all throughout the day due to typing on the keyboard but also and making her own nail polish solutions. Over the past several months, she has noticed paresthesias along her hands bilaterally with repetitive motions. Denies any radiating symptoms further up her arm. No known injuries. Reports having neuropathy with her treatment for lymphoma in 2017 but denies any recurrent symptoms until now.   The patient does not have symptoms concerning for COVID-19 infection (fever, chills, cough, or new shortness of breath).    Past Medical History:  Diagnosis Date   Acute on chronic systolic CHF (congestive heart failure) ([place]) 10/28/2019   a. EF 45-50% by echo in 09/2018 b. EF at 45% by echo in 10/2019   Anxiety    Asthma    had since childhood; has not had to use inhaler or nebulizer for 8-9 years. Been doing well.   Cervical lymphadenopathy    left groin lymph node, axillary lymph nodes:  seen on PET scan 02/2018  Dyspnea    Headache(784.0)    usually controlled c Tylenol when not pregnant, but more painful headaches  during preg. due to hypertension.   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 2011   Iron deficiency 01/13/2017   Lymphoma, Hodgkin's ([place])    Stage IV   Sleep apnea    Past  Surgical History:  Procedure Laterality Date   ABDOMINAL HYSTERECTOMY  05/2017   ADENOIDECTOMY     age 72   BREAST RECONSTRUCTION  December 2001   breast reduction at age 56   FULGURATION OF BLADDER TUMOR N/A 07/23/2016   Procedure: CYSTOSCOPY, BIOPSY AND FULGURATION OF BLADDER;  Surgeon: Cleon Gustin, MD;  Location: [facility];  Service: Urology;  Laterality: N/A;   MASS EXCISION Right 04/07/2016   Procedure: EXCISION/BIOPSY SOFT TISSUE MASS RIGHT THIGH;  Surgeon: Aviva Signs, MD;  Location: [facility];  Service: General;  Laterality: Right;   port a cath placed     PORTACATH PLACEMENT Left 04/28/2016   Procedure: INSERTION PORT-A-CATH;  Surgeon: Aviva Signs, MD;  Location: [facility];  Service: General;  Laterality: Left;     Current Meds  Medication Sig   albuterol (VENTOLIN HFA) 108 (90 Base) MCG/ACT inhaler Inhale 1-2 puffs into the lungs every 6 (six) hours as needed for wheezing or shortness of breath.    carvedilol (COREG) 6.25 MG tablet Take 1 tablet (6.25 mg total) by mouth 2 (two) times daily with a meal.   COVID-19 mRNA vaccine, Moderna, 100 MCG/0.5ML SUSP Inject 0.5 mLs into the muscle once.   furosemide (LASIX) 40 MG tablet Take 1 tablet (40 mg total) by mouth daily.   lisinopril (ZESTRIL) 5 MG tablet TAKE 1 TABLET ONCE DAILY.   potassium chloride SA (KLOR-CON) 20 MEQ tablet Take 1 tablet (20 mEq total) by mouth daily.   Vitamin D, Ergocalciferol, (DRISDOL) 50000 units CAPS capsule Take 50,000 Units by mouth every Monday. Takes on Mondays.     Allergies:   Patient has no known allergies.   Social History   Tobacco Use   Smoking status: Never Smoker   Smokeless tobacco: Never Used  Vaping Use   Vaping Use: Never used  Substance Use Topics   Alcohol use: Yes    Alcohol/week: 1.0 standard drink    Types: 1 Glasses of wine per week   Drug use: No     Family Hx: The patient's family history includes Cancer in her mother; Diabetes in her paternal  grandmother; Heart disease in her maternal grandmother; Hypertension in her father and mother.  ROS:   Please see the history of present illness.     All other systems reviewed and are negative.   Prior CV studies:   The following studies were reviewed today:  Echocardiogram: 10/2019 IMPRESSIONS    1. Left ventricular ejection fraction, by estimation, is approximately  45%. The left ventricle has mildly decreased function. The left ventricle  demonstrates global hypokinesis. Left ventricular diastolic parameters are  indeterminate.  2. Right ventricular systolic function is normal. The right ventricular  size is normal. There is mildly elevated pulmonary artery systolic  pressure. The estimated right ventricular systolic pressure is 93.2 mmHg.  3. Left atrial size was upper normal.  4. The mitral valve is grossly normal. Mild mitral valve regurgitation.  5. The aortic valve is tricuspid. Aortic valve regurgitation is not  visualized.  6. The inferior  vena cava is normal in size with greater than 50%  respiratory variability, suggesting right atrial pressure of 3 mmHg.    Labs/Other Tests and Data Reviewed:    EKG:  No ECG reviewed.  Recent Labs: 10/27/2019: B Natriuretic Peptide 256.0 11/09/2019: Magnesium 2.1 12/22/2019: ALT 21; BUN 16; Creatinine, Ser 0.80; Hemoglobin 13.2; Platelets 371; Potassium 4.1; Sodium 137   Recent Lipid Panel Lab Results  Component Value Date/Time   CHOL 142 02/16/2013 05:21 PM   TRIG 64 10/27/2019 04:31 PM   TRIG 72 02/16/2013 05:21 PM   HDL 36 (L) 02/16/2013 05:21 PM   LDLCALC 92 02/16/2013 05:21 PM    Wt Readings from Last 3 Encounters:  03/01/20 278 lb (126.1 kg)  01/18/20 282 lb (127.9 kg)  11/09/19 279 lb (126.6 kg)     Objective:    Vital Signs:  BP (!) 122/97    Pulse 99    Ht 5\' 4"  (1.626 m)    Wt 278 lb (126.1 kg)    LMP 02/02/2013    BMI 47.72 kg/m    General: Pleasant female sounding in NAD Psych: Normal  affect. Neuro: Alert and oriented X 3.  Lungs:  Resp regular and unlabored while talking on the phone.    ASSESSMENT & PLAN:    1. Chronic Systolic CHF/Presumed NICM - She has a known reduced EF of 45% by most recent echocardiogram in 10/2019 and her cardiomyopathy is felt to possibly be secondary to Doxorubicin as she received this during her treatment for Hodgkin's Lymphoma. - Volume status has been at baseline and she denies any recent orthopnea, PND or edema. She is currently on Coreg 6.25mg  BID, Lisinopril 5mg  daily and Lasix 40mg  daily. Given her volume status has improved, will obtain a repeat echocardiogram to reassess LV function. If EF remains reduced, would plan for further titration of Coreg or the addition of Spironolactone.   2. 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 I encouraged her to continue to follow BP at home and report back if readings remain elevated. Continue current medication regimen for now.   3. OSA - Continued compliance with CPAP encouraged. PASP was mildly elevated by her echo in 10/2019.  4. Bilateral Hand Pain - She does report paraesthesias along her hands bilaterally which is worse with repetitive motions. No radiating pain down her arm. Symptoms seem most consistent with carpal tunnel by her description. She does not have a PCP. Will enter a referral to Orthopedics today.    COVID-19 Education: The signs and symptoms of COVID-19 were discussed with the patient and how to seek care for testing (follow up with PCP or arrange E-visit).  The importance of social distancing was discussed today.  Time:   Today, I have spent 19 minutes with the patient with telehealth technology discussing the above problems.     Medication Adjustments/Labs and Tests Ordered: Current medicines are reviewed at length with the patient today.  Concerns regarding medicines are outlined  above.   Tests Ordered: Orders Placed This Encounter  Procedures   AMB referral to orthopedics   ECHOCARDIOGRAM COMPLETE    Medication Changes: No orders of the defined types were placed in this encounter.   Follow Up: With Dr. Harl Bowie in 6 months.   Signed, Erma Heritage, PA-C  03/01/2020 6:58 PM    [facility]

## 2020-03-09 ENCOUNTER — Other Ambulatory Visit (HOSPITAL_COMMUNITY): Payer: Self-pay | Admitting: Nurse Practitioner

## 2020-03-09 DIAGNOSIS — E876 Hypokalemia: Secondary | ICD-10-CM

## 2020-03-14 ENCOUNTER — Other Ambulatory Visit: Payer: Self-pay | Admitting: *Deleted

## 2020-03-14 MED ORDER — CARVEDILOL 6.25 MG PO TABS: 6.2500 mg | ORAL_TABLET | Freq: Two times a day (BID) | ORAL | 6 refills | Status: AC

## 2020-03-22 ENCOUNTER — Ambulatory Visit (INDEPENDENT_AMBULATORY_CARE_PROVIDER_SITE_OTHER): Payer: 59

## 2020-03-22 DIAGNOSIS — I428 Other cardiomyopathies: Secondary | ICD-10-CM

## 2020-03-22 LAB — ECHOCARDIOGRAM COMPLETE
Area-P 1/2: 4.86 cm2
Calc EF: 52.2 %
MV M vel: 4.24 m/s
MV Peak grad: 71.8 mmHg
S' Lateral: 3.69 cm
Single Plane A2C EF: 49.9 %
Single Plane A4C EF: 50.4 %

## 2020-04-05 ENCOUNTER — Other Ambulatory Visit: Payer: Self-pay | Admitting: Student

## 2020-04-05 DIAGNOSIS — M79641 Pain in right hand: Secondary | ICD-10-CM

## 2020-06-20 ENCOUNTER — Other Ambulatory Visit: Payer: Self-pay | Admitting: *Deleted

## 2020-06-20 MED ORDER — FUROSEMIDE 40 MG PO TABS: 40.0000 mg | ORAL_TABLET | Freq: Every day | ORAL | 1 refills | Status: AC

## 2020-07-20 ENCOUNTER — Ambulatory Visit (HOSPITAL_COMMUNITY): Payer: 59

## 2020-07-20 ENCOUNTER — Inpatient Hospital Stay (HOSPITAL_COMMUNITY): Payer: 59

## 2020-07-20 ENCOUNTER — Inpatient Hospital Stay (HOSPITAL_COMMUNITY): Payer: 59 | Attending: Hematology

## 2020-07-20 ENCOUNTER — Other Ambulatory Visit: Payer: Self-pay

## 2020-07-20 DIAGNOSIS — C8108 Nodular lymphocyte predominant Hodgkin lymphoma, lymph nodes of multiple sites: Secondary | ICD-10-CM | POA: Insufficient documentation

## 2020-07-20 DIAGNOSIS — C8100 Nodular lymphocyte predominant Hodgkin lymphoma, unspecified site: Secondary | ICD-10-CM

## 2020-07-20 DIAGNOSIS — L732 Hidradenitis suppurativa: Secondary | ICD-10-CM | POA: Insufficient documentation

## 2020-07-20 DIAGNOSIS — F419 Anxiety disorder, unspecified: Secondary | ICD-10-CM | POA: Diagnosis not present

## 2020-07-20 DIAGNOSIS — R911 Solitary pulmonary nodule: Secondary | ICD-10-CM | POA: Insufficient documentation

## 2020-07-20 DIAGNOSIS — I110 Hypertensive heart disease with heart failure: Secondary | ICD-10-CM | POA: Diagnosis not present

## 2020-07-20 DIAGNOSIS — I509 Heart failure, unspecified: Secondary | ICD-10-CM | POA: Insufficient documentation

## 2020-07-20 DIAGNOSIS — J45909 Unspecified asthma, uncomplicated: Secondary | ICD-10-CM | POA: Diagnosis not present

## 2020-07-20 DIAGNOSIS — Z7901 Long term (current) use of anticoagulants: Secondary | ICD-10-CM | POA: Insufficient documentation

## 2020-07-20 DIAGNOSIS — G4730 Sleep apnea, unspecified: Secondary | ICD-10-CM | POA: Diagnosis not present

## 2020-07-20 DIAGNOSIS — H538 Other visual disturbances: Secondary | ICD-10-CM | POA: Diagnosis not present

## 2020-07-20 LAB — COMPREHENSIVE METABOLIC PANEL
ALT: 15 U/L (ref 0–44)
AST: 13 U/L — ABNORMAL LOW (ref 15–41)
Albumin: 3.7 g/dL (ref 3.5–5.0)
Alkaline Phosphatase: 32 U/L — ABNORMAL LOW (ref 38–126)
Anion gap: 7 (ref 5–15)
BUN: 11 mg/dL (ref 6–20)
CO2: 25 mmol/L (ref 22–32)
Calcium: 9.1 mg/dL (ref 8.9–10.3)
Chloride: 106 mmol/L (ref 98–111)
Creatinine, Ser: 0.69 mg/dL (ref 0.44–1.00)
GFR, Estimated: >60 mL/min (ref >60)
Glucose, Bld: 90 mg/dL (ref 70–99)
Potassium: 3.8 mmol/L (ref 3.5–5.1)
Sodium: 138 mmol/L (ref 135–145)
Total Bilirubin: 0.6 mg/dL (ref 0.3–1.2)
Total Protein: 7.1 g/dL (ref 6.5–8.1)

## 2020-07-20 LAB — CBC WITH DIFFERENTIAL/PLATELET
Abs Immature Granulocytes: 0.02 10*3/uL (ref 0.00–0.07)
Basophils Absolute: 0 10*3/uL (ref 0.0–0.1)
Basophils Relative: 1 %
Eosinophils Absolute: 0.2 10*3/uL (ref 0.0–0.5)
Eosinophils Relative: 3 %
HCT: 40.5 % (ref 36.0–46.0)
Hemoglobin: 12.5 g/dL (ref 12.0–15.0)
Immature Granulocytes: 0 %
Lymphocytes Relative: 29 %
Lymphs Abs: 1.9 10*3/uL (ref 0.7–4.0)
MCH: 26.5 pg (ref 26.0–34.0)
MCHC: 30.9 g/dL (ref 30.0–36.0)
MCV: 86 fL (ref 80.0–100.0)
Monocytes Absolute: 0.5 10*3/uL (ref 0.1–1.0)
Monocytes Relative: 8 %
Neutro Abs: 4 10*3/uL (ref 1.7–7.7)
Neutrophils Relative %: 59 %
Platelets: 357 10*3/uL (ref 150–400)
RBC: 4.71 MIL/uL (ref 3.87–5.11)
RDW: 13.9 % (ref 11.5–15.5)
WBC: 6.6 10*3/uL (ref 4.0–10.5)
nRBC: 0 % (ref 0.0–0.2)

## 2020-07-20 LAB — LACTATE DEHYDROGENASE: LDH: 99 U/L (ref 98–192)

## 2020-07-20 LAB — MAGNESIUM: Magnesium: 1.9 mg/dL (ref 1.7–2.4)

## 2020-07-23 ENCOUNTER — Ambulatory Visit (HOSPITAL_COMMUNITY)
Admission: RE | Admit: 2020-07-23 | Discharge: 2020-07-23 | Disposition: A | Discharge status: Home / Self Care | Payer: 59 | Source: Ambulatory Visit | Attending: Hematology | Admitting: Hematology

## 2020-07-23 ENCOUNTER — Other Ambulatory Visit: Payer: Self-pay

## 2020-07-23 DIAGNOSIS — C8100 Nodular lymphocyte predominant Hodgkin lymphoma, unspecified site: Secondary | ICD-10-CM | POA: Insufficient documentation

## 2020-07-23 MED ORDER — IOHEXOL 300 MG/ML  SOLN
100.0000 mL | Freq: Once | INTRAMUSCULAR | Status: AC | PRN
  Administered 2020-07-23: 100 mL via INTRAVENOUS

## 2020-07-23 NOTE — Progress Notes (Signed)
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  CURRENT THERAPY: Observation  INTERVAL HISTORY:  Ms. ZYNASIA BURKLOW, a 34 y.o. female, returns for routine follow-up for her nodular lymphocyte predominant Hodgkin's lymphoma. Aribella was last seen on 01/18/20.  She was diagnosed with CHF and was admitted to the hospital from 10/27/2019 to 10/30/2019. She notes that they diuresed about 6 lbs of fluid off of her.  Heart failure appears stable.  She is now on lasix and follows up with cardiology.  She denies any lower extremity swelling.  She reports taking two Lasix if she has weight gain or significant swelling in her lower extremities.   She denies any B symptoms including night sweats, changes in her appetite or unexplained weight loss.  She is now fully vaccinated against COVID-19. She got her first COVID shot in her right arm and her second shot in her left arm, which could explain her recent enlargement of her axillary lymph nodes.    She reports some shortness of breath with exertion but otherwise she is feeling well.  REVIEW OF SYSTEMS:  Review of Systems  Constitutional: Positive for fatigue (moderate). Negative for appetite change.  Respiratory: Positive for shortness of breath (With exertion).   Neurological: Positive for numbness.  Psychiatric/Behavioral: The patient is nervous/anxious.   All other systems reviewed and are negative.   PAST MEDICAL/SURGICAL HISTORY:  Past Medical History:  Diagnosis Date  . Acute on chronic systolic CHF (congestive heart failure) ([place]) 10/28/2019   a. EF 45-50% by echo in 09/2018 b. EF at 45% by echo in 10/2019  . Anxiety   . Asthma    had since childhood; has not had to use inhaler or  nebulizer for 8-9 years. Been doing well.  . Cervical lymphadenopathy    left groin lymph node, axillary lymph nodes:  seen on PET scan 02/2018  . Dyspnea   . Headache(784.0)    usually controlled c Tylenol when not pregnant, but more painful headaches  during preg. due to hypertension.  . Hidradenitis suppurativa   . Hodgkin lymphoma, nodular lymphocyte predominance ([place]) 04/15/2016  . Hypertension December 2011   Been on Aldomet since Dec. 2011  . Iron deficiency 01/13/2017  . Lymphoma, Hodgkin's ([place])    Stage IV  . Sleep apnea    Past Surgical History:  Procedure Laterality Date  . ABDOMINAL HYSTERECTOMY  05/2017  . ADENOIDECTOMY     age 39  . BREAST RECONSTRUCTION  December 2001   breast reduction at age 89  . FULGURATION OF BLADDER TUMOR N/A 07/23/2016   Procedure: CYSTOSCOPY, BIOPSY AND FULGURATION OF BLADDER;  Surgeon: Cleon Gustin, MD;  Location: [facility];  Service: Urology;  Laterality: N/A;  . MASS EXCISION Right 04/07/2016   Procedure: EXCISION/BIOPSY SOFT TISSUE MASS RIGHT THIGH;  Surgeon: Aviva Signs, MD;  Location: [facility];  Service: General;  Laterality: Right;  . port a cath placed    . PORTACATH PLACEMENT Left 04/28/2016   Procedure: INSERTION PORT-A-CATH;  Surgeon: Aviva Signs, MD;  Location: [facility];  Service: General;  Laterality: Left;    SOCIAL HISTORY:  Social History   Socioeconomic History  . Marital status: Married    Spouse name: Not on file  . Number of  children: Not on file  . Years of education: Not on file  . Highest education level: Not on file  Occupational History  . Not on file  Tobacco Use  . Smoking status: Never Smoker  . Smokeless tobacco: Never Used  Vaping Use  . Vaping Use: Never used  Substance and Sexual Activity  . Alcohol use: Yes    Alcohol/week: 1.0 standard drink    Types: 1 Glasses of wine per week  . Drug use: No  . Sexual activity: Yes    Birth control/protection: I.U.D.  Other Topics Concern  . Not on file    Social History Narrative  . Not on file   Social Determinants of Health   Financial Resource Strain: Medium Risk  . Difficulty of Paying Living Expenses: Somewhat hard  Food Insecurity: No Food Insecurity  . Worried About Charity fundraiser in the Last Year: Never true  . Ran Out of Food in the Last Year: Never true  Transportation Needs: No Transportation Needs  . Lack of Transportation (Medical): No  . Lack of Transportation (Non-Medical): No  Physical Activity: Sufficiently Active  . Days of Exercise per Week: 7 days  . Minutes of Exercise per Session: 150+ min  Stress: Stress Concern Present  . Feeling of Stress : Very much  Social Connections: Socially Integrated  . Frequency of Communication with Friends and Family: More than three times a week  . Frequency of Social Gatherings with Friends and Family: Once a week  . Attends Religious Services: More than 4 times per year  . Active Member of Clubs or Organizations: Yes  . Attends Archivist Meetings: More than 4 times per year  . Marital Status: Married  Human resources officer Violence: Not At Risk  . Fear of Current or Ex-Partner: No  . Emotionally Abused: No  . Physically Abused: No  . Sexually Abused: No    FAMILY HISTORY:  Family History  Problem Relation Age of Onset  . Hypertension Mother   . Cancer Mother   . Hypertension Father   . Heart disease Maternal Grandmother   . Diabetes Paternal Grandmother     CURRENT MEDICATIONS:  Current Outpatient Medications  Medication Sig Dispense Refill  . albuterol (VENTOLIN HFA) 108 (90 Base) MCG/ACT inhaler Inhale 1-2 puffs into the lungs every 6 (six) hours as needed for wheezing or shortness of breath.     . carvedilol (COREG) 6.25 MG tablet Take 1 tablet (6.25 mg total) by mouth 2 (two) times daily with a meal. 60 tablet 6  . COVID-19 mRNA vaccine, Moderna, 100 MCG/0.5ML SUSP Inject 0.5 mLs into the muscle once.    . doxycycline (VIBRA-TABS) 100 MG tablet Take  100 mg by mouth 2 (two) times daily.    . furosemide (LASIX) 40 MG tablet Take 1 tablet (40 mg total) by mouth daily. 90 tablet 1  . lisinopril (ZESTRIL) 5 MG tablet TAKE 1 TABLET ONCE DAILY. 90 tablet 1  . potassium chloride SA (KLOR-CON) 20 MEQ tablet TAKE 1 TABLET BY MOUTH ONCE DAILY. 30 tablet 0  . Vitamin D, Ergocalciferol, (DRISDOL) 50000 units CAPS capsule Take 50,000 Units by mouth every Monday. Takes on Mondays.     No current facility-administered medications for this visit.    ALLERGIES:  No Known Allergies  PHYSICAL EXAM:  Performance status (ECOG): 0 - Asymptomatic  Vitals:   07/24/20 0830  BP: 126/80  Pulse: 85  Resp: 20  Temp: (!) 97.5 F (36.4 C)  SpO2: 99%   Wt Readings from Last 3 Encounters:  07/24/20 281 lb 14.4 oz (127.9 kg)  03/01/20 278 lb (126.1 kg)  01/18/20 282 lb (127.9 kg)   Physical Exam Vitals reviewed.  Constitutional:      Appearance: Normal appearance.  Cardiovascular:     Rate and Rhythm: Normal rate and regular rhythm.  Pulmonary:     Effort: Pulmonary effort is normal.     Breath sounds: Normal breath sounds.  Abdominal:     Palpations: Abdomen is soft. There is no mass.     Tenderness: There is no abdominal tenderness.  Musculoskeletal:     Right lower leg: No edema.     Left lower leg: No edema.  Lymphadenopathy:     Upper Body:     Right upper body: Axillary adenopathy present.  Neurological:     General: No focal deficit present.     Mental Status: She is alert and oriented to person, place, and time.  Psychiatric:        Mood and Affect: Mood normal.        Behavior: Behavior normal.     LABORATORY DATA:  I have reviewed the labs as listed.  CBC Latest Ref Rng & Units 07/20/2020 12/22/2019 10/28/2019  WBC 4.0 - 10.5 K/uL 6.6 7.1 16.5(H)  Hemoglobin 12.0 - 15.0 g/dL 12.5 13.2 13.4  Hematocrit 36 - 46 % 40.5 43.7 43.9  Platelets 150 - 400 K/uL 357 371 433(H)   CMP Latest Ref Rng & Units 07/20/2020 12/22/2019 11/09/2019    Glucose 70 - 99 mg/dL 90 91 98  BUN 6 - 20 mg/dL 11 16 19   Creatinine 0.44 - 1.00 mg/dL 0.69 0.80 0.78  Sodium 135 - 145 mmol/L 138 137 137  Potassium 3.5 - 5.1 mmol/L 3.8 4.1 4.2  Chloride 98 - 111 mmol/L 106 103 104  CO2 22 - 32 mmol/L 25 27 25   Calcium 8.9 - 10.3 mg/dL 9.1 9.7 9.7  Total Protein 6.5 - 8.1 g/dL 7.1 7.5 -  Total Bilirubin 0.3 - 1.2 mg/dL 0.6 0.4 -  Alkaline Phos 38 - 126 U/L 32(L) 41 -  AST 15 - 41 U/L 13(L) 16 -  ALT 0 - 44 U/L 15 21 -      Component Value Date/Time   RBC 4.71 07/20/2020 1124   MCV 86.0 07/20/2020 1124   MCH 26.5 07/20/2020 1124   MCHC 30.9 07/20/2020 1124   RDW 13.9 07/20/2020 1124   LYMPHSABS 1.9 07/20/2020 1124   MONOABS 0.5 07/20/2020 1124   EOSABS 0.2 07/20/2020 1124   BASOSABS 0.0 07/20/2020 1124    DIAGNOSTIC IMAGING:  I have independently reviewed the scans and discussed with the patient. CT Chest W Contrast  Result Date: 07/23/2020 CLINICAL DATA:  Nodular lymphocyte-predominant Hodgkin's lymphoma, status post chemotherapy. EXAM: CT CHEST, ABDOMEN, AND PELVIS WITH CONTRAST TECHNIQUE: Multidetector CT imaging of the chest, abdomen and pelvis was performed following the standard protocol during bolus administration of intravenous contrast. CONTRAST:  143mL OMNIPAQUE IOHEXOL 300 MG/ML  SOLN COMPARISON:  12/22/2019 FINDINGS: CT CHEST FINDINGS Cardiovascular: Left Port-A-Cath tip: Upper SVC. Mild cardiomegaly, but increased from prior. Mediastinum/Nodes: Right axillary node 1.9 cm in short axis on image 13 of series 2, previously 1.6 cm. Left axillary lymph node 1.6 cm in short axis on image 9 of series 2, formerly 1.5 cm. Other bilateral axillary lymph nodes are present and there small mediastinal lymph nodes which are not pathologically enlarged. Lungs/Pleura: There is blurring of  a 6 by 3 mm left lower lobe pulmonary nodule on image 77 of series 3 due to motion. Overall I believe this finding to be stable compared to 12/22/2019.  Musculoskeletal: Unremarkable CT ABDOMEN PELVIS FINDINGS Hepatobiliary: Unremarkable Pancreas: Unremarkable Spleen: Unremarkable.  No splenomegaly. Adrenals/Urinary Tract: Unremarkable Stomach/Bowel: Unremarkable Vascular/Lymphatic: Right external iliac node 1.3 cm in short axis on image 110 of series 2, previously 1.2 cm. Left external iliac node 1.5 cm in short axis on image 103 of series 2, previously 1.3 cm. Left inguinal node 1.4 cm in short axis on image 125 of series 2, previously 1.1 cm. Stable upper normal sized right ileocolic lymph nodes. Reproductive: Uterus absent.  Adnexa unremarkable. Other: No supplemental non-categorized findings. Musculoskeletal: Mildly congenitally short pedicles contributing to foraminal narrowing bilaterally at L5-S1 and likely to a lesser extent at L3-4 and L4-5. IMPRESSION: 1. Mildly worsened bilateral axillary and pelvic adenopathy compared to the 12/22/2019 exam. 2. No splenomegaly or focal splenic lesion identified. 3. Lower lumbar foraminal narrowing primarily from congenitally short pedicles. 4. Mild cardiomegaly. Electronically Signed   By: Van Clines M.D.   On: 07/23/2020 16:46   CT Abdomen Pelvis W Contrast  Result Date: 07/23/2020 CLINICAL DATA:  Nodular lymphocyte-predominant Hodgkin's lymphoma, status post chemotherapy. EXAM: CT CHEST, ABDOMEN, AND PELVIS WITH CONTRAST TECHNIQUE: Multidetector CT imaging of the chest, abdomen and pelvis was performed following the standard protocol during bolus administration of intravenous contrast. CONTRAST:  123mL OMNIPAQUE IOHEXOL 300 MG/ML  SOLN COMPARISON:  12/22/2019 FINDINGS: CT CHEST FINDINGS Cardiovascular: Left Port-A-Cath tip: Upper SVC. Mild cardiomegaly, but increased from prior. Mediastinum/Nodes: Right axillary node 1.9 cm in short axis on image 13 of series 2, previously 1.6 cm. Left axillary lymph node 1.6 cm in short axis on image 9 of series 2, formerly 1.5 cm. Other bilateral axillary lymph nodes are  present and there small mediastinal lymph nodes which are not pathologically enlarged. Lungs/Pleura: There is blurring of a 6 by 3 mm left lower lobe pulmonary nodule on image 77 of series 3 due to motion. Overall I believe this finding to be stable compared to 12/22/2019. Musculoskeletal: Unremarkable CT ABDOMEN PELVIS FINDINGS Hepatobiliary: Unremarkable Pancreas: Unremarkable Spleen: Unremarkable.  No splenomegaly. Adrenals/Urinary Tract: Unremarkable Stomach/Bowel: Unremarkable Vascular/Lymphatic: Right external iliac node 1.3 cm in short axis on image 110 of series 2, previously 1.2 cm. Left external iliac node 1.5 cm in short axis on image 103 of series 2, previously 1.3 cm. Left inguinal node 1.4 cm in short axis on image 125 of series 2, previously 1.1 cm. Stable upper normal sized right ileocolic lymph nodes. Reproductive: Uterus absent.  Adnexa unremarkable. Other: No supplemental non-categorized findings. Musculoskeletal: Mildly congenitally short pedicles contributing to foraminal narrowing bilaterally at L5-S1 and likely to a lesser extent at L3-4 and L4-5. IMPRESSION: 1. Mildly worsened bilateral axillary and pelvic adenopathy compared to the 12/22/2019 exam. 2. No splenomegaly or focal splenic lesion identified. 3. Lower lumbar foraminal narrowing primarily from congenitally short pedicles. 4. Mild cardiomegaly. Electronically Signed   By: Van Clines M.D.   On: 07/23/2020 16:46     ASSESSMENT:  1.  Nodular lymphocyte predominant Hodgkin's lymphoma: -Presentation with right inguinal lymphadenopathy, and night sweats.  Excisional biopsy on 04/07/2016 -PET scan on 04/25/2016 showed hypermetabolic enlarged bilateral axillary, bilateral external iliac and bilateral inguinal lymph nodes. -6 cycles of R-CHOP from 05/05/2016 through 08/19/2016. -She had some residual adenopathy at the end of chemotherapy which have appeared to have slightly increased on subsequent scans. -She  also has hidradenitis  in her axilla which could also contribute to the waxing and waning adenopathy. -PET scan on 03/08/2018 showed hypermetabolic right axillary lymph node measuring 1.8 cm, previously 1.2 cm with SUV 11, previously 6.2.  No abdominal adenopathy or organ involvement.  Hypermetabolic left groin and external iliac lymph nodes are more or less stable. -CT CAP on 12/22/2019 showed left axillary lymph node measuring 1.9 cm, previously 1.0 cm.  Right axillary lymph node is more or less stable.  Left and right external iliac lymph nodes are stable.  Mild cardiomegaly.   PLAN:  1.  Nodular lymphocyte predominant Hodgkin's lymphoma: -She does not report any night sweats at this time. -I have reviewed scans with her.  There is slight enlargement of axillary lymph nodes but very mild.  Right axillary lymph node has increased from 1.6 cm to 1.9 cm.  Left axillary lymph node has increased from 1.5 cm to 1.6 cm.  Mediastinal lymph nodes are stable.  Right iliac node has increased from 1.2 cm to 1.3 cm and left iliac lymph node has increased from 1.3 to 1.5 cm.  Left inguinal lymph node has increased from 1.1cm to 1.4 cm.  Stable upper right ileocolic lymph nodes.  There is no splenomegaly.  -She is on doxycycline chronically secondary to hidradenitis.  -Lab work from today is stable.  Normal LDH and CMP. -Recommend follow-up in 6 months with repeat scan and labs.  2.  CHF: -Admitted from 10/27/2019 through 10/30/2019 -2D echo on 10/28/2019 shows EF 45%.  LV mildly decreased function.  LV demonstrates global hypokinesis. -Continue Coreg 6.25 mg twice daily.  Continue Lasix 40 mg by mouth daily.  Continue lisinopril 5 mg daily.  3.  Port removal: -Patient would like to have her port removed. -She has not had it flushed in several years. -Referral sent back to Dr. Arnoldo Morale office.  Disposition: -RTC in 6 months with repeat chest abdomen pelvis, labs and MD assessment.  Greater than 50% was spent in counseling and  coordination of care with this patient including but not limited to discussion of the relevant topics above (See A&P) including, but not limited to diagnosis and management of acute and chronic medical conditions.   Orders placed this encounter:  Orders Placed This Encounter  Procedures  . CT CHEST ABDOMEN PELVIS W CONTRAST   Faythe Casa, NP 07/24/2020 9:59 AM  [place] [phone]

## 2020-07-24 ENCOUNTER — Inpatient Hospital Stay (HOSPITAL_BASED_OUTPATIENT_CLINIC_OR_DEPARTMENT_OTHER): Payer: 59 | Admitting: Oncology

## 2020-07-24 VITALS — BP 126/80 | HR 85 | Temp 97.5°F | Resp 20 | Wt 281.9 lb

## 2020-07-24 DIAGNOSIS — C8100 Nodular lymphocyte predominant Hodgkin lymphoma, unspecified site: Secondary | ICD-10-CM

## 2020-07-24 DIAGNOSIS — C8108 Nodular lymphocyte predominant Hodgkin lymphoma, lymph nodes of multiple sites: Secondary | ICD-10-CM | POA: Diagnosis not present

## 2020-09-06 ENCOUNTER — Encounter: Payer: Self-pay | Admitting: Cardiology

## 2020-09-06 ENCOUNTER — Ambulatory Visit: Payer: 59 | Admitting: General Surgery

## 2020-09-06 ENCOUNTER — Other Ambulatory Visit: Payer: Self-pay

## 2020-09-06 ENCOUNTER — Encounter: Payer: Self-pay | Admitting: *Deleted

## 2020-09-06 ENCOUNTER — Ambulatory Visit (INDEPENDENT_AMBULATORY_CARE_PROVIDER_SITE_OTHER): Payer: 59 | Admitting: Cardiology

## 2020-09-06 VITALS — BP 100/78 | HR 82 | Ht 64.0 in | Wt 276.4 lb

## 2020-09-06 DIAGNOSIS — I5022 Chronic systolic (congestive) heart failure: Secondary | ICD-10-CM | POA: Diagnosis not present

## 2020-09-06 DIAGNOSIS — I10 Essential (primary) hypertension: Secondary | ICD-10-CM | POA: Diagnosis not present

## 2020-09-06 NOTE — Patient Instructions (Signed)

## 2020-09-06 NOTE — Progress Notes (Signed)
Clinical Summary [name] is a 35 y.o.female former patient of Dr Bronson Ing, this is our first visit together.  1. Chronic systolic HF - 12/930 echo LVEF 67-12%, normal diastolic function, normal RV hospitalized for acute on chronic systolic heart failure.She is euvolemic. Cardiomyopathy may be due to the doxorubicin component of the R-CHOP regimen which she received for Hodgkin's lymphoma between 05/05/2016 through 08/19/2016.  - no recent edema   2. HTN - compliant with meds - home bp's 100s/70s  3. OSA - compliant cpap - followed by Dr Brett Fairy.    Has had covid vaccine x 2.   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 Acute on chronic systolic CHF (congestive heart failure) ([place]) 10/28/2019   a. EF 45-50% by echo in 09/2018 b. EF at 45% by echo in 10/2019  . Anxiety   . Asthma    had since childhood; has not had to use inhaler or nebulizer for 8-9 years. Been doing well.  . Cervical lymphadenopathy    left groin lymph node, axillary lymph nodes:  seen on PET scan 02/2018  . Dyspnea   . Headache(784.0)    usually controlled c Tylenol when not pregnant, but more painful headaches  during preg. due to hypertension.  . Hidradenitis suppurativa   . Hodgkin lymphoma, nodular lymphocyte predominance ([place]) 04/15/2016  . Hypertension December 2011   Been on Aldomet since Dec. 2011  . Iron deficiency 01/13/2017  . Lymphoma, Hodgkin's ([place])    Stage IV  . Sleep apnea      No Known Allergies   Current Outpatient Medications  Medication Sig Dispense Refill  . albuterol (VENTOLIN HFA) 108 (90 Base) MCG/ACT inhaler Inhale 1-2 puffs into the lungs every 6 (six) hours as needed for wheezing or shortness of breath.     . carvedilol (COREG) 6.25 MG tablet Take 1 tablet (6.25 mg total) by mouth 2 (two) times daily with a meal. 60 tablet 6  . COVID-19 mRNA vaccine, Moderna, 100 MCG/0.5ML SUSP Inject  0.5 mLs into the muscle once.    . doxycycline (VIBRA-TABS) 100 MG tablet Take 100 mg by mouth 2 (two) times daily.    . furosemide (LASIX) 40 MG tablet Take 1 tablet (40 mg total) by mouth daily. 90 tablet 1  . lisinopril (ZESTRIL) 5 MG tablet TAKE 1 TABLET ONCE DAILY. 90 tablet 1  . potassium chloride SA (KLOR-CON) 20 MEQ tablet TAKE 1 TABLET BY MOUTH ONCE DAILY. 30 tablet 0  . Vitamin D, Ergocalciferol, (DRISDOL) 50000 units CAPS capsule Take 50,000 Units by mouth every Monday. Takes on Mondays.     No current facility-administered medications for this visit.     Past Surgical History:  Procedure Laterality Date  . ABDOMINAL HYSTERECTOMY  05/2017  . ADENOIDECTOMY     age 56  . BREAST RECONSTRUCTION  December 2001   breast reduction at age 46  . FULGURATION OF BLADDER TUMOR N/A 07/23/2016   Procedure: CYSTOSCOPY, BIOPSY AND FULGURATION OF BLADDER;  Surgeon: Cleon Gustin, MD;  Location: [facility];  Service: Urology;  Laterality: N/A;  . MASS EXCISION Right 04/07/2016   Procedure: EXCISION/BIOPSY SOFT TISSUE MASS RIGHT THIGH;  Surgeon: Aviva Signs, MD;  Location: [facility];  Service: General;  Laterality: Right;  . port a cath placed    . PORTACATH PLACEMENT Left 04/28/2016   Procedure: INSERTION PORT-A-CATH;  Surgeon: Aviva Signs, MD;  Location: [facility];  Service: General;  Laterality: Left;     No Known Allergies    Family History  Problem Relation Age of Onset  . Hypertension Mother   . Cancer Mother   . Hypertension Father   . Heart disease Maternal Grandmother   . Diabetes Paternal Grandmother      Social History Ms. Claassen reports that she has never smoked. She has never used smokeless tobacco. Ms. Liebig reports current alcohol use of about 1.0 standard drink of alcohol per week.   Review of Systems CONSTITUTIONAL: No weight loss, fever, chills, weakness or fatigue.  HEENT: Eyes: No visual loss, blurred vision, double vision or yellow sclerae.No hearing loss,  sneezing, congestion, runny nose or sore throat.  SKIN: No rash or itching.  CARDIOVASCULAR: per hpi RESPIRATORY: per hpi GASTROINTESTINAL: No anorexia, nausea, vomiting or diarrhea. No abdominal pain or blood.  GENITOURINARY: No burning on urination, no polyuria NEUROLOGICAL: No headache, dizziness, syncope, paralysis, ataxia, numbness or tingling in the extremities. No change in bowel or bladder control.  MUSCULOSKELETAL: No muscle, back pain, joint pain or stiffness.  LYMPHATICS: No enlarged nodes. No history of splenectomy.  PSYCHIATRIC: No history of depression or anxiety.  ENDOCRINOLOGIC: No reports of sweating, cold or heat intolerance. No polyuria or polydipsia.  Marland Kitchen   Physical Examination Today's Vitals   09/06/20 1124  BP: 100/78  Pulse: 82  SpO2: 99%  Weight: 276 lb 6.4 oz (125.4 kg)  Height: 5\' 4"  (1.626 m)   Body mass index is 47.44 kg/m.  Gen: resting comfortably, no acute distress HEENT: no scleral icterus, pupils equal round and reactive, no palptable cervical adenopathy,  CV: RRR, no mrg, no jvd Resp: Clear to auscultation bilaterally GI: abdomen is soft, non-tender, non-distended, normal bowel sounds, no hepatosplenomegaly MSK: extremities are warm, no edema.  Skin: warm, no rash Neuro:  no focal deficits Psych: appropriate affect     Assessment and Plan  1. Chronic systolic heart failure - doing well without symptoms, continue current meds  2. HTN - at goal, continue current meds    F/u 6 months   Arnoldo Lenis, M.D.

## 2021-01-21 ENCOUNTER — Other Ambulatory Visit (HOSPITAL_COMMUNITY): Payer: Self-pay

## 2021-01-21 DIAGNOSIS — C8100 Nodular lymphocyte predominant Hodgkin lymphoma, unspecified site: Secondary | ICD-10-CM

## 2021-01-22 ENCOUNTER — Inpatient Hospital Stay (HOSPITAL_COMMUNITY): Payer: 59 | Attending: Hematology

## 2021-01-22 ENCOUNTER — Ambulatory Visit (HOSPITAL_COMMUNITY): Admission: RE | Admit: 2021-01-22 | Payer: 59 | Source: Ambulatory Visit

## 2021-01-30 ENCOUNTER — Ambulatory Visit (HOSPITAL_COMMUNITY): Payer: 59 | Admitting: Hematology

## 2022-10-10 ENCOUNTER — Other Ambulatory Visit: Payer: Self-pay | Admitting: Family Medicine

## 2022-10-31 ENCOUNTER — Encounter (HOSPITAL_COMMUNITY): Payer: Self-pay | Admitting: Internal Medicine

## 2022-10-31 ENCOUNTER — Other Ambulatory Visit: Payer: Self-pay

## 2022-10-31 DIAGNOSIS — C8100 Nodular lymphocyte predominant Hodgkin lymphoma, unspecified site: Secondary | ICD-10-CM

## 2022-10-31 NOTE — Progress Notes (Signed)
Order placed for CT CAP per Dr. Katragadda 

## 2022-11-03 ENCOUNTER — Encounter (HOSPITAL_COMMUNITY): Payer: Self-pay | Admitting: Internal Medicine

## 2022-11-06 ENCOUNTER — Encounter (HOSPITAL_COMMUNITY): Payer: Self-pay | Admitting: Internal Medicine

## 2022-11-10 ENCOUNTER — Inpatient Hospital Stay: Payer: BC Managed Care – PPO | Attending: Hematology

## 2022-11-10 ENCOUNTER — Ambulatory Visit (HOSPITAL_COMMUNITY)
Admission: RE | Admit: 2022-11-10 | Discharge: 2022-11-10 | Disposition: A | Discharge status: Home / Self Care | Payer: BC Managed Care – PPO | Source: Ambulatory Visit | Attending: Hematology | Admitting: Hematology

## 2022-11-10 DIAGNOSIS — L732 Hidradenitis suppurativa: Secondary | ICD-10-CM | POA: Insufficient documentation

## 2022-11-10 DIAGNOSIS — I110 Hypertensive heart disease with heart failure: Secondary | ICD-10-CM | POA: Insufficient documentation

## 2022-11-10 DIAGNOSIS — I509 Heart failure, unspecified: Secondary | ICD-10-CM | POA: Insufficient documentation

## 2022-11-10 DIAGNOSIS — C8100 Nodular lymphocyte predominant Hodgkin lymphoma, unspecified site: Secondary | ICD-10-CM

## 2022-11-10 DIAGNOSIS — C8108 Nodular lymphocyte predominant Hodgkin lymphoma, lymph nodes of multiple sites: Secondary | ICD-10-CM | POA: Diagnosis present

## 2022-11-10 LAB — CBC WITH DIFFERENTIAL/PLATELET
Abs Immature Granulocytes: 0.02 10*3/uL (ref 0.00–0.07)
Basophils Absolute: 0.1 10*3/uL (ref 0.0–0.1)
Basophils Relative: 1 %
Eosinophils Absolute: 0.3 10*3/uL (ref 0.0–0.5)
Eosinophils Relative: 4 %
HCT: 41.9 % (ref 36.0–46.0)
Hemoglobin: 13.1 g/dL (ref 12.0–15.0)
Immature Granulocytes: 0 %
Lymphocytes Relative: 29 %
Lymphs Abs: 2.2 10*3/uL (ref 0.7–4.0)
MCH: 26.1 pg (ref 26.0–34.0)
MCHC: 31.3 g/dL (ref 30.0–36.0)
MCV: 83.5 fL (ref 80.0–100.0)
Monocytes Absolute: 0.9 10*3/uL (ref 0.1–1.0)
Monocytes Relative: 12 %
Neutro Abs: 4.1 10*3/uL (ref 1.7–7.7)
Neutrophils Relative %: 54 %
Platelets: 339 10*3/uL (ref 150–400)
RBC: 5.02 MIL/uL (ref 3.87–5.11)
RDW: 13.9 % (ref 11.5–15.5)
WBC: 7.7 10*3/uL (ref 4.0–10.5)
nRBC: 0 % (ref 0.0–0.2)

## 2022-11-10 LAB — COMPREHENSIVE METABOLIC PANEL
ALT: 13 U/L (ref 0–44)
AST: 13 U/L — ABNORMAL LOW (ref 15–41)
Albumin: 3.5 g/dL (ref 3.5–5.0)
Alkaline Phosphatase: 38 U/L (ref 38–126)
Anion gap: 6 (ref 5–15)
BUN: 14 mg/dL (ref 6–20)
CO2: 26 mmol/L (ref 22–32)
Calcium: 9.5 mg/dL (ref 8.9–10.3)
Chloride: 103 mmol/L (ref 98–111)
Creatinine, Ser: 0.76 mg/dL (ref 0.44–1.00)
GFR, Estimated: >60 mL/min (ref >60)
Glucose, Bld: 94 mg/dL (ref 70–99)
Potassium: 3.6 mmol/L (ref 3.5–5.1)
Sodium: 135 mmol/L (ref 135–145)
Total Bilirubin: 0.3 mg/dL (ref 0.3–1.2)
Total Protein: 7.1 g/dL (ref 6.5–8.1)

## 2022-11-10 LAB — LACTATE DEHYDROGENASE: LDH: 87 U/L — ABNORMAL LOW (ref 98–192)

## 2022-11-10 MED ORDER — IOHEXOL 300 MG/ML  SOLN
100.0000 mL | Freq: Once | INTRAMUSCULAR | Status: AC | PRN
  Administered 2022-11-10: 100 mL via INTRAVENOUS

## 2022-11-13 ENCOUNTER — Inpatient Hospital Stay: Payer: BC Managed Care – PPO | Admitting: Oncology

## 2022-11-13 VITALS — BP 136/91 | HR 85 | Temp 98.2°F | Resp 16 | Wt 283.8 lb

## 2022-11-13 DIAGNOSIS — C8108 Nodular lymphocyte predominant Hodgkin lymphoma, lymph nodes of multiple sites: Secondary | ICD-10-CM | POA: Diagnosis not present

## 2022-11-13 DIAGNOSIS — C8100 Nodular lymphocyte predominant Hodgkin lymphoma, unspecified site: Secondary | ICD-10-CM | POA: Diagnosis not present

## 2022-11-13 DIAGNOSIS — L732 Hidradenitis suppurativa: Secondary | ICD-10-CM | POA: Diagnosis not present

## 2022-11-13 MED ORDER — CLINDAMYCIN HCL 300 MG PO CAPS: 300.0000 mg | ORAL_CAPSULE | Freq: Two times a day (BID) | ORAL | 0 refills | Status: DC

## 2022-11-13 NOTE — Progress Notes (Signed)
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  She has not been seen by oncology since 07/24/2020.  Presents to clinic today d/t some concern for possible enlarged lymph nodes she has noticed to left side and posterior neck. States she noted swelling a few weeks ago. She was evaluated by PCP who recommended Doxycycline for possible hidradenitis.  Additionally had a chest x-ray completed which some pulmonary nodules as well and has an enlarged heart.  Wanted to have the CT scan to evaluate.  Does not feel like the Doxy is helping much for her hidradenitis.  Prefers clindamycin.  States it is not uncommon for her to have a hidradenitis flare but just wanted to be certain it was not lymph nodes given history.  Feels that her symptoms have improved slightly since starting on the doxycycline although it is not going away as quickly as it normally does with clindamycin. Describes them as hard and tender with occasional drainage.   Imaging 11/10/22 showed bilateral stable axillary and pelvic adenopathy.  No new or progressive findings in the chest abdomen pelvis.  Continues to deny B symptoms including  appetite changes in appetite unexplained weight loss.   REVIEW OF SYSTEMS:  Review of Systems  Constitutional:  Positive for fatigue.  HENT:   Positive for lump/mass.   Musculoskeletal:  Positive for neck pain.  Hematological:  Positive for adenopathy.    PAST MEDICAL/SURGICAL HISTORY:  Past Medical History:  Diagnosis Date   Acute on chronic systolic CHF (congestive heart  failure) ([facility]) 10/28/2019   a. EF 45-50% by echo in 09/2018 b. EF at 45% by echo in 10/2019   Anxiety    Asthma    had since childhood; has not had to use inhaler or nebulizer for 8-9 years. Been doing well.   Cervical lymphadenopathy    left groin lymph node, axillary lymph nodes:  seen on PET scan 02/2018   Dyspnea    Headache(784.0)    usually controlled c Tylenol when not pregnant, but more painful headaches  during preg. due to hypertension.   Hidradenitis suppurativa    Hodgkin lymphoma, nodular lymphocyte predominance ([facility]) 04/15/2016   Hypertension December 2011   Been on [place] since Dec. 2011   Iron deficiency 01/13/2017   Lymphoma, Hodgkin's ([facility])    Stage IV   Sleep apnea    Past Surgical History:  Procedure Laterality Date   ABDOMINAL HYSTERECTOMY  05/2017   ADENOIDECTOMY     age 39   BREAST RECONSTRUCTION  December 2001   breast reduction at age 79   FULGURATION OF BLADDER TUMOR N/A 07/23/2016   Procedure: CYSTOSCOPY, BIOPSY AND FULGURATION OF BLADDER;  Surgeon: Cleon Gustin, MD;  Location: [facility];  Service: Urology;  Laterality: N/A;   MASS EXCISION Right 04/07/2016   Procedure: EXCISION/BIOPSY SOFT TISSUE MASS RIGHT THIGH;  Surgeon: Aviva Signs, MD;  Location: [facility];  Service: General;  Laterality: Right;   port a cath placed     PORTACATH PLACEMENT Left 04/28/2016   Procedure: INSERTION PORT-A-CATH;  Surgeon: Aviva Signs, MD;  Location: [facility];  Service: General;  Laterality: Left;    SOCIAL HISTORY:  Social History   Socioeconomic History   Marital status: Married    Spouse name: Not on file   Number of children: Not on file   Years of education: Not on file   Highest education level: Not on file  Occupational History   Not on file  Tobacco Use   Smoking status: Never   Smokeless tobacco: Never  Vaping Use   Vaping Use: Never used  Substance and Sexual Activity   Alcohol use: Yes    Alcohol/week: 1.0 standard drink of alcohol    Types: 1  Glasses of wine per week   Drug use: No   Sexual activity: Yes    Birth control/protection: I.U.D.  Other Topics Concern   Not on file  Social History Narrative   Not on file   Social Determinants of Health   Financial Resource Strain: Medium Risk (07/24/2020)   Overall Financial Resource Strain (CARDIA)    Difficulty of Paying Living Expenses: Somewhat hard  Food Insecurity: No Food Insecurity (07/24/2020)   Hunger Vital Sign    Worried About Running Out of Food in the Last Year: Never true    Ran Out of Food in the Last Year: Never true  Transportation Needs: No Transportation Needs (07/24/2020)   PRAPARE - Hydrologist (Medical): No    Lack of Transportation (Non-Medical): No  Physical Activity: Sufficiently Active (07/24/2020)   Exercise Vital Sign    Days of Exercise per Week: 7 days    Minutes of Exercise per Session: 150+ min  Stress: Stress Concern Present (07/24/2020)   [place]    Feeling of Stress : Very much  Social Connections: Socially Integrated (07/24/2020)   Social Connection and Isolation Panel [NHANES]    Frequency of Communication with Friends and Family: More than three times a week    Frequency of Social Gatherings with Friends and Family: Once a week    Attends Religious Services: More than 4 times per year    Active Member of [employer] or Organizations: Yes    Attends Music therapist: More than 4 times per year    Marital Status: Married  Human resources officer Violence: Not At Risk (07/24/2020)   Humiliation, Afraid, Rape, and Kick questionnaire    Fear of Current or Ex-Partner: No    Emotionally Abused: No    Physically Abused: No    Sexually Abused: No    FAMILY HISTORY:  Family History  Problem Relation Age of Onset   Hypertension Mother    Cancer Mother    Hypertension Father    Heart disease Maternal Grandmother    Diabetes Paternal Grandmother      CURRENT MEDICATIONS:  Current Outpatient Medications  Medication Sig Dispense Refill   albuterol (VENTOLIN HFA) 108 (90 Base) MCG/ACT inhaler Inhale 1-2 puffs into the lungs every 6 (six) hours as needed for wheezing or shortness of breath.      carvedilol (COREG) 6.25 MG tablet Take 1 tablet (6.25 mg total) by mouth 2 (two) times daily with a meal. 60 tablet 6   clindamycin (CLEOCIN) 300 MG capsule Take 1 capsule (300 mg total) by mouth in the morning and at bedtime. 28 capsule 0   COVID-19 mRNA vaccine, Moderna, 100 MCG/0.5ML SUSP Inject 0.5 mLs into the muscle once.     doxycycline (VIBRA-TABS) 100 MG tablet Take 100 mg by mouth 2 (two) times  daily.     DULoxetine HCl 40 MG CPEP Take 1 capsule by mouth daily.     furosemide (LASIX) 40 MG tablet Take 1 tablet (40 mg total) by mouth daily. 90 tablet 1   lisinopril (ZESTRIL) 5 MG tablet TAKE 1 TABLET ONCE DAILY. 90 tablet 1   potassium chloride SA (KLOR-CON) 20 MEQ tablet TAKE 1 TABLET BY MOUTH ONCE DAILY. 30 tablet 0   Vitamin D, Ergocalciferol, (DRISDOL) 50000 units CAPS capsule Take 50,000 Units by mouth every Monday. Takes on Mondays.     No current facility-administered medications for this visit.    ALLERGIES:  No Known Allergies  PHYSICAL EXAM:  Performance status (ECOG): 0 - Asymptomatic  Vitals:   11/13/22 1310  BP: (!) 136/91  Pulse: 85  Resp: 16  Temp: 98.2 F (36.8 C)  SpO2: 95%   Wt Readings from Last 3 Encounters:  11/13/22 283 lb 12.8 oz (128.7 kg)  09/06/20 276 lb 6.4 oz (125.4 kg)  07/24/20 281 lb 14.4 oz (127.9 kg)   Physical Exam Vitals reviewed.  Constitutional:      Appearance: Normal appearance.  Cardiovascular:     Rate and Rhythm: Normal rate and regular rhythm.  Pulmonary:     Effort: Pulmonary effort is normal.     Breath sounds: Normal breath sounds.  Abdominal:     Palpations: Abdomen is soft. There is no mass.     Tenderness: There is no abdominal tenderness.  Musculoskeletal:      Right lower leg: No edema.     Left lower leg: No edema.  Lymphadenopathy:     Cervical: Cervical adenopathy present.     Left cervical: Posterior cervical adenopathy present.     Upper Body:     Right upper body: Axillary adenopathy present.     Left upper body: No axillary adenopathy.  Skin:    Comments: Mild erythema and heat to left neck tunneling to the back of her neck. No drainage.   Neurological:     General: No focal deficit present.     Mental Status: She is alert and oriented to person, place, and time.  Psychiatric:        Mood and Affect: Mood normal.        Behavior: Behavior normal.     LABORATORY DATA:  I have reviewed the labs as listed.     Latest Ref Rng & Units 11/10/2022    1:51 PM 07/20/2020   11:24 AM 12/22/2019    9:51 AM  CBC  WBC 4.0 - 10.5 K/uL 7.7  6.6  7.1   Hemoglobin 12.0 - 15.0 g/dL 13.1  12.5  13.2   Hematocrit 36.0 - 46.0 % 41.9  40.5  43.7   Platelets 150 - 400 K/uL 339  357  371       Latest Ref Rng & Units 11/10/2022    1:51 PM 07/20/2020   11:24 AM 12/22/2019    9:51 AM  CMP  Glucose 70 - 99 mg/dL 94  90  91   BUN 6 - 20 mg/dL 14  11  16    Creatinine 0.44 - 1.00 mg/dL 0.76  0.69  0.80   Sodium 135 - 145 mmol/L 135  138  137   Potassium 3.5 - 5.1 mmol/L 3.6  3.8  4.1   Chloride 98 - 111 mmol/L 103  106  103   CO2 22 - 32 mmol/L 26  25  27    Calcium 8.9 - 10.3  mg/dL 9.5  9.1  9.7   Total Protein 6.5 - 8.1 g/dL 7.1  7.1  7.5   Total Bilirubin 0.3 - 1.2 mg/dL 0.3  0.6  0.4   Alkaline Phos 38 - 126 U/L 38  32  41   AST 15 - 41 U/L 13  13  16    ALT 0 - 44 U/L 13  15  21        Component Value Date/Time   RBC 5.02 11/10/2022 1351   MCV 83.5 11/10/2022 1351   MCH 26.1 11/10/2022 1351   MCHC 31.3 11/10/2022 1351   RDW 13.9 11/10/2022 1351   LYMPHSABS 2.2 11/10/2022 1351   MONOABS 0.9 11/10/2022 1351   EOSABS 0.3 11/10/2022 1351   BASOSABS 0.1 11/10/2022 1351    DIAGNOSTIC IMAGING:  I have independently reviewed the scans and  discussed with the patient. CT CHEST ABDOMEN PELVIS W CONTRAST  Result Date: 11/10/2022 CLINICAL DATA:  History of nodular lymphocyte predominant Hodgkin's lymphoma. Follow-up. * Tracking Code: BO * EXAM: CT CHEST, ABDOMEN, AND PELVIS WITH CONTRAST TECHNIQUE: Multidetector CT imaging of the chest, abdomen and pelvis was performed following the standard protocol during bolus administration of intravenous contrast. RADIATION DOSE REDUCTION: This exam was performed according to the departmental dose-optimization program which includes automated exposure control, adjustment of the mA and/or kV according to patient size and/or use of iterative reconstruction technique. CONTRAST:  1104mL OMNIPAQUE IOHEXOL 300 MG/ML  SOLN COMPARISON:  Multiple priors including most recent CT July 23, 2020. FINDINGS: CT CHEST FINDINGS Cardiovascular: Left chest Port-A-Cath with tip in the SVC. Normal caliber thoracic aorta. Normal size heart. No significant pericardial effusion/thickening. Mediastinum/Nodes: No significant interval change in the enlarged bilateral axillary lymph nodes and prominent mediastinal lymph nodes. Previously indexed lymph nodes are as follows: -right axillary lymph node measures 2.0 cm in short axis on image 17/2 previously 1.9 cm. -left axillary lymph node measures 15 mm in short axis on image 17/2 previously 1.6 cm. Gas fluid level in a patulous esophagus. Lungs/Pleura: Stable 3 mm right upper lobe pulmonary nodule on image 40/3. Stable 6 mm left lower lobe pulmonary nodule on image 82/3. Mosaic attenuation of the lungs with mild diffuse bronchial wall thickening. No pleural effusion. Musculoskeletal: No aggressive lytic or blastic lesion of bone. CT ABDOMEN PELVIS FINDINGS Hepatobiliary: No suspicious hepatic lesion. Gallbladder is unremarkable. No biliary ductal dilation. Pancreas: No pancreatic ductal dilation or evidence of acute inflammation. Spleen: No splenomegaly or focal splenic lesion.  Adrenals/Urinary Tract: Bilateral adrenal glands appear normal. No hydronephrosis. Kidneys demonstrate symmetric enhancement. Urinary bladder is unremarkable for degree of distension. Stomach/Bowel: Radiopaque enteric contrast material traverses the hepatic flexure. Stomach is unremarkable for degree of distension. No pathologic dilation of small or large bowel. Normal appendix. No evidence of acute bowel inflammation. Vascular/Lymphatic: Normal caliber abdominal aorta. Smooth IVC contours. The portal, splenic and superior mesenteric veins are patent. Previously indexed lymph nodes are as follows: -right external iliac lymph node measures 11 mm in short axis on image 108/2 previously 13 mm. -left external iliac lymph node measures 15 mm in short axis on image 104/2, unchanged. -left inguinal lymph node measures 14 mm in short axis on image 129/2, unchanged. Reproductive: Status post hysterectomy. No adnexal masses. Other: No significant abdominopelvic free fluid. Musculoskeletal: No aggressive lytic or blastic lesion of bone. IMPRESSION: 1. Similar bilateral axillary and pelvic adenopathy. No new or progressive findings in the chest, abdomen or pelvis. 2. No splenomegaly. 3. Mosaic attenuation of the lungs  with mild diffuse bronchial wall thickening, suggestive of small airways disease. 4. Gas fluid level in a patulous esophagus, suggestive of gastroesophageal reflux. Electronically Signed   By: Dahlia Bailiff M.D.   On: 11/10/2022 18:21      ASSESSMENT:  1.  Nodular lymphocyte predominant Hodgkin's lymphoma: -Presentation with right inguinal lymphadenopathy, and night sweats.  Excisional biopsy on 04/07/2016 -PET scan on 04/25/2016 showed hypermetabolic enlarged bilateral axillary, bilateral external iliac and bilateral inguinal lymph nodes. -6 cycles of R-CHOP from 05/05/2016 through 08/19/2016. -She had some residual adenopathy at the end of chemotherapy which have appeared to have slightly increased on  subsequent scans. -She also has hidradenitis in her axilla which could also contribute to the waxing and waning adenopathy. -PET scan on 03/08/2018 showed hypermetabolic right axillary lymph node measuring 1.8 cm, previously 1.2 cm with SUV 11, previously 6.2.  No abdominal adenopathy or organ involvement.  Hypermetabolic left groin and external iliac lymph nodes are more or less stable. -CT CAP on 12/22/2019 showed left axillary lymph node measuring 1.9 cm, previously 1.0 cm.  Right axillary lymph node is more or less stable.  Left and right external iliac lymph nodes are stable.  Mild cardiomegaly. -CT scan from 11/10/22 showed similar bilateral axillary and pelvic adenopathy. No new or progressive findings in the chest, abdomen or pelvis. No splenomegaly. Mosaic attenuation of the lungs with mild diffuse bronchial wall thickening, suggestive of small airways disease. Gas fluid level in a patulous esophagus, suggestive of gastroesophageal reflux.    PLAN:  1.  Nodular lymphocyte predominant Hodgkin's lymphoma: -She does not report any night sweats at this time. -I have reviewed scans with her.   -Lab work from today is stable.  Normal LDH and CMP. -Recommend follow-up in 6-12 months with repeat scan and labs.  2.  CHF: -Admitted from 10/27/2019 through 10/30/2019 -2D echo on 10/28/2019 shows EF 45%.  LV mildly decreased function.  LV demonstrates global hypokinesis. -Continue Coreg 6.25 mg twice daily.  Continue Lasix 40 mg by mouth daily.  Continue lisinopril 5 mg daily. -Discussed that heart appears normal in size.  3.  Hidradenitis: -She is on doxycycline secondary to hidradenitis but prefers Clindamycin. New Rx send in for clindamycin 300 mg TID x 14 days.   Disposition: -RTC in 6-12 months with repeat chest abdomen pelvis, labs and MD assessment.  Greater than 50% was spent in counseling and coordination of care with this patient including but not limited to discussion of the relevant  topics above (See A&P) including, but not limited to diagnosis and management of acute and chronic medical conditions.   Orders placed this encounter:  Orders Placed This Encounter  Procedures   CT CHEST [place], NP 11/17/2022 10:18 AM  [facility] [phone]

## 2022-11-17 ENCOUNTER — Encounter (HOSPITAL_COMMUNITY): Payer: Self-pay | Admitting: Internal Medicine

## 2022-11-18 ENCOUNTER — Encounter: Payer: Self-pay | Admitting: Oncology

## 2022-11-18 NOTE — Telephone Encounter (Signed)
Would you like to address this Anderson Malta or refer her to PCP?

## 2022-11-19 ENCOUNTER — Inpatient Hospital Stay: Payer: BC Managed Care – PPO | Attending: Physician Assistant

## 2022-11-19 DIAGNOSIS — C8108 Nodular lymphocyte predominant Hodgkin lymphoma, lymph nodes of multiple sites: Secondary | ICD-10-CM | POA: Insufficient documentation

## 2022-11-19 DIAGNOSIS — Z452 Encounter for adjustment and management of vascular access device: Secondary | ICD-10-CM | POA: Diagnosis not present

## 2022-11-19 MED ORDER — HEPARIN SOD (PORK) LOCK FLUSH 100 UNIT/ML IV SOLN
500.0000 [IU] | Freq: Once | INTRAVENOUS | Status: AC
  Administered 2022-11-19: 500 [IU] via INTRAVENOUS

## 2022-11-19 MED ORDER — SODIUM CHLORIDE 0.9% FLUSH
10.0000 mL | INTRAVENOUS | Status: AC
  Administered 2022-11-19: 10 mL

## 2022-11-19 NOTE — Patient Instructions (Signed)
[facility]  Discharge Instructions: Thank you for choosing [facility] to provide your oncology and hematology care.  If you have a lab appointment with the [facility] - please note that after April 8th, 2024, all labs will be drawn in the cancer center.  You do not have to check in or register with the main entrance as you have in the past but will complete your check-in in the cancer center.  Wear comfortable clothing and clothing appropriate for easy access to any Portacath or PICC line.   We strive to give you quality time with your provider. You may need to reschedule your appointment if you arrive late (15 or more minutes).  Arriving late affects you and other patients whose appointments are after yours.  Also, if you miss three or more appointments without notifying the office, you may be dismissed from the clinic at the provider's discretion.      For prescription refill requests, have your pharmacy contact our office and allow 72 hours for refills to be completed.    Today you received the following : Port Flush.       To help prevent nausea and vomiting after your treatment, we encourage you to take your nausea medication as directed.  BELOW ARE SYMPTOMS THAT SHOULD BE REPORTED IMMEDIATELY: *FEVER GREATER THAN 100.4 F (38 C) OR HIGHER *CHILLS OR SWEATING *NAUSEA AND VOMITING THAT IS NOT CONTROLLED WITH YOUR NAUSEA MEDICATION *UNUSUAL SHORTNESS OF BREATH *UNUSUAL BRUISING OR BLEEDING *URINARY PROBLEMS (pain or burning when urinating, or frequent urination) *BOWEL PROBLEMS (unusual diarrhea, constipation, pain near the anus) TENDERNESS IN MOUTH AND THROAT WITH OR WITHOUT PRESENCE OF ULCERS (sore throat, sores in mouth, or a toothache) UNUSUAL RASH, SWELLING OR PAIN  UNUSUAL VAGINAL DISCHARGE OR ITCHING   Items with * indicate a potential emergency and should be followed up as soon as possible or go to the Emergency Department if any problems  should occur.  Please show the CHEMOTHERAPY ALERT CARD or IMMUNOTHERAPY ALERT CARD at check-in to the Emergency Department and triage nurse.  Should you have questions after your visit or need to cancel or reschedule your appointment, please contact [facility] [phone]  and follow the prompts.  Office hours are 8:00 a.m. to 4:30 p.m. Monday - Friday. Please note that voicemails left after 4:00 p.m. may not be returned until the following business day.  We are closed weekends and major holidays. You have access to a nurse at all times for urgent questions. Please call the main number to the clinic [phone] and follow the prompts.  For any non-urgent questions, you may also contact your provider using MyChart. We now offer e-Visits for anyone 33 and older to request care online for non-urgent symptoms. For details visit [email or web address].   Also download the MyChart app! Go to the app store, search "MyChart", open the app, select [facility], and log in with your MyChart username and password.

## 2022-11-19 NOTE — Progress Notes (Signed)
Raford Pitcher presented for Portacath access and flush.  Portacath located right chest wall accessed with  H 20 needle.  Good blood return present. Portacath flushed with 39ml NS and 500U/52ml Heparin and needle removed intact.  Procedure tolerated well and without incident. No complaints at this time. Discharged from clinic ambulatory in stable condition. Alert and oriented x 3. F/U with [facility] as scheduled.

## 2022-11-20 ENCOUNTER — Other Ambulatory Visit: Payer: Self-pay | Admitting: Oncology

## 2022-11-20 MED ORDER — PANTOPRAZOLE SODIUM 20 MG PO TBEC: 20.0000 mg | DELAYED_RELEASE_TABLET | Freq: Every day | ORAL | 1 refills | Status: AC

## 2022-11-20 NOTE — Telephone Encounter (Signed)
Would you like for me to send script for you?

## 2023-01-20 ENCOUNTER — Encounter (HOSPITAL_COMMUNITY): Payer: Self-pay | Admitting: Internal Medicine

## 2023-02-13 ENCOUNTER — Inpatient Hospital Stay: Payer: BC Managed Care – PPO | Attending: Physician Assistant

## 2023-05-06 ENCOUNTER — Other Ambulatory Visit: Payer: Self-pay

## 2023-05-06 DIAGNOSIS — C8100 Nodular lymphocyte predominant Hodgkin lymphoma, unspecified site: Secondary | ICD-10-CM

## 2023-05-15 ENCOUNTER — Inpatient Hospital Stay: Payer: BC Managed Care – PPO | Attending: Physician Assistant

## 2023-08-17 ENCOUNTER — Encounter (HOSPITAL_COMMUNITY): Payer: Self-pay | Admitting: Internal Medicine

## 2023-08-21 ENCOUNTER — Inpatient Hospital Stay: Payer: Medicaid Other | Attending: Physician Assistant

## 2023-10-30 ENCOUNTER — Encounter (HOSPITAL_COMMUNITY): Payer: Self-pay | Admitting: Internal Medicine

## 2023-11-04 ENCOUNTER — Other Ambulatory Visit: Payer: Self-pay | Admitting: Oncology

## 2023-11-04 DIAGNOSIS — C8100 Nodular lymphocyte predominant Hodgkin lymphoma, unspecified site: Secondary | ICD-10-CM

## 2023-11-05 ENCOUNTER — Inpatient Hospital Stay: Payer: Medicaid Other

## 2023-11-06 ENCOUNTER — Ambulatory Visit (HOSPITAL_COMMUNITY): Payer: Medicaid Other

## 2023-11-12 ENCOUNTER — Inpatient Hospital Stay: Payer: Medicaid Other | Admitting: Hematology

## 2024-01-04 ENCOUNTER — Encounter (HOSPITAL_COMMUNITY): Payer: Self-pay | Admitting: Internal Medicine

## 2024-01-07 ENCOUNTER — Encounter (HOSPITAL_COMMUNITY): Payer: Self-pay | Admitting: Internal Medicine

## 2024-01-08 ENCOUNTER — Encounter (HOSPITAL_COMMUNITY): Payer: Self-pay | Admitting: Internal Medicine

## 2024-01-12 ENCOUNTER — Ambulatory Visit (HOSPITAL_COMMUNITY): Attending: Oncology

## 2024-01-12 ENCOUNTER — Encounter (HOSPITAL_COMMUNITY): Payer: Self-pay | Admitting: Internal Medicine

## 2024-01-12 ENCOUNTER — Inpatient Hospital Stay: Attending: Physician Assistant

## 2024-01-19 ENCOUNTER — Inpatient Hospital Stay: Admitting: Hematology

## 2024-02-15 ENCOUNTER — Emergency Department (HOSPITAL_COMMUNITY)

## 2024-02-15 ENCOUNTER — Other Ambulatory Visit: Payer: Self-pay

## 2024-02-15 ENCOUNTER — Inpatient Hospital Stay (HOSPITAL_COMMUNITY)
Admission: EM | Admit: 2024-02-15 | Discharge: 2024-02-17 | DRG: 872 | Disposition: A | Discharge status: Home / Self Care | Source: Ambulatory Visit | Attending: Internal Medicine | Admitting: Internal Medicine

## 2024-02-15 ENCOUNTER — Telehealth: Payer: Self-pay | Admitting: *Deleted

## 2024-02-15 ENCOUNTER — Inpatient Hospital Stay (HOSPITAL_COMMUNITY)

## 2024-02-15 ENCOUNTER — Encounter (HOSPITAL_COMMUNITY): Payer: Self-pay

## 2024-02-15 DIAGNOSIS — I110 Hypertensive heart disease with heart failure: Secondary | ICD-10-CM | POA: Diagnosis present

## 2024-02-15 DIAGNOSIS — E876 Hypokalemia: Secondary | ICD-10-CM | POA: Diagnosis present

## 2024-02-15 DIAGNOSIS — Z79899 Other long term (current) drug therapy: Secondary | ICD-10-CM | POA: Diagnosis not present

## 2024-02-15 DIAGNOSIS — J45909 Unspecified asthma, uncomplicated: Secondary | ICD-10-CM | POA: Diagnosis present

## 2024-02-15 DIAGNOSIS — G4730 Sleep apnea, unspecified: Secondary | ICD-10-CM | POA: Diagnosis present

## 2024-02-15 DIAGNOSIS — Z8249 Family history of ischemic heart disease and other diseases of the circulatory system: Secondary | ICD-10-CM

## 2024-02-15 DIAGNOSIS — I428 Other cardiomyopathies: Secondary | ICD-10-CM | POA: Diagnosis present

## 2024-02-15 DIAGNOSIS — C8100 Nodular lymphocyte predominant Hodgkin lymphoma, unspecified site: Secondary | ICD-10-CM | POA: Diagnosis not present

## 2024-02-15 DIAGNOSIS — I5022 Chronic systolic (congestive) heart failure: Secondary | ICD-10-CM | POA: Diagnosis present

## 2024-02-15 DIAGNOSIS — Z6841 Body Mass Index (BMI) 40.0 and over, adult: Secondary | ICD-10-CM

## 2024-02-15 DIAGNOSIS — L03111 Cellulitis of right axilla: Secondary | ICD-10-CM | POA: Diagnosis present

## 2024-02-15 DIAGNOSIS — L732 Hidradenitis suppurativa: Secondary | ICD-10-CM | POA: Diagnosis present

## 2024-02-15 DIAGNOSIS — Z7984 Long term (current) use of oral hypoglycemic drugs: Secondary | ICD-10-CM

## 2024-02-15 DIAGNOSIS — Z833 Family history of diabetes mellitus: Secondary | ICD-10-CM

## 2024-02-15 DIAGNOSIS — I10 Essential (primary) hypertension: Secondary | ICD-10-CM | POA: Diagnosis present

## 2024-02-15 DIAGNOSIS — E662 Morbid (severe) obesity with alveolar hypoventilation: Secondary | ICD-10-CM | POA: Diagnosis present

## 2024-02-15 DIAGNOSIS — G4733 Obstructive sleep apnea (adult) (pediatric): Secondary | ICD-10-CM | POA: Diagnosis present

## 2024-02-15 DIAGNOSIS — A419 Sepsis, unspecified organism: Secondary | ICD-10-CM | POA: Diagnosis present

## 2024-02-15 DIAGNOSIS — C8190 Hodgkin lymphoma, unspecified, unspecified site: Secondary | ICD-10-CM | POA: Diagnosis present

## 2024-02-15 DIAGNOSIS — L03113 Cellulitis of right upper limb: Principal | ICD-10-CM | POA: Diagnosis present

## 2024-02-15 DIAGNOSIS — Z90710 Acquired absence of both cervix and uterus: Secondary | ICD-10-CM | POA: Diagnosis not present

## 2024-02-15 LAB — COMPREHENSIVE METABOLIC PANEL WITH GFR
ALT: 21 U/L (ref 0–44)
AST: 21 U/L (ref 15–41)
Albumin: 3.2 g/dL — ABNORMAL LOW (ref 3.5–5.0)
Alkaline Phosphatase: 38 U/L (ref 38–126)
Anion gap: 11 (ref 5–15)
BUN: 9 mg/dL (ref 6–20)
CO2: 22 mmol/L (ref 22–32)
Calcium: 9.1 mg/dL (ref 8.9–10.3)
Chloride: 104 mmol/L (ref 98–111)
Creatinine, Ser: 0.67 mg/dL (ref 0.44–1.00)
GFR, Estimated: >60 mL/min (ref >60)
Glucose, Bld: 89 mg/dL (ref 70–99)
Potassium: 3.5 mmol/L (ref 3.5–5.1)
Sodium: 137 mmol/L (ref 135–145)
Total Bilirubin: 0.3 mg/dL (ref 0.0–1.2)
Total Protein: 7 g/dL (ref 6.5–8.1)

## 2024-02-15 LAB — LACTIC ACID, PLASMA
Lactic Acid, Venous: 1.2 mmol/L (ref 0.5–1.9)
Lactic Acid, Venous: 0.7 mmol/L (ref 0.5–1.9)

## 2024-02-15 LAB — URINALYSIS, W/ REFLEX TO CULTURE (INFECTION SUSPECTED)
Bacteria, UA: NONE SEEN
Bilirubin Urine: NEGATIVE
Glucose, UA: NEGATIVE mg/dL
Hgb urine dipstick: NEGATIVE
Ketones, ur: NEGATIVE mg/dL
Leukocytes,Ua: NEGATIVE
Nitrite: NEGATIVE
Protein, ur: 30 mg/dL — AB
Specific Gravity, Urine: 1.026 (ref 1.005–1.030)
pH: 5 (ref 5.0–8.0)

## 2024-02-15 LAB — CBC WITH DIFFERENTIAL/PLATELET
Abs Immature Granulocytes: 0.04 10*3/uL (ref 0.00–0.07)
Basophils Absolute: 0.1 10*3/uL (ref 0.0–0.1)
Basophils Relative: 0 %
Eosinophils Absolute: 0.4 10*3/uL (ref 0.0–0.5)
Eosinophils Relative: 3 %
HCT: 38.9 % (ref 36.0–46.0)
Hemoglobin: 12.5 g/dL (ref 12.0–15.0)
Immature Granulocytes: 0 %
Lymphocytes Relative: 19 %
Lymphs Abs: 2.3 10*3/uL (ref 0.7–4.0)
MCH: 28 pg (ref 26.0–34.0)
MCHC: 32.1 g/dL (ref 30.0–36.0)
MCV: 87 fL (ref 80.0–100.0)
Monocytes Absolute: 0.9 10*3/uL (ref 0.1–1.0)
Monocytes Relative: 8 %
Neutro Abs: 8.3 10*3/uL — ABNORMAL HIGH (ref 1.7–7.7)
Neutrophils Relative %: 70 %
Platelets: 390 10*3/uL (ref 150–400)
RBC: 4.47 MIL/uL (ref 3.87–5.11)
RDW: 13.4 % (ref 11.5–15.5)
WBC: 11.9 10*3/uL — ABNORMAL HIGH (ref 4.0–10.5)
nRBC: 0 % (ref 0.0–0.2)

## 2024-02-15 LAB — PREGNANCY, URINE: Preg Test, Ur: NEGATIVE

## 2024-02-15 MED ORDER — ONDANSETRON HCL 4 MG PO TABS: 4.0000 mg | ORAL_TABLET | Freq: Four times a day (QID) | ORAL | Status: DC | PRN

## 2024-02-15 MED ORDER — FUROSEMIDE 40 MG PO TABS
40.0000 mg | ORAL_TABLET | Freq: Every day | ORAL | Status: DC
  Administered 2024-02-15 – 2024-02-16 (×2): 40 mg via ORAL
  Filled 2024-02-15: qty 2
  Filled 2024-02-15 (×2): qty 1

## 2024-02-15 MED ORDER — ACETAMINOPHEN 500 MG PO TABS
1000.0000 mg | ORAL_TABLET | Freq: Once | ORAL | Status: AC
  Administered 2024-02-15: 1000 mg via ORAL
  Filled 2024-02-15: qty 2

## 2024-02-15 MED ORDER — POLYETHYLENE GLYCOL 3350 17 G PO PACK
17.0000 g | PACK | Freq: Every day | ORAL | Status: DC | PRN
Start: 2024-02-15 — End: 2024-02-17

## 2024-02-15 MED ORDER — ACETAMINOPHEN 650 MG RE SUPP: 650.0000 mg | Freq: Four times a day (QID) | RECTAL | Status: DC | PRN

## 2024-02-15 MED ORDER — ONDANSETRON HCL 4 MG/2ML IJ SOLN
4.0000 mg | Freq: Once | INTRAMUSCULAR | Status: AC
  Administered 2024-02-15: 4 mg via INTRAVENOUS
  Filled 2024-02-15: qty 2

## 2024-02-15 MED ORDER — SODIUM CHLORIDE 0.9 % IV SOLN
2.0000 g | INTRAVENOUS | Status: DC
  Administered 2024-02-16: 2 g via INTRAVENOUS
  Filled 2024-02-15: qty 20

## 2024-02-15 MED ORDER — FUROSEMIDE 40 MG PO TABS: 40.0000 mg | ORAL_TABLET | Freq: Every day | ORAL | Status: DC

## 2024-02-15 MED ORDER — IOHEXOL 300 MG/ML  SOLN
75.0000 mL | Freq: Once | INTRAMUSCULAR | Status: AC | PRN
  Administered 2024-02-15: 75 mL via INTRAVENOUS

## 2024-02-15 MED ORDER — GABAPENTIN 300 MG PO CAPS
300.0000 mg | ORAL_CAPSULE | Freq: Two times a day (BID) | ORAL | Status: DC
  Administered 2024-02-16 (×2): 300 mg via ORAL
  Filled 2024-02-15 (×2): qty 1

## 2024-02-15 MED ORDER — DULOXETINE HCL 20 MG PO CPEP
20.0000 mg | ORAL_CAPSULE | Freq: Every day | ORAL | Status: DC
  Administered 2024-02-16: 20 mg via ORAL
  Filled 2024-02-15 (×2): qty 1

## 2024-02-15 MED ORDER — VANCOMYCIN HCL IN DEXTROSE 1-5 GM/200ML-% IV SOLN
1000.0000 mg | Freq: Once | INTRAVENOUS | Status: AC
  Administered 2024-02-15: 1000 mg via INTRAVENOUS
  Filled 2024-02-15 (×2): qty 200

## 2024-02-15 MED ORDER — ONDANSETRON HCL 4 MG/2ML IJ SOLN: 4.0000 mg | Freq: Four times a day (QID) | INTRAMUSCULAR | Status: DC | PRN

## 2024-02-15 MED ORDER — CARVEDILOL 3.125 MG PO TABS: 6.2500 mg | ORAL_TABLET | Freq: Two times a day (BID) | ORAL | Status: DC

## 2024-02-15 MED ORDER — CARVEDILOL 3.125 MG PO TABS
6.2500 mg | ORAL_TABLET | Freq: Two times a day (BID) | ORAL | Status: DC
  Administered 2024-02-15 – 2024-02-16 (×3): 6.25 mg via ORAL
  Filled 2024-02-15 (×4): qty 2

## 2024-02-15 MED ORDER — ACETAMINOPHEN 325 MG PO TABS
650.0000 mg | ORAL_TABLET | Freq: Four times a day (QID) | ORAL | Status: DC | PRN
  Administered 2024-02-16 – 2024-02-17 (×2): 650 mg via ORAL
  Filled 2024-02-15 (×2): qty 2

## 2024-02-15 MED ORDER — HYDROMORPHONE HCL 1 MG/ML IJ SOLN
0.5000 mg | Freq: Once | INTRAMUSCULAR | Status: AC
  Administered 2024-02-15: 0.5 mg via INTRAVENOUS
  Filled 2024-02-15: qty 0.5

## 2024-02-15 MED ORDER — VANCOMYCIN HCL 1500 MG/300ML IV SOLN
1500.0000 mg | Freq: Once | INTRAVENOUS | Status: AC
  Administered 2024-02-15: 1500 mg via INTRAVENOUS
  Filled 2024-02-15: qty 300

## 2024-02-15 MED ORDER — SODIUM CHLORIDE 0.9 % IV SOLN
2.0000 g | Freq: Once | INTRAVENOUS | Status: AC
  Administered 2024-02-15: 2 g via INTRAVENOUS
  Filled 2024-02-15: qty 20

## 2024-02-15 MED ORDER — VANCOMYCIN HCL IN DEXTROSE 1-5 GM/200ML-% IV SOLN
1000.0000 mg | Freq: Two times a day (BID) | INTRAVENOUS | Status: DC
  Administered 2024-02-16 – 2024-02-17 (×3): 1000 mg via INTRAVENOUS
  Filled 2024-02-15 (×3): qty 200

## 2024-02-15 NOTE — Assessment & Plan Note (Addendum)
 Chest x-ray showing mild central vascular congestion, CT negative for congestion, no peripheral edema.  Reports compliance with medications.  Last echo 03/2020 EF 45 to 50%. -Resume home Lasix  40 mg daily -Resume carvedilol 

## 2024-02-15 NOTE — ED Provider Notes (Signed)
 [place] EMERGENCY DEPARTMENT AT [facility] Provider Note   CSN: [id number] Arrival date & time: 02/15/24  1610     Patient presents with: arm infection   [name] is a 38 y.o. female.   38 year old female with history of Hodgkin's lymphoma in remission, hypertension, NICM, and hidradenitis suppurativa who presents emergency department with right arm pain.  Patient reports that on Wednesday of last week she started having some right arm pain.  Says that she is also started developing some redness.  Feels generally weak.  Had temperature of 101.28F at home prior to arrival.  Took Tylenol  and ibuprofen  last night.  Went to her primary doctor who looked at her arm and referred her to the emergency department.  Did notify Dr. Mavis from general surgery prior to coming in.       Prior to Admission medications   Medication Sig Start Date End Date Taking? Authorizing Provider  albuterol  (VENTOLIN  HFA) 108 (90 Base) MCG/ACT inhaler Inhale 1-2 puffs into the lungs every 6 (six) hours as needed for wheezing or shortness of breath.  12/03/17  Yes [provider]  carvedilol  (COREG ) 6.25 MG tablet Take 1 tablet (6.25 mg total) by mouth 2 (two) times daily with a meal. 03/14/20  Yes Strader, [place] M, PA-C  cholecalciferol (VITAMIN D3) 25 MCG (1000 UNIT) tablet Take 1,000 Units by mouth daily.   Yes [provider]  DULoxetine HCl 40 MG CPEP Take 1 capsule by mouth daily. 10/30/22  Yes [provider]  furosemide  (LASIX ) 40 MG tablet Take 1 tablet (40 mg total) by mouth daily. 06/20/20  Yes Strader, [place] M, PA-C  gabapentin (NEURONTIN) 300 MG capsule Take 300 mg by mouth 2 (two) times daily. 02/13/24  Yes [provider]  lisinopril  (ZESTRIL ) 5 MG tablet TAKE 1 TABLET ONCE DAILY. Patient taking differently: Take 5 mg by mouth daily. 01/02/20  Yes Koneswaran, Suresh A, MD  metFORMIN (GLUCOPHAGE-XR) 500 MG 24 hr tablet Take 500 mg by mouth 2  (two) times daily. 12/20/23  Yes [provider]  pantoprazole  (PROTONIX ) 20 MG tablet Take 1 tablet (20 mg total) by mouth daily. Patient taking differently: Take 20 mg by mouth daily as needed for heartburn or indigestion. 11/20/22  Yes Geofm Delon BRAVO, NP  clindamycin  (CLEOCIN ) 300 MG capsule Take 1 capsule (300 mg total) by mouth in the morning and at bedtime. Patient not taking: Reported on 02/15/2024 11/13/22   Geofm Delon BRAVO, NP    Allergies: Patient has no known allergies.    Review of Systems  Updated Vital Signs BP (!) 143/127   Pulse 100   Temp 98.6 F (37 C) (Oral)   Resp (!) 28   Ht 5' 4 (1.626 m)   Wt 128.7 kg   LMP 02/02/2013   SpO2 96%   BMI 48.70 kg/m   Physical Exam Vitals and nursing note reviewed.  Constitutional:      General: She is not in acute distress.    Appearance: She is well-developed.  HENT:     Head: Normocephalic and atraumatic.     Right Ear: External ear normal.     Left Ear: External ear normal.     Nose: Nose normal.   Eyes:     Extraocular Movements: Extraocular movements intact.     Conjunctiva/sclera: Conjunctivae normal.     Pupils: Pupils are equal, round, and reactive to light.    Cardiovascular:     Rate and Rhythm: Regular  rhythm. Tachycardia present.     Heart sounds: No murmur heard. Pulmonary:     Effort: Pulmonary effort is normal. No respiratory distress.     Breath sounds: Normal breath sounds.   Musculoskeletal:     Cervical back: Normal range of motion and neck supple.     Comments: Significant erythema of the medial aspect of the right upper arm.  No subcutaneous emphysema or crepitance.  Firm 1.5 cm nodule palpated.   Skin:    General: Skin is warm and dry.     Findings: Erythema present.   Neurological:     Mental Status: She is alert and oriented to person, place, and time. Mental status is at baseline.   Psychiatric:        Mood and Affect: Mood normal.     (all labs ordered are listed,  but only abnormal results are displayed) Labs Reviewed  COMPREHENSIVE METABOLIC PANEL WITH GFR - Abnormal; Notable for the following components:      Result Value   Albumin 3.2 (*)    All other components within normal limits  URINALYSIS, W/ REFLEX TO CULTURE (INFECTION SUSPECTED) - Abnormal; Notable for the following components:   Protein, ur 30 (*)    All other components within normal limits  CBC WITH DIFFERENTIAL/PLATELET - Abnormal; Notable for the following components:   WBC 11.9 (*)    Neutro Abs 8.3 (*)    All other components within normal limits  CULTURE, BLOOD (ROUTINE X 2)  CULTURE, BLOOD (ROUTINE X 2)  LACTIC ACID, PLASMA  LACTIC ACID, PLASMA  PREGNANCY, URINE  CBC WITH DIFFERENTIAL/PLATELET  HEMOGLOBIN A1C    EKG: None  Radiology: CT CHEST W CONTRAST Result Date: 02/15/2024 CLINICAL DATA:  The right axillary cellulitis with drainage EXAM: CT CHEST WITH CONTRAST TECHNIQUE: Multidetector CT imaging of the chest was performed during intravenous contrast administration. RADIATION DOSE REDUCTION: This exam was performed according to the departmental dose-optimization program which includes automated exposure control, adjustment of the mA and/or kV according to patient size and/or use of iterative reconstruction technique. CONTRAST:  75mL OMNIPAQUE  IOHEXOL  300 MG/ML  SOLN COMPARISON:  11/10/2022 FINDINGS: Cardiovascular: Thoracic aorta and its branches are within normal limits. No cardiac enlargement is seen. The pulmonary artery as visualized shows no large central pulmonary embolus although not timed for embolus evaluation. Left chest wall port is noted in satisfactory position. Mediastinum/Nodes: Thoracic inlet is within normal limits. No hilar or mediastinal adenopathy is noted. Bilateral axillary adenopathy is noted slightly worse on the right than the left roughly similar to that seen on the prior exam. The degree of overlying skin thickening and inflammatory change has  increased however consistent with the given clinical history of cellulitis. No subcutaneous abscess is noted at this time. The esophagus is within normal limits. Lungs/Pleura: Lungs are well aerated bilaterally. No focal infiltrate or sizable effusion is seen. Scattered small pulmonary nodules are again noted and stable from the prior exam. The largest of these lies in the left lower lobe best seen on image number 86 of series 3 measuring up to 5 mm. These of been stable over multiple previous exams dating back to 2021. Given the long-term stability, no further follow-up is recommended. Upper Abdomen: Fatty infiltration of the liver is noted. No other focal abnormality in the abdomen is seen. Musculoskeletal: No chest wall abnormality. No acute or significant osseous findings. IMPRESSION: Changes consistent with the known history of right axillary cellulitis with associated lymphadenopathy. No discrete subcutaneous abscess is  seen. Scattered pulmonary nodule stable dating back to 2021 consistent with a benign etiology. No follow-up is recommended. Fatty liver. Electronically Signed   By: Oneil Devonshire M.D.   On: 02/15/2024 22:06   DG Chest 2 View Result Date: 02/15/2024 CLINICAL DATA:  Swollen lymph nodes chest pain EXAM: CHEST - 2 VIEW COMPARISON:  11/03/2022 FINDINGS: Left-sided central venous catheter tip over the brachiocephalic confluence. No acute airspace disease, pleural effusion or pneumothorax. Stable cardiomediastinal silhouette with some central vascular congestion. IMPRESSION: Mild central vascular congestion. Electronically Signed   By: Luke Bun M.D.   On: 02/15/2024 16:59     Procedures  EMERGENCY DEPARTMENT US  SOFT TISSUE INTERPRETATION Study: Limited Soft Tissue Ultrasound  INDICATIONS: Soft tissue infection Multiple views of the body part were obtained in real-time with a multi-frequency linear probe  PERFORMED BY: Myself IMAGES ARCHIVED?: No SIDE:Right  BODY PART:Upper  extremity and Axilla INTERPRETATION:  No abcess noted and Cellulitis present   Medications Ordered in the ED  vancomycin  (VANCOREADY) IVPB 1500 mg/300 mL (0 mg Intravenous Stopped 02/15/24 2136)    Followed by  vancomycin  (VANCOCIN ) IVPB 1000 mg/200 mL premix (1,000 mg Intravenous New Bag/Given 02/15/24 2148)  vancomycin  (VANCOCIN ) IVPB 1000 mg/200 mL premix (has no administration in time range)  cefTRIAXone  (ROCEPHIN ) 2 g in sodium chloride  0.9 % 100 mL IVPB (0 g Intravenous Stopped 02/15/24 1935)  acetaminophen  (TYLENOL ) tablet 1,000 mg (1,000 mg Oral Given 02/15/24 1818)  HYDROmorphone  (DILAUDID ) injection 0.5 mg (0.5 mg Intravenous Given 02/15/24 1817)  ondansetron  (ZOFRAN ) injection 4 mg (4 mg Intravenous Given 02/15/24 1817)  iohexol  (OMNIPAQUE ) 300 MG/ML solution 75 mL (75 mLs Intravenous Contrast Given 02/15/24 2129)    Clinical Course as of 02/15/24 2224  Mon Feb 15, 2024  2052 Dr Pearlean from hospitalist to evaluate for admission [RP]    Clinical Course User Index [RP] Yolande Lamar BROCKS, MD                                 Medical Decision Making Amount and/or Complexity of Data Reviewed Labs: ordered. Radiology: ordered.  Risk OTC drugs. Prescription drug management. Decision regarding hospitalization.   38 year old female with history of Hodgkin's lymphoma in remission, hypertension, NICM, and hidradenitis suppurativa who presents emergency department with right arm pain.   Initial Ddx:  Cellulitis, abscess, lymphadenopathy, hidradenitis suppurativa, sepsis  MDM/Course:  Patient presents emergency department with right arm pain.  Does have a history of hidradenitis suppurativa.  Also tells me that she has got a history of Hodgkin's lymphoma with some lymph nodes in that area around the cellulitis.  On exam is febrile and tachycardic.  Has obvious cellulitis of her arm.  Was initiated on septic protocol and started on vancomycin  and ceftriaxone .  Did have an ultrasound  of the bedside performed which shows cobblestoning and signs of cellulitis but no deep space infection or abscess.  Admitted to hospitalist for antibiotics.  Did discuss with Dr. Kallie from surgery who reports that she will see the patient in the morning.  This patient presents to the ED for concern of complaints listed in HPI, this involves an extensive number of treatment options, and is a complaint that carries with it a high risk of complications and morbidity. Disposition including potential need for admission considered.   Dispo: Admit to Floor  Additional history obtained from spouse Records reviewed Outpatient Clinic Notes The following labs were independently interpreted: Chemistry  and show no acute abnormality I personally reviewed and interpreted cardiac monitoring: sinus tachycardia I personally reviewed and interpreted the pt's EKG: see above for interpretation  I have reviewed the patients home medications and made adjustments as needed Consults: General Surgery and Hospitalist  Portions of this note were generated with Scientist, clinical (histocompatibility and immunogenetics). Dictation errors may occur despite best attempts at proofreading.     Final diagnoses:  Cellulitis of right upper extremity  Sepsis, due to unspecified organism, unspecified whether acute organ dysfunction present [facility])    ED Discharge Orders     None          Yolande Lamar BROCKS, MD 02/15/24 2224

## 2024-02-15 NOTE — H&P (Signed)
 History and Physical    TAVI GAUGHRAN FMW:[id number] DOB: 12/16/1985 DOA: 02/15/2024  PCP: [name], MD   Patient coming from: Home  I have personally briefly reviewed patient's old medical records in [facility]  Chief Complaint: Right arm swelling and pain  HPI: [name] is a 38 y.o. female with medical history significant for systolic CHF, Hodgkin's lymphoma in remission, hypertension, hidradenitis suppurativa OSA. Patient presented to the ED with complaints of swelling pain redness and drainage from her right arm that started 5 days ago.  She reports drainage of frank pus, blood and clear fluid.  She reports fever up to 101 today at her doctor's office which she took Tylenol  and ibuprofen .  No vomiting.  She reports similar infections in the past but not this severe. She reports mild difficulty breathing last night, but no increase in number of pillows, no significant leg swelling.  She is able to lie flat to sleep.  She reports compliance with medications.  ED Course: Febrile to 98.9.  Tachycardic heart rate 99-114.  Respirate rate 16-22.  Blood pressure systolic 139-155.  O2 sats greater than 95% on room air. WBC 11.9. Lactic acid 1.2>> 0.7 Chest x-ray showed mild central vascular congestion. Blood cultures obtained Started on IV Vanco and ceftriaxone  in ED. EDP did a bedside ultrasound and did not appreciate a fluid collection or fluctuance.  We also talked to Dr. Kallie who will see in consult in AM.  Review of Systems: As per HPI all other systems reviewed and negative.  Past Medical History:  Diagnosis Date   Acute on chronic systolic CHF (congestive heart failure) (HCC) 10/28/2019   a. EF 45-50% by echo in 09/2018 b. EF at 45% by echo in 10/2019   Anxiety    Asthma    had since childhood; has not had to use inhaler or nebulizer for 8-9 years. Been doing well.   Cervical lymphadenopathy    left groin lymph node, axillary lymph nodes:  seen on  PET scan 02/2018   Dyspnea    Headache(784.0)    usually controlled c Tylenol  when not pregnant, but more painful headaches  during preg. due to hypertension.   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 2011   Iron deficiency 01/13/2017   Lymphoma, Hodgkin's (HCC)    Stage IV   Sleep apnea     Past Surgical History:  Procedure Laterality Date   ABDOMINAL HYSTERECTOMY  05/2017   ADENOIDECTOMY     age 54   BREAST RECONSTRUCTION  December 2001   breast reduction at age 57   FULGURATION OF BLADDER TUMOR N/A 07/23/2016   Procedure: CYSTOSCOPY, BIOPSY AND FULGURATION OF BLADDER;  Surgeon: Belvie LITTIE Clara, MD;  Location: [facility];  Service: Urology;  Laterality: N/A;   MASS EXCISION Right 04/07/2016   Procedure: EXCISION/BIOPSY SOFT TISSUE MASS RIGHT THIGH;  Surgeon: Oneil Budge, MD;  Location: [facility];  Service: General;  Laterality: Right;   port a cath placed     PORTACATH PLACEMENT Left 04/28/2016   Procedure: INSERTION PORT-A-CATH;  Surgeon: Oneil Budge, MD;  Location: [facility];  Service: General;  Laterality: Left;     reports that she has never smoked. She has never used smokeless tobacco. She reports current alcohol use of about 1.0 standard drink of alcohol per week. She reports that she does not use drugs.  No  Known Allergies  Family History  Problem Relation Age of Onset   Hypertension Mother    Cancer Mother    Hypertension Father    Heart disease Maternal Grandmother    Diabetes Paternal Grandmother     Prior to Admission medications   Medication Sig Start Date End Date Taking? Authorizing Provider  albuterol  (VENTOLIN  HFA) 108 (90 Base) MCG/ACT inhaler Inhale 1-2 puffs into the lungs every 6 (six) hours as needed for wheezing or shortness of breath.  12/03/17  Yes [provider]  carvedilol  (COREG ) 6.25 MG tablet Take 1 tablet (6.25 mg total) by mouth 2  (two) times daily with a meal. 03/14/20  Yes Strader, [place] M, PA-C  cholecalciferol (VITAMIN D3) 25 MCG (1000 UNIT) tablet Take 1,000 Units by mouth daily.   Yes [provider]  DULoxetine HCl 40 MG CPEP Take 1 capsule by mouth daily. 10/30/22  Yes [provider]  furosemide  (LASIX ) 40 MG tablet Take 1 tablet (40 mg total) by mouth daily. 06/20/20  Yes Strader, [place] M, PA-C  gabapentin (NEURONTIN) 300 MG capsule Take 300 mg by mouth 2 (two) times daily. 02/13/24  Yes [provider]  lisinopril  (ZESTRIL ) 5 MG tablet TAKE 1 TABLET ONCE DAILY. Patient taking differently: Take 5 mg by mouth daily. 01/02/20  Yes Koneswaran, Suresh A, MD  metFORMIN (GLUCOPHAGE-XR) 500 MG 24 hr tablet Take 500 mg by mouth 2 (two) times daily. 12/20/23  Yes [provider]  pantoprazole  (PROTONIX ) 20 MG tablet Take 1 tablet (20 mg total) by mouth daily. Patient taking differently: Take 20 mg by mouth daily as needed for heartburn or indigestion. 11/20/22  Yes Geofm Delon BRAVO, NP  clindamycin  (CLEOCIN ) 300 MG capsule Take 1 capsule (300 mg total) by mouth in the morning and at bedtime. Patient not taking: Reported on 02/15/2024 11/13/22   Geofm Delon BRAVO, NP    Physical Exam: Vitals:   02/15/24 1915 02/15/24 1930 02/15/24 2000 02/15/24 2030  BP: (!) 139/105 (!) 154/102 (!) 153/102 (!) 151/97  Pulse: (!) 108 (!) 111 99 (!) 102  Resp: 16 (!) 22 (!) 21 19  Temp:      TempSrc:      SpO2: 97% 95% 98% 99%  Weight:      Height:        Constitutional: NAD, calm, comfortable Vitals:   02/15/24 1915 02/15/24 1930 02/15/24 2000 02/15/24 2030  BP: (!) 139/105 (!) 154/102 (!) 153/102 (!) 151/97  Pulse: (!) 108 (!) 111 99 (!) 102  Resp: 16 (!) 22 (!) 21 19  Temp:      TempSrc:      SpO2: 97% 95% 98% 99%  Weight:      Height:       Eyes: PERRL, lids and conjunctivae normal ENMT: Mucous membranes are moist.  Neck: normal, supple, no masses, no thyromegaly Respiratory: clear to  auscultation bilaterally, no wheezing, no crackles. Normal respiratory effort. No accessory muscle use.  Cardiovascular: Regular rate and rhythm, no murmurs / rubs / gallops. No extremity edema.   Abdomen: no tenderness, no masses palpated. No hepatosplenomegaly. Bowel sounds positive.  Musculoskeletal: no clubbing / cyanosis. No joint deformity upper and lower extremities.   Skin: Swelling,  some clear drainage from open wound lateral aspect of armpit, to palpation, indurated medial wall. Neurologic: No facial asymmetry, moving extremities spontaneously, speech fluent. Psychiatric: Normal judgment and insight. Alert and oriented x 3. Normal mood.   Labs on Admission: I have personally reviewed following  labs and imaging studies  CBC: Recent Labs  Lab 02/15/24 1944  WBC 11.9*  NEUTROABS 8.3*  HGB 12.5  HCT 38.9  MCV 87.0  PLT 390   Basic Metabolic Panel: Recent Labs  Lab 02/15/24 1751  NA 137  K 3.5  CL 104  CO2 22  GLUCOSE 89  BUN 9  CREATININE 0.67  CALCIUM 9.1   GFR: Estimated Creatinine Clearance: 125.6 mL/min (by C-G formula based on SCr of 0.67 mg/dL). Liver Function Tests: Recent Labs  Lab 02/15/24 1751  AST 21  ALT 21  ALKPHOS 38  BILITOT 0.3  PROT 7.0  ALBUMIN 3.2*   Urine analysis:    Component Value Date/Time   COLORURINE YELLOW 02/15/2024 1708   APPEARANCEUR CLEAR 02/15/2024 1708   LABSPEC 1.026 02/15/2024 1708   PHURINE 5.0 02/15/2024 1708   GLUCOSEU NEGATIVE 02/15/2024 1708   HGBUR NEGATIVE 02/15/2024 1708   BILIRUBINUR NEGATIVE 02/15/2024 1708   BILIRUBINUR neg 02/16/2013 1719   KETONESUR NEGATIVE 02/15/2024 1708   PROTEINUR 30 (A) 02/15/2024 1708   UROBILINOGEN 0.2 11/01/2014 1937   NITRITE NEGATIVE 02/15/2024 1708   LEUKOCYTESUR NEGATIVE 02/15/2024 1708    Radiological Exams on Admission: DG Chest 2 View Result Date: 02/15/2024 CLINICAL DATA:  Swollen lymph nodes chest pain EXAM: CHEST - 2 VIEW COMPARISON:  11/03/2022 FINDINGS:  Left-sided central venous catheter tip over the brachiocephalic confluence. No acute airspace disease, pleural effusion or pneumothorax. Stable cardiomediastinal silhouette with some central vascular congestion. IMPRESSION: Mild central vascular congestion. Electronically Signed   By: Luke Bun M.D.   On: 02/15/2024 16:59   EKG: None.   Assessment/Plan Principal Problem:   Right arm cellulitis Active Problems:   Sepsis (HCC)   Hypertension   Sleep apnea   Nonischemic cardiomyopathy (HCC)  Assessment and Plan: * Right arm cellulitis Right arm swelling, tenderness, drainage, induration-cellulitic, need to rule out underlying abscess.  History of hidradenitis suppurativa.  Meets sepsis criteria.  EDP used bedside ultrasound did not appreciate fluctuance or collection. -Follow-up blood cultures -IV Vancomycin  and ceftriaxone  -EDP talked to Dr. Kallie, will see in consult in AM. -Obtain CT chest imaging to evaluate the axilla -no discrete subcutaneous abscess seen, changes consistent with known history of right axillary cellulitis with associated lymphadenopathy. - HgbA1c   Sepsis (HCC) Sepsis secondary to right axillary cellulitis.  Meeting sepsis criteria with tachycardia heart rate 99-114, tachypnea respirate rate 16-22, leukocytosis of 11.9.  No evidence of endorgan dysfunction.  Lactic acid 1.2 >>0.7.  Chest x-ray shows mild central vascular congestion. - Follow-up blood cultures, IV antibiotics  Hodgkin lymphoma, nodular lymphocyte predominance (HCC) Follows with her oncologist Dr. Rogers.  Last visit 10/2022.  Status post R-CHOP 2017-2018.  Currently under surveillance.  Nonischemic cardiomyopathy (HCC) Chest x-ray showing mild central vascular congestion, CT negative for congestion, no peripheral edema.  Reports compliance with medications.  Last echo 03/2020 EF 45 to 50%. -Resume home Lasix  40 mg daily -Resume carvedilol   Hypertension Resume carvedilol , and Lasix -  with  mild vascular congestion on chest x-ray.   - Hold low-dose lisinopril  for contrast exposure.   DVT prophylaxis: SCDS for now, pending gen surg eval Code Status: FULL Family Communication: None at bedside, patients mother on patient's phone  Disposition Plan: ~ 2 days Consults called: Gen Surg Admission status: Inpt tele I certify that at the point of admission it is my clinical judgment that the patient will require inpatient hospital care spanning beyond 2 midnights from the point of admission due  to high intensity of service, high risk for further deterioration and high frequency of surveillance required.   Author: Tully FORBES Carwin, MD 02/15/2024 10:16 PM  For on call review [email or web address].

## 2024-02-15 NOTE — Assessment & Plan Note (Addendum)
 Follows with her oncologist Dr. Rogers.  Last visit 10/2022.  Status post R-CHOP 2017-2018.  Currently under surveillance.

## 2024-02-15 NOTE — ED Triage Notes (Signed)
 Pt reports having a fever of 101.72F PTA and reports taking OTC medications to keep her temperature under control and does report she has a Power Port in her left chest.

## 2024-02-15 NOTE — Assessment & Plan Note (Signed)
 Sepsis secondary to right axillary cellulitis.  Meeting sepsis criteria with tachycardia heart rate 99-114, tachypnea respirate rate 16-22, leukocytosis of 11.9.  No evidence of endorgan dysfunction.  Lactic acid 1.2 >>0.7.  Chest x-ray shows mild central vascular congestion. - Follow-up blood cultures, IV antibiotics

## 2024-02-15 NOTE — Progress Notes (Signed)
 Pharmacy Antibiotic Note  [name] is a 38 y.o. female admitted on 02/15/2024 with cellulitis. Patient presenting with increased redness and swelling to right arm. PMH significant for Hodgkin's lymphoma in remission, hypertension, NICM, and hidradenitis suppurativa. Pharmacy has been consulted for vancomycin  dosing.  Plan: Vancomycin  load of 2500 mg IV x1 in ED Start vancomycin  1000 mg IV every 12 hours (eAUC 431.3, Scr 0.8, IBW used, Vd 0.5 L/kg) Monitor renal function, clinical status, culture data, and LOT  Height: 5' 4 (162.6 cm) Weight: 128.7 kg (283 lb 11.7 oz) IBW/kg (Calculated) : 54.7  Temp (24hrs), Avg:98.8 F (37.1 C), Min:98.6 F (37 C), Max:98.9 F (37.2 C)  Recent Labs  Lab 02/15/24 1751  CREATININE 0.67  LATICACIDVEN 1.2    Estimated Creatinine Clearance: 125.6 mL/min (by C-G formula based on SCr of 0.67 mg/dL).    No Known Allergies  Antimicrobials this admission: vancomycin  6/30 >>   Dose adjustments this admission: N/A  Microbiology results: 6/30 BCx: pending  Thank you for involving pharmacy in this patient's care.   Damien Napoleon, PharmD Clinical Pharmacist 02/15/2024 5:41 PM

## 2024-02-15 NOTE — ED Triage Notes (Signed)
 Pt arrived via POV c/o recurrent hidradenitis suppurativa and swollen lymph nodules. Pt reports this flare up began on Thursday dn presents today reporting serosanguinous drainage from her upper right arm. Pt advised to come to the ER by PCP for possible consult with Dr Mavis.

## 2024-02-15 NOTE — Assessment & Plan Note (Addendum)
 Right arm swelling, tenderness, drainage, induration-cellulitic, need to rule out underlying abscess.  History of hidradenitis suppurativa.  Meets sepsis criteria.  EDP used bedside ultrasound did not appreciate fluctuance or collection. -Follow-up blood cultures -IV Vancomycin  and ceftriaxone  -EDP talked to Dr. Kallie, will see in consult in AM. -Obtain CT chest imaging to evaluate the axilla -no discrete subcutaneous abscess seen, changes consistent with known history of right axillary cellulitis with associated lymphadenopathy. - HgbA1c

## 2024-02-15 NOTE — Assessment & Plan Note (Signed)
 Resume carvedilol , and Lasix -  with mild vascular congestion on chest x-ray.   - Hold low-dose lisinopril  for contrast exposure.

## 2024-02-15 NOTE — Telephone Encounter (Signed)
 Received call from patient [phone]~ telephone.   Reports that she was seen by PCP in [place] today for abscess/ cellulitis. States that she has recurrent abscesses dues to hidradenitis. States that PCP did give her oral ABTx. Reports that PCP wanted to send her to ER as she is running a fever and has cellulitis around her arm. Reports that she has been seen by Dr. Mavis in the past for hidradenitis. Requested appointment to see Dr. Mavis for current infection.   Advised if infection is bad enough that the PCP wanted her seen in ER, she should follow that recommendation. Patient states that she will go to [facility] so that Dr. Mavis can consult if needed.   Advised that Dr. Kallie is on call this week and may be called to consult.

## 2024-02-16 DIAGNOSIS — A419 Sepsis, unspecified organism: Secondary | ICD-10-CM | POA: Diagnosis not present

## 2024-02-16 DIAGNOSIS — L732 Hidradenitis suppurativa: Secondary | ICD-10-CM | POA: Diagnosis not present

## 2024-02-16 DIAGNOSIS — L03113 Cellulitis of right upper limb: Principal | ICD-10-CM

## 2024-02-16 DIAGNOSIS — C8100 Nodular lymphocyte predominant Hodgkin lymphoma, unspecified site: Secondary | ICD-10-CM | POA: Diagnosis not present

## 2024-02-16 LAB — BASIC METABOLIC PANEL WITH GFR
Anion gap: 8 (ref 5–15)
BUN: 9 mg/dL (ref 6–20)
CO2: 23 mmol/L (ref 22–32)
Calcium: 8.6 mg/dL — ABNORMAL LOW (ref 8.9–10.3)
Chloride: 104 mmol/L (ref 98–111)
Creatinine, Ser: 0.66 mg/dL (ref 0.44–1.00)
GFR, Estimated: >60 mL/min (ref >60)
Glucose, Bld: 112 mg/dL — ABNORMAL HIGH (ref 70–99)
Potassium: 3.4 mmol/L — ABNORMAL LOW (ref 3.5–5.1)
Sodium: 135 mmol/L (ref 135–145)

## 2024-02-16 LAB — CBC
HCT: 38.3 % (ref 36.0–46.0)
Hemoglobin: 11.9 g/dL — ABNORMAL LOW (ref 12.0–15.0)
MCH: 27.2 pg (ref 26.0–34.0)
MCHC: 31.1 g/dL (ref 30.0–36.0)
MCV: 87.6 fL (ref 80.0–100.0)
Platelets: 377 10*3/uL (ref 150–400)
RBC: 4.37 MIL/uL (ref 3.87–5.11)
RDW: 13.3 % (ref 11.5–15.5)
WBC: 10.4 10*3/uL (ref 4.0–10.5)
nRBC: 0 % (ref 0.0–0.2)

## 2024-02-16 LAB — HIV ANTIBODY (ROUTINE TESTING W REFLEX): HIV Screen 4th Generation wRfx: NONREACTIVE

## 2024-02-16 LAB — HEMOGLOBIN A1C
Hgb A1c MFr Bld: 5.4 % (ref 4.8–5.6)
Mean Plasma Glucose: 108.28 mg/dL

## 2024-02-16 MED ORDER — OXYCODONE HCL 5 MG PO TABS
5.0000 mg | ORAL_TABLET | Freq: Once | ORAL | Status: AC | PRN
  Administered 2024-02-16: 5 mg via ORAL
  Filled 2024-02-16: qty 1

## 2024-02-16 MED ORDER — POTASSIUM CHLORIDE CRYS ER 20 MEQ PO TBCR
20.0000 meq | EXTENDED_RELEASE_TABLET | Freq: Once | ORAL | Status: AC
  Administered 2024-02-16: 20 meq via ORAL
  Filled 2024-02-16: qty 1

## 2024-02-16 NOTE — Hospital Course (Addendum)
 38 y/o female with a history of hypertension, hidradenitis suppurative, nonischemic cardiomyopathy, Hodgkin's lymphoma, morbid obesity, OSA, anxiety  with complaints of swelling pain redness and drainage from her right arm that started 5 days ago.  She reports drainage of frank pus, blood and clear fluid.  She reports fever up to 101 today at her doctor's office which she took Tylenol  and ibuprofen .  No vomiting.   ED Course: Febrile to 98.9.  Tachycardic heart rate 99-114.  Respirate rate 16-22.  Blood pressure systolic 139-155.  O2 sats greater than 95% on room air. WBC 11.9. Lactic acid 1.2>> 0.7 Chest x-ray showed mild central vascular congestion. Blood cultures obtained Started on IV Vanco and ceftriaxone  in ED. EDP did a bedside ultrasound and did not appreciate a fluid collection or fluctuance.  We also talked to Dr. Kallie consulted   Hidraadenitis suppurativa -previously on Clindagel  chronic systolic CHF -09/18/2018 echo EF 45-50%, mild global HK -10/28/19 Echo EF45%, global HK - 03/22/20 Echo-- EF45-50%, global HK., normal RVF -continue lisinopril  and coreg  -clinically compensated   Essential hypertension -Continue carvedilol  and lisinopril    Hodgkin's lymphoma -Follow-up outpatient with medical oncology -Initially diagnosed August 2017 -Status post 6 cycles of R-CHOP 05/05/16 to 08/19/16 --CT scan from 11/10/22 showed similar bilateral axillary and pelvic adenopathy. No new or progressive findings in the chest, abdomen or pelvis. No splenomegaly.  -follows Dr. Rogers   Morbid obesity -BMI 49.57 -Lifestyle modification   OSA/OHS -Continue nighttime CPAP

## 2024-02-16 NOTE — Plan of Care (Signed)
  Problem: Activity: Goal: Risk for activity intolerance will decrease Outcome: Adequate for Discharge   

## 2024-02-16 NOTE — Progress Notes (Signed)
   02/16/24 1038  TOC Brief Assessment  Insurance and Status Reviewed  Patient has primary care physician Yes  Home environment has been reviewed Home with Spouse  Prior level of function: Independent  Prior/Current Home Services No current home services  Social Drivers of Health Review SDOH reviewed no interventions necessary  Readmission risk has been reviewed Yes  Transition of care needs no transition of care needs at this time   Transition of Care Department [facility]) has reviewed patient and no TOC needs have been identified at this time. We will continue to monitor patient advancement through interdisciplinary progression rounds. If new patient transition needs arise, please place a TOC consult.

## 2024-02-16 NOTE — Progress Notes (Signed)
 PROGRESS NOTE  [name] FMW:[id number] DOB: 1986/03/23 DOA: 02/15/2024 PCP: [name], MD  Brief History:  38 y/o female with a history of hypertension, hidradenitis suppurative, nonischemic cardiomyopathy, Hodgkin's lymphoma, morbid obesity, OSA, anxiety  with complaints of swelling pain redness and drainage from her right arm that started 5 days ago.  She reports drainage of frank pus, blood and clear fluid.  She reports fever up to 101 today at her doctor's office which she took Tylenol  and ibuprofen .  No vomiting.   ED Course: Febrile to 98.9.  Tachycardic heart rate 99-114.  Respirate rate 16-22.  Blood pressure systolic 139-155.  O2 sats greater than 95% on room air. WBC 11.9. Lactic acid 1.2>> 0.7 Chest x-ray showed mild central vascular congestion. Blood cultures obtained Started on IV Vanco and ceftriaxone  in ED. EDP did a bedside ultrasound and did not appreciate a fluid collection or fluctuance.  We also talked to Dr. Kallie consulted   Hidraadenitis suppurativa -previously on Clindagel  chronic systolic CHF -09/18/2018 echo EF 45-50%, mild global HK -10/28/19 Echo EF45%, global HK - 03/22/20 Echo-- EF45-50%, global HK., normal RVF -continue lisinopril  and coreg  -clinically compensated   Essential hypertension -Continue carvedilol  and lisinopril    Hodgkin's lymphoma -Follow-up outpatient with medical oncology -Initially diagnosed August 2017 -Status post 6 cycles of R-CHOP 05/05/16 to 08/19/16 --CT scan from 11/10/22 showed similar bilateral axillary and pelvic adenopathy. No new or progressive findings in the chest, abdomen or pelvis. No splenomegaly.  -follows Dr. Rogers   Morbid obesity -BMI 49.57 -Lifestyle modification   OSA/OHS -Continue nighttime CPAP   Assessment/Plan: Sepsis -secondary to cellulitis right arm/axilla -tachycardia heart rate 99-114, tachypnea respirate rate 16-22, leukocytosis of 11.9.  -lactic  1.2>>0.7  R-arm cellulitis -EDP used bedside ultrasound did not appreciate fluctuance or collection.  -continue vanc, ceftriaxone  -CT chest imaging to evaluate the axilla -no discrete subcutaneous abscess seen, changes consistent with known history of right axillary cellulitis with associated lymphadenopathy.   Hidraadenitis suppurativa -previously on Clindagel  chronic systolic CHF -09/18/2018 echo EF 45-50%, mild global HK -10/28/19 Echo EF45%, global HK - 03/22/20 Echo-- EF45-50%, global HK., normal RVF -continue lisinopril  and coreg  -clinically compensated -resume home lasix    Essential hypertension -Continue carvedilol  and lisinopril    Hodgkin's lymphoma -Follow-up outpatient with medical oncology -Initially diagnosed August 2017 -Status post 6 cycles of R-CHOP 05/05/16 to 08/19/16 --CT scan from 11/10/22 showed similar bilateral axillary and pelvic adenopathy. No new or progressive findings in the chest, abdomen or pelvis. No splenomegaly.  -follows Dr. Rogers   Morbid obesity -BMI 49.57 -Lifestyle modification   OSA/OHS -Continue nighttime CPAP  Hypokalemia -replete -check mag       Family Communication:   Family at bedside 7/1  Consultants:  general surgery  Code Status:  FULL /  DVT Prophylaxis:  [place] Heparin  / [place] Lovenox    Procedures: As Listed in Progress Note Above  Antibiotics: Vanc 6/30>> Ceftriaxone  6/30>>     Subjective: Pt feels swelling in right arm is a bit better.  Denies cp, sob, n/v/d, abd pain  Objective: Vitals:   02/16/24 0238 02/16/24 0609 02/16/24 0818 02/16/24 1336  BP: (!) 102/56 (!) 91/56 (!) 134/91 (!) 150/113  Pulse: 91 79  94  Resp: 14     Temp: 97.8 F (36.6 C) 98.1 F (36.7 C)  97.8 F (36.6 C)  TempSrc: Oral Oral    SpO2: 100% 97%  98%  Weight:  Height:        Intake/Output Summary (Last 24 hours) at 02/16/2024 1744 Last data filed at 02/16/2024 0514 Gross per 24 hour  Intake 3.11 ml  Output --  Net  3.11 ml   Weight change:  Exam:  General:  Pt is alert, follows commands appropriately, not in acute distress HEENT: No icterus, No thrush, No neck mass, [place]/AT Cardiovascular: RRR, S1/S2, no rubs, no gallops Respiratory: CTA bilaterally, no wheezing, no crackles, no rhonchi Abdomen: Soft/+BS, non tender, non distended, no guarding Extremities: RUE edema and induration in bicep/tricep area, no crepitance, no necrosis   Data Reviewed: I have personally reviewed following labs and imaging studies Basic Metabolic Panel: Recent Labs  Lab 02/15/24 1751 02/16/24 0424  NA 137 135  K 3.5 3.4*  CL 104 104  CO2 22 23  GLUCOSE 89 112*  BUN 9 9  CREATININE 0.67 0.66  CALCIUM 9.1 8.6*   Liver Function Tests: Recent Labs  Lab 02/15/24 1751  AST 21  ALT 21  ALKPHOS 38  BILITOT 0.3  PROT 7.0  ALBUMIN 3.2*   No results for input(s): LIPASE, AMYLASE in the last 168 hours. No results for input(s): AMMONIA in the last 168 hours. Coagulation Profile: No results for input(s): INR, PROTIME in the last 168 hours. CBC: Recent Labs  Lab 02/15/24 1944 02/16/24 0424  WBC 11.9* 10.4  NEUTROABS 8.3*  --   HGB 12.5 11.9*  HCT 38.9 38.3  MCV 87.0 87.6  PLT 390 377   Cardiac Enzymes: No results for input(s): CKTOTAL, CKMB, CKMBINDEX, TROPONINI in the last 168 hours. BNP: Invalid input(s): POCBNP CBG: No results for input(s): GLUCAP in the last 168 hours. HbA1C: Recent Labs    02/15/24 1944  HGBA1C 5.4   Urine analysis:    Component Value Date/Time   COLORURINE YELLOW 02/15/2024 1708   APPEARANCEUR CLEAR 02/15/2024 1708   LABSPEC 1.026 02/15/2024 1708   PHURINE 5.0 02/15/2024 1708   GLUCOSEU NEGATIVE 02/15/2024 1708   HGBUR NEGATIVE 02/15/2024 1708   BILIRUBINUR NEGATIVE 02/15/2024 1708   BILIRUBINUR neg 02/16/2013 1719   KETONESUR NEGATIVE 02/15/2024 1708   PROTEINUR 30 (A) 02/15/2024 1708   UROBILINOGEN 0.2 11/01/2014 1937   NITRITE NEGATIVE  02/15/2024 1708   LEUKOCYTESUR NEGATIVE 02/15/2024 1708   Sepsis Labs: @LABRCNTIP (procalcitonin:4,lacticidven:4) ) Recent Results (from the past 240 hours)  Blood culture (routine x 2)     Status: None (Preliminary result)   Collection Time: 02/15/24  5:51 PM   Specimen: Left Antecubital; Blood  Result Value Ref Range Status   Specimen Description LEFT ANTECUBITAL AEROBIC BOTTLE ONLY  Final   Special Requests   Final    Blood Culture results may not be optimal due to an inadequate volume of blood received in culture bottles   Culture   Final    NO GROWTH < 12 HOURS Performed at [facility], [address], [place], [place] [address]    Report Status PENDING  Incomplete  Blood culture (routine x 2)     Status: None (Preliminary result)   Collection Time: 02/15/24  6:02 PM   Specimen: BLOOD RIGHT ARM  Result Value Ref Range Status   Specimen Description   Final    BLOOD RIGHT ARM BOTTLES DRAWN AEROBIC AND ANAEROBIC   Special Requests Blood Culture adequate volume  Final   Culture   Final    NO GROWTH < 12 HOURS Performed at [facility], [address] [place], [place] [address]  Report Status PENDING  Incomplete     Scheduled Meds:  carvedilol   6.25 mg Oral BID WC   DULoxetine  20 mg Oral Daily   furosemide   40 mg Oral Daily   gabapentin  300 mg Oral BID   potassium chloride   20 mEq Oral Once   Continuous Infusions:  cefTRIAXone  (ROCEPHIN )  IV 2 g (02/16/24 1740)   vancomycin  200 mL/hr at 02/16/24 9485    Procedures/Studies: CT CHEST W CONTRAST Result Date: 02/15/2024 CLINICAL DATA:  The right axillary cellulitis with drainage EXAM: CT CHEST WITH CONTRAST TECHNIQUE: Multidetector CT imaging of the chest was performed during intravenous contrast administration. RADIATION DOSE REDUCTION: This exam was performed according to the departmental dose-optimization program which includes automated exposure control, adjustment of the mA and/or kV according to patient size and/or  use of iterative reconstruction technique. CONTRAST:  75mL OMNIPAQUE  IOHEXOL  300 MG/ML  SOLN COMPARISON:  11/10/2022 FINDINGS: Cardiovascular: Thoracic aorta and its branches are within normal limits. No cardiac enlargement is seen. The pulmonary artery as visualized shows no large central pulmonary embolus although not timed for embolus evaluation. Left chest wall port is noted in satisfactory position. Mediastinum/Nodes: Thoracic inlet is within normal limits. No hilar or mediastinal adenopathy is noted. Bilateral axillary adenopathy is noted slightly worse on the right than the left roughly similar to that seen on the prior exam. The degree of overlying skin thickening and inflammatory change has increased however consistent with the given clinical history of cellulitis. No subcutaneous abscess is noted at this time. The esophagus is within normal limits. Lungs/Pleura: Lungs are well aerated bilaterally. No focal infiltrate or sizable effusion is seen. Scattered small pulmonary nodules are again noted and stable from the prior exam. The largest of these lies in the left lower lobe best seen on image number 86 of series 3 measuring up to 5 mm. These of been stable over multiple previous exams dating back to 2021. Given the long-term stability, no further follow-up is recommended. Upper Abdomen: Fatty infiltration of the liver is noted. No other focal abnormality in the abdomen is seen. Musculoskeletal: No chest wall abnormality. No acute or significant osseous findings. IMPRESSION: Changes consistent with the known history of right axillary cellulitis with associated lymphadenopathy. No discrete subcutaneous abscess is seen. Scattered pulmonary nodule stable dating back to 2021 consistent with a benign etiology. No follow-up is recommended. Fatty liver. Electronically Signed   By: Oneil Devonshire M.D.   On: 02/15/2024 22:06   DG Chest 2 View Result Date: 02/15/2024 CLINICAL DATA:  Swollen lymph nodes chest pain  EXAM: CHEST - 2 VIEW COMPARISON:  11/03/2022 FINDINGS: Left-sided central venous catheter tip over the brachiocephalic confluence. No acute airspace disease, pleural effusion or pneumothorax. Stable cardiomediastinal silhouette with some central vascular congestion. IMPRESSION: Mild central vascular congestion. Electronically Signed   By: Luke Bun M.D.   On: 02/15/2024 16:59    Alm Schneider, DO  [facility]  If 7PM-7AM, please contact night-coverage [email or web address] Password [email or web address] 02/16/2024, 5:44 PM   LOS: 1 day

## 2024-02-16 NOTE — Consult Note (Signed)
 [facility] Consult  Reason for Consult: Right arm cellulitis, history of hidradenitis  Referring Physician:  Dr. Evonnie   Chief Complaint   arm infection     HPI: [name] is a 38 y.o. female with a history of hidradenitis in the left groin and right axilla with prior excision by dermatology in [place] in the left groin in 2017 prior to her diagnosis of lymphoma. She was diagnosed with lymphoma in 2017 and had treatment. She has had hidradenitis flares sporadically in the right axilla and uses clindamycin . She takes metformin. She has seen dermatology and has never been put on a biologic since her hidradenitis was not severe.  She went to her PCP yesterday and due to the cellulitis she was sent to the ED.   She was seen in the ED and started on Antibiotics. Dr. Jakie saw no drainable collection on US .  She feels like the area is improving. CT does not show the area.   Past Medical History:  Diagnosis Date   Acute on chronic systolic CHF (congestive heart failure) (HCC) 10/28/2019   a. EF 45-50% by echo in 09/2018 b. EF at 45% by echo in 10/2019   Anxiety    Asthma    had since childhood; has not had to use inhaler or nebulizer for 8-9 years. Been doing well.   Cervical lymphadenopathy    left groin lymph node, axillary lymph nodes:  seen on PET scan 02/2018   Dyspnea    Headache(784.0)    usually controlled c Tylenol  when not pregnant, but more painful headaches  during preg. due to hypertension.   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 2011   Iron deficiency 01/13/2017   Lymphoma, Hodgkin's (HCC)    Stage IV   Sleep apnea     Past Surgical History:  Procedure Laterality Date   ABDOMINAL HYSTERECTOMY  05/2017   ADENOIDECTOMY     age 25   BREAST RECONSTRUCTION  December 2001   breast reduction at age 46   FULGURATION OF BLADDER TUMOR N/A  07/23/2016   Procedure: CYSTOSCOPY, BIOPSY AND FULGURATION OF BLADDER;  Surgeon: Belvie LITTIE Clara, MD;  Location: [facility];  Service: Urology;  Laterality: N/A;   MASS EXCISION Right 04/07/2016   Procedure: EXCISION/BIOPSY SOFT TISSUE MASS RIGHT THIGH;  Surgeon: Oneil Budge, MD;  Location: [facility];  Service: General;  Laterality: Right;   port a cath placed     PORTACATH PLACEMENT Left 04/28/2016   Procedure: INSERTION PORT-A-CATH;  Surgeon: Oneil Budge, MD;  Location: [facility];  Service: General;  Laterality: Left;    Family History  Problem Relation Age of Onset   Hypertension Mother    Cancer Mother    Hypertension Father    Heart disease Maternal Grandmother    Diabetes Paternal Grandmother     Social History   Tobacco Use   Smoking status: Never   Smokeless tobacco: Never  Vaping Use   Vaping status: Never Used  Substance Use Topics   Alcohol use: Yes    Alcohol/week: 1.0 standard drink of alcohol    Types: 1 Glasses of wine per week   Drug use: No    Medications: I have reviewed the patient's current medications. Prior to Admission:  Medications Prior to Admission  Medication Sig Dispense Refill Last Dose/Taking   albuterol  (VENTOLIN  HFA) 108 (90 Base) MCG/ACT inhaler Inhale  1-2 puffs into the lungs every 6 (six) hours as needed for wheezing or shortness of breath.    Unknown   carvedilol  (COREG ) 6.25 MG tablet Take 1 tablet (6.25 mg total) by mouth 2 (two) times daily with a meal. 60 tablet 6 02/15/2024 at 11:00 AM   cholecalciferol (VITAMIN D3) 25 MCG (1000 UNIT) tablet Take 1,000 Units by mouth daily.   02/15/2024 Morning   DULoxetine HCl 40 MG CPEP Take 1 capsule by mouth daily.   02/15/2024 Morning   furosemide  (LASIX ) 40 MG tablet Take 1 tablet (40 mg total) by mouth daily. 90 tablet 1 02/14/2024   gabapentin (NEURONTIN) 300 MG capsule Take 300 mg by mouth 2 (two) times daily.   02/15/2024 Evening   lisinopril  (ZESTRIL ) 5 MG tablet TAKE 1 TABLET ONCE DAILY. (Patient  taking differently: Take 5 mg by mouth daily.) 90 tablet 1 02/15/2024 Morning   metFORMIN (GLUCOPHAGE-XR) 500 MG 24 hr tablet Take 500 mg by mouth 2 (two) times daily.   02/15/2024 Evening   pantoprazole  (PROTONIX ) 20 MG tablet Take 1 tablet (20 mg total) by mouth daily. (Patient taking differently: Take 20 mg by mouth daily as needed for heartburn or indigestion.) 90 tablet 1 Past Month   clindamycin  (CLEOCIN ) 300 MG capsule Take 1 capsule (300 mg total) by mouth in the morning and at bedtime. (Patient not taking: Reported on 02/15/2024) 28 capsule 0 Not Taking   Scheduled:  carvedilol   6.25 mg Oral BID WC   DULoxetine  20 mg Oral Daily   furosemide   40 mg Oral Daily   gabapentin  300 mg Oral BID   Continuous:  cefTRIAXone  (ROCEPHIN )  IV     vancomycin  200 mL/hr at 02/16/24 0514   PRN:acetaminophen  **OR** acetaminophen , ondansetron  **OR** ondansetron  (ZOFRAN ) IV, polyethylene glycol  No Known Allergies   ROS:  A comprehensive review of systems was negative except for: Musculoskeletal: positive for right axilla and upper arm cellulitis indurated   Blood pressure (!) 150/113, pulse 94, temperature 97.8 F (36.6 C), resp. rate 14, height 5' 4 (1.626 m), weight 131 kg, last menstrual period 02/02/2013, SpO2 98%. Physical Exam Vitals reviewed.  HENT:     Head: Normocephalic.     Nose: Nose normal.   Cardiovascular:     Rate and Rhythm: Normal rate.  Pulmonary:     Effort: Pulmonary effort is normal.  Abdominal:     General: There is no distension.     Palpations: Abdomen is soft.   Musculoskeletal:     Comments: Right axilla and upper arm with induration and cellulitis, no drainable collection noted   Skin:    General: Skin is warm.   Neurological:     General: No focal deficit present.     Mental Status: She is alert.     Results: Results for orders placed or performed during the hospital encounter of 02/15/24 (from the past 48 hours)  Urinalysis, w/ Reflex to Culture  (Infection Suspected) -Urine, Clean Catch     Status: Abnormal   Collection Time: 02/15/24  5:08 PM  Result Value Ref Range   Specimen Source URINE, CLEAN CATCH    Color, Urine YELLOW YELLOW   APPearance CLEAR CLEAR   Specific Gravity, Urine 1.026 1.005 - 1.030   pH 5.0 5.0 - 8.0   Glucose, UA NEGATIVE NEGATIVE mg/dL   Hgb urine dipstick NEGATIVE NEGATIVE   Bilirubin Urine NEGATIVE NEGATIVE   Ketones, ur NEGATIVE NEGATIVE mg/dL   Protein, ur 30 (A)  NEGATIVE mg/dL   Nitrite NEGATIVE NEGATIVE   Leukocytes,Ua NEGATIVE NEGATIVE   RBC / HPF 0-5 0 - 5 RBC/hpf   WBC, UA 0-5 0 - 5 WBC/hpf    Comment:        Reflex urine culture not performed if WBC <=10, OR if Squamous epithelial cells >5. If Squamous epithelial cells >5 suggest recollection.    Bacteria, UA NONE SEEN NONE SEEN   Squamous Epithelial / HPF 0-5 0 - 5 /HPF   Mucus PRESENT    Ca Oxalate Crys, UA PRESENT     Comment: Performed at [facility], [address], [place], [place] [address]  Pregnancy, urine     Status: None   Collection Time: 02/15/24  5:08 PM  Result Value Ref Range   Preg Test, Ur NEGATIVE NEGATIVE    Comment:        THE SENSITIVITY OF THIS METHODOLOGY IS >20 mIU/mL. Performed at [facility], [address], [place], [place] [address]   Lactic acid, plasma     Status: None   Collection Time: 02/15/24  5:51 PM  Result Value Ref Range   Lactic Acid, Venous 1.2 0.5 - 1.9 mmol/L    Comment: Performed at [facility], [address], [place], [place] [address]  Comprehensive metabolic panel     Status: Abnormal   Collection Time: 02/15/24  5:51 PM  Result Value Ref Range   Sodium 137 135 - 145 mmol/L   Potassium 3.5 3.5 - 5.1 mmol/L   Chloride 104 98 - 111 mmol/L   CO2 22 22 - 32 mmol/L   Glucose, Bld 89 70 - 99 mg/dL    Comment: Glucose reference range applies only to samples taken after fasting for at least 8 hours.   BUN 9 6 - 20 mg/dL   Creatinine, Ser 9.32 0.44 - 1.00 mg/dL   Calcium 9.1 8.9 -  89.6 mg/dL   Total Protein 7.0 6.5 - 8.1 g/dL   Albumin 3.2 (L) 3.5 - 5.0 g/dL   AST 21 15 - 41 U/L   ALT 21 0 - 44 U/L   Alkaline Phosphatase 38 38 - 126 U/L   Total Bilirubin 0.3 0.0 - 1.2 mg/dL   GFR, Estimated >39 >39 mL/min    Comment: (NOTE) Calculated using the CKD-EPI Creatinine Equation (2021)    Anion gap 11 5 - 15    Comment: Performed at [facility], [address] [place], [place] [address]  Blood culture (routine x 2)     Status: None (Preliminary result)   Collection Time: 02/15/24  5:51 PM   Specimen: Left Antecubital; Blood  Result Value Ref Range   Specimen Description LEFT ANTECUBITAL AEROBIC BOTTLE ONLY    Special Requests      Blood Culture results may not be optimal due to an inadequate volume of blood received in culture bottles   Culture      NO GROWTH < 12 HOURS Performed at [facility], [address] [place], [place] [address]    Report Status PENDING   Blood culture (routine x 2)     Status: None (Preliminary result)   Collection Time: 02/15/24  6:02 PM   Specimen: BLOOD RIGHT ARM  Result Value Ref Range   Specimen Description      BLOOD RIGHT ARM BOTTLES DRAWN AEROBIC AND ANAEROBIC   Special Requests Blood Culture adequate volume    Culture      NO GROWTH < 12 HOURS Performed at [facility], [address] [place], [place] [address]    Report Status PENDING   Lactic acid, plasma     Status: None   Collection Time: 02/15/24  7:44 PM  Result Value Ref Range   Lactic Acid, Venous 0.7 0.5 - 1.9 mmol/L    Comment: Performed at [facility], [address] [place], [place] [address]  CBC with Differential/Platelet     Status: Abnormal   Collection Time: 02/15/24  7:44 PM  Result Value Ref Range   WBC 11.9 (H) 4.0 - 10.5 K/uL   RBC 4.47 3.87 - 5.11 MIL/uL   Hemoglobin 12.5 12.0 - 15.0 g/dL   HCT 61.0 63.9 - 53.9 %   MCV 87.0 80.0 - 100.0 fL   MCH 28.0 26.0 - 34.0 pg   MCHC 32.1 30.0 - 36.0 g/dL   RDW 86.5 88.4 - 84.4 %   Platelets 390 150  - 400 K/uL   nRBC 0.0 0.0 - 0.2 %   Neutrophils Relative % 70 %   Neutro Abs 8.3 (H) 1.7 - 7.7 K/uL   Lymphocytes Relative 19 %   Lymphs Abs 2.3 0.7 - 4.0 K/uL   Monocytes Relative 8 %   Monocytes Absolute 0.9 0.1 - 1.0 K/uL   Eosinophils Relative 3 %   Eosinophils Absolute 0.4 0.0 - 0.5 K/uL   Basophils Relative 0 %   Basophils Absolute 0.1 0.0 - 0.1 K/uL   Immature Granulocytes 0 %   Abs Immature Granulocytes 0.04 0.00 - 0.07 K/uL    Comment: Performed at [facility], [address] [place], [place] [address]  Hemoglobin A1c     Status: None   Collection Time: 02/15/24  7:44 PM  Result Value Ref Range   Hgb A1c MFr Bld 5.4 4.8 - 5.6 %    Comment: (NOTE) Diagnosis of Diabetes The following HbA1c ranges recommended by the American Diabetes Association (ADA) may be used as an aid in the diagnosis of diabetes mellitus.  Hemoglobin             Suggested A1C NGSP%              Diagnosis  <5.7                   Non Diabetic  5.7-6.4                Pre-Diabetic  >6.4                   Diabetic  <7.0                   Glycemic control for                       adults with diabetes.     Mean Plasma Glucose 108.28 mg/dL    Comment: Performed at [facility], [address], [place], [place] [address]  HIV Antibody (routine testing w rflx)     Status: None   Collection Time: 02/16/24  4:24 AM  Result Value Ref Range   HIV Screen 4th Generation wRfx Non Reactive Non Reactive    Comment: Performed at [facility], [address] [place], [place] [address]  Basic metabolic panel     Status: Abnormal   Collection Time: 02/16/24  4:24 AM  Result Value Ref Range   Sodium 135 135 - 145 mmol/L   Potassium 3.4 (L) 3.5 - 5.1 mmol/L  Chloride 104 98 - 111 mmol/L   CO2 23 22 - 32 mmol/L   Glucose, Bld 112 (H) 70 - 99 mg/dL    Comment: Glucose reference range applies only to samples taken after fasting for at least 8 hours.   BUN 9 6 - 20 mg/dL   Creatinine, Ser 9.33  0.44 - 1.00 mg/dL   Calcium 8.6 (L) 8.9 - 10.3 mg/dL   GFR, Estimated >39 >39 mL/min    Comment: (NOTE) Calculated using the CKD-EPI Creatinine Equation (2021)    Anion gap 8 5 - 15    Comment: Performed at [facility], [address], [place], [place] [address]  CBC     Status: Abnormal   Collection Time: 02/16/24  4:24 AM  Result Value Ref Range   WBC 10.4 4.0 - 10.5 K/uL   RBC 4.37 3.87 - 5.11 MIL/uL   Hemoglobin 11.9 (L) 12.0 - 15.0 g/dL   HCT 61.6 63.9 - 53.9 %   MCV 87.6 80.0 - 100.0 fL   MCH 27.2 26.0 - 34.0 pg   MCHC 31.1 30.0 - 36.0 g/dL   RDW 86.6 88.4 - 84.4 %   Platelets 377 150 - 400 K/uL   nRBC 0.0 0.0 - 0.2 %    Comment: Performed at [facility], [address] [place], [place] [address]   Personally reviewed- I cannot see the right upper arm skin in the area of the hidradenitis/ cellulitis  CT CHEST W CONTRAST Result Date: 02/15/2024 CLINICAL DATA:  The right axillary cellulitis with drainage EXAM: CT CHEST WITH CONTRAST TECHNIQUE: Multidetector CT imaging of the chest was performed during intravenous contrast administration. RADIATION DOSE REDUCTION: This exam was performed according to the departmental dose-optimization program which includes automated exposure control, adjustment of the mA and/or kV according to patient size and/or use of iterative reconstruction technique. CONTRAST:  75mL OMNIPAQUE  IOHEXOL  300 MG/ML  SOLN COMPARISON:  11/10/2022 FINDINGS: Cardiovascular: Thoracic aorta and its branches are within normal limits. No cardiac enlargement is seen. The pulmonary artery as visualized shows no large central pulmonary embolus although not timed for embolus evaluation. Left chest wall port is noted in satisfactory position. Mediastinum/Nodes: Thoracic inlet is within normal limits. No hilar or mediastinal adenopathy is noted. Bilateral axillary adenopathy is noted slightly worse on the right than the left roughly similar to that seen on the prior exam. The degree  of overlying skin thickening and inflammatory change has increased however consistent with the given clinical history of cellulitis. No subcutaneous abscess is noted at this time. The esophagus is within normal limits. Lungs/Pleura: Lungs are well aerated bilaterally. No focal infiltrate or sizable effusion is seen. Scattered small pulmonary nodules are again noted and stable from the prior exam. The largest of these lies in the left lower lobe best seen on image number 86 of series 3 measuring up to 5 mm. These of been stable over multiple previous exams dating back to 2021. Given the long-term stability, no further follow-up is recommended. Upper Abdomen: Fatty infiltration of the liver is noted. No other focal abnormality in the abdomen is seen. Musculoskeletal: No chest wall abnormality. No acute or significant osseous findings. IMPRESSION: Changes consistent with the known history of right axillary cellulitis with associated lymphadenopathy. No discrete subcutaneous abscess is seen. Scattered pulmonary nodule stable dating back to 2021 consistent with a benign etiology. No follow-up is recommended. Fatty liver. Electronically Signed   By: Oneil Devonshire M.D.   On: 02/15/2024 22:06   DG  Chest 2 View Result Date: 02/15/2024 CLINICAL DATA:  Swollen lymph nodes chest pain EXAM: CHEST - 2 VIEW COMPARISON:  11/03/2022 FINDINGS: Left-sided central venous catheter tip over the brachiocephalic confluence. No acute airspace disease, pleural effusion or pneumothorax. Stable cardiomediastinal silhouette with some central vascular congestion. IMPRESSION: Mild central vascular congestion. Electronically Signed   By: Luke Bun M.D.   On: 02/15/2024 16:59     Assessment & Plan:  ARWILDA GEORGIA is a 38 y.o. female with right arm cellulitis with hidradenitis history.  She has fairly localized disease in the right axilla.  Discussed hidradenitis and the pathophysiology.   Antibiotics for cellulitis, likely home  tomorrow   We discussed the pathophysiology of hidradenitis and the likelihood that it involves blockages in hair follicles and sweat glands. We discussed that we do not know the exact cause but that this is what is suspected. We discussed that this is a spectrum of disease from small localized areas to extensive disease in the armpits, groin, between the breast and in other areas where one sweats.  One develops painful, blocked areas in these regions that can become infected and form sinus tracks or tunnels. These areas causing scarring and inflammation, and can leak pus and have an odor.  We discussed the risk factors such as obesity, smoking, metabolic syndrome/ diabetes.  There is also some link between genetics and immune system issues.    Given the spectrum of disease, some hidradenitis can improve with medical therapies and some need excision.  Excision of disease is best left for disease that is very well localized or that is so extensive and persistent that has failed to improve with other treatments.  Surgery for hidradenitis can be very disfiguring and painful. Large open wounds may be left in sensitive areas like the armpit and groin, and the hidradenitis can recur even after excision. Multiple excisions can be necessary as well as long-term wound care with months of packing and even skin grafts.   More recent studies are showing that medical treatments and lifestyle modifications may be more effective at treating this disease and cause less morbidity (pain and suffering) than a large surgical debridement.  We discussed exhausting these medical therapies including potentially biologics or immune modulators and discussion with a dermatologist prior to pursuing any surgical intervention.   Discussed that right now with the cellulitis, we should not do any excision but discussed options for the future with the localized disease.   All questions were answered to the satisfaction of the  patient.   Manuelita JAYSON Pander 02/16/2024, 2:17 PM

## 2024-02-16 NOTE — Progress Notes (Signed)
 Pt has Bipap set up at bedside and can place on/off per home use.Pt instructed to call for further assistance.

## 2024-02-17 ENCOUNTER — Encounter (HOSPITAL_COMMUNITY): Payer: Self-pay | Admitting: Internal Medicine

## 2024-02-17 LAB — CBC
HCT: 37.7 % (ref 36.0–46.0)
Hemoglobin: 12.1 g/dL (ref 12.0–15.0)
MCH: 28.1 pg (ref 26.0–34.0)
MCHC: 32.1 g/dL (ref 30.0–36.0)
MCV: 87.7 fL (ref 80.0–100.0)
Platelets: 395 10*3/uL (ref 150–400)
RBC: 4.3 MIL/uL (ref 3.87–5.11)
RDW: 13.4 % (ref 11.5–15.5)
WBC: 10.1 10*3/uL (ref 4.0–10.5)
nRBC: 0 % (ref 0.0–0.2)

## 2024-02-17 LAB — BASIC METABOLIC PANEL WITH GFR
Anion gap: 10 (ref 5–15)
BUN: 10 mg/dL (ref 6–20)
CO2: 27 mmol/L (ref 22–32)
Calcium: 9.3 mg/dL (ref 8.9–10.3)
Chloride: 100 mmol/L (ref 98–111)
Creatinine, Ser: 0.68 mg/dL (ref 0.44–1.00)
GFR, Estimated: >60 mL/min (ref >60)
Glucose, Bld: 103 mg/dL — ABNORMAL HIGH (ref 70–99)
Potassium: 3.6 mmol/L (ref 3.5–5.1)
Sodium: 137 mmol/L (ref 135–145)

## 2024-02-17 LAB — MAGNESIUM: Magnesium: 1.9 mg/dL (ref 1.7–2.4)

## 2024-02-17 MED ORDER — VANCOMYCIN HCL IN DEXTROSE 1-5 GM/200ML-% IV SOLN: 1000.0000 mg | Freq: Two times a day (BID) | INTRAVENOUS | Status: DC

## 2024-02-17 MED ORDER — VANCOMYCIN HCL 1250 MG/250ML IV SOLN: 1250.0000 mg | Freq: Two times a day (BID) | INTRAVENOUS | Status: DC

## 2024-02-17 MED ORDER — DOXYCYCLINE HYCLATE 100 MG PO TABS: 100.0000 mg | ORAL_TABLET | Freq: Two times a day (BID) | ORAL | 0 refills | Status: AC

## 2024-02-17 NOTE — Discharge Summary (Signed)
 Physician Discharge Summary  [name] FMW:[id number] DOB: Dec 06, 1985 DOA: 02/15/2024  PCP: [name], MD  Admit date: 02/15/2024  Discharge date: 02/17/2024  Admitted From:Home  Disposition:  Home  Recommendations for Outpatient Follow-up:  Follow up with PCP in 1-2 weeks Follow-up with Dr. Mavis in 2 weeks which has been scheduled Continue on doxycycline  for 7 more days to complete course of treatment for cellulitis Continue other home medications as prior  Home Health: None  Equipment/Devices: None  Discharge Condition:Stable  CODE STATUS: Full  Diet recommendation: Heart Healthy  Brief/Interim Summary: 38 y/o female with a history of hypertension, hidradenitis suppurative, nonischemic cardiomyopathy, Hodgkin's lymphoma, morbid obesity, OSA, anxiety with complaints of swelling pain redness and drainage from her right arm that started 5 days ago.  Patient was admitted for sepsis, POA secondary to right arm/axillary cellulitis and underwent further CT chest imaging with no subcutaneous abscess noted.  Patient was evaluated by general surgery with no need for excision noted at this time.  She appears to have responded well to IV antibiotics and will need further treatment course for the next 7 days with oral doxycycline  as prescribed.  She will have close follow-up with general surgery in the next 2 weeks.  No other acute events or concerns noted.  Discharge Diagnoses:  Principal Problem:   Right arm cellulitis Active Problems:   Hodgkin lymphoma, nodular lymphocyte predominance (HCC)   Sepsis (HCC)   Hypertension   Sleep apnea   Nonischemic cardiomyopathy (HCC)   Hidradenitis   Cellulitis of right upper extremity  Principal discharge diagnosis: Sepsis, POA, secondary to right arm cellulitis.  Discharge Instructions  Discharge Instructions     Diet - low sodium heart healthy   Complete by: As directed    If the dressing is still on your incision site  when you go home, remove it on the third day after your surgery date. Remove dressing if it begins to fall off, or if it is dirty or damaged before the third day.   Complete by: As directed    Increase activity slowly   Complete by: As directed       Allergies as of 02/17/2024   No Known Allergies      Medication List     STOP taking these medications    clindamycin  300 MG capsule Commonly known as: CLEOCIN        TAKE these medications    albuterol  108 (90 Base) MCG/ACT inhaler Commonly known as: VENTOLIN  HFA Inhale 1-2 puffs into the lungs every 6 (six) hours as needed for wheezing or shortness of breath.   carvedilol  6.25 MG tablet Commonly known as: COREG  Take 1 tablet (6.25 mg total) by mouth 2 (two) times daily with a meal.   cholecalciferol 25 MCG (1000 UNIT) tablet Commonly known as: VITAMIN D3 Take 1,000 Units by mouth daily.   doxycycline  100 MG tablet Commonly known as: VIBRA -TABS Take 1 tablet (100 mg total) by mouth 2 (two) times daily for 7 days.   DULoxetine HCl 40 MG Cpep Take 1 capsule by mouth daily.   furosemide  40 MG tablet Commonly known as: LASIX  Take 1 tablet (40 mg total) by mouth daily.   gabapentin 300 MG capsule Commonly known as: NEURONTIN Take 300 mg by mouth 2 (two) times daily.   lisinopril  5 MG tablet Commonly known as: ZESTRIL  TAKE 1 TABLET ONCE DAILY.   metFORMIN 500 MG 24 hr tablet Commonly known as: GLUCOPHAGE-XR Take 500 mg by mouth 2 (  two) times daily.   pantoprazole  20 MG tablet Commonly known as: Protonix  Take 1 tablet (20 mg total) by mouth daily. What changed:  when to take this reasons to take this               Discharge Care Instructions  (From admission, onward)           Start     Ordered   02/17/24 0000  If the dressing is still on your incision site when you go home, remove it on the third day after your surgery date. Remove dressing if it begins to fall off, or if it is dirty or damaged  before the third day.        02/17/24 1020            Follow-up Information     [name], MD. Schedule an appointment as soon as possible for a visit in 1 week(s).   Specialty: Internal Medicine Contact information: [address] [place] [address] [phone]         [name], MD. Go in 2 week(s).   Specialty: General Surgery Contact information: [id number] [place] [address] [phone]                No Known Allergies  Consultations: General surgery   Procedures/Studies: CT CHEST W CONTRAST Result Date: 02/15/2024 CLINICAL DATA:  The right axillary cellulitis with drainage EXAM: CT CHEST WITH CONTRAST TECHNIQUE: Multidetector CT imaging of the chest was performed during intravenous contrast administration. RADIATION DOSE REDUCTION: This exam was performed according to the departmental dose-optimization program which includes automated exposure control, adjustment of the mA and/or kV according to patient size and/or use of iterative reconstruction technique. CONTRAST:  75mL OMNIPAQUE  IOHEXOL  300 MG/ML  SOLN COMPARISON:  11/10/2022 FINDINGS: Cardiovascular: Thoracic aorta and its branches are within normal limits. No cardiac enlargement is seen. The pulmonary artery as visualized shows no large central pulmonary embolus although not timed for embolus evaluation. Left chest wall port is noted in satisfactory position. Mediastinum/Nodes: Thoracic inlet is within normal limits. No hilar or mediastinal adenopathy is noted. Bilateral axillary adenopathy is noted slightly worse on the right than the left roughly similar to that seen on the prior exam. The degree of overlying skin thickening and inflammatory change has increased however consistent with the given clinical history of cellulitis. No subcutaneous abscess is noted at this time. The esophagus is within normal limits. Lungs/Pleura: Lungs are well aerated bilaterally. No focal  infiltrate or sizable effusion is seen. Scattered small pulmonary nodules are again noted and stable from the prior exam. The largest of these lies in the left lower lobe best seen on image number 86 of series 3 measuring up to 5 mm. These of been stable over multiple previous exams dating back to 2021. Given the long-term stability, no further follow-up is recommended. Upper Abdomen: Fatty infiltration of the liver is noted. No other focal abnormality in the abdomen is seen. Musculoskeletal: No chest wall abnormality. No acute or significant osseous findings. IMPRESSION: Changes consistent with the known history of right axillary cellulitis with associated lymphadenopathy. No discrete subcutaneous abscess is seen. Scattered pulmonary nodule stable dating back to 2021 consistent with a benign etiology. No follow-up is recommended. Fatty liver. Electronically Signed   By: Anes Devonshire M.D.   On: 02/15/2024 22:06   DG Chest 2 View Result Date: 02/15/2024 CLINICAL DATA:  Swollen lymph nodes chest pain EXAM: CHEST - 2 VIEW COMPARISON:  11/03/2022 FINDINGS: Left-sided central venous catheter tip over the brachiocephalic confluence. No acute airspace disease, pleural effusion or pneumothorax. Stable cardiomediastinal silhouette with some central vascular congestion. IMPRESSION: Mild central vascular congestion. Electronically Signed   By: Luke Bun M.D.   On: 02/15/2024 16:59     Discharge Exam: Vitals:   02/16/24 1958 02/17/24 0459  BP: (!) 147/97 122/89  Pulse: (!) 101 84  Resp: 20 20  Temp: 98.9 F (37.2 C) 97.7 F (36.5 C)  SpO2: 100% 100%   Vitals:   02/16/24 0818 02/16/24 1336 02/16/24 1958 02/17/24 0459  BP: (!) 134/91 (!) 150/113 (!) 147/97 122/89  Pulse:  94 (!) 101 84  Resp:   20 20  Temp:  97.8 F (36.6 C) 98.9 F (37.2 C) 97.7 F (36.5 C)  TempSrc:   Oral Oral  SpO2:  98% 100% 100%  Weight:      Height:        General: Pt is alert, awake, not in acute distress, morbidly  obese Cardiovascular: RRR, S1/S2 +, no rubs, no gallops Respiratory: CTA bilaterally, no wheezing, no rhonchi Abdominal: Soft, NT, ND, bowel sounds + Extremities: no edema, no cyanosis    The results of significant diagnostics from this hospitalization (including imaging, microbiology, ancillary and laboratory) are listed below for reference.     Microbiology: Recent Results (from the past 240 hours)  Blood culture (routine x 2)     Status: None (Preliminary result)   Collection Time: 02/15/24  5:51 PM   Specimen: Left Antecubital; Blood  Result Value Ref Range Status   Specimen Description LEFT ANTECUBITAL AEROBIC BOTTLE ONLY  Final   Special Requests   Final    Blood Culture results may not be optimal due to an inadequate volume of blood received in culture bottles   Culture   Final    NO GROWTH 2 DAYS Performed at [facility], [address] [place], [place] [address]    Report Status PENDING  Incomplete  Blood culture (routine x 2)     Status: None (Preliminary result)   Collection Time: 02/15/24  6:02 PM   Specimen: BLOOD RIGHT ARM  Result Value Ref Range Status   Specimen Description   Final    BLOOD RIGHT ARM BOTTLES DRAWN AEROBIC AND ANAEROBIC   Special Requests Blood Culture adequate volume  Final   Culture   Final    NO GROWTH 2 DAYS Performed at [facility], [address], [place], [place] [address]    Report Status PENDING  Incomplete     Labs: BNP (last 3 results) No results for input(s): BNP in the last 8760 hours. Basic Metabolic Panel: Recent Labs  Lab 02/15/24 1751 02/16/24 0424 02/17/24 0447  NA 137 135 137  K 3.5 3.4* 3.6  CL 104 104 100  CO2 22 23 27   GLUCOSE 89 112* 103*  BUN 9 9 10   CREATININE 0.67 0.66 0.68  CALCIUM 9.1 8.6* 9.3  MG  --   --  1.9   Liver Function Tests: Recent Labs  Lab 02/15/24 1751  AST 21  ALT 21  ALKPHOS 38  BILITOT 0.3  PROT 7.0  ALBUMIN 3.2*   No results for input(s): LIPASE, AMYLASE in the last  168 hours. No results for input(s): AMMONIA in the last 168 hours. CBC: Recent Labs  Lab 02/15/24 1944 02/16/24 0424 02/17/24 0447  WBC 11.9* 10.4 10.1  NEUTROABS 8.3*  --   --   HGB 12.5 11.9* 12.1  HCT 38.9 38.3 37.7  MCV 87.0 87.6 87.7  PLT 390 377 395   Cardiac Enzymes: No results for input(s): CKTOTAL, CKMB, CKMBINDEX, TROPONINI in the last 168 hours. BNP: Invalid input(s): POCBNP CBG: No results for input(s): GLUCAP in the last 168 hours. D-Dimer No results for input(s): DDIMER in the last 72 hours. Hgb A1c Recent Labs    02/15/24 1944  HGBA1C 5.4   Lipid Profile No results for input(s): CHOL, HDL, LDLCALC, TRIG, CHOLHDL, LDLDIRECT in the last 72 hours. Thyroid  function studies No results for input(s): TSH, T4TOTAL, T3FREE, THYROIDAB in the last 72 hours.  Invalid input(s): FREET3 Anemia work up No results for input(s): VITAMINB12, FOLATE, FERRITIN, TIBC, IRON, RETICCTPCT in the last 72 hours. Urinalysis    Component Value Date/Time   COLORURINE YELLOW 02/15/2024 1708   APPEARANCEUR CLEAR 02/15/2024 1708   LABSPEC 1.026 02/15/2024 1708   PHURINE 5.0 02/15/2024 1708   GLUCOSEU NEGATIVE 02/15/2024 1708   HGBUR NEGATIVE 02/15/2024 1708   BILIRUBINUR NEGATIVE 02/15/2024 1708   BILIRUBINUR neg 02/16/2013 1719   KETONESUR NEGATIVE 02/15/2024 1708   PROTEINUR 30 (A) 02/15/2024 1708   UROBILINOGEN 0.2 11/01/2014 1937   NITRITE NEGATIVE 02/15/2024 1708   LEUKOCYTESUR NEGATIVE 02/15/2024 1708   Sepsis Labs Recent Labs  Lab 02/15/24 1944 02/16/24 0424 02/17/24 0447  WBC 11.9* 10.4 10.1   Microbiology Recent Results (from the past 240 hours)  Blood culture (routine x 2)     Status: None (Preliminary result)   Collection Time: 02/15/24  5:51 PM   Specimen: Left Antecubital; Blood  Result Value Ref Range Status   Specimen Description LEFT ANTECUBITAL AEROBIC BOTTLE ONLY  Final   Special Requests   Final     Blood Culture results may not be optimal due to an inadequate volume of blood received in culture bottles   Culture   Final    NO GROWTH 2 DAYS Performed at [facility], [address], [place], [place] [address]    Report Status PENDING  Incomplete  Blood culture (routine x 2)     Status: None (Preliminary result)   Collection Time: 02/15/24  6:02 PM   Specimen: BLOOD RIGHT ARM  Result Value Ref Range Status   Specimen Description   Final    BLOOD RIGHT ARM BOTTLES DRAWN AEROBIC AND ANAEROBIC   Special Requests Blood Culture adequate volume  Final   Culture   Final    NO GROWTH 2 DAYS Performed at [facility], [address], [place], [place] [address]    Report Status PENDING  Incomplete     Time coordinating discharge: 35 minutes  SIGNED:   Adron JONETTA Fairly, DO [facility] 02/17/2024, 10:21 AM  If 7PM-7AM, please contact night-coverage [email or web address]

## 2024-02-17 NOTE — Plan of Care (Signed)

## 2024-02-20 LAB — CULTURE, BLOOD (ROUTINE X 2)
Culture: NO GROWTH
Culture: NO GROWTH
Special Requests: ADEQUATE

## 2024-03-01 ENCOUNTER — Encounter: Payer: Self-pay | Admitting: General Surgery

## 2024-03-01 ENCOUNTER — Ambulatory Visit: Admitting: General Surgery

## 2024-03-01 VITALS — BP 122/87 | HR 87 | Temp 98.3°F | Resp 16 | Ht 64.0 in | Wt 283.0 lb

## 2024-03-01 DIAGNOSIS — L732 Hidradenitis suppurativa: Secondary | ICD-10-CM

## 2024-03-01 NOTE — Progress Notes (Signed)
 Subjective:     [name]  Patient here for follow-up, status post cellulitis with mild hidradenitis of the right axilla and right arm.  There was no surgical drainage needed as no abscess was present.  Patient was started on antibiotics.  She has since finished her antibiotics and she states the cellulitis is much improved.  She has had no drainage. Objective:    BP 122/87   Pulse 87   Temp 98.3 F (36.8 C) (Oral)   Resp 16   Ht 5' 4 (1.626 m)   Wt 283 lb (128.4 kg)   LMP 02/02/2013   SpO2 93%   BMI 48.58 kg/m   General:  alert, cooperative, and no distress  Right arm and axilla with no significant induration or erythema.  No drainage is noted.  She also had an area in the posterior neck which was a resolved sebaceous cyst.     Assessment:    Hidradenitis of the right axilla with mild cellulitis    Plan:  I told the patient that she may need follow-up with dermatology concerning her recurrent episodes of hidradenitis.  She understands and agrees.  Follow-up here as needed.

## 2024-07-30 ENCOUNTER — Encounter: Payer: Self-pay | Admitting: General Surgery

## 2024-07-31 ENCOUNTER — Encounter (HOSPITAL_COMMUNITY): Payer: Self-pay

## 2024-07-31 ENCOUNTER — Emergency Department (HOSPITAL_COMMUNITY)
Admission: EM | Admit: 2024-07-31 | Discharge: 2024-07-31 | Disposition: A | Discharge status: Home / Self Care | Attending: Emergency Medicine | Admitting: Emergency Medicine

## 2024-07-31 DIAGNOSIS — L0211 Cutaneous abscess of neck: Secondary | ICD-10-CM

## 2024-07-31 LAB — CBC WITH DIFFERENTIAL/PLATELET
Abs Immature Granulocytes: 0.03 K/uL (ref 0.00–0.07)
Basophils Absolute: 0 K/uL (ref 0.0–0.1)
Basophils Relative: 1 %
Eosinophils Absolute: 0.2 K/uL (ref 0.0–0.5)
Eosinophils Relative: 3 %
HCT: 40.1 % (ref 36.0–46.0)
Hemoglobin: 12.4 g/dL (ref 12.0–15.0)
Immature Granulocytes: 0 %
Lymphocytes Relative: 21 %
Lymphs Abs: 1.9 K/uL (ref 0.7–4.0)
MCH: 26.8 pg (ref 26.0–34.0)
MCHC: 30.9 g/dL (ref 30.0–36.0)
MCV: 86.8 fL (ref 80.0–100.0)
Monocytes Absolute: 0.6 K/uL (ref 0.1–1.0)
Monocytes Relative: 7 %
Neutro Abs: 6 K/uL (ref 1.7–7.7)
Neutrophils Relative %: 68 %
Platelets: 327 K/uL (ref 150–400)
RBC: 4.62 MIL/uL (ref 3.87–5.11)
RDW: 13.4 % (ref 11.5–15.5)
WBC: 8.7 K/uL (ref 4.0–10.5)
nRBC: 0 % (ref 0.0–0.2)

## 2024-07-31 LAB — BASIC METABOLIC PANEL WITH GFR
Anion gap: 11 (ref 5–15)
BUN: 12 mg/dL (ref 6–20)
CO2: 24 mmol/L (ref 22–32)
Calcium: 9.7 mg/dL (ref 8.9–10.3)
Chloride: 105 mmol/L (ref 98–111)
Creatinine, Ser: 0.72 mg/dL (ref 0.44–1.00)
GFR, Estimated: >60 mL/min (ref >60)
Glucose, Bld: 109 mg/dL — ABNORMAL HIGH (ref 70–99)
Potassium: 3.8 mmol/L (ref 3.5–5.1)
Sodium: 140 mmol/L (ref 135–145)

## 2024-07-31 LAB — PRO BRAIN NATRIURETIC PEPTIDE: Pro Brain Natriuretic Peptide: 140 pg/mL (ref <300.0)

## 2024-07-31 MED ORDER — OXYCODONE-ACETAMINOPHEN 5-325 MG PO TABS
2.0000 | ORAL_TABLET | Freq: Once | ORAL | Status: AC
  Administered 2024-07-31: 2 via ORAL
  Filled 2024-07-31: qty 2

## 2024-07-31 MED ORDER — LIDOCAINE HCL (PF) 1 % IJ SOLN
10.0000 mL | INTRAMUSCULAR | Status: DC
  Administered 2024-07-31: 10 mL

## 2024-07-31 MED ORDER — LIDOCAINE HCL (PF) 1 % IJ SOLN
10.0000 mL | Freq: Once | INTRAMUSCULAR | Status: AC
  Administered 2024-07-31: 10 mL
  Filled 2024-07-31: qty 10

## 2024-07-31 NOTE — ED Notes (Signed)
  Pt teaching provided on medications that may cause drowsiness. Pt instructed not to drive or operate heavy machinery while taking the prescribed medication. Pt verbalized understanding.   Pt provided discharge instructions and prescription information. Pt was given the opportunity to ask questions and questions were answered.

## 2024-07-31 NOTE — ED Triage Notes (Signed)
 Pt c/o flare up of hidradenitis suppurative on posterior neck, swelling in L arm, and SOB.  Pain score 10/10.  Hx of CHF and complaint w/ medications.

## 2024-07-31 NOTE — ED Provider Notes (Signed)
 [facility] EMERGENCY DEPARTMENT AT [facility] Provider Note   CSN: [id number] Arrival date & time: 07/31/24  1337     Patient presents with: Arm Swelling and Cyst   [name] is a 38 y.o. female patient with history of hidradenitis suppurativa, Hodgkin's lymphoma in remission who presents to the emergency department today for further evaluation of swelling in the neck which has been present for the last couple of days.  Patient states this is one of the spots which she gets her HS flares.  She is also complaining of left upper extremity swelling and warmth.  She states this feels similar to when she was admitted back in June.  Arms been swollen for the last week.  Patient is concerned this related to her CHF but she has been compliant with her medications.  She denies any fever, chills, nausea, vomiting, diarrhea.   HPI     Prior to Admission medications  Medication Sig Start Date End Date Taking? Authorizing Provider  albuterol  (VENTOLIN  HFA) 108 (90 Base) MCG/ACT inhaler Inhale 1-2 puffs into the lungs every 6 (six) hours as needed for wheezing or shortness of breath.  12/03/17   [provider]  carvedilol  (COREG ) 6.25 MG tablet Take 1 tablet (6.25 mg total) by mouth 2 (two) times daily with a meal. 03/14/20   Strader, Brittany M, PA-C  cholecalciferol (VITAMIN D3) 25 MCG (1000 UNIT) tablet Take 1,000 Units by mouth daily.    [provider]  DULoxetine  HCl 40 MG CPEP Take 1 capsule by mouth daily. 10/30/22   [provider]  furosemide  (LASIX ) 40 MG tablet Take 1 tablet (40 mg total) by mouth daily. 06/20/20   Strader, Laymon HERO, PA-C  gabapentin  (NEURONTIN ) 300 MG capsule Take 300 mg by mouth 2 (two) times daily. 02/13/24   [provider]  lisinopril  (ZESTRIL ) 5 MG tablet TAKE 1 TABLET ONCE DAILY. Patient taking differently: Take 5 mg by mouth daily. 01/02/20   Koneswaran, Suresh A, MD  metFORMIN (GLUCOPHAGE-XR) 500 MG 24 hr tablet  Take 500 mg by mouth 2 (two) times daily. 12/20/23   [provider]  pantoprazole  (PROTONIX ) 20 MG tablet Take 1 tablet (20 mg total) by mouth daily. Patient taking differently: Take 20 mg by mouth daily as needed for heartburn or indigestion. 11/20/22   Geofm Delon BRAVO, NP    Allergies: Patient has no known allergies.    Review of Systems  All other systems reviewed and are negative.   Updated Vital Signs BP 133/87 (BP Location: Right Arm)   Pulse 95   Temp 98.2 F (36.8 C) (Oral)   Resp 20   Ht 5' 4 (1.626 m)   Wt 127 kg   LMP 02/02/2013   SpO2 99%   BMI 48.06 kg/m   Physical Exam Vitals and nursing note reviewed.  Constitutional:      General: She is not in acute distress.    Appearance: Normal appearance.  HENT:     Head: Normocephalic and atraumatic.  Eyes:     General:        Right eye: No discharge.        Left eye: No discharge.  Neck:     Comments: 6.5 cm swelling with obvious pustule on the posterior aspect of the neck. Cardiovascular:     Comments: Regular rate and rhythm.  S1/S2 are distinct without any evidence of murmur, rubs, or gallops.  Radial pulses are 2+ bilaterally.  Dorsalis pedis pulses  are 2+ bilaterally.  No evidence of pedal edema. Pulmonary:     Comments: Clear to auscultation bilaterally.  Normal effort.  No respiratory distress.  No evidence of wheezes, rales, or rhonchi heard throughout. Abdominal:     General: Abdomen is flat. Bowel sounds are normal. There is no distension.     Tenderness: There is no abdominal tenderness. There is no guarding or rebound.  Musculoskeletal:        General: Normal range of motion.     Cervical back: Neck supple.     Comments: No obvious swelling to the left upper extremity.  Slightly more warm to palpation over the forearm in comparison to the right.  2+ radial pulses are equal and normal.  Skin:    General: Skin is warm and dry.     Findings: No rash.  Neurological:     General: No focal  deficit present.     Mental Status: She is alert.  Psychiatric:        Mood and Affect: Mood normal.        Behavior: Behavior normal.     (all labs ordered are listed, but only abnormal results are displayed) Labs Reviewed  BASIC METABOLIC PANEL WITH GFR - Abnormal; Notable for the following components:      Result Value   Glucose, Bld 109 (*)    All other components within normal limits  CBC WITH DIFFERENTIAL/PLATELET  PRO BRAIN NATRIURETIC PEPTIDE    EKG: None  Radiology: No results found.   .Incision and Drainage  Date/Time: 07/31/2024 2:58 PM  Performed by: Theotis Cameron HERO, PA-C Authorized by: Theotis Cameron HERO, PA-C   Consent:    Consent obtained:  Verbal   Consent given by:  Patient   Risks discussed:  Bleeding   Alternatives discussed:  No treatment Universal protocol:    Procedure explained and questions answered to patient or proxy's satisfaction: yes     Relevant documents present and verified: yes     Test results available : yes     Imaging studies available: yes     Required blood products, implants, devices, and special equipment available: yes     Site/side marked: no     Immediately prior to procedure, a time out was called: no     Patient identity confirmed:  Verbally with patient and arm band Location:    Type:  Abscess   Size:  6.5cm   Location:  Neck   Neck location:  L posterior Pre-procedure details:    Skin preparation:  Povidone-iodine Sedation:    Sedation type:  None Anesthesia:    Anesthesia method:  Local infiltration   Local anesthetic:  10 mL lidocaine  (PF) 1 % Procedure type:    Complexity:  Complex Procedure details:    Ultrasound guidance: yes     Needle aspiration: no     Incision types:  Single straight   Incision depth:  Dermal   Wound management:  Probed and deloculated   Drainage:  Purulent and bloody   Drainage amount:  Moderate   Wound treatment:  Wound left open   Packing materials:  None Post-procedure  details:    Procedure completion:  Tolerated well, no immediate complications    Medications Ordered in the ED  lidocaine  (PF) (XYLOCAINE ) 1 % injection 10 mL (10 mLs Infiltration Given by Other 07/31/24 1416)    Clinical Course as of 07/31/24 1533  Sun Jul 31, 2024  1527 CBC with Differential Negative [CF]  1527 Basic metabolic panel(!) Negative. [CF]  1527 Pro Brain natriuretic peptide Negative. [CF]  1528 On repeat evaluation, patient is feeling much better with regards to the neck abscess.  Her left arm is no longer warm.  I wonder if she was having some reactive lymphadenopathy considering that the abscess is on the left part of the neck.  Patient does not have a white count and is afebrile.  She is overall nonseptic appearing.  I think that we could likely watch and wait which patient is agreeable with.  Will plan to discharge home. [CF]    Clinical Course User Index [CF] Theotis Cameron HERO, PA-C    Medical Decision Making TALLULAH HOSMAN is a 38 y.o. female patient who presents to the emergency department today for further evaluation of a neck abscess.  This is likely secondary to hidradenitis suppurativa.  We drained this at the bedside.  Patient was complaining of some left upper extremity swelling.  It is equal in comparison to the right arm but it was slightly more warm.  As highlighted in the ED course, on further evaluation arm does not appear to be significantly warm or erythematous to palpation.  Her white count is normal today.  We performed incision and drainage over the neck abscess.  You can see procedure note for further detail.  She is afebrile and not tachycardic.  She is overall nonseptic appearing.  She is likely safe for discharge at this time.   Amount and/or Complexity of Data Reviewed Labs: ordered. Decision-making details documented in ED Course.  Risk Prescription drug management.     Final diagnoses:  Neck abscess    ED Discharge Orders      None          Theotis Cameron [place], [place] 07/31/24 1533    Suzette Pac, MD 08/01/24 1140

## 2024-07-31 NOTE — Discharge Instructions (Signed)
 As we discussed this is likely due to your HS. Your blood work looked excellent. I have a low suspicion for any infection in your arm at this time. I would like for you to follow up with your primary car doctor for further evaluation. Please return to the ED for any worsening symptoms.
